# Patient Record
Sex: Male | Born: 1950 | Race: White | Hispanic: No | Marital: Married | Smoking: Current every day smoker
Health system: Southern US, Community
[De-identification: ages and names within clinical notes are randomized; demographics above are authoritative.]

## PROBLEM LIST (undated history)

## (undated) DIAGNOSIS — N2889 Other specified disorders of kidney and ureter: Secondary | ICD-10-CM

## (undated) DIAGNOSIS — J449 Chronic obstructive pulmonary disease, unspecified: Secondary | ICD-10-CM

## (undated) DIAGNOSIS — C6290 Malignant neoplasm of unspecified testis, unspecified whether descended or undescended: Secondary | ICD-10-CM

## (undated) DIAGNOSIS — R0600 Dyspnea, unspecified: Secondary | ICD-10-CM

## (undated) DIAGNOSIS — M1990 Unspecified osteoarthritis, unspecified site: Secondary | ICD-10-CM

## (undated) DIAGNOSIS — C7492 Malignant neoplasm of unspecified part of left adrenal gland: Secondary | ICD-10-CM

## (undated) DIAGNOSIS — T8859XA Other complications of anesthesia, initial encounter: Secondary | ICD-10-CM

## (undated) DIAGNOSIS — T4145XA Adverse effect of unspecified anesthetic, initial encounter: Secondary | ICD-10-CM

## (undated) DIAGNOSIS — G479 Sleep disorder, unspecified: Secondary | ICD-10-CM

## (undated) DIAGNOSIS — Z9289 Personal history of other medical treatment: Secondary | ICD-10-CM

## (undated) DIAGNOSIS — C649 Malignant neoplasm of unspecified kidney, except renal pelvis: Secondary | ICD-10-CM

## (undated) DIAGNOSIS — K5090 Crohn's disease, unspecified, without complications: Secondary | ICD-10-CM

## (undated) DIAGNOSIS — C3490 Malignant neoplasm of unspecified part of unspecified bronchus or lung: Secondary | ICD-10-CM

## (undated) DIAGNOSIS — C642 Malignant neoplasm of left kidney, except renal pelvis: Secondary | ICD-10-CM

## (undated) DIAGNOSIS — G4730 Sleep apnea, unspecified: Secondary | ICD-10-CM

## (undated) HISTORY — PX: HERNIA REPAIR: SHX51

## (undated) HISTORY — PX: APPENDECTOMY: SHX54

## (undated) HISTORY — DX: Malignant neoplasm of unspecified testis, unspecified whether descended or undescended: C62.90

## (undated) HISTORY — DX: Crohn's disease, unspecified, without complications: K50.90

## (undated) HISTORY — PX: COLONOSCOPY: SHX174

## (undated) HISTORY — DX: Malignant neoplasm of left kidney, except renal pelvis: C64.2

---

## 1998-01-06 DIAGNOSIS — C6290 Malignant neoplasm of unspecified testis, unspecified whether descended or undescended: Secondary | ICD-10-CM

## 1998-01-06 HISTORY — DX: Malignant neoplasm of unspecified testis, unspecified whether descended or undescended: C62.90

## 1998-01-06 HISTORY — PX: OTHER SURGICAL HISTORY: SHX169

## 1998-10-02 ENCOUNTER — Ambulatory Visit (HOSPITAL_COMMUNITY): Admission: RE | Admit: 1998-10-02 | Discharge: 1998-10-02 | Payer: Self-pay | Admitting: *Deleted

## 1998-10-23 ENCOUNTER — Encounter: Payer: Self-pay | Admitting: Urology

## 1998-10-24 ENCOUNTER — Ambulatory Visit (HOSPITAL_COMMUNITY): Admission: RE | Admit: 1998-10-24 | Discharge: 1998-10-24 | Payer: Self-pay | Admitting: Urology

## 1998-10-24 ENCOUNTER — Encounter (INDEPENDENT_AMBULATORY_CARE_PROVIDER_SITE_OTHER): Payer: Self-pay | Admitting: Specialist

## 1998-10-30 ENCOUNTER — Encounter: Payer: Self-pay | Admitting: Urology

## 1998-10-30 ENCOUNTER — Ambulatory Visit (HOSPITAL_COMMUNITY): Admission: RE | Admit: 1998-10-30 | Discharge: 1998-10-30 | Payer: Self-pay | Admitting: Urology

## 1998-11-12 ENCOUNTER — Encounter: Admission: RE | Admit: 1998-11-12 | Discharge: 1999-02-10 | Payer: Self-pay | Admitting: Radiation Oncology

## 1999-10-01 ENCOUNTER — Encounter (HOSPITAL_COMMUNITY): Admission: RE | Admit: 1999-10-01 | Discharge: 1999-12-30 | Payer: Self-pay | Admitting: *Deleted

## 1999-11-29 ENCOUNTER — Encounter: Payer: Self-pay | Admitting: Urology

## 1999-11-29 ENCOUNTER — Encounter: Admission: RE | Admit: 1999-11-29 | Discharge: 1999-11-29 | Payer: Self-pay | Admitting: Urology

## 2000-01-22 ENCOUNTER — Encounter: Admission: RE | Admit: 2000-01-22 | Discharge: 2000-01-22 | Payer: Self-pay | Admitting: *Deleted

## 2000-01-22 ENCOUNTER — Encounter: Payer: Self-pay | Admitting: *Deleted

## 2001-11-03 ENCOUNTER — Encounter: Payer: Self-pay | Admitting: Urology

## 2001-11-03 ENCOUNTER — Encounter: Admission: RE | Admit: 2001-11-03 | Discharge: 2001-11-03 | Payer: Self-pay | Admitting: Urology

## 2003-11-01 ENCOUNTER — Ambulatory Visit (HOSPITAL_COMMUNITY): Admission: RE | Admit: 2003-11-01 | Discharge: 2003-11-01 | Payer: Self-pay | Admitting: General Surgery

## 2003-11-01 ENCOUNTER — Ambulatory Visit (HOSPITAL_BASED_OUTPATIENT_CLINIC_OR_DEPARTMENT_OTHER): Admission: RE | Admit: 2003-11-01 | Discharge: 2003-11-01 | Payer: Self-pay | Admitting: General Surgery

## 2007-12-27 ENCOUNTER — Ambulatory Visit (HOSPITAL_COMMUNITY): Admission: RE | Admit: 2007-12-27 | Discharge: 2007-12-27 | Payer: Self-pay | Admitting: General Surgery

## 2008-02-07 ENCOUNTER — Inpatient Hospital Stay (HOSPITAL_COMMUNITY): Admission: EM | Admit: 2008-02-07 | Discharge: 2008-02-11 | Payer: Self-pay | Admitting: Emergency Medicine

## 2008-02-15 ENCOUNTER — Ambulatory Visit (HOSPITAL_COMMUNITY): Admission: RE | Admit: 2008-02-15 | Discharge: 2008-02-15 | Payer: Self-pay | Admitting: Surgery

## 2008-02-25 ENCOUNTER — Inpatient Hospital Stay (HOSPITAL_COMMUNITY): Admission: EM | Admit: 2008-02-25 | Discharge: 2008-02-29 | Payer: Self-pay | Admitting: Surgery

## 2008-02-28 ENCOUNTER — Ambulatory Visit: Payer: Self-pay | Admitting: Infectious Diseases

## 2008-04-17 ENCOUNTER — Encounter (INDEPENDENT_AMBULATORY_CARE_PROVIDER_SITE_OTHER): Payer: Self-pay | Admitting: Surgery

## 2008-04-17 ENCOUNTER — Ambulatory Visit (HOSPITAL_COMMUNITY): Admission: RE | Admit: 2008-04-17 | Discharge: 2008-04-18 | Payer: Self-pay | Admitting: Surgery

## 2009-06-25 ENCOUNTER — Encounter: Admission: RE | Admit: 2009-06-25 | Discharge: 2009-06-25 | Payer: Self-pay | Admitting: Gastroenterology

## 2009-07-02 ENCOUNTER — Inpatient Hospital Stay (HOSPITAL_COMMUNITY): Admission: EM | Admit: 2009-07-02 | Discharge: 2009-07-04 | Payer: Self-pay | Admitting: Emergency Medicine

## 2009-07-03 ENCOUNTER — Encounter (INDEPENDENT_AMBULATORY_CARE_PROVIDER_SITE_OTHER): Payer: Self-pay | Admitting: Internal Medicine

## 2009-09-14 ENCOUNTER — Ambulatory Visit (HOSPITAL_BASED_OUTPATIENT_CLINIC_OR_DEPARTMENT_OTHER): Admission: RE | Admit: 2009-09-14 | Discharge: 2009-09-14 | Payer: Self-pay | Admitting: Surgery

## 2009-12-03 ENCOUNTER — Ambulatory Visit (HOSPITAL_BASED_OUTPATIENT_CLINIC_OR_DEPARTMENT_OTHER): Admission: RE | Admit: 2009-12-03 | Payer: Self-pay | Admitting: Surgery

## 2009-12-10 ENCOUNTER — Ambulatory Visit (HOSPITAL_COMMUNITY)
Admission: RE | Admit: 2009-12-10 | Discharge: 2009-12-10 | Payer: Self-pay | Source: Home / Self Care | Admitting: Surgery

## 2010-01-28 ENCOUNTER — Encounter
Admission: RE | Admit: 2010-01-28 | Discharge: 2010-01-28 | Payer: Self-pay | Source: Home / Self Care | Attending: Orthopedic Surgery | Admitting: Orthopedic Surgery

## 2010-03-19 LAB — SURGICAL PCR SCREEN
MRSA, PCR: NEGATIVE
Staphylococcus aureus: POSITIVE — AB

## 2010-03-19 LAB — CBC
MCV: 89.8 fL (ref 78.0–100.0)
RDW: 15.7 % — ABNORMAL HIGH (ref 11.5–15.5)

## 2010-03-24 LAB — POCT I-STAT 3, ART BLOOD GAS (G3+)
Acid-Base Excess: 2 mmol/L (ref 0.0–2.0)
O2 Saturation: 93 %
pO2, Arterial: 65 mmHg — ABNORMAL LOW (ref 80.0–100.0)

## 2010-03-24 LAB — HEPATIC FUNCTION PANEL
AST: 14 U/L (ref 0–37)
Bilirubin, Direct: <0.1 mg/dL (ref 0.0–0.3)
Total Bilirubin: 0.6 mg/dL (ref 0.3–1.2)

## 2010-03-24 LAB — PROTIME-INR
INR: 1.08 (ref 0.00–1.49)
Prothrombin Time: 13.9 seconds (ref 11.6–15.2)

## 2010-03-24 LAB — CROSSMATCH: Antibody Screen: NEGATIVE

## 2010-03-24 LAB — CBC
HCT: 19.4 % — ABNORMAL LOW (ref 39.0–52.0)
MCH: 30.9 pg (ref 26.0–34.0)
MCV: 92.3 fL (ref 78.0–100.0)
MCV: 93.4 fL (ref 78.0–100.0)
Platelets: 352 10*3/uL (ref 150–400)
Platelets: 366 10*3/uL (ref 150–400)
RDW: 15.8 % — ABNORMAL HIGH (ref 11.5–15.5)
RDW: 16.1 % — ABNORMAL HIGH (ref 11.5–15.5)
WBC: 9 10*3/uL (ref 4.0–10.5)
WBC: 9.4 10*3/uL (ref 4.0–10.5)

## 2010-03-24 LAB — DIFFERENTIAL
Basophils Absolute: 0 10*3/uL (ref 0.0–0.1)
Basophils Absolute: 0 10*3/uL (ref 0.0–0.1)
Basophils Relative: 0 % (ref 0–1)
Eosinophils Absolute: 0.1 10*3/uL (ref 0.0–0.7)
Eosinophils Absolute: 0.3 10*3/uL (ref 0.0–0.7)
Eosinophils Relative: 1 % (ref 0–5)
Eosinophils Relative: 3 % (ref 0–5)
Monocytes Absolute: 0.7 10*3/uL (ref 0.1–1.0)
Monocytes Relative: 6 % (ref 3–12)
Neutro Abs: 10.7 10*3/uL — ABNORMAL HIGH (ref 1.7–7.7)
Neutrophils Relative %: 77 % (ref 43–77)

## 2010-03-24 LAB — BASIC METABOLIC PANEL
BUN: 5 mg/dL — ABNORMAL LOW (ref 6–23)
BUN: 6 mg/dL (ref 6–23)
Calcium: 8 mg/dL — ABNORMAL LOW (ref 8.4–10.5)
Calcium: 8.6 mg/dL (ref 8.4–10.5)
Chloride: 103 mEq/L (ref 96–112)
Chloride: 98 mEq/L (ref 96–112)
Creatinine, Ser: 0.87 mg/dL (ref 0.4–1.5)
Creatinine, Ser: 0.92 mg/dL (ref 0.4–1.5)
GFR calc Af Amer: >60 mL/min (ref >60)
GFR calc Af Amer: >60 mL/min (ref >60)
GFR calc non Af Amer: >60 mL/min (ref >60)
Potassium: 4.2 mEq/L (ref 3.5–5.1)
Sodium: 137 mEq/L (ref 135–145)

## 2010-03-24 LAB — PREPARE RBC (CROSSMATCH)

## 2010-03-24 LAB — APTT: aPTT: 26 seconds (ref 24–37)

## 2010-03-24 LAB — ABO/RH: ABO/RH(D): O NEG

## 2010-04-17 LAB — HEMOGLOBIN AND HEMATOCRIT, BLOOD
HCT: 38 % — ABNORMAL LOW (ref 39.0–52.0)
Hemoglobin: 12.1 g/dL — ABNORMAL LOW (ref 13.0–17.0)

## 2010-04-23 LAB — BASIC METABOLIC PANEL
BUN: 4 mg/dL — ABNORMAL LOW (ref 6–23)
CO2: 28 mEq/L (ref 19–32)
CO2: 26 mEq/L (ref 19–32)
CO2: 28 mEq/L (ref 19–32)
Calcium: 8.6 mg/dL (ref 8.4–10.5)
Calcium: 9.4 mg/dL (ref 8.4–10.5)
Chloride: 100 mEq/L (ref 96–112)
Chloride: 103 mEq/L (ref 96–112)
Creatinine, Ser: 0.85 mg/dL (ref 0.4–1.5)
Creatinine, Ser: 0.97 mg/dL (ref 0.4–1.5)
Glucose, Bld: 112 mg/dL — ABNORMAL HIGH (ref 70–99)
Glucose, Bld: 110 mg/dL — ABNORMAL HIGH (ref 70–99)
Glucose, Bld: 97 mg/dL (ref 70–99)
Sodium: 136 mEq/L (ref 135–145)

## 2010-04-23 LAB — DIFFERENTIAL
Basophils Absolute: 0 10*3/uL (ref 0.0–0.1)
Eosinophils Absolute: 0.1 10*3/uL (ref 0.0–0.7)
Eosinophils Absolute: 0.4 10*3/uL (ref 0.0–0.7)
Eosinophils Absolute: 0.2 10*3/uL (ref 0.0–0.7)
Eosinophils Relative: 1 % (ref 0–5)
Eosinophils Relative: 1 % (ref 0–5)
Lymphocytes Relative: 9 % — ABNORMAL LOW (ref 12–46)
Lymphocytes Relative: 10 % — ABNORMAL LOW (ref 12–46)
Lymphocytes Relative: 5 % — ABNORMAL LOW (ref 12–46)
Lymphs Abs: 1 10*3/uL (ref 0.7–4.0)
Lymphs Abs: 0.8 10*3/uL (ref 0.7–4.0)
Lymphs Abs: 0.8 10*3/uL (ref 0.7–4.0)
Monocytes Absolute: 0.7 10*3/uL (ref 0.1–1.0)
Monocytes Absolute: 0.7 10*3/uL (ref 0.1–1.0)
Monocytes Relative: 6 % (ref 3–12)
Neutrophils Relative %: 81 % — ABNORMAL HIGH (ref 43–77)
Neutrophils Relative %: 87 % — ABNORMAL HIGH (ref 43–77)
Neutrophils Relative %: 79 % — ABNORMAL HIGH (ref 43–77)

## 2010-04-23 LAB — URINALYSIS, ROUTINE W REFLEX MICROSCOPIC
Ketones, ur: NEGATIVE mg/dL
Nitrite: NEGATIVE
Protein, ur: NEGATIVE mg/dL
Specific Gravity, Urine: 1.028 (ref 1.005–1.030)
Urobilinogen, UA: 1 mg/dL (ref 0.0–1.0)

## 2010-04-23 LAB — CBC
HCT: 29.7 % — ABNORMAL LOW (ref 39.0–52.0)
HCT: 34.1 % — ABNORMAL LOW (ref 39.0–52.0)
HCT: 33.2 % — ABNORMAL LOW (ref 39.0–52.0)
Hemoglobin: 10.6 g/dL — ABNORMAL LOW (ref 13.0–17.0)
Hemoglobin: 10.5 g/dL — ABNORMAL LOW (ref 13.0–17.0)
MCHC: 30.1 g/dL (ref 30.0–36.0)
MCHC: 30.2 g/dL (ref 30.0–36.0)
MCHC: 31.1 g/dL (ref 30.0–36.0)
MCHC: 31.7 g/dL (ref 30.0–36.0)
MCV: 69.1 fL — ABNORMAL LOW (ref 78.0–100.0)
MCV: 71.2 fL — ABNORMAL LOW (ref 78.0–100.0)
MCV: 70.1 fL — ABNORMAL LOW (ref 78.0–100.0)
MCV: 70.7 fL — ABNORMAL LOW (ref 78.0–100.0)
Platelets: 535 10*3/uL — ABNORMAL HIGH (ref 150–400)
Platelets: 519 10*3/uL — ABNORMAL HIGH (ref 150–400)
Platelets: 608 10*3/uL — ABNORMAL HIGH (ref 150–400)
RBC: 4.18 MIL/uL — ABNORMAL LOW (ref 4.22–5.81)
RBC: 4.3 MIL/uL (ref 4.22–5.81)
RBC: 4.93 MIL/uL (ref 4.22–5.81)
RDW: 19 % — ABNORMAL HIGH (ref 11.5–15.5)
RDW: 18.9 % — ABNORMAL HIGH (ref 11.5–15.5)
WBC: 10.6 10*3/uL — ABNORMAL HIGH (ref 4.0–10.5)
WBC: 12.6 10*3/uL — ABNORMAL HIGH (ref 4.0–10.5)
WBC: 16.7 10*3/uL — ABNORMAL HIGH (ref 4.0–10.5)
WBC: 8.6 10*3/uL (ref 4.0–10.5)

## 2010-04-23 LAB — COMPREHENSIVE METABOLIC PANEL
ALT: 13 U/L (ref 0–53)
CO2: 26 mEq/L (ref 19–32)
Calcium: 9.1 mg/dL (ref 8.4–10.5)
GFR calc non Af Amer: >60 mL/min (ref >60)
Glucose, Bld: 118 mg/dL — ABNORMAL HIGH (ref 70–99)
Sodium: 134 mEq/L — ABNORMAL LOW (ref 135–145)

## 2010-04-23 LAB — CULTURE, ROUTINE-ABSCESS

## 2010-05-21 NOTE — Op Note (Signed)
NAMEBRYDAN, DOWNARD              ACCOUNT NO.:  [id number]   MEDICAL RECORD NO.:  [id number]          PATIENT TYPE:  AMB   LOCATION:  SDS                          FACILITY:  [facility]   PHYSICIAN:  Edsel Petrin. Dalbert Batman, M.D.DATE OF BIRTH:  07-Sep-1950   DATE OF PROCEDURE:  12/27/2007  DATE OF DISCHARGE:                               OPERATIVE REPORT   PREOPERATIVE DIAGNOSIS:  Recurrent ventral hernia.   POSTOPERATIVE DIAGNOSIS:  Recurrent, incarcerated ventral hernia.   OPERATION PERFORMED:  Laparoscopic repair of recurrent, incarcerated  ventral incisional hernia with Parietex mesh.   SURGEON:  Edsel Petrin. Dalbert Batman, MD   OPERATIVE INDICATIONS:  This is a 60 year old white man who underwent  open repair of an umbilical hernia 4 or 5 years ago.  This recurred a  year or two ago and has been getting larger.  He wants to have this  repaired before it gets any larger.  It is more painful now than it was  a year ago.  On exam, he has a transverse incision below the umbilicus  and a bulge above the umbilicus, which is probably 5 cm in size when he  stands.  This is partly reducible.  He is brought to the operating room  electively.   OPERATIVE FINDINGS:  The patient had 2 or 3 small defects at the  umbilicus and superior to the umbilicus.  There was omentum incarcerated  to all of these defects, which required cautery and scissor dissection  to reduce.  Ultimately, all of the omentum was reduced.  There were no  other abnormalities noted.   OPERATIVE TECHNIQUE:  Following the induction of general endotracheal  anesthesia, the abdomen was prepped and draped in a sterile fashion.  The patient was identified as the correct patient, correct procedure,  and a surgical time-out was held.  Intravenous antibiotics were given.  A 0.25% Marcaine with epinephrine was used as a local infiltration  anesthetic.  A 5-mm optical port was placed in the left subcostal region  under direct vision.  This was  uneventful and atraumatic.  Pneumoperitoneum was created.  A camera was inserted with visualization  and findings as described above.  A 5-mm trocar was placed in the left  lower quadrant and an 11-mm trocar placed in the left flank.  Using  traction, countertraction, and scissor cautery, we debrided the omentum  out of the hernia defects.  We checked for hemostasis and there was no  bleeding.  We used a spinal needle to mark the edges of the hernia  defects and this appeared to be about 4 or 5 cm utmost.  We chose to use  a 12-mm circular piece of Parietex composite mesh.  This was brought to  the operative field.  Using a marking pen, we marked a template on the  abdominal wall where the mesh would be secured, and we marked 6  equidistant points around the perimeter of the mesh for suture fixation.  We used a #1 Novofil and placed the sutures at the 6 equidistant points  around the perimeter of the mesh.  We were very careful  to place these  sutures and tied these down on the rough side of the mesh keeping the  smooth side down toward the viscera.  The mesh was then moistened,  rolled up, and inserted.  Once inside the peritoneal cavity, we spread  the mesh out and oriented it according to the template.  We made small  puncture wounds at the 6 equidistant points and drew the Novofil sutures  up through the abdominal wall, being careful to take about 1-cm bite  fascia.  Once all of these were placed, we lifted them up and spread the  mesh out nicely without any redundancy or folding and then we tied all  of these sutures.  We used a 5-mm titanium tacker to tack the edge of  the mesh.  We made sure that these tacks were near the edge of the mesh  and were no further than 1 cm apart.  We placed a few tacks in the  center of the mesh for fixation.  This was inspected from all of the  port sites with an angled camera and it appeared that we had good  fixation without any defect.  There was no  bleeding from the mesh  fixation.  We used an Endoclose to close the fascia at the left flank,  an 11-mm trocar site.  We then released the pneumoperitoneum and removed  the trocars.  All of the skin incisions were closed with subcuticular  sutures of 4-0 Monocryl and Dermabond.  Clean bandages were placed.  The  patient was taken to the recovery room in stable condition.  Estimated  blood loss was about 10-20 mL.  Complications none.  Sponge, needle, and  instrument counts were correct.      Edsel Petrin. Dalbert Batman, M.D.  Electronically Signed     HMI/MEDQ  D:  12/27/2007  T:  12/28/2007  Job:  [id number]   cc:   Candace Gallus, M.D.  Lear Ng, MD

## 2010-05-21 NOTE — Discharge Summary (Signed)
NAMEEULAN, HEYWARD              ACCOUNT NO.:  [id number]   MEDICAL RECORD NO.:  [id number]           PATIENT TYPE:   LOCATION:                                 FACILITY:   PHYSICIAN:  Edsel Petrin. Dalbert Batman, M.D.DATE OF BIRTH:  12-Mar-1950   DATE OF ADMISSION:  DATE OF DISCHARGE:                               DISCHARGE SUMMARY   DISCHARGING PHYSICIAN:  Edsel Petrin. Dalbert Batman, M.D.   PROCEDURES:  None.   CONSULTANTS:  Dr. Alison Murray, M.D., with Infectious Disease.   REASON FOR ADMISSION:  Mr. Go is a 60 year old white male with a  history of Crohn's disease as well as a perforated appendix that was  seen on our service on February 07, 2008.  At that time, he was treated  with medical management, and later sent home after a fairly uneventful  hospital stay.  During this time at home, the patient did have a repeat  CT scan that showed an increasing size of his appendiceal abscess and  therefore at this time this was aspirated by Interventional Radiology.  Cultures came back showing candida in this abscess.   At this time, the patient was continued on Cipro and Flagyl.  However,  on the February 25, 2008, the patient had an acute onset of increasing  abdominal pain and came back to the emergency department.  At this time,  a repeat CT scan was obtained, which showed a recurrence of his  appendiceal abscess now measuring 2.2 cm, and inflammatory changes  around his cecum as well as terminal ileum.  At that time, he was  admitted and started on Zosyn.  Please see admitting history and  physical for further details.   ADMITTING DIAGNOSES:  1. Abdominal pain secondary to the patient's known perforated      appendicitis.  2. Known perforated appendicitis from February 07, 2008.  3. Crohn's disease.  4. Recent history of ventral hernia repair with mesh.  5. Tobacco abuse.   HOSPITAL COURSE:  As stated earlier, the patient was admitted to the  hospital and started on Zosyn as well as  n.p.o.  At this time, due to  the patient's recent laparoscopic ventral hernia repair with mesh, we  continued to want to try and treat the patient with conservative  management.  Therefore, he was placed on antibiotics for the duration of  his hospitalization.  However, it was noted that the patient's culture  from his abscess aspirate did reveal Candida yeast, and therefore we  consulted Infectious Disease to make sure we were treating the patient  with the correct antibiotics.  At this time, we discontinued the  patient's Zosyn and started the patient on Diflucan.  Dr. Orene Desanctis with  Infectious Disease were communicated at this time that the patient be  put on 400 mg of Diflucan per day for the next 4 weeks with a repeat CT  scan in 4 weeks, and to follow up on the patient's intraabdominal  abscess.   Also during the hospitalization, the patient's diet was increased and by  the end of the hospitalization he was tolerating a  regular diet.  Also  of note on admission, the patient's white count was elevated at 16,700.  By hospital day 2, the patient's white count had normalized to 8600.  By  hospital day 4, the patient was doing well.  His pain was decreased, and  at this time it was felt stable for discharge to home.   DISCHARGE DIAGNOSES:  1. Perforated appendicitis with intraabdominal appendiceal abscess      with cultures growing Candida yeast.  2. Crohn's disease, which is currently stable.  3. Recent history of laparoscopic ventral hernia repair with mesh.  4. Tobacco abuse.   DISCHARGE MEDICATIONS:  The patient is informed that he may resume all  home medications including:  1. Asacol 1600 mg daily.  2. Nexium 40 mg daily.  3. Naproxen sodium as needed.  4. He is to discontinue his use of Cipro and Flagyl as well as his      Percocet 7.5/325.  5. He is given a new prescription for oxycodone 5 mg 1-2 q.4 h. as      needed for pain per his request to take the Tylenol out of  this.  6. He was also given a prescription for Diflucan 400 mg 1 p.o. daily      x4 weeks.   DISCHARGE INSTRUCTIONS:  At this time, the patient is informed that he  may return to work when he feels stable.  He does not have any dietary  restrictions or activity restrictions.  He is to follow up with Dr.  Hassell Done in our office in approximately 2 weeks.  Otherwise, he is to  continue taking his Diflucan 400 mg for the next 4 weeks.  Once this  prescription has run out, then he is to get a repeat CT scan in 4 weeks  to follow up with this appendiceal abscess.      Henreitta Cea, PA      [place]. Dalbert Batman, M.D.  Electronically Signed    KEO/MEDQ  D:  02/29/2008  T:  02/29/2008  Job:  [id number]   cc:   Isabel Caprice Hassell Done, MD  Alison Murray, M.D.  Leitha Bleak, M.D.

## 2010-05-21 NOTE — Op Note (Signed)
[name], [name]              ACCOUNT NO.:  [id number]   MEDICAL RECORD NO.:  [id number]          PATIENT TYPE:  OIB   LOCATION:  [place]                         FACILITY:  [facility]   PHYSICIAN:  Isabel Caprice. Hassell Done, MD  DATE OF BIRTH:  08-20-1950   DATE OF PROCEDURE:  04/17/2008  DATE OF DISCHARGE:                               OPERATIVE REPORT   PREOPERATIVE DIAGNOSIS:  Perforated appendix.  Admitted on February 1  with a perforated appendicitis, managed with a percutaneous and drainage  antibiotics.   PROCEDURE:  Laparoscopic appendectomy with takedown of ruptured  appendix, enterolysis.   SURGEON:  Matt B. Hassell Done, MD, FACS   ASSISTANT:  None.   ANESTHESIA:  General endotracheal.   DESCRIPTION OF PROCEDURE:  [name] had a previous laparoscopic  ventral hernia repair earlier this year.  Six weeks out from his  surgery, he had a ruptured appendix.  He was managed with IV antibiotics  and percutaneous drainage and is brought back at this time for interval  appendectomy for his ruptured appendix.   SURGEON:  Hassell Done.   ANESTHESIA:  General.   The patient was taken to room 1 on Monday, April 17, 2008, and given  general anesthesia.  Preoperatively, he received Mefoxin.  I entered the  abdomen through the left upper quadrant with a 10 OptiView approach  without difficulty.  The abdomen It was insufflated, and then I  visualized very pronounced adhesions to this Parietex mesh.  It was  completely covered.  Anticipating potential purulence, I elected to  leave those adhesions in place.  I worked around them, nd placing a 5 in  the right upper quadrant and a 11/12 placed very obliquely in the left  lower quadrant.  Through these, I began a tedious dissection to take  down this inflammatory mass using scissors to carve it out and then  identified the appendiceal stump which I then divided with a blue load  Endo-GIA.  I used the harmonic a little bit but mainly scissors to  free  it up from the terminal ileum and from a loop of small bowel that made  it part of the abscess cavity.  Finally when removed, I was able to  separate all the structures and actually went back and took down the  adhesions between the bowel as this would likely be a site for potential  internal hernia.  Everything was lysed, and the appendix with a bag and  brought out without difficulty.  I inspected the integrity of bowel and  saw no evidence of perforation, again looking and probing and  irrigating.  No evidence of bleeding was noted.  Once  evacuation of the irrigant was done, I injected all of the port sites  with Marcaine and then withdrew the gas and the trocars and closed  wounds with 4-0 Vicryl.  The patient seemed to tolerate procedure well  and was taken to the recovery room in satisfactory condition.      Isabel Caprice Hassell Done, MD  Electronically Signed     MBM/MEDQ  D:  04/17/2008  T:  04/17/2008  Job:  [id number]   cc:   Leitha Bleak, M.D.  Fax: [phone]

## 2010-05-21 NOTE — H&P (Signed)
[name], [name]              ACCOUNT NO.:  [id number]   MEDICAL RECORD NO.:  [id number]          PATIENT TYPE:  INP   LOCATION:  5121                         FACILITY:  [place]   PHYSICIAN:  Marcello Moores A. Cornett, M.D.DATE OF BIRTH:  [birth year]   DATE OF ADMISSION:  02/25/2008  DATE OF DISCHARGE:                              HISTORY & PHYSICAL   PRIMARY CARE PHYSICIAN:  Leitha Bleak, M.D.   ADMITTING SURGEON:  Marcello Moores A. Cornett, M.D.   CHIEF COMPLAINT:  Right lower quadrant abdominal pain.   HISTORY OF PRESENT ILLNESS:  [name] is a 60 year old white male with  a history of Crohn disease as well as perforated appendicitis that is  well known to our Service.  He was admitted to [facility] on  February 07, 2008, after finding that the patient had a perforated  appendicitis.  At this time, the patient was treated conservatively with  antibiotics.  His hospital stay was uneventful and later discharged  home.  Throughout that hospitalization, it was found that the patient  never had an abscess in the right lower quadrant big enough for a  percutaneous drain to be placed.  However, the patient did followup with  Dr. Hassell Done in the office and on February 9, had a repeat CT scan, which  showed a 3-cm abscess in the right lower quadrant and at this time, this  was aspirated.  Culture was taken and it was found to grow yeast.  But  this time, the patient was switched from Augmentin to Cipro and Flagyl.  Of note, the patient did just have a laparoscopic ventral hernia repair  with mesh done by Dr. Fanny Skates in December 2009.  The patient did  return to see Dr. Hassell Done in followup yesterday and began complaining of  an increasing amount of pain.  However, last night the patient had an  acute increase in abdominal pain.  The patient states that his pain is  currently throbbing, and is very severe.  He has had some nausea, and  had multiple bouts of emesis last night.  He does  admit to having some  chills, but no known fevers, or sweats.  He does admit though to having  mostly diarrhea lately with no formed bowel movements.  Otherwise, he  denies chest pain, shortness of breath.   REVIEW OF SYSTEMS:  Please see HPI.  Otherwise, currently all other  systems are negative.   FAMILY HISTORY:  Noncontributory.   PAST MEDICAL HISTORY:  1. Crohn disease.  2. Recent history of perforated appendicitis, which is being treated      conservatively.  3. Tobacco abuse.   PAST SURGICAL HISTORY:  1. Supraumbilical hernia repair with mesh by Dr. Dalbert Batman on December 27, 2007.  2. Umbilical hernia repair approximately 5 years ago.  3. 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   SOCIAL HISTORY:  The patient is married.  He has 2 children.  He is an  Chief Financial Officer.  He does smoke 1 pack of  cigarettes a day and drinks  approximately 2 beers per day.   ALLERGIES:  NKDA.   MEDICATIONS:  1. Asacol 1600 mg daily.  2. Cipro 500 mg p.o. b.i.d.  3. Flagyl 500 mg p.o. t.i.d.  4. Percocet 7.5/325 mg 1-2 q.4 h. p.r.n. pain.   PHYSICAL EXAMINATION:  GENERAL:  [name] is an 60 year old white male  who is very pleasant and is currently lying in bed in no obvious  distress.  VITAL SIGNS:  Temperature 98, pulse 90, respirations 18, and blood  pressure 118/78.  HEENT:  Head is normocephalic and atraumatic.  Sclerae noninjected.  Pupils are equal, round, and reactive to light.  Ears and nose without  any obvious masses or lesions.  No rhinorrhea.  Mouth is pink and  somewhat dry.  Throat shows no exudate.  The patient is currently  wearing glasses.  NECK:  Supple.  Trachea is midline.  No thyromegaly.  HEART:  Regular rate and rhythm.  Normal S1, S2.  No murmurs, gallops,  or rubs are noted.  A +2 carotid, radial, and pedal pulses bilaterally.  LUNGS:  Clear to auscultation bilaterally with no wheezes, rhonchi, or  rales noted.   Respiratory effort is nonlabored.  ABDOMEN:  Soft, exquisite tenderness in the right lower quadrant with  active guarding and rebounding in this quadrant.  Otherwise, all other  quadrants are essentially nontender.  The patient is nondistended and  does have active bowel sounds.  He does still have obvious scars from  his prior umbilical hernia repair as well as laparoscopic ventral hernia  repair.  No other obvious hernias, or masses  were noted.  MUSCULOSKELETAL:  All 4 extremities are symmetrical, no cyanosis,  clubbing, or edema.  SKIN:  Warm and dry without any obvious rashes, lesions, or masses.  NEUROLOGIC:  Cranial nerves II through XII appeared to be grossly  intact.  PSYCHIATRIC:  The patient is alert and oriented x3 with an appropriate  affect.   LABORATORIES AND DIAGNOSTICS:  White blood cell count is 16,700,  hemoglobin is 10.2.  Currently all other labs are pending and I do not  have any other labs in front of me as the EDP informed me of these 2  labs.  Also, currently a CT scan of the abdomen and pelvis is pending.   IMPRESSION:  1. Abdominal pain, probably secondary to the patient's known      perforated appendicitis.  2. Known perforated appendicitis from February 07, 2008, that was      treated medically.  3. Crohn disease, which is currently stable.  4. Recent history of ventral hernia repair with mesh.  5. Tobacco abuse.   PLAN:  At this time, we will admit the patient and start him on IV  fluids as well as IV Zosyn.  Currently, we will await for pending CT  scan as well as labs to be done.  In the meantime, we will keep the  patient n.p.o.  He will be currently treated medically with IV  antibiotics.  However, once the CT scan is completed if it shows  otherwise, then we will proceed as determined by Dr. Brantley Stage at that  time.      Henreitta Cea, PA      Marcello Moores A. Cornett, M.D.  Electronically Signed    KEO/MEDQ  D:  02/25/2008  T:  02/26/2008   Job:  [id number]   cc:   Leitha Bleak, M.D.  Lear Ng, MD

## 2010-05-21 NOTE — Discharge Summary (Signed)
[name], [name]              ACCOUNT NO.:  [id number]   MEDICAL RECORD NO.:  [id number]           PATIENT TYPE:   LOCATION:                                 FACILITY:   PHYSICIAN:  Isabel Caprice. Hassell Done, MD  DATE OF BIRTH:  1950/12/01   DATE OF ADMISSION:  02/07/2008  DATE OF DISCHARGE:  02/11/2008                               DISCHARGE SUMMARY   DISCHARGING PHYSICIAN:  Rodman Key B. Hassell Done, MD.   CONSULTANTS:  None.   PROCEDURES:  None.   REASON FOR ADMISSION:  [name] is a 60 year old white male who began  having right lower quadrant abdominal pain this last Friday.  He  developed some slight nausea on Saturday.  However, due to his  continuing right lower quadrant pain, he presented to the emergency  department on Monday.  At that time, he had a CT scan which showed  perforated appendicitis.  Please see admitting history and physical for  further details.   ADMITTING DIAGNOSES:  1. Perforated appendicitis.  2. History of Crohn disease.  3. Leukocytosis.  4. Recent ventral hernia repair with mesh.   HOSPITAL COURSE:  At this time, the patient was admitted and started on  IV Unasyn as well as various pain medications and IV fluids.  Throughout  his hospitalization, he was admitted on clear liquids.  He continued on  Unasyn for most of the hospitalization as well.  He was brought in for  observation.  A repeat CT scan was obtained on February 3.  This showed  no real change in his appendiceal phlegmon and abscess and therefore,  Interventional Radiology was unable to drain this.  Therefore, he just  stayed on his Unasyn.  By hospital day #4, the patient's white blood  cell count was now almost normal at 10,600.  He was tolerating at this  time a regular diet as well as p.o. Augmentin.  At this time, he was  felt stable for discharge home.   DISCHARGE DIAGNOSES:  1. Perforated appendicitis which has improved.  2. History of Crohn disease.  3. Recent supraumbilical  hernia repair with mesh.   DISCHARGE MEDICATIONS:  The patient may resume all home medications  including:  1. Asacol 1600 mg daily.  2. Naproxen daily p.r.n.   NEW PRESCRIPTIONS:  1. Augmentin 875 mg one p.o. b.i.d. for at least 14 days or until      followup with Dr. Hassell Done, and he tells him otherwise.  2. Percocet 5/325 as needed for pain.   DISCHARGE INSTRUCTIONS:  At this time, [name] is informed that he  has no dietary restrictions.  He has no activity restrictions and he may  shower.  He is to return to see Dr. Hassell Done in followup in approximately  1 week.  At this time, per Dr.  Earlie Server evaluation, the patient may  need a further followup CT to reassess this area.  Otherwise, he will be  kept on antibiotics until he sees Dr. Hassell Done, and we will plan for an  interval appendectomy within 6 weeks to 8 weeks.  Otherwise, he is to  call our office for fever greater than 101.5 or worsening abdominal  pain.      Henreitta Cea, PA      Isabel Caprice Hassell Done, MD  Electronically Signed    KEO/MEDQ  D:  02/11/2008  T:  02/12/2008  Job:  [id number]

## 2010-05-21 NOTE — H&P (Signed)
NAMEJAMIN, [name]              ACCOUNT NO.:  [id number]   MEDICAL RECORD NO.:  [id number]          PATIENT TYPE:  INP   LOCATION:  5128                         FACILITY:  [facility]   PHYSICIAN:  Isabel Caprice. Hassell Done, MD  DATE OF BIRTH:  01-31-1950   DATE OF ADMISSION:  02/07/2008  DATE OF DISCHARGE:                              HISTORY & PHYSICAL   PRIMARY CARE PHYSICIAN:  Leitha Bleak, MD   GI PHYSICIAN:  Lear Ng, MD   REASON FOR ADMISSION:  Right lower quadrant abdominal pain.   HISTORY OF PRESENT ILLNESS:  Mr. Som is a 60 year old white male with  a history of Crohn disease.  He began having right lower quadrant  abdominal pain, this past Friday night.  He states that he had some  slight nausea on Saturday; however, he thinks this was secondary to  something he ate because approximately 2 hours later, he ate [place]  without any problems.  Since then he has had no further problems eating  or drinking.  He has not  had any loss of appetite.  He has been having  normal bowel movements with his last one being this morning.  He states  that the pain has remained consistent in the right lower quadrant and  has not worsened or improved since then.  At this time because of this  continuing pain, he presented to the emergency department this morning.  At that time, CT of the abdomen and pelvis was done, which showed  appendiceal wall thickening up to 1.7 cm with inflammatory changes  surrounding this.  It also showed a small focal perforation with  periappendiceal gas measuring 1.8 cm with a small appendiculis.  At this  time, because of appendicitis, we will call to this patient.   REVIEW OF SYSTEMS:  Please see HPI, otherwise all other systems are  currently negative.   FAMILY HISTORY:  Noncontributory.   PAST MEDICAL HISTORY:  1. Crohn disease.  2. Tobacco abuse.   PAST SURGICAL HISTORY:  1. Supraumbilical hernia repair with mesh by Dr. Dalbert Batman on December 27, 2007.  2. Umbilical hernia repair approximately 5 years ago.  3. 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   SOCIAL HISTORY:  The patient is married.  He has 2 children, 1 son and 1  girl.  He is an Chief Financial Officer.  He does admit smoking approximately a pack of  cigarettes per day as well as drinking approximately 2 beers per day.   ALLERGIES:  NKDA.   MEDICATIONS:  1. Asacol 400 mg 4 pills a day.  2. Nexium 40 mg daily.  3. Naproxen sodium approximately 2-4 pills per day.   PHYSICAL EXAMINATION:  GENERAL:  Mr. Bialy is an 60 year old white male  who was very pleasant and currently lying in bed in no acute distress.  VITAL SIGNS:  Temperature 97.6, pulse 90, respirations 20, and blood  pressure 105/62.  HEENT:  Head is normocephalic and atraumatic.  Sclerae noninjected.  Pupils are equal, round, and reactive to light.  The patient is wearing  glasses.  Ears and nose  did not show any obvious masses or lesions.  No  rhinorrhea.  Mouth is pink and moist.  Throat shows no exudate.  NECK:  Supple.  Trachea is midline.  No thyromegaly.  HEART:  Regular rate and rhythm.  Normal S1, S2.  No murmurs, gallops,  or rubs are noted.  +2 carotid, radial, and pedal pulses bilaterally.  LUNGS:  Clear to auscultation bilaterally, but no wheezes, rhonchi, or  rales noted.  Respiratory effort is nonlabored.  ABDOMEN:  Soft, tender in the right lower quadrant region with active  bowel sounds, but is nondistended.  He does have multiple tiny  laparoscopic scars from his laparoscopic ventral hernia repair that he  just had done approximately 1 month ago.  Otherwise, currently the  patient is not guarding and does not have any peritoneal signs.  MUSCULOSKELETAL:  All 4 extremities are symmetrical with no cyanosis,  clubbing, or edema.  SKIN:  Warm and dry without any obvious rashes, lesions, or masses.  NEURO:  Cranial nerves II through XII appeared to be grossly intact.  PSYCH:  The  patient is alert and oriented x3 with an appropriate affect.   LABORATORIES AND DIAGNOSTICS:  White blood cell count 14,900, hemoglobin  10.6, hematocrit 34.1, and platelet count is 560,000.  Neutrophils are  87%.  Sodium 134, potassium 4.1, glucose 118, BUN 15, creatinine 0.80.  CT of the abdomen and pelvis show appendiceal thickening of 1.7 cm with  inflammatory changes surrounding this.  There is a small focal  perforation of the appendix with periappendiceal gas measuring 1.8 cm  with a small appendiculis.   IMPRESSION:  1. Perforated appendicitis.  2. History of Crohn disease, which is currently stable.  3. Recent history of ventral hernia repair with mesh.  4. Tobacco abuse.   PLAN:  At this time, we will admit the patient.  Since the patient  already has an abscess forming, we will place the patient on IV Unasyn.  We will also possibly consult Interventional Radiology for percutaneous  drain of the developing abscess.  At this time, we will allow the  patient to cool down with IV antibiotics, controls pain with pain  meds and then hopefully allow the patient to be treated conservatively  and then come back in approximately 6 weeks to 2 months for an interval  appendectomy.  Otherwise, at this time, we will repeat labs in the  morning and allow the patient to have IV fluids with clear liquid diet  at this time and any other p.r.n. medications as needed.      Henreitta Cea, PA      Isabel Caprice Hassell Done, MD  Electronically Signed    KEO/MEDQ  D:  02/07/2008  T:  02/08/2008  Job:  [id number]   cc:   Lear Ng, MD  Leitha Bleak, M.D.

## 2010-05-24 NOTE — Op Note (Signed)
NAMEEZEKIAH, MASSIE              ACCOUNT NO.:  [id number]   MEDICAL RECORD NO.:  [id number]          PATIENT TYPE:  AMB   LOCATION:  [facility]                         FACILITY:  [facility]   PHYSICIAN:  Edsel Petrin. Dalbert Batman, M.D.DATE OF BIRTH:  [birth year]   DATE OF PROCEDURE:  11/01/2003  DATE OF DISCHARGE:                                 OPERATIVE REPORT   PREOPERATIVE DIAGNOSES:  Incarcerated umbilical hernia.   POSTOPERATIVE DIAGNOSES:  Incarcerated umbilical hernia.   OPERATION PERFORMED:  Repair incarcerated umbilical hernia.   SURGEON:  Edsel Petrin. Dalbert Batman, M.D.   INDICATIONS FOR PROCEDURE:  This is a 60 year old white man who has had an  umbilical hernia for about one year. It has been getting larger. He is  having to forcibly reduce it. It is minimally tender. On exam, he has an  umbilical hernia protruding with a defect about 1.5 to 2.0 cm.  I can  forcibly reduce most of this. He is brought to the operating room  electively.   DESCRIPTION OF PROCEDURE:  Following the induction of an LMA anesthetic, the  patient's abdomen was prepped and draped in a sterile fashion.  He was given  intravenous Ancef. Marcaine 0.5% with epinephrine was used as a local  infiltration anesthetic. A curved transverse incision was made just below  the lower rim of the umbilicus.  Dissection was carried down through the  subcutaneous tissue. The anterior abdominal wall fascia around the umbilicus  was exposed. I dissected the umbilical skin away from the umbilical hernia.  I cleaned the subcutaneous tissue off around the rims of the hernia defect.  I found that there was some omentum incarcerated in the hernia defect and  this was dissected away and returned to the abdominal cavity.  The edges of  the hernia defect were then easily defined and the defect was about 2.0 cm.  The surrounding abdominal wall muscle and fascial layers were quite strong  and of normal thickness.  The repair was performed primarily  without mesh.  I used four interrupted sutures of #1 Novofil performing the repair in a  vest over pants fashion.  All four sutures were placed and then after all  four sutures were placed, we very carefully lifted this up and closed the  defect and tied all of the sutures down. This provided a very secure  overlapping repair. The wound was irrigated with saline. Hemostasis was  excellent. The umbilicus was tacked back to the abdominal wall fascia with 3-  0 Vicryl suture. The subcutaneous was closed with  interrupted sutures of 3-0 Vicryl. The skin was closed with a running  subcuticular suture of 4-0 Monocryl and Dermabond. The patient tolerated the  procedure well and was taken to the recovery area in stable condition.  Estimated blood loss was about 5 mL.  Complications none. Sponge, needle and  instrument counts were correct.     Hayw   HMI/MEDQ  D:  11/01/2003  T:  11/01/2003  Job:  [id number]   cc:   Raelyn Ensign. Vladimir Faster, M.D.  [address] [place]  [address]  Fax: [phone]   Candace Gallus, M.D.  [place]. [place] [address]  Fax: [phone]

## 2010-10-11 LAB — COMPREHENSIVE METABOLIC PANEL
ALT: 13 U/L (ref 0–53)
AST: 18 U/L (ref 0–37)
Alkaline Phosphatase: 99 U/L (ref 39–117)
CO2: 31 mEq/L (ref 19–32)
Chloride: 99 mEq/L (ref 96–112)
Creatinine, Ser: 0.83 mg/dL (ref 0.4–1.5)
GFR calc Af Amer: >60 mL/min (ref >60)
GFR calc non Af Amer: >60 mL/min (ref >60)
Potassium: 4.1 mEq/L (ref 3.5–5.1)
Total Bilirubin: 0.5 mg/dL (ref 0.3–1.2)

## 2010-10-11 LAB — CBC
MCV: 70.1 fL — ABNORMAL LOW (ref 78.0–100.0)
RBC: 4.83 MIL/uL (ref 4.22–5.81)
WBC: 11.2 10*3/uL — ABNORMAL HIGH (ref 4.0–10.5)

## 2010-10-11 LAB — URINALYSIS, ROUTINE W REFLEX MICROSCOPIC
Bilirubin Urine: NEGATIVE
Ketones, ur: NEGATIVE mg/dL
Nitrite: NEGATIVE
Protein, ur: NEGATIVE mg/dL
Urobilinogen, UA: 0.2 mg/dL (ref 0.0–1.0)

## 2010-10-11 LAB — DIFFERENTIAL
Basophils Absolute: 0.1 10*3/uL (ref 0.0–0.1)
Basophils Relative: 1 % (ref 0–1)
Eosinophils Absolute: 0.2 10*3/uL (ref 0.0–0.7)
Eosinophils Relative: 2 % (ref 0–5)
Lymphocytes Relative: 10 % — ABNORMAL LOW (ref 12–46)

## 2012-11-15 ENCOUNTER — Other Ambulatory Visit: Payer: Self-pay | Admitting: Physician Assistant

## 2012-11-15 ENCOUNTER — Ambulatory Visit
Admission: RE | Admit: 2012-11-15 | Discharge: 2012-11-15 | Disposition: A | Discharge status: Home / Self Care | Payer: Commercial Managed Care - PPO | Source: Ambulatory Visit | Attending: Physician Assistant | Admitting: Physician Assistant

## 2012-11-15 DIAGNOSIS — R9389 Abnormal findings on diagnostic imaging of other specified body structures: Secondary | ICD-10-CM

## 2012-11-15 MED ORDER — IOHEXOL 300 MG/ML  SOLN
75.0000 mL | Freq: Once | INTRAMUSCULAR | Status: AC | PRN
  Administered 2012-11-15: 75 mL via INTRAVENOUS

## 2012-11-22 ENCOUNTER — Telehealth: Payer: Self-pay | Admitting: *Deleted

## 2012-11-22 NOTE — Telephone Encounter (Signed)
Called pt due to receiving referral today.  Pt has had a CT chest but no other work up.  I will contact thoracic surgery to see if pt can be seen this Thursday.  I stated I would call pt back today. He verbalized understanding

## 2012-11-23 ENCOUNTER — Encounter: Payer: Self-pay | Admitting: *Deleted

## 2012-11-23 ENCOUNTER — Telehealth: Payer: Self-pay | Admitting: *Deleted

## 2012-11-23 NOTE — Telephone Encounter (Signed)
Called and spoke with pt.  I may pt aware that I am working on appt for pt.  I have called referring office to get PET scan ordered for pt per surgery request

## 2012-11-23 NOTE — Progress Notes (Signed)
Called [facility] and spoke with Sharee Pimple to order PET scan asap for pt.  She verbalized understanding

## 2012-11-24 ENCOUNTER — Telehealth: Payer: Self-pay | Admitting: *Deleted

## 2012-11-24 ENCOUNTER — Encounter: Payer: Self-pay | Admitting: *Deleted

## 2012-11-24 ENCOUNTER — Other Ambulatory Visit (HOSPITAL_COMMUNITY): Payer: Self-pay | Admitting: Family Medicine

## 2012-11-24 DIAGNOSIS — R918 Other nonspecific abnormal finding of lung field: Secondary | ICD-10-CM

## 2012-11-24 NOTE — Progress Notes (Signed)
Looked in EMR to see when PET scan was schedule.  I did not see anything ordered. I called and left message for Sharee Pimple at [facility] to call me with phone number.

## 2012-11-24 NOTE — Telephone Encounter (Signed)
Called [facility] regarding PET scan.  They had some cancellations and was able to get PET scan tomorrow am.  I instructed pt nothing to eat or drink after midnight.  He verbalized understanding of time and place of scan.

## 2012-11-25 ENCOUNTER — Encounter: Payer: Self-pay | Admitting: *Deleted

## 2012-11-25 ENCOUNTER — Ambulatory Visit (HOSPITAL_COMMUNITY)
Admission: RE | Admit: 2012-11-25 | Discharge: 2012-11-25 | Disposition: A | Discharge status: Home / Self Care | Payer: Commercial Managed Care - PPO | Source: Ambulatory Visit | Attending: Family Medicine | Admitting: Family Medicine

## 2012-11-25 DIAGNOSIS — R222 Localized swelling, mass and lump, trunk: Secondary | ICD-10-CM | POA: Insufficient documentation

## 2012-11-25 DIAGNOSIS — I2510 Atherosclerotic heart disease of native coronary artery without angina pectoris: Secondary | ICD-10-CM | POA: Insufficient documentation

## 2012-11-25 DIAGNOSIS — N289 Disorder of kidney and ureter, unspecified: Secondary | ICD-10-CM | POA: Insufficient documentation

## 2012-11-25 DIAGNOSIS — R918 Other nonspecific abnormal finding of lung field: Secondary | ICD-10-CM

## 2012-11-25 DIAGNOSIS — N200 Calculus of kidney: Secondary | ICD-10-CM | POA: Insufficient documentation

## 2012-11-25 DIAGNOSIS — I7090 Unspecified atherosclerosis: Secondary | ICD-10-CM | POA: Insufficient documentation

## 2012-11-25 MED ORDER — FLUDEOXYGLUCOSE F - 18 (FDG) INJECTION
20.1000 | Freq: Once | INTRAVENOUS | Status: AC | PRN
  Administered 2012-11-25: 20.1 via INTRAVENOUS

## 2012-11-25 NOTE — Progress Notes (Signed)
Called Mr. Gaertner gave him an appt with Dr. Joya Gaskins 11/26/12 at 10:00 arrive at 9:45.  I have him the address as well.  He verbalized understanding of time and place of appt

## 2012-11-26 ENCOUNTER — Ambulatory Visit (INDEPENDENT_AMBULATORY_CARE_PROVIDER_SITE_OTHER): Payer: Commercial Managed Care - PPO | Admitting: Critical Care Medicine

## 2012-11-26 ENCOUNTER — Other Ambulatory Visit (INDEPENDENT_AMBULATORY_CARE_PROVIDER_SITE_OTHER): Payer: Commercial Managed Care - PPO

## 2012-11-26 ENCOUNTER — Encounter: Payer: Self-pay | Admitting: Critical Care Medicine

## 2012-11-26 ENCOUNTER — Telehealth: Payer: Self-pay | Admitting: *Deleted

## 2012-11-26 VITALS — BP 118/74 | HR 78 | Temp 97.5°F | Ht 69.0 in | Wt 159.0 lb

## 2012-11-26 DIAGNOSIS — R918 Other nonspecific abnormal finding of lung field: Secondary | ICD-10-CM

## 2012-11-26 DIAGNOSIS — C3492 Malignant neoplasm of unspecified part of left bronchus or lung: Secondary | ICD-10-CM | POA: Insufficient documentation

## 2012-11-26 DIAGNOSIS — R042 Hemoptysis: Secondary | ICD-10-CM | POA: Insufficient documentation

## 2012-11-26 DIAGNOSIS — R222 Localized swelling, mass and lump, trunk: Secondary | ICD-10-CM

## 2012-11-26 DIAGNOSIS — N2889 Other specified disorders of kidney and ureter: Secondary | ICD-10-CM

## 2012-11-26 DIAGNOSIS — N289 Disorder of kidney and ureter, unspecified: Secondary | ICD-10-CM

## 2012-11-26 DIAGNOSIS — F17200 Nicotine dependence, unspecified, uncomplicated: Secondary | ICD-10-CM

## 2012-11-26 LAB — BASIC METABOLIC PANEL
BUN: 9 mg/dL (ref 6–23)
Creatinine, Ser: 0.8 mg/dL (ref 0.4–1.5)
GFR: 106.55 mL/min (ref >60.00)
Glucose, Bld: 79 mg/dL (ref 70–99)

## 2012-11-26 MED ORDER — NICOTINE 10 MG IN INHA: RESPIRATORY_TRACT | Status: DC

## 2012-11-26 MED ORDER — HYDROCODONE-HOMATROPINE 5-1.5 MG/5ML PO SYRP: 5.0000 mL | ORAL_SOLUTION | ORAL | Status: DC | PRN

## 2012-11-26 NOTE — Telephone Encounter (Signed)
Called pt with appt for [place] 12/09/12 at 1:00 arrive at 12:45.  Pt verbalized understanding of time and place of appt.

## 2012-11-26 NOTE — Patient Instructions (Addendum)
An abdominal MRI will be obtained A bronchoscopy will be obtained per Dr Joya Gaskins Stop smoking with Nicotrol inhaler and nicorette minis 40m 3-4 times per day No other medication changes  Return one week

## 2012-11-26 NOTE — Progress Notes (Signed)
Subjective:    Patient ID: [name], male    DOB: [birth year], 62 y.o.   MRN: [id number]  HPI  62 y.o.[facility] Nov 10 started BRB hemoptysis.  Chronic cough for one month and congestion and never went away.  Notes more dyspnea and wheezing.  No chest pain. No f/c/s.  Smokes 1ppd. Pt has back pain. Saw Ortho, gets cortisone injections in the neck  CT of chest and PET show LUL mass without mets   Past Medical History  Diagnosis Date  . Testicular cancer 2000    s/p resection, no recurrence  . Crohn's disease      Family History  Problem Relation Age of Onset  . Asthma Brother   . Testicular cancer Brother   . Cancer Brother   . Breast cancer Sister      History   Social History  . Marital Status: Married    Spouse Name: N/A    Number of Children: N/A  . Years of Education: N/A   Occupational History  . Consultant    Social History Main Topics  . Smoking status: Current Every Day Smoker -- 1.00 packs/day    Types: Cigarettes    Start date: 01/06/1966  . Smokeless tobacco: Never Used  . Alcohol Use: Yes     Comment: 2-3 beers per day  . Drug Use: No  . Sexual Activity: Not on file   Other Topics Concern  . Not on file   Social History Narrative  . No narrative on file     No Known Allergies   No outpatient prescriptions prior to visit.   No facility-administered medications prior to visit.     Review of Systems  Constitutional: Positive for activity change. Negative for fever, chills, diaphoresis, appetite change, fatigue and unexpected weight change.  HENT: Positive for congestion, rhinorrhea and sneezing. Negative for dental problem, ear discharge, ear pain, facial swelling, hearing loss, mouth sores, nosebleeds, postnasal drip, sinus pressure, sore throat, tinnitus, trouble swallowing and voice change.   Eyes: Negative for photophobia, discharge, itching and visual disturbance.  Respiratory: Positive for cough, shortness of breath and wheezing.  Negative for apnea, choking, chest tightness and stridor.   Cardiovascular: Negative for chest pain, palpitations and leg swelling.  Gastrointestinal: Negative for nausea, vomiting, abdominal pain, constipation, blood in stool and abdominal distention.  Genitourinary: Positive for flank pain. Negative for dysuria, urgency, frequency, hematuria, decreased urine volume and difficulty urinating.  Musculoskeletal: Positive for arthralgias, back pain, neck pain and neck stiffness. Negative for gait problem, joint swelling and myalgias.  Skin: Negative for color change, pallor and rash.  Neurological: Negative for dizziness, tremors, seizures, syncope, speech difficulty, weakness, light-headedness, numbness and headaches.  Hematological: Negative for adenopathy. Does not bruise/bleed easily.  Psychiatric/Behavioral: Positive for sleep disturbance. Negative for confusion and agitation. The patient is not nervous/anxious.        Objective:   Physical Exam Filed Vitals:   11/26/12 1000  BP: 118/74  Pulse: 78  Temp: 97.5 F (36.4 C)  TempSrc: Oral  Height: 5' 9"  (1.753 m)  Weight: 159 lb (72.122 kg)  SpO2: 99%    Gen: Pleasant, well-nourished, in no distress,  normal affect  ENT: No lesions,  mouth clear,  oropharynx clear, no postnasal drip  Neck: No JVD, no TMG, no carotid bruits  Lungs: No use of accessory muscles, no dullness to percussion, clear without rales or rhonchi  Cardiovascular: RRR, heart sounds normal, no murmur or gallops, no peripheral edema  Abdomen: soft and NT, no HSM,  BS normal  Musculoskeletal: No deformities, no cyanosis or clubbing  Neuro: alert, non focal  Skin: Warm, no lesions or rashes  No results found.   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 Astaxanthin 4 MG CAPS Take 1 tablet by mouth daily. With 325 mg of ala  . Cholecalciferol (VITAMIN D-3 PO) Take 8,000 Units by mouth daily.  .Marland KitchenKRILL OIL PO Take 2 capsules by mouth daily.  . Menaquinone-7 (VITAMIN K2 PO) Take 150 mcg by mouth daily.  . Probiotic Product (PROBIOTIC DAILY PO) Take 1 capsule by mouth daily.  .Marland KitchenRESVERATROL PO Take by mouth daily.  . TURMERIC CURCUMIN PO Take by mouth daily.  . vitamin E 200 UNIT capsule Take by mouth daily.  .Marland KitchenHYDROcodone-homatropine (HYCODAN) 5-1.5 MG/5ML syrup Take 5 mLs by mouth every 4 (four) hours as needed for cough.  . nicotine (NICOTROL) 10 MG inhaler Use 60- 80 puffs per cartridge, use 6 cartridges per day

## 2012-11-27 DIAGNOSIS — F17200 Nicotine dependence, unspecified, uncomplicated: Secondary | ICD-10-CM | POA: Insufficient documentation

## 2012-11-27 DIAGNOSIS — N2889 Other specified disorders of kidney and ureter: Secondary | ICD-10-CM | POA: Insufficient documentation

## 2012-11-27 NOTE — Assessment & Plan Note (Signed)
Tobacco use Nicotrol

## 2012-11-27 NOTE — Assessment & Plan Note (Addendum)
LUL lung mass with postobstructive pneumonitis , CA until proven other wise  pfts adequate for lobectomy Plan A bronchoscopy will be obtained per Dr Joya Gaskins Stop smoking with Nicotrol inhaler and nicorette minis 20m 3-4 times per day No other medication changes  [facility] one [facility]

## 2012-11-27 NOTE — Assessment & Plan Note (Signed)
L renal mass Plan abd MRI  needs urology eval

## 2012-11-27 NOTE — Assessment & Plan Note (Signed)
Hemoptysis d/t lung mass Plan FOB

## 2012-11-30 ENCOUNTER — Encounter (HOSPITAL_COMMUNITY)
Admission: RE | Disposition: A | Discharge status: Home / Self Care | Payer: Commercial Managed Care - PPO | Source: Ambulatory Visit | Attending: Critical Care Medicine

## 2012-11-30 ENCOUNTER — Ambulatory Visit (HOSPITAL_COMMUNITY)
Admission: RE | Admit: 2012-11-30 | Discharge: 2012-11-30 | Disposition: A | Discharge status: Home / Self Care | Payer: Commercial Managed Care - PPO | Source: Ambulatory Visit | Attending: Critical Care Medicine | Admitting: Critical Care Medicine

## 2012-11-30 ENCOUNTER — Ambulatory Visit (HOSPITAL_COMMUNITY): Payer: Commercial Managed Care - PPO

## 2012-11-30 ENCOUNTER — Encounter (HOSPITAL_COMMUNITY): Payer: Self-pay | Admitting: Critical Care Medicine

## 2012-11-30 DIAGNOSIS — C3410 Malignant neoplasm of upper lobe, unspecified bronchus or lung: Secondary | ICD-10-CM | POA: Insufficient documentation

## 2012-11-30 DIAGNOSIS — R222 Localized swelling, mass and lump, trunk: Secondary | ICD-10-CM

## 2012-11-30 DIAGNOSIS — M549 Dorsalgia, unspecified: Secondary | ICD-10-CM | POA: Insufficient documentation

## 2012-11-30 DIAGNOSIS — Z8547 Personal history of malignant neoplasm of testis: Secondary | ICD-10-CM | POA: Insufficient documentation

## 2012-11-30 DIAGNOSIS — Z79899 Other long term (current) drug therapy: Secondary | ICD-10-CM | POA: Insufficient documentation

## 2012-11-30 DIAGNOSIS — K5090 Crohn's disease, unspecified, without complications: Secondary | ICD-10-CM | POA: Insufficient documentation

## 2012-11-30 DIAGNOSIS — R042 Hemoptysis: Secondary | ICD-10-CM

## 2012-11-30 DIAGNOSIS — F17200 Nicotine dependence, unspecified, uncomplicated: Secondary | ICD-10-CM | POA: Insufficient documentation

## 2012-11-30 DIAGNOSIS — R918 Other nonspecific abnormal finding of lung field: Secondary | ICD-10-CM

## 2012-11-30 HISTORY — PX: VIDEO BRONCHOSCOPY: SHX5072

## 2012-11-30 SURGERY — BRONCHOSCOPY, WITH FLUOROSCOPY
Anesthesia: Moderate Sedation

## 2012-11-30 MED ORDER — PHENYLEPHRINE HCL 0.25 % NA SOLN
1.0000 | Freq: Four times a day (QID) | NASAL | Status: DC | PRN
  Filled 2012-11-30: qty 15

## 2012-11-30 MED ORDER — SODIUM CHLORIDE 0.9 % IV SOLN
INTRAVENOUS | Status: DC
  Administered 2012-11-30: 13:00:00 via INTRAVENOUS

## 2012-11-30 MED ORDER — LIDOCAINE HCL 1 % IJ SOLN
INTRAMUSCULAR | Status: DC | PRN
  Administered 2012-11-30: 6 mL via RESPIRATORY_TRACT

## 2012-11-30 MED ORDER — LIDOCAINE HCL 2 % EX GEL
Freq: Once | CUTANEOUS | Status: DC
  Filled 2012-11-30: qty 5

## 2012-11-30 MED ORDER — FENTANYL CITRATE 0.05 MG/ML IJ SOLN
INTRAMUSCULAR | Status: DC | PRN
  Administered 2012-11-30: 30 ug via INTRAVENOUS

## 2012-11-30 MED ORDER — FENTANYL CITRATE 0.05 MG/ML IJ SOLN
INTRAMUSCULAR | Status: AC
  Filled 2012-11-30: qty 4

## 2012-11-30 MED ORDER — MIDAZOLAM HCL 10 MG/2ML IJ SOLN
INTRAMUSCULAR | Status: DC | PRN
  Administered 2012-11-30: 3 mg via INTRAVENOUS

## 2012-11-30 MED ORDER — MIDAZOLAM HCL 10 MG/2ML IJ SOLN
INTRAMUSCULAR | Status: AC
  Filled 2012-11-30: qty 4

## 2012-11-30 MED ORDER — LIDOCAINE HCL 2 % EX GEL
CUTANEOUS | Status: DC | PRN
  Administered 2012-11-30: 1

## 2012-11-30 MED ORDER — PHENYLEPHRINE HCL 0.25 % NA SOLN
NASAL | Status: DC | PRN
  Administered 2012-11-30: 2 via NASAL

## 2012-11-30 MED ORDER — BUTAMBEN-TETRACAINE-BENZOCAINE 2-2-14 % EX AERO: 1.0000 | INHALATION_SPRAY | Freq: Once | CUTANEOUS | Status: DC

## 2012-11-30 NOTE — Progress Notes (Signed)
Video bronchoscopy performed Intervention bronchial washing Intervention bronchial biopsy Pt tolerated procedure well  Kathie Dike RRT   I was present and performed this procedure. Saralyn Pilar WrightMD

## 2012-11-30 NOTE — Interval H&P Note (Signed)
No interval changes since last H and P. The pt is ready for FOB.  Saralyn Pilar WrightMD

## 2012-11-30 NOTE — H&P (View-Only) (Signed)
Subjective:    Patient ID: [name], male    DOB: November 05, 1950, 62 y.o.   MRN: [id number]  HPI  62 y.o.[facility] Nov 10 started BRB hemoptysis.  Chronic cough for one month and congestion and never went away.  Notes more dyspnea and wheezing.  No chest pain. No f/c/s.  Smokes 1ppd. Pt has back pain. Saw Ortho, gets cortisone injections in the neck  CT of chest and PET show LUL mass without mets   Past Medical History  Diagnosis Date  . Testicular cancer 2000    s/p resection, no recurrence  . Crohn's disease      Family History  Problem Relation Age of Onset  . Asthma Brother   . Testicular cancer Brother   . Cancer Brother   . Breast cancer Sister      History   Social History  . Marital Status: Married    Spouse Name: N/A    Number of Children: N/A  . Years of Education: N/A   Occupational History  . Consultant    Social History Main Topics  . Smoking status: Current Every Day Smoker -- 1.00 packs/day    Types: Cigarettes    Start date: 01/06/1966  . Smokeless tobacco: Never Used  . Alcohol Use: Yes     Comment: 2-3 beers per day  . Drug Use: No  . Sexual Activity: Not on file   Other Topics Concern  . Not on file   Social History Narrative  . No narrative on file     No Known Allergies   No outpatient prescriptions prior to visit.   No facility-administered medications prior to visit.     Review of Systems  Constitutional: Positive for activity change. Negative for fever, chills, diaphoresis, appetite change, fatigue and unexpected weight change.  HENT: Positive for congestion, rhinorrhea and sneezing. Negative for dental problem, ear discharge, ear pain, facial swelling, hearing loss, mouth sores, nosebleeds, postnasal drip, sinus pressure, sore throat, tinnitus, trouble swallowing and voice change.   Eyes: Negative for photophobia, discharge, itching and visual disturbance.  Respiratory: Positive for cough, shortness of breath and wheezing.  Negative for apnea, choking, chest tightness and stridor.   Cardiovascular: Negative for chest pain, palpitations and leg swelling.  Gastrointestinal: Negative for nausea, vomiting, abdominal pain, constipation, blood in stool and abdominal distention.  Genitourinary: Positive for flank pain. Negative for dysuria, urgency, frequency, hematuria, decreased urine volume and difficulty urinating.  Musculoskeletal: Positive for arthralgias, back pain, neck pain and neck stiffness. Negative for gait problem, joint swelling and myalgias.  Skin: Negative for color change, pallor and rash.  Neurological: Negative for dizziness, tremors, seizures, syncope, speech difficulty, weakness, light-headedness, numbness and headaches.  Hematological: Negative for adenopathy. Does not bruise/bleed easily.  Psychiatric/Behavioral: Positive for sleep disturbance. Negative for confusion and agitation. The patient is not nervous/anxious.        Objective:   Physical Exam Filed Vitals:   11/26/12 1000  BP: 118/74  Pulse: 78  Temp: 97.5 F (36.4 C)  TempSrc: Oral  Height: 5' 9"  (1.753 m)  Weight: 159 lb (72.122 kg)  SpO2: 99%    Gen: Pleasant, well-nourished, in no distress,  normal affect  ENT: No lesions,  mouth clear,  oropharynx clear, no postnasal drip  Neck: No JVD, no TMG, no carotid bruits  Lungs: No use of accessory muscles, no dullness to percussion, clear without rales or rhonchi  Cardiovascular: RRR, heart sounds normal, no murmur or gallops, no peripheral edema  Abdomen: soft and NT, no HSM,  BS normal  Musculoskeletal: No deformities, no cyanosis or clubbing  Neuro: alert, non focal  Skin: Warm, no lesions or rashes  No results found.   All imaging studies reviewed Moderate restriction on pfts     Assessment & Plan:   Lung mass LUL lung mass with postobstructive pneumonitis , CA until proven other wise  pfts adequate for lobectomy Plan A bronchoscopy will be obtained per  Dr Joya Gaskins Stop smoking with Nicotrol inhaler and nicorette minis 43m 3-4 times per day No other medication changes  [facility] one [facility]     Hemoptysis Hemoptysis d/t lung mass Plan FOB  Tobacco use disorder Tobacco use Nicotrol   Renal mass, left L renal mass Plan abd MRI  needs urology eval   Updated Medication List Outpatient Encounter Prescriptions as of 11/26/2012  Medication Sig  . Astaxanthin 4 MG CAPS Take 1 tablet by mouth daily. With 325 mg of ala  . Cholecalciferol (VITAMIN D-3 PO) Take 8,000 Units by mouth daily.  .Marland KitchenKRILL OIL PO Take 2 capsules by mouth daily.  . Menaquinone-7 (VITAMIN K2 PO) Take 150 mcg by mouth daily.  . Probiotic Product (PROBIOTIC DAILY PO) Take 1 capsule by mouth daily.  .Marland KitchenRESVERATROL PO Take by mouth daily.  . TURMERIC CURCUMIN PO Take by mouth daily.  . vitamin E 200 UNIT capsule Take by mouth daily.  .Marland KitchenHYDROcodone-homatropine (HYCODAN) 5-1.5 MG/5ML syrup Take 5 mLs by mouth every 4 (four) hours as needed for cough.  . nicotine (NICOTROL) 10 MG inhaler Use 60- 80 puffs per cartridge, use 6 cartridges per day

## 2012-11-30 NOTE — Op Note (Signed)
Bronchoscopy Procedure Note  Date of Operation: 11/30/2012  Pre-op Diagnosis: LUL lung mass, hemoptysis  Post-op Diagnosis: Same, prob Lung CA  Surgeon: Asencion Noble  Anesthesia: Monitored Local Anesthesia with Sedation Versed 29m IVP; Fentanyl 388m IVP  Operation: Flexible fiberoptic bronchoscopy, diagnostic   Findings:  Exophytic friable lesion occluding LUL apical and posterior segments  Specimen:  TBBx/Bronchial Bx x8; Bronchial washings both LUL  Estimated Blood Loss: Minimal  Complications:  None  Indications and History: The patient is a 89.o. male with LUL lung mass.  The risks, benefits, complications, treatment options and expected outcomes were discussed with the patient.  The possibilities of reaction to medication, pulmonary aspiration, perforation of a viscus, bleeding, failure to diagnose a condition and creating a complication requiring transfusion or operation were discussed with the patient who freely signed the consent.    Description of Procedure: The patient was re-examined in the bronchoscopy suite and the site of surgery properly noted/marked.  The patient was identified as Reginald Matesnd the procedure verified as Flexible Fiberoptic Bronchoscopy.  A Time Out was held and the above information confirmed.   After the induction of topical nasopharyngeal anesthesia, the patient was positioned  and the bronchoscope was passed through the L  nares. The vocal cords were visualized and  1% buffered lidocaine 5 ml was topically placed onto the cords. The cords were normal. The scope was then passed into the trachea.  1% buffered lidocaine 5 ml was used topically on the carina.  Careful inspection of the tracheal lumen was accomplished. The scope was sequentially passed into the left main and then left upper and lower bronchi and segmental bronchi.     TBBx and Bronchial Bx x 8 taken LUL and bronchial washings were done and there were two  specimen.   The  scope was then withdrawn and advanced into the right main bronchus and then into the RUL, RML, and RLL bronchi and segmental bronchi. No specimens obtained   Endobronchial findings: Exophytic lesion occluding LUL anterior and posterior segments LUL Trachea: Normal mucosa Carina: Normal mucosa Right main bronchus: Normal mucosa Right upper lobe bronchus: Normal mucosa Right middle lobe bronchus: Normal mucosa Right lower lobe bronchus: Normal mucosa Left main bronchus: Normal mucosa Left upper lobe bronchus: Blunted with exophytic lesion LUL occluding orifice Left lower lobe bronchus: Normal mucosa  The Patient was taken to the Endoscopy Recovery area in satisfactory condition.  Attestation: I performed the procedure.  Reginald Sleeteeper  [phone]  [phone]  If no response or cell goes to voicemail, call beeper [phone]

## 2012-12-01 ENCOUNTER — Encounter (HOSPITAL_COMMUNITY): Payer: Self-pay | Admitting: Critical Care Medicine

## 2012-12-01 ENCOUNTER — Ambulatory Visit (HOSPITAL_COMMUNITY)
Admission: RE | Admit: 2012-12-01 | Discharge: 2012-12-01 | Disposition: A | Discharge status: Home / Self Care | Payer: Commercial Managed Care - PPO | Source: Ambulatory Visit | Attending: Critical Care Medicine | Admitting: Critical Care Medicine

## 2012-12-01 ENCOUNTER — Encounter (HOSPITAL_COMMUNITY): Payer: Commercial Managed Care - PPO

## 2012-12-01 DIAGNOSIS — C649 Malignant neoplasm of unspecified kidney, except renal pelvis: Secondary | ICD-10-CM | POA: Insufficient documentation

## 2012-12-01 DIAGNOSIS — D739 Disease of spleen, unspecified: Secondary | ICD-10-CM | POA: Insufficient documentation

## 2012-12-01 DIAGNOSIS — N2889 Other specified disorders of kidney and ureter: Secondary | ICD-10-CM

## 2012-12-01 DIAGNOSIS — D1803 Hemangioma of intra-abdominal structures: Secondary | ICD-10-CM | POA: Insufficient documentation

## 2012-12-01 MED ORDER — GADOBENATE DIMEGLUMINE 529 MG/ML IV SOLN
20.0000 mL | Freq: Once | INTRAVENOUS | Status: AC
  Administered 2012-12-01: 20 mL via INTRAVENOUS

## 2012-12-03 ENCOUNTER — Ambulatory Visit (INDEPENDENT_AMBULATORY_CARE_PROVIDER_SITE_OTHER): Payer: Commercial Managed Care - PPO | Admitting: Critical Care Medicine

## 2012-12-03 ENCOUNTER — Encounter: Payer: Self-pay | Admitting: Critical Care Medicine

## 2012-12-03 VITALS — BP 102/70 | HR 73 | Temp 98.0°F | Ht 69.0 in | Wt 159.6 lb

## 2012-12-03 DIAGNOSIS — C642 Malignant neoplasm of left kidney, except renal pelvis: Secondary | ICD-10-CM

## 2012-12-03 DIAGNOSIS — N2889 Other specified disorders of kidney and ureter: Secondary | ICD-10-CM

## 2012-12-03 DIAGNOSIS — J4489 Other specified chronic obstructive pulmonary disease: Secondary | ICD-10-CM

## 2012-12-03 DIAGNOSIS — C3490 Malignant neoplasm of unspecified part of unspecified bronchus or lung: Secondary | ICD-10-CM

## 2012-12-03 DIAGNOSIS — F17200 Nicotine dependence, unspecified, uncomplicated: Secondary | ICD-10-CM

## 2012-12-03 DIAGNOSIS — C3492 Malignant neoplasm of unspecified part of left bronchus or lung: Secondary | ICD-10-CM

## 2012-12-03 DIAGNOSIS — J449 Chronic obstructive pulmonary disease, unspecified: Secondary | ICD-10-CM

## 2012-12-03 DIAGNOSIS — C649 Malignant neoplasm of unspecified kidney, except renal pelvis: Secondary | ICD-10-CM

## 2012-12-03 DIAGNOSIS — N289 Disorder of kidney and ureter, unspecified: Secondary | ICD-10-CM

## 2012-12-03 LAB — CULTURE, RESPIRATORY W GRAM STAIN: Special Requests: NORMAL

## 2012-12-03 MED ORDER — BUDESONIDE-FORMOTEROL FUMARATE 160-4.5 MCG/ACT IN AERO: 2.0000 | INHALATION_SPRAY | Freq: Two times a day (BID) | RESPIRATORY_TRACT | Status: DC

## 2012-12-03 NOTE — Assessment & Plan Note (Signed)
The patient is currently using Nicotrol inhaler for smoking cessation Plan Continue to pursue smoking cessation

## 2012-12-03 NOTE — Assessment & Plan Note (Signed)
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

## 2012-12-03 NOTE — Assessment & Plan Note (Signed)
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

## 2012-12-03 NOTE — Patient Instructions (Signed)
Symbicort two puff twice daily start An appointment with either Dr Burman Nieves or Dutch Gray of [facility] will be made  Keep Cogswell appt next week Return two months I will follow your course through surgeries

## 2012-12-03 NOTE — Assessment & Plan Note (Signed)
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 This is confirmed on MRI of the abdomen.  Plan Pursue urology consultation for definitive resection. Note this patient will by left upper lobectomy first before renal resection is pursued

## 2012-12-03 NOTE — Progress Notes (Signed)
Subjective:    Patient ID: [name], male    DOB: [birth year], 62 y.o.   MRN: [id number]  HPI   62 y.o.[facility] Nov 10 started BRB hemoptysis.  Chronic cough for one month and congestion and never went away.  Notes more dyspnea and wheezing.  No chest pain. No f/c/s.  Smokes 1ppd. Pt has back pain. Saw Ortho, gets cortisone injections in the neck  CT of chest and PET show LUL mass without mets  12/03/2012 Chief Complaint  Patient presents with  . Follow-up    after bronch. Pt denies any complaints at this time.  Fob pos lung ca squamous cell.  MRI pos for renal cell Has MTOC OV pending PFTs mod restriction/obstruction.  Pt not smoking     Past Medical History  Diagnosis Date  . Testicular cancer 2000    s/p resection, no recurrence  . Crohn's disease      Family History  Problem Relation Age of Onset  . Asthma Brother   . Testicular cancer Brother   . Cancer Brother   . Breast cancer Sister      History   Social History  . Marital Status: Married    Spouse Name: N/A    Number of Children: N/A  . Years of Education: N/A   Occupational History  . Consultant    Social History Main Topics  . Smoking status: Current Every Day Smoker -- 1.00 packs/day    Types: Cigarettes    Start date: 01/06/1966  . Smokeless tobacco: Never Used     Comment: currently smoking 2 cigs a day  . Alcohol Use: Yes     Comment: 2-3 beers per day  . Drug Use: No  . Sexual Activity: Not on file   Other Topics Concern  . Not on file   Social History Narrative  . No narrative on file     No Known Allergies   Outpatient Prescriptions Prior to Visit  Medication Sig Dispense Refill  . Astaxanthin 4 MG CAPS Take 1 tablet by mouth daily. With 325 mg of ala      . Cholecalciferol (VITAMIN D-3 PO) Take 8,000 Units by mouth daily.      Marland Kitchen HYDROcodone-homatropine (HYCODAN) 5-1.5 MG/5ML syrup Take 5 mLs by mouth every 4 (four) hours as needed for cough.  240 mL  0  . KRILL OIL PO Take  2 capsules by mouth daily.      . Menaquinone-7 (VITAMIN K2 PO) Take 150 mcg by mouth daily.      . Probiotic Product (PROBIOTIC DAILY PO) Take 1 capsule by mouth daily.      Marland Kitchen RESVERATROL PO Take by mouth daily.      . TURMERIC CURCUMIN PO Take by mouth daily.      . vitamin E 200 UNIT capsule Take by mouth daily.      . nicotine (NICOTROL) 10 MG inhaler Use 60- 80 puffs per cartridge, use 6 cartridges per day  168 each  2   No facility-administered medications prior to visit.     Review of Systems  Constitutional: Positive for activity change. Negative for fever, chills, diaphoresis, appetite change, fatigue and unexpected weight change.  HENT: Positive for congestion, rhinorrhea and sneezing. Negative for dental problem, ear discharge, ear pain, facial swelling, hearing loss, mouth sores, nosebleeds, postnasal drip, sinus pressure, sore throat, tinnitus, trouble swallowing and voice change.   Eyes: Negative for photophobia, discharge, itching and visual disturbance.  Respiratory: Positive for cough,  shortness of breath and wheezing. Negative for apnea, choking, chest tightness and stridor.   Cardiovascular: Negative for chest pain, palpitations and leg swelling.  Gastrointestinal: Negative for nausea, vomiting, abdominal pain, constipation, blood in stool and abdominal distention.  Genitourinary: Positive for flank pain. Negative for dysuria, urgency, frequency, hematuria, decreased urine volume and difficulty urinating.  Musculoskeletal: Positive for arthralgias, back pain, neck pain and neck stiffness. Negative for gait problem, joint swelling and myalgias.  Skin: Negative for color change, pallor and rash.  Neurological: Negative for dizziness, tremors, seizures, syncope, speech difficulty, weakness, light-headedness, numbness and headaches.  Hematological: Negative for adenopathy. Does not bruise/bleed easily.  Psychiatric/Behavioral: Positive for sleep disturbance. Negative for  confusion and agitation. The patient is not nervous/anxious.        Objective:   Physical Exam  Filed Vitals:   12/03/12 0859  BP: 102/70  Pulse: 73  Temp: 98 F (36.7 C)  TempSrc: Oral  Height: 5' 9"  (1.753 m)  Weight: 159 lb 9.6 oz (72.394 kg)  SpO2: 92%    Gen: Pleasant, well-nourished, in no distress,  normal affect  ENT: No lesions,  mouth clear,  oropharynx clear, no postnasal drip  Neck: No JVD, no TMG, no carotid bruits  Lungs: No use of accessory muscles, no dullness to percussion, distant breath  Cardiovascular: RRR, heart sounds normal, no murmur or gallops, no peripheral edema  Abdomen: soft and NT, no HSM,  BS normal  Musculoskeletal: No deformities, no cyanosis or clubbing  Neuro: alert, non focal  Skin: Warm, no lesions or rashes  Mr Abdomen W Wo Contrast  12/02/2012   CLINICAL DATA:  Left renal mass. Central hypermetabolic left upper lobe pulmonary mass consistent with bronchogenic carcinoma.  EXAM: MRI ABDOMEN WITHOUT AND WITH CONTRAST  TECHNIQUE: Multiplanar multisequence MR imaging of the abdomen was performed both before and after the administration of intravenous contrast.  CONTRAST:  28m MULTIHANCE GADOBENATE DIMEGLUMINE 529 MG/ML IV SOLN  COMPARISON:  Abdominal CT 07/02/2009, chest CT 11/15/2012 and PET-CT 11/25/2012.  FINDINGS: Again demonstrated is a large heterogeneous mass involving the lower interpolar region of the left kidney. This measures 5.8 x 4.5 x 4.5 cm and demonstrates heterogeneous T1 signal and heterogeneous enhancement. Appearance is most consistent with renal cell carcinoma. The left renal vein and IVC appear normal. There is no adrenal mass. The right kidney appears normal.  At least 2 T2 hyperintense lesions are demonstrated in the posterior segment of the right hepatic lobe. On series 7, these measure 13 mm on image 8 and 10 mm on image 14. Both lesions demonstrate peripheral discontinuous enhancement typical of hemangiomas. No  suspicious liver lesions are identified. The gallbladder, biliary system and pancreas appear normal.  Inferiorly in the spleen is an ill-defined 3.7 cm nodule which demonstrates slight T2 hyperintensity. This lesion demonstrates enhancement slightly greater than the adjacent spleen. There is no abnormal metabolic activity in this area on the recent PET-CT. 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  No ascites or upper abdominal adenopathy is identified. There are no worrisome osseous lesions. 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  IMPRESSION: 1. 5.8 cm renal cell carcinoma involving the lower interpolar region of the left kidney. No intravascular extension or metastatic disease demonstrated. 2. No evidence of metastatic lung cancer. 3. At least 2 small hepatic hemangiomas are noted. 4. 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   Electronically Signed   By: BCamie PatienceM.D.   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 This is confirmed on MRI of the abdomen.  Plan Pursue urology consultation for definitive resection. 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 Astaxanthin 4 MG CAPS Take 1 tablet by mouth daily. With 325 mg of ala  . Cholecalciferol (VITAMIN D-3 PO) Take 8,000 Units by mouth daily.  Marland Kitchen HYDROcodone-homatropine (HYCODAN) 5-1.5 MG/5ML syrup Take 5 mLs by mouth every 4 (four) hours as needed for cough.  Marland Kitchen KRILL OIL PO Take 2 capsules by mouth daily.  . Menaquinone-7 (VITAMIN K2 PO) Take 150 mcg by mouth daily.  . Probiotic Product (PROBIOTIC DAILY PO) Take 1 capsule by mouth daily.  Marland Kitchen RESVERATROL PO Take by mouth daily.  . TURMERIC CURCUMIN PO Take by mouth daily.  . vitamin E 200 UNIT capsule Take by mouth daily.  . budesonide-formoterol (SYMBICORT) 160-4.5 MCG/ACT inhaler Inhale 2 puffs into the lungs 2 (two) times daily.  . nicotine (NICOTROL) 10 MG inhaler Use 60- 80 puffs per cartridge, use 6 cartridges per day

## 2012-12-09 ENCOUNTER — Other Ambulatory Visit: Payer: Self-pay | Admitting: Medical Oncology

## 2012-12-09 ENCOUNTER — Telehealth: Payer: Self-pay | Admitting: Internal Medicine

## 2012-12-09 ENCOUNTER — Encounter: Payer: Self-pay | Admitting: Internal Medicine

## 2012-12-09 ENCOUNTER — Ambulatory Visit: Payer: Commercial Managed Care - PPO | Attending: Internal Medicine | Admitting: Physical Therapy

## 2012-12-09 ENCOUNTER — Encounter: Payer: Self-pay | Admitting: Radiation Oncology

## 2012-12-09 ENCOUNTER — Ambulatory Visit (HOSPITAL_BASED_OUTPATIENT_CLINIC_OR_DEPARTMENT_OTHER): Payer: Commercial Managed Care - PPO | Admitting: Internal Medicine

## 2012-12-09 ENCOUNTER — Ambulatory Visit
Admission: RE | Admit: 2012-12-09 | Discharge: 2012-12-09 | Disposition: A | Discharge status: Home / Self Care | Payer: Commercial Managed Care - PPO | Source: Ambulatory Visit | Attending: Radiation Oncology | Admitting: Radiation Oncology

## 2012-12-09 ENCOUNTER — Encounter: Payer: Self-pay | Admitting: *Deleted

## 2012-12-09 DIAGNOSIS — C3492 Malignant neoplasm of unspecified part of left bronchus or lung: Secondary | ICD-10-CM

## 2012-12-09 DIAGNOSIS — C3490 Malignant neoplasm of unspecified part of unspecified bronchus or lung: Secondary | ICD-10-CM | POA: Insufficient documentation

## 2012-12-09 DIAGNOSIS — Z8546 Personal history of malignant neoplasm of prostate: Secondary | ICD-10-CM

## 2012-12-09 DIAGNOSIS — F17200 Nicotine dependence, unspecified, uncomplicated: Secondary | ICD-10-CM

## 2012-12-09 DIAGNOSIS — M542 Cervicalgia: Secondary | ICD-10-CM | POA: Insufficient documentation

## 2012-12-09 DIAGNOSIS — C3410 Malignant neoplasm of upper lobe, unspecified bronchus or lung: Secondary | ICD-10-CM

## 2012-12-09 DIAGNOSIS — IMO0001 Reserved for inherently not codable concepts without codable children: Secondary | ICD-10-CM | POA: Insufficient documentation

## 2012-12-09 DIAGNOSIS — R293 Abnormal posture: Secondary | ICD-10-CM | POA: Insufficient documentation

## 2012-12-09 MED ORDER — PROCHLORPERAZINE MALEATE 10 MG PO TABS: 10.0000 mg | ORAL_TABLET | Freq: Four times a day (QID) | ORAL | Status: DC | PRN

## 2012-12-09 NOTE — Progress Notes (Signed)
[place] Telephone:([phone]) [phone]   [phone] Multidisciplinary thoracic oncology clinic ([place] [place]) CONSULT NOTE  REFERRING PHYSICIAN: Dr. Asencion Noble  REASON FOR CONSULTATION:  62 years old white male recently diagnosed with lung cancer  HPI [name] is a 62 y.o. 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 The patient also has a history of Crohn's disease as well as long history of smoking. 3 weeks ago the patient had some cough accompanied by bright red blood hemoptysis. He also had chest congestion and cough for one months prior to this episode. He was seen by his primary care physician and chest x-ray was performed on 11/15/2012. It showed a lesion in the left upper lobe of the lung. This was followed by CT scan of the chest with contrast on 11/15/2012 and it showed central left upper lobe lung mass measured 3.7 x 3.4 CM. Intimately associated with the left pulmonary artery. The tumor extends to the left mediastinum without gross invasion and no pleural effusion or pneumothorax. There is a 9 mm prevascular node and calcified mediastinal and right hilar nodes consistent with old granulomatous disease. There was also incompletely imaged left renal mass highly suspicious for synchronous renal cell carcinoma. The patient was referred to Dr. Joya Gaskins and a PET scan was performed on 11/25/2012. It showed spiculated left upper lobe mass measures approximately 3.9 x 4.6 cm and has an SUV max of 24.4. An 8 mm short axis AP window lymph node may be mildly positive as well, with an SUV max of 4.5. No additional areas of abnormal hypermetabolism in the chest. There was also a 4.3 x 5.5 CM by the hypoattenuating mass in the left kidney relatively hypermetabolic with no associated retroperitoneal adenopathy suspicious for renal cell carcinoma. On 11/30/2012 the patient  underwent bronchoscopy under the care of Dr. Joya Gaskins with biopsy of the left upper lobe lung mass. It showed exophytic friable lesion occluding the left upper lobe apical and posterior segments. The final pathology (Accession: [id number]) showed invasive squamous cell carcinoma. MRI of the abdomen was performed in 12/03/2012 to evaluate the left renal mass and it showed 5.8 CM renal cell carcinoma involving the lower contact basilar region of the left kidney was no intravascular extension or metastatic disease demonstrated.  Dr. Joya Gaskins kindly referred the patient to me today for further evaluation and recommendation regarding treatment of his condition. When seen today the patient is feeling fine with no specific complaints except for mild cough productive of yellowish sputum. He denied having any more hemoptysis. He has no chest pain, shortness of breath. He has no weight loss or night sweats. The patient denied having any significant nausea or vomiting and no change in bowel movement. He denied having any hematuria or dysuria. The patient denied having any headaches or visual changes. Family history significant for father with liver cirrhosis, Brother with testicular cancer, sister with breast cancer and mother died from complication of subdural hematoma. The patient is married and has 2 children. He was accompanied by his wife, [name]). He mentions that he go up in [place], [place] with a lot of chemical exposure from the [employer] at that city. He works as a Warden/ranger. He has a history of smoking one pack per day for around 48 years and unfortunately he continues to smoke. I strongly encouraged him to quit smoking and offered him smoke cessation  program. He is cutting down on the amount of cigarettes he smokes. He drinks 2-3 beers every night and no history of drug abuse. HPI  Past Medical History  Diagnosis Date  . Testicular cancer 2000    s/p resection,  no recurrence  . Crohn's disease     Past Surgical History  Procedure Laterality Date  . Testicular removal  2000  . Appendectomy    . Hernia repair    . Video bronchoscopy N/A 11/30/2012    Procedure: VIDEO BRONCHOSCOPY WITH FLUORO;  Surgeon: Elsie Stain, MD;  Location: [facility];  Service: Cardiopulmonary;  Laterality: N/A;    Family History  Problem Relation Age of Onset  . Asthma Brother   . Testicular cancer Brother   . Cancer Brother   . Breast cancer Sister     Social History History  Substance Use Topics  . Smoking status: Current Every Day Smoker -- 1.00 packs/day    Types: Cigarettes    Start date: 01/06/1966  . Smokeless tobacco: Never Used     Comment: currently smoking 2 cigs a day  . Alcohol Use: Yes     Comment: 2-3 beers per day    No Known Allergies  Current Outpatient Prescriptions  Medication Sig Dispense Refill  . Astaxanthin 4 MG CAPS Take 1 tablet by mouth daily. With 325 mg of ala      . budesonide-formoterol (SYMBICORT) 160-4.5 MCG/ACT inhaler Inhale 2 puffs into the lungs 2 (two) times daily.  1 Inhaler  12  . Cholecalciferol (VITAMIN D-3 PO) Take 8,000 Units by mouth daily.      Marland Kitchen KRILL OIL PO Take 2 capsules by mouth daily.      . Menaquinone-7 (VITAMIN K2 PO) Take 150 mcg by mouth daily.      . Probiotic Product (PROBIOTIC DAILY PO) Take 1 capsule by mouth daily.      Marland Kitchen RESVERATROL PO Take by mouth daily.      . TURMERIC CURCUMIN PO Take by mouth daily.      . vitamin E 200 UNIT capsule Take by mouth daily.      . nicotine (NICOTROL) 10 MG inhaler Use 60- 80 puffs per cartridge, use 6 cartridges per day  168 each  2   No current facility-administered medications for this visit.    Review of Systems  Constitutional: negative Eyes: negative Ears, nose, mouth, throat, and face: negative Respiratory: positive for cough and sputum Cardiovascular: negative Gastrointestinal: negative Genitourinary:negative Integument/breast:  negative Hematologic/lymphatic: negative Musculoskeletal:negative Neurological: negative Behavioral/Psych: negative Endocrine: negative Allergic/Immunologic: negative  Physical Exam  DJS:HFWYO, healthy, no distress, well nourished and well developed SKIN: skin color, texture, turgor are normal, no rashes or significant lesions HEAD: Normocephalic, No masses, lesions, tenderness or abnormalities EYES: normal, PERRLA EARS: External ears normal, Canals clear OROPHARYNX:no exudate, no erythema and lips, buccal mucosa, and tongue normal  NECK: supple, no adenopathy, no JVD LYMPH:  no palpable lymphadenopathy, no hepatosplenomegaly LUNGS: clear to auscultation , and palpation HEART: regular rate & rhythm, no murmurs and no gallops ABDOMEN:abdomen soft, non-tender, normal bowel sounds and no masses or organomegaly BACK: Back symmetric, no curvature., No CVA tenderness EXTREMITIES:no joint deformities, effusion, or inflammation, no edema, no skin discoloration  NEURO: alert & oriented x 3 with fluent speech, no focal motor/sensory deficits  PERFORMANCE STATUS: ECOG 1  LABORATORY DATA: Lab Results  Component Value Date   WBC 8.0 12/07/2009   HGB 12.8* 12/07/2009   HCT 40.6 12/07/2009   MCV  89.8 12/07/2009   PLT 422* 12/07/2009      Chemistry      Component Value Date/Time   NA 136 11/26/2012 1140   K 4.2 11/26/2012 1140   CL 102 11/26/2012 1140   CO2 29 11/26/2012 1140   BUN 9 11/26/2012 1140   CREATININE 0.8 11/26/2012 1140      Component Value Date/Time   CALCIUM 9.1 11/26/2012 1140   ALKPHOS 96 07/02/2009 1157   AST 14 07/02/2009 1157   ALT 11 07/02/2009 1157   BILITOT 0.6 07/02/2009 1157       RADIOGRAPHIC STUDIES: Ct Chest W Contrast  11/15/2012   CLINICAL DATA:  Abnormal chest radiograph demonstrating left hilar fullness.  EXAM: CT CHEST WITH CONTRAST  TECHNIQUE: Multidetector CT imaging of the chest was performed during intravenous contrast administration.  CONTRAST:   19m OMNIPAQUE IOHEXOL 300 MG/ML  SOLN  COMPARISON:  Plain film of earlier in the day. No prior chest CT available.  FINDINGS: Lungs/Pleura: Mass effect upon the lingular bronchus are on image 32/series 3. Left upper lobe bronchial narrowing and near complete obstruction on image 29/series 3.  Mild centrilobular emphysema.  Probable postobstructive pneumonitis within the left upper lobe with scattered interstitial/reticular nodular opacities. Central left upper lobe lung mass which measures 3.7 x 3.4 cm on image 30/series 3. Intimately associated with the left pulmonary artery. Tumor extends towards the left mediastinum on image 32/ series 2, without gross invasion. No pleural effusion or pneumothorax.  Heart/Mediastinum: Aortic and branch vessel atherosclerosis. Normal heart size with coronary artery atherosclerosis. No pericardial effusion. No central pulmonary embolism, on this non-dedicated study.  9 mm prevascular node on image 25/series 2. Calcified mediastinal and right hilar nodes, consistent with old granulomatous disease.  Upper Abdomen: Mild left adrenal thickening. Incompletely imaged left renal mass of 5.2 cm. Heterogeneously enhancing. Example image 76. Prior midline laparotomy or ventral hernia repair.  Bones/Musculoskeletal:  Mild osteopenia.  IMPRESSION: 1. Central left upper lobe lung mass, most consistent with primary bronchogenic carcinoma. Near complete obstruction of the left upper lobe bronchus with surrounding left upper lobe postobstructive pneumonitis. 2. Suspicion of nodal metastasis within the adjacent prevascular space. No contralateral metastasis identified. 3. Incompletely imaged left renal mass, highly suspicious for synchronous renal cell carcinoma. Pre and post contrast abdominal CT or MRI could confirm. This study was a "Call report" .   Electronically Signed   By: KAbigail MiyamotoM.D.   On: 11/15/2012 17:32   Mr Abdomen W Wo Contrast  12/02/2012   CLINICAL DATA:  Left renal mass.  Central hypermetabolic left upper lobe pulmonary mass consistent with bronchogenic carcinoma.  EXAM: MRI ABDOMEN WITHOUT AND WITH CONTRAST  TECHNIQUE: Multiplanar multisequence MR imaging of the abdomen was performed both before and after the administration of intravenous contrast.  CONTRAST:  277mMULTIHANCE GADOBENATE DIMEGLUMINE 529 MG/ML IV SOLN  COMPARISON:  Abdominal CT 07/02/2009, chest CT 11/15/2012 and PET-CT 11/25/2012.  FINDINGS: Again demonstrated is a large heterogeneous mass involving the lower interpolar region of the left kidney. This measures 5.8 x 4.5 x 4.5 cm and demonstrates heterogeneous T1 signal and heterogeneous enhancement. Appearance is most consistent with renal cell carcinoma. The left renal vein and IVC appear normal. There is no adrenal mass. The right kidney appears normal.  At least 2 T2 hyperintense lesions are demonstrated in the posterior segment of the right hepatic lobe. On series 7, these measure 13 mm on image 8 and 10 mm on image 14. Both lesions demonstrate peripheral  discontinuous enhancement typical of hemangiomas. No suspicious liver lesions are identified. The gallbladder, biliary system and pancreas appear normal.  Inferiorly in the spleen is an ill-defined 3.7 cm nodule which demonstrates slight T2 hyperintensity. This lesion demonstrates enhancement slightly greater than the adjacent spleen. There is no abnormal metabolic activity in this area on the recent PET-CT. 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  No ascites or upper abdominal adenopathy is identified. There are no worrisome osseous lesions. 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  IMPRESSION: 1. 5.8 cm renal cell carcinoma involving the lower interpolar region of the left kidney. No intravascular extension or metastatic disease demonstrated. 2. No evidence of metastatic lung cancer. 3. At least 2 small hepatic hemangiomas are noted. 4. 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    Electronically Signed   By: Camie Patience M.D.   On: 12/02/2012 10:59   Nm Pet Image Initial (pi) Skull Base To Thigh  11/25/2012   CLINICAL DATA:  Initial treatment strategy for lung mass.  EXAM: NUCLEAR MEDICINE PET SKULL BASE TO THIGH  FASTING BLOOD GLUCOSE:  Value: 70m/dl  TECHNIQUE: 20.1 mCi F-18 FDG was injected intravenously. CT data was obtained and used for attenuation correction and anatomic localization only. (This was not acquired as a diagnostic CT examination.) Additional exam technical data entered on technologist worksheet.  COMPARISON:  CT chest 11/15/2012 and CT abdomen pelvis 07/02/2009.  FINDINGS: NECK  No hypermetabolic lymph nodes in the neck. CT images show no acute findings. Visualized portions of the paranasal sinuses and mastoid air cells are clear.  CHEST  Spiculated left upper lobe mass measures approximately 3.9 x 4.6 cm and has an SUV max of 24.4. An 8 mm short axis AP window lymph node may be mildly positive as well, with an SUV max of 4.5. No additional areas of abnormal hypermetabolism in the chest.  CT images show marked narrowing of the left upper lobe bronchi with postobstructive changes in the left upper lobe. No pleural fluid. Coronary artery calcification. Heart size normal. No pericardial effusion.  ABDOMEN/PELVIS  No abnormal hypermetabolism in the liver, adrenal glands, pancreas or spleen. No hypermetabolic lymph nodes.  A 4.3 x 5.5 cm mildly hyper attenuating mass in the left kidney is relatively hypometabolic. No associated retroperitoneal adenopathy. CT images otherwise show a grossly unremarkable liver, gallbladder and adrenal glands. Tiny stone in the lower pole right kidney. Spleen, pancreas, stomach and bowel are otherwise grossly unremarkable. Postoperative changes in the ventral abdominal wall. Atherosclerotic calcification of the arterial vasculature without abdominal aortic aneurysm.  SKELETON  No focal hypermetabolic activity to suggest skeletal metastasis.   IMPRESSION: 1. Hypermetabolic left upper lobe mass with postobstructive changes in the left upper lobe and a mildly hypermetabolic AP window lymph node. Findings are most indicative of primary bronchogenic carcinoma. 2. Hypometabolic left renal mass is most consistent with renal cell carcinoma. 3. Tiny right renal stone.   Electronically Signed   By: MLorin PicketM.D.   On: 11/25/2012 15:21   Dg C-arm Bronchoscopy  11/30/2012   CLINICAL DATA: bronch procedure   C-ARM BRONCHOSCOPY  Fluoroscopy was utilized by the requesting physician.  No radiographic  interpretation.     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  PLAN: I have a lengthy discussion with  the patient and his wife today about his disease stage, prognosis and treatment options. His case was discussed earlier today at the weekly thoracic conference. I will complete the staging workup by ordering MRI of the brain to rule out brain metastases. I discussed with the patient and his treatment options including a course of concurrent chemoradiation with weekly carboplatin for AUC of 2 and paclitaxel 45 mg/M2 for a total of 6-7 weeks based on the final dose of radiation. I discussed with the patient adverse effect of the chemotherapy including but not limited to alopecia, myelosuppression, nausea and vomiting, peripheral neuropathy, liver or renal dysfunction. The patient will be seen later today by Dr. Tammi Klippel for discussion of the radiotherapy option. I expect the patient to start the first dose of his concurrent chemoradiation on 12/20/2012. I will arrange for the patient to have a chemotherapy education class before starting the first dose of his chemotherapy. I will call his pharmacy with prescription for Compazine 10  mg by mouth every 6 hours as needed for nausea. For the left renal mass, the patient has an appointment with Dr. Tresa Moore next week for evaluation of this lesion.  I will arrange for the patient to come back for followup visit in 3 weeks for reevaluation and management any adverse effect of his treatment. He was advised to call immediately if he has any concerning symptoms in the interval. I gave the patient and his wife the time to ask questions and I answered them completely to their satisfactions.  The patient voices understanding of current disease status and treatment options and is in agreement with the current care plan.  All questions were answered. The patient knows to call the clinic with any problems, questions or concerns. We can certainly see the patient much sooner if necessary.  Thank you so much for allowing me to participate in the care of [name]. I will continue to follow up the patient with you and assist in his care.  I spent 55 minutes counseling the patient face to face. The total time spent in the appointment was 70 minutes.  [name] K. 12/09/2012, 2:28 PM

## 2012-12-09 NOTE — Telephone Encounter (Signed)
Called pt and left message regarding lab and MD visit on 12/22

## 2012-12-09 NOTE — Telephone Encounter (Signed)
Emailed Sharyn Lull regarding chemo appts

## 2012-12-09 NOTE — Telephone Encounter (Signed)
gave pt appt for lab and chemo class, informed Diane , Dr. Worthy Flank nurse regarding MD visit on 12/15 all MD's spots are full, waiting for md appt

## 2012-12-09 NOTE — Patient Instructions (Signed)
You are recently diagnosed with a stage IIIa non-small cell lung cancer. Your also have a left renal mass suspicious for renal cell carcinoma. We discussed treatment options including concurrent chemoradiation.

## 2012-12-09 NOTE — Progress Notes (Signed)
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 male who presented on 11/10 with hemoptysis.  Chest CT that day showed a central left upper lobe lung mass, most consistent with primary bronchogenic carcinoma with near complete obstruction of the left upper lobe bronchus with surrounding left upper lobe postobstructive pneumonitis. There was also suspicion of nodal metastasis within the adjacent prevascular space. No contralateral metastasis identified. The CT also showed an incompletely imaged left renal mass, highly suspicious for synchronous renal cell carcinoma.  PET/CT on 11/20 showed that the spiculated left upper lobe mass measures approximately 3.9 x 4.6 cm and has an SUV max of 24.4. An 8 mm short axis AP window lymph node may be mildly positive as well, with an SUV max of 4.5.  The kidney lesion was hypometabolic. Flexible fiberoptic bronchoscopy on 11/25 revealed an exophytic lesion occluding LUL anterior and posterior segments.  Biopsies showed squamous cell carcinoma.  MR abdomen on 11/26 demonstrated is a large heterogeneous mass involving the lower interpolar region of the left kidney. This measures 5.8 x 4.5 x 4.5 cm and demonstrates heterogeneous T1 signal and heterogeneous enhancement. Appearance is most consistent with renal cell carcinoma.   PREVIOUS RADIATION THERAPY: Yes, radiotherapy for testicular cancer in 2000, records requested  PAST MEDICAL HISTORY:  has a past medical history of Testicular cancer (2000) and Crohn's disease.     PAST SURGICAL HISTORY: Past Surgical History  Procedure Laterality Date  . Testicular removal  2000  . Appendectomy    . Hernia repair    . Video bronchoscopy N/A 11/30/2012    Procedure: VIDEO BRONCHOSCOPY WITH FLUORO;  Surgeon: Elsie Stain, MD;  Location: [facility];  Service: Cardiopulmonary;  Laterality: N/A;    FAMILY HISTORY: family history includes Asthma in his brother; Breast cancer in his sister; Cancer in his brother; Testicular cancer in his brother.  SOCIAL HISTORY:  reports that he has been smoking Cigarettes.  He started smoking about 46 years ago. He has been smoking about 1.00 pack per day. He has never used smokeless tobacco. He reports that he drinks alcohol. He reports that he does not use illicit drugs.  ALLERGIES: Review of patient's allergies indicates no known allergies.  MEDICATIONS:  Current Outpatient Prescriptions  Medication Sig Dispense Refill  . Astaxanthin 4 MG CAPS Take 1 tablet by mouth daily. With 325 mg of ala      . budesonide-formoterol (SYMBICORT) 160-4.5 MCG/ACT inhaler Inhale 2 puffs into the lungs 2 (two) times daily.  1 Inhaler  12  . Cholecalciferol (VITAMIN D-3 PO) Take 8,000 Units by mouth daily.      Marland Kitchen HYDROcodone-homatropine (HYCODAN) 5-1.5 MG/5ML syrup Take 5 mLs by mouth every 4 (four) hours as needed for cough.  240 mL  0  . KRILL OIL PO Take 2 capsules by mouth daily.      . Menaquinone-7 (VITAMIN K2 PO) Take 150 mcg by mouth daily.      . nicotine (NICOTROL) 10 MG inhaler Use 60- 80 puffs per cartridge, use 6 cartridges per day  168  each  2  . Probiotic Product (PROBIOTIC DAILY PO) Take 1 capsule by mouth daily.      Marland Kitchen RESVERATROL PO Take by mouth daily.      . TURMERIC CURCUMIN PO Take by mouth daily.      . vitamin E 200 UNIT capsule Take by mouth daily.       No current facility-administered medications for this encounter.    REVIEW OF SYSTEMS:  A 15 point review of systems is documented in the electronic medical  record. This was obtained by the nursing staff. However, I reviewed this with the patient to discuss relevant findings and make appropriate changes.  Pertinent items are noted in HPI.   PHYSICAL EXAM: Per pulmonary,  Gen: Pleasant, well-nourished, in no distress, normal affect  ENT: No lesions, mouth clear, oropharynx clear, no postnasal drip  Neck: No JVD, no TMG, no carotid bruits  Lungs: No use of accessory muscles, no dullness to percussion, clear without rales or rhonchi  Cardiovascular: RRR, heart sounds normal, no murmur or gallops, no peripheral edema  Abdomen: soft and NT, no HSM, BS normal  Musculoskeletal: No deformities, no cyanosis or clubbing  Neuro: alert, non focal  Skin: Warm, no lesions or rashes  KPS = 90  100 - Normal; no complaints; no evidence of disease. 90   - Able to carry on normal activity; minor signs or symptoms of disease. 80   - Normal activity with effort; some signs or symptoms of disease. 67   - Cares for self; unable to carry on normal activity or to do active work. 60   - Requires occasional assistance, but is able to care for most of his personal needs. 50   - Requires considerable assistance and frequent medical care. 2   - Disabled; requires special care and assistance. 26   - Severely disabled; hospital admission is indicated although death not imminent. 39   - Very sick; hospital admission necessary; active supportive treatment necessary. 10   - Moribund; fatal processes progressing rapidly. 0     - Dead  Karnofsky DA, Abelmann [place], Craver LS and Burchenal [facility] [phone]) The use of the nitrogen mustards in the palliative treatment of carcinoma: with particular reference to bronchogenic carcinoma Cancer 1 [id number]  LABORATORY DATA:  Lab Results  Component Value Date   WBC 8.0 12/07/2009   HGB 12.8* 12/07/2009   HCT 40.6 12/07/2009   MCV 89.8 12/07/2009   PLT 422* 12/07/2009   Lab Results  Component Value Date   NA 136 11/26/2012   K 4.2 11/26/2012     CL 102 11/26/2012   CO2 29 11/26/2012   Lab Results  Component Value Date   ALT 11 07/02/2009   AST 14 07/02/2009   ALKPHOS 96 07/02/2009   BILITOT 0.6 07/02/2009    PULMONARY FUNCTION TEST:      RADIOGRAPHY: Ct Chest W Contrast  11/15/2012   CLINICAL DATA:  Abnormal chest radiograph demonstrating left hilar fullness.  EXAM: CT CHEST WITH CONTRAST  TECHNIQUE: Multidetector CT imaging of the chest was performed during intravenous contrast administration.  CONTRAST:  105m OMNIPAQUE IOHEXOL 300 MG/ML  SOLN  COMPARISON:  Plain film of earlier in the day. No prior chest CT available.  FINDINGS: Lungs/Pleura: Mass effect upon the lingular bronchus are on image 32/series 3. Left upper lobe bronchial narrowing and near complete obstruction on image 29/series 3.  Mild centrilobular emphysema.  Probable postobstructive pneumonitis within the left upper lobe with scattered interstitial/reticular nodular  opacities. Central left upper lobe lung mass which measures 3.7 x 3.4 cm on image 30/series 3. Intimately associated with the left pulmonary artery. Tumor extends towards the left mediastinum on image 32/ series 2, without gross invasion. No pleural effusion or pneumothorax.  Heart/Mediastinum: Aortic and branch vessel atherosclerosis. Normal heart size with coronary artery atherosclerosis. No pericardial effusion. No central pulmonary embolism, on this non-dedicated study.  9 mm prevascular node on image 25/series 2. Calcified mediastinal and right hilar nodes, consistent with old granulomatous disease.  Upper Abdomen: Mild left adrenal thickening. Incompletely imaged left renal mass of 5.2 cm. Heterogeneously enhancing. Example image 76. Prior midline laparotomy or ventral hernia repair.  Bones/Musculoskeletal:  Mild osteopenia.  IMPRESSION: 1. Central left upper lobe lung mass, most consistent with primary bronchogenic carcinoma. Near complete obstruction of the left upper lobe bronchus with surrounding left  upper lobe postobstructive pneumonitis. 2. Suspicion of nodal metastasis within the adjacent prevascular space. No contralateral metastasis identified. 3. Incompletely imaged left renal mass, highly suspicious for synchronous renal cell carcinoma. Pre and post contrast abdominal CT or MRI could confirm. This study was a "Call report" .   Electronically Signed   By: Abigail Miyamoto M.D.   On: 11/15/2012 17:32   Mr Abdomen W Wo Contrast  12/02/2012   CLINICAL DATA:  Left renal mass. Central hypermetabolic left upper lobe pulmonary mass consistent with bronchogenic carcinoma.  EXAM: MRI ABDOMEN WITHOUT AND WITH CONTRAST  TECHNIQUE: Multiplanar multisequence MR imaging of the abdomen was performed both before and after the administration of intravenous contrast.  CONTRAST:  51m MULTIHANCE GADOBENATE DIMEGLUMINE 529 MG/ML IV SOLN  COMPARISON:  Abdominal CT 07/02/2009, chest CT 11/15/2012 and PET-CT 11/25/2012.  FINDINGS: Again demonstrated is a large heterogeneous mass involving the lower interpolar region of the left kidney. This measures 5.8 x 4.5 x 4.5 cm and demonstrates heterogeneous T1 signal and heterogeneous enhancement. Appearance is most consistent with renal cell carcinoma. The left renal vein and IVC appear normal. There is no adrenal mass. The right kidney appears normal.  At least 2 T2 hyperintense lesions are demonstrated in the posterior segment of the right hepatic lobe. On series 7, these measure 13 mm on image 8 and 10 mm on image 14. Both lesions demonstrate peripheral discontinuous enhancement typical of hemangiomas. No suspicious liver lesions are identified. The gallbladder, biliary system and pancreas appear normal.  Inferiorly in the spleen is an ill-defined 3.7 cm nodule which demonstrates slight T2 hyperintensity. This lesion demonstrates enhancement slightly greater than the adjacent spleen. There is no abnormal metabolic activity in this area on the recent PET-CT. 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  No ascites or upper abdominal adenopathy is identified. There are no worrisome osseous lesions. 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  IMPRESSION: 1. 5.8 cm renal cell carcinoma involving the lower interpolar region of the left kidney. No intravascular extension or metastatic disease demonstrated. 2. No evidence of metastatic lung cancer. 3. At least 2 small hepatic hemangiomas are noted. 4. 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   Electronically Signed   By: BCamie PatienceM.D.   On: 12/02/2012 10:59   Nm Pet Image Initial (pi) Skull Base To Thigh  11/25/2012   CLINICAL DATA:  Initial treatment strategy for lung mass.  EXAM: NUCLEAR MEDICINE PET SKULL BASE TO THIGH  FASTING BLOOD GLUCOSE:  Value: 917mdl  TECHNIQUE: 20.1 mCi F-18 FDG was injected intravenously. CT data was obtained and used for attenuation correction and  anatomic localization only. (This was not acquired as a diagnostic CT examination.) Additional exam technical data entered on technologist worksheet.  COMPARISON:  CT chest 11/15/2012 and CT abdomen pelvis 07/02/2009.  FINDINGS: NECK  No hypermetabolic lymph nodes in the neck. CT images show no acute findings. Visualized portions of the paranasal sinuses and mastoid air cells are clear.  CHEST  Spiculated left upper lobe mass measures approximately 3.9 x 4.6 cm and has an SUV max of 24.4. An 8 mm short axis AP window lymph node may be mildly positive as well, with an SUV max of 4.5. No additional areas of abnormal hypermetabolism in the chest.  CT images show marked narrowing of the left upper lobe bronchi with postobstructive changes in the left upper lobe. No pleural fluid. Coronary artery calcification. Heart size normal. No pericardial effusion.  ABDOMEN/PELVIS  No abnormal hypermetabolism in the liver, adrenal glands, pancreas or spleen. No hypermetabolic lymph nodes.  A 4.3 x 5.5 cm mildly hyper attenuating mass in the left kidney is  relatively hypometabolic. No associated retroperitoneal adenopathy. CT images otherwise show a grossly unremarkable liver, gallbladder and adrenal glands. Tiny stone in the lower pole right kidney. Spleen, pancreas, stomach and bowel are otherwise grossly unremarkable. Postoperative changes in the ventral abdominal wall. Atherosclerotic calcification of the arterial vasculature without abdominal aortic aneurysm.  SKELETON  No focal hypermetabolic activity to suggest skeletal metastasis.  IMPRESSION: 1. Hypermetabolic left upper lobe mass with postobstructive changes in the left upper lobe and a mildly hypermetabolic AP window lymph node. Findings are most indicative of primary bronchogenic carcinoma. 2. Hypometabolic left renal mass is most consistent with renal cell carcinoma. 3. Tiny right renal stone.   Electronically Signed   By: Lorin Picket M.D.   On: 11/25/2012 15:21   Dg C-arm Bronchoscopy  11/30/2012   CLINICAL DATA: bronch procedure   C-ARM BRONCHOSCOPY  Fluoroscopy was utilized by the requesting physician.  No radiographic  interpretation.       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  He will need percutaneous kidney biopsy and chemoradiotherapy for his lung cancer.  Because of bronchial obstruction and hemoptysis, I would favor initiating thoracic radiotherapy as soon as possible.  His renal mass will certainly need attention in due course.  PLAN:Today, I talked to the patient and family about the findings and work-up thus far.  We discussed the natural history of disease and general treatment, highlighting the role or radiotherapy in the management.  We discussed the available radiation techniques, and focused on the details of logistics and delivery.  We reviewed the anticipated acute and late sequelae associated with radiation in this setting.  The patient was encouraged to ask questions  that I answered to the best of my ability.  I filled out a patient counseling form during our discussion including treatment diagrams.  We retained a copy for our records.  The patient would like to proceed with radiation and will be scheduled for CT simulation.  I spent 60 minutes minutes face to face with the patient and more than 50% of that time was spent in counseling and/or coordination of care.  ------------------------------------------------  Sheral Apley. Tammi Klippel, M.D.

## 2012-12-10 ENCOUNTER — Ambulatory Visit
Admission: RE | Admit: 2012-12-10 | Discharge: 2012-12-10 | Disposition: A | Discharge status: Home / Self Care | Payer: Commercial Managed Care - PPO | Source: Ambulatory Visit | Attending: Radiation Oncology | Admitting: Radiation Oncology

## 2012-12-10 ENCOUNTER — Telehealth: Payer: Self-pay | Admitting: *Deleted

## 2012-12-10 DIAGNOSIS — N289 Disorder of kidney and ureter, unspecified: Secondary | ICD-10-CM | POA: Insufficient documentation

## 2012-12-10 DIAGNOSIS — C3490 Malignant neoplasm of unspecified part of unspecified bronchus or lung: Secondary | ICD-10-CM | POA: Insufficient documentation

## 2012-12-10 DIAGNOSIS — Z510 Encounter for antineoplastic radiation therapy: Secondary | ICD-10-CM | POA: Insufficient documentation

## 2012-12-10 DIAGNOSIS — L819 Disorder of pigmentation, unspecified: Secondary | ICD-10-CM | POA: Insufficient documentation

## 2012-12-10 DIAGNOSIS — Z79899 Other long term (current) drug therapy: Secondary | ICD-10-CM | POA: Insufficient documentation

## 2012-12-10 DIAGNOSIS — Y842 Radiological procedure and radiotherapy as the cause of abnormal reaction of the patient, or of later complication, without mention of misadventure at the time of the procedure: Secondary | ICD-10-CM | POA: Insufficient documentation

## 2012-12-10 NOTE — Progress Notes (Signed)
  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  Identity was confirmed.  All relevant records and images related to the planned course of therapy were reviewed.  The patient freely provided informed written consent to proceed with treatment after reviewing the details related to the planned course of therapy. The consent form was witnessed and verified by the simulation staff.  Then, the patient was set-up in a stable reproducible  supine position for radiation therapy.  CT images were obtained.  Surface markings were placed.  The CT images were loaded into the planning software.  Then the target and avoidance structures were contoured.  Treatment planning then occurred.  The radiation prescription was entered and confirmed.  Then, I designed and supervised the construction of a total of 1 medically necessary complex treatment device for the purpose of customized immobilization.  I have requested : 3D Simulation  I have requested a DVH of the following structures: Target, Heart, Lungs, Spinal Cord.  I have ordered:CBC  SPECIAL TREATMENT PROCEDURE:  The planned course of therapy using radiation constitutes a special treatment procedure. Special care is required in the management of this patient for the following reasons.   This treatment constitutes a Special Treatment Procedure for the following reason: [ Concurrent chemotherapy requiring careful monitoring for increased toxicities of treatment including weekly laboratory values.. The special nature of the planned course of radiotherapy will require increased physician supervision and oversight to ensure patient's safety with  optimal treatment outcomes.  PLAN:  The patient will receive 66 Gy in 33 fractions.  ________________________________  Sheral Apley Tammi Klippel, M.D.

## 2012-12-10 NOTE — Telephone Encounter (Signed)
Per staff message and POF I have scheduled appts. I have advised scheduler to move lab appt  JMW

## 2012-12-10 NOTE — Telephone Encounter (Signed)
Called pt to follow up from [facility] yesterday.  I listened as pt describes he is ready for treatment.  I asked if he could identify any needs at this time.  He stated no.  He stated he has my phone number if he can identify any.

## 2012-12-13 ENCOUNTER — Ambulatory Visit
Admission: RE | Admit: 2012-12-13 | Discharge: 2012-12-13 | Disposition: A | Discharge status: Home / Self Care | Payer: Commercial Managed Care - PPO | Source: Ambulatory Visit | Attending: Internal Medicine | Admitting: Internal Medicine

## 2012-12-13 MED ORDER — GADOBENATE DIMEGLUMINE 529 MG/ML IV SOLN
14.0000 mL | Freq: Once | INTRAVENOUS | Status: AC | PRN
  Administered 2012-12-13: 14 mL via INTRAVENOUS

## 2012-12-14 ENCOUNTER — Other Ambulatory Visit: Payer: Commercial Managed Care - PPO

## 2012-12-14 ENCOUNTER — Encounter: Payer: Self-pay | Admitting: *Deleted

## 2012-12-14 ENCOUNTER — Telehealth: Payer: Self-pay | Admitting: Internal Medicine

## 2012-12-14 NOTE — Telephone Encounter (Signed)
Talked to pt and gave him appt for lab,md and chemo for December

## 2012-12-15 ENCOUNTER — Encounter: Payer: Self-pay | Admitting: *Deleted

## 2012-12-20 ENCOUNTER — Ambulatory Visit
Admission: RE | Admit: 2012-12-20 | Discharge: 2012-12-20 | Disposition: A | Discharge status: Home / Self Care | Payer: Commercial Managed Care - PPO | Source: Ambulatory Visit | Attending: Radiation Oncology | Admitting: Radiation Oncology

## 2012-12-20 ENCOUNTER — Ambulatory Visit (HOSPITAL_BASED_OUTPATIENT_CLINIC_OR_DEPARTMENT_OTHER): Payer: Commercial Managed Care - PPO

## 2012-12-20 ENCOUNTER — Other Ambulatory Visit (HOSPITAL_BASED_OUTPATIENT_CLINIC_OR_DEPARTMENT_OTHER): Payer: Commercial Managed Care - PPO

## 2012-12-20 DIAGNOSIS — C3410 Malignant neoplasm of upper lobe, unspecified bronchus or lung: Secondary | ICD-10-CM

## 2012-12-20 DIAGNOSIS — Z5111 Encounter for antineoplastic chemotherapy: Secondary | ICD-10-CM

## 2012-12-20 LAB — CBC WITH DIFFERENTIAL/PLATELET
Basophils Absolute: 0 10*3/uL (ref 0.0–0.1)
EOS%: 3.1 % (ref 0.0–7.0)
Eosinophils Absolute: 0.4 10*3/uL (ref 0.0–0.5)
HGB: 12 g/dL — ABNORMAL LOW (ref 13.0–17.1)
MCH: 28.4 pg (ref 27.2–33.4)
MCHC: 32.8 g/dL (ref 32.0–36.0)
MCV: 86.5 fL (ref 79.3–98.0)
NEUT#: 11.6 10*3/uL — ABNORMAL HIGH (ref 1.5–6.5)
RBC: 4.23 10*6/uL (ref 4.20–5.82)
RDW: 14.2 % (ref 11.0–14.6)
lymph#: 0.6 10*3/uL — ABNORMAL LOW (ref 0.9–3.3)

## 2012-12-20 LAB — COMPREHENSIVE METABOLIC PANEL (CC13)
ALT: <6 U/L (ref 0–55)
AST: 8 U/L (ref 5–34)
Albumin: 3 g/dL — ABNORMAL LOW (ref 3.5–5.0)
Anion Gap: 11 mEq/L (ref 3–11)
BUN: 12.7 mg/dL (ref 7.0–26.0)
Calcium: 9.6 mg/dL (ref 8.4–10.4)
Chloride: 99 mEq/L (ref 98–109)
Potassium: 4.5 mEq/L (ref 3.5–5.1)
Sodium: 136 mEq/L (ref 136–145)

## 2012-12-20 MED ORDER — DIPHENHYDRAMINE HCL 50 MG/ML IJ SOLN
50.0000 mg | Freq: Once | INTRAMUSCULAR | Status: AC
  Administered 2012-12-20: 50 mg via INTRAVENOUS

## 2012-12-20 MED ORDER — CARBOPLATIN CHEMO INJECTION 450 MG/45ML
240.8000 mg | Freq: Once | INTRAVENOUS | Status: AC
  Administered 2012-12-20: 240 mg via INTRAVENOUS
  Filled 2012-12-20: qty 24

## 2012-12-20 MED ORDER — DEXAMETHASONE SODIUM PHOSPHATE 20 MG/5ML IJ SOLN
20.0000 mg | Freq: Once | INTRAMUSCULAR | Status: AC
  Administered 2012-12-20: 20 mg via INTRAVENOUS

## 2012-12-20 MED ORDER — FAMOTIDINE IN NACL 20-0.9 MG/50ML-% IV SOLN
INTRAVENOUS | Status: AC
  Filled 2012-12-20: qty 50

## 2012-12-20 MED ORDER — OXYCODONE-ACETAMINOPHEN 5-325 MG PO TABS
1.0000 | ORAL_TABLET | Freq: Once | ORAL | Status: AC
  Administered 2012-12-20: 1 via ORAL

## 2012-12-20 MED ORDER — ONDANSETRON 16 MG/50ML IVPB (CHCC)
16.0000 mg | Freq: Once | INTRAVENOUS | Status: AC
  Administered 2012-12-20: 16 mg via INTRAVENOUS

## 2012-12-20 MED ORDER — ONDANSETRON 16 MG/50ML IVPB (CHCC)
INTRAVENOUS | Status: AC
  Filled 2012-12-20: qty 16

## 2012-12-20 MED ORDER — SODIUM CHLORIDE 0.9 % IV SOLN
45.0000 mg/m2 | Freq: Once | INTRAVENOUS | Status: AC
  Administered 2012-12-20: 84 mg via INTRAVENOUS
  Filled 2012-12-20: qty 14

## 2012-12-20 MED ORDER — OXYCODONE-ACETAMINOPHEN 5-325 MG PO TABS
ORAL_TABLET | ORAL | Status: AC
  Filled 2012-12-20: qty 1

## 2012-12-20 MED ORDER — FAMOTIDINE IN NACL 20-0.9 MG/50ML-% IV SOLN
20.0000 mg | Freq: Once | INTRAVENOUS | Status: AC
  Administered 2012-12-20: 20 mg via INTRAVENOUS

## 2012-12-20 MED ORDER — DEXAMETHASONE SODIUM PHOSPHATE 20 MG/5ML IJ SOLN
INTRAMUSCULAR | Status: AC
  Filled 2012-12-20: qty 5

## 2012-12-20 MED ORDER — DIPHENHYDRAMINE HCL 50 MG/ML IJ SOLN
INTRAMUSCULAR | Status: AC
  Filled 2012-12-20: qty 1

## 2012-12-20 MED ORDER — SODIUM CHLORIDE 0.9 % IV SOLN
Freq: Once | INTRAVENOUS | Status: AC
  Administered 2012-12-20: 10:00:00 via INTRAVENOUS

## 2012-12-20 NOTE — Patient Instructions (Addendum)
[facility] Discharge Instructions for Patients Receiving Chemotherapy  Today you received the following chemotherapy agents: Taxol and Carboplatin.  To help prevent nausea and vomiting after your treatment, we encourage you to take your nausea medication.   If you develop nausea and vomiting that is not controlled by your nausea medication, call the clinic.   BELOW ARE SYMPTOMS THAT SHOULD BE REPORTED IMMEDIATELY:  *FEVER GREATER THAN 100.5 F  *CHILLS WITH OR WITHOUT FEVER  NAUSEA AND VOMITING THAT IS NOT CONTROLLED WITH YOUR NAUSEA MEDICATION  *UNUSUAL SHORTNESS OF BREATH  *UNUSUAL BRUISING OR BLEEDING  TENDERNESS IN MOUTH AND THROAT WITH OR WITHOUT PRESENCE OF ULCERS  *URINARY PROBLEMS  *BOWEL PROBLEMS  UNUSUAL RASH Items with * indicate a potential emergency and should be followed up as soon as possible.  Feel free to call the clinic you have any questions or concerns. The clinic phone number is [phone].  Carboplatin injection What is this medicine? CARBOPLATIN (KAR boe pla tin) is a chemotherapy drug. It targets fast dividing cells, like cancer cells, and causes these cells to die. This medicine is used to treat ovarian cancer and many other cancers. This medicine may be used for other purposes; ask your health care provider or pharmacist if you have questions. COMMON BRAND NAME(S): Paraplatin What should I tell my health care provider before I take this medicine? They need to know if you have any of these conditions: -blood disorders -hearing problems -kidney disease -recent or ongoing radiation therapy -an unusual or allergic reaction to carboplatin, cisplatin, other chemotherapy, other medicines, foods, dyes, or preservatives -pregnant or trying to get pregnant -breast-feeding How should I use this medicine? This drug is usually given as an infusion into a vein. It is administered in a hospital or clinic by a specially trained health care  professional. Talk to your pediatrician regarding the use of this medicine in children. Special care may be needed. Overdosage: If you think you have taken too much of this medicine contact a poison control center or emergency room at once. NOTE: This medicine is only for you. Do not share this medicine with others. What if I miss a dose? It is important not to miss a dose. Call your doctor or health care professional if you are unable to keep an appointment. What may interact with this medicine? -medicines for seizures -medicines to increase blood counts like filgrastim, pegfilgrastim, sargramostim -some antibiotics like amikacin, gentamicin, neomycin, streptomycin, tobramycin -vaccines Talk to your doctor or health care professional before taking any of these medicines: -acetaminophen -aspirin -ibuprofen -ketoprofen -naproxen This list may not describe all possible interactions. Give your health care provider a list of all the medicines, herbs, non-prescription drugs, or dietary supplements you use. Also tell them if you smoke, drink alcohol, or use illegal drugs. Some items may interact with your medicine. What should I watch for while using this medicine? Your condition will be monitored carefully while you are receiving this medicine. You will need important blood work done while you are taking this medicine. This drug may make you feel generally unwell. This is not uncommon, as chemotherapy can affect healthy cells as well as cancer cells. Report any side effects. Continue your course of treatment even though you feel ill unless your doctor tells you to stop. In some cases, you may be given additional medicines to help with side effects. Follow all directions for their use. Call your doctor or health care professional for advice if you get a  fever, chills or sore throat, or other symptoms of a cold or flu. Do not treat yourself. This drug decreases your body's ability to fight infections.  Try to avoid being around people who are sick. This medicine may increase your risk to bruise or bleed. Call your doctor or health care professional if you notice any unusual bleeding. Be careful brushing and flossing your teeth or using a toothpick because you may get an infection or bleed more easily. If you have any dental work done, tell your dentist you are receiving this medicine. Avoid taking products that contain aspirin, acetaminophen, ibuprofen, naproxen, or ketoprofen unless instructed by your doctor. These medicines may hide a fever. Do not become pregnant while taking this medicine. Women should inform their doctor if they wish to become pregnant or think they might be pregnant. There is a potential for serious side effects to an unborn child. Talk to your health care professional or pharmacist for more information. Do not breast-feed an infant while taking this medicine. What side effects may I notice from receiving this medicine? Side effects that you should report to your doctor or health care professional as soon as possible: -allergic reactions like skin rash, itching or hives, swelling of the face, lips, or tongue -signs of infection - fever or chills, cough, sore throat, pain or difficulty passing urine -signs of decreased platelets or bleeding - bruising, pinpoint red spots on the skin, black, tarry stools, nosebleeds -signs of decreased red blood cells - unusually weak or tired, fainting spells, lightheadedness -breathing problems -changes in hearing -changes in vision -chest pain -high blood pressure -low blood counts - This drug may decrease the number of white blood cells, red blood cells and platelets. You may be at increased risk for infections and bleeding. -nausea and vomiting -pain, swelling, redness or irritation at the injection site -pain, tingling, numbness in the hands or feet -problems with balance, talking, walking -trouble passing urine or change in the  amount of urine Side effects that usually do not require medical attention (report to your doctor or health care professional if they continue or are bothersome): -hair loss -loss of appetite -metallic taste in the mouth or changes in taste This list may not describe all possible side effects. Call your doctor for medical advice about side effects. You may report side effects to FDA at 1-800-FDA-1088. Where should I keep my medicine? This drug is given in a hospital or clinic and will not be stored at home. NOTE: This sheet is a summary. It may not cover all possible information. If you have questions about this medicine, talk to your doctor, pharmacist, or health care provider.  2014, [employer]/Gold Standard. (2007-03-30 14:38:05)  Paclitaxel injection (Taxol) What is this medicine? PACLITAXEL (PAK li TAX el) is a chemotherapy drug. It targets fast dividing cells, like cancer cells, and causes these cells to die. This medicine is used to treat ovarian cancer, breast cancer, and other cancers. This medicine may be used for other purposes; ask your health care provider or pharmacist if you have questions. COMMON BRAND NAME(S): Onxol , Taxol What should I tell my health care provider before I take this medicine? They need to know if you have any of these conditions: -blood disorders -irregular heartbeat -infection (especially a virus infection such as chickenpox, cold sores, or herpes) -liver disease -previous or ongoing radiation therapy -an unusual or allergic reaction to paclitaxel, alcohol, polyoxyethylated castor oil, other chemotherapy agents, other medicines, foods, dyes, or preservatives -pregnant or  trying to get pregnant -breast-feeding How should I use this medicine? This drug is given as an infusion into a vein. It is administered in a hospital or clinic by a specially trained health care professional. Talk to your pediatrician regarding the use of this medicine in children.  Special care may be needed. Overdosage: If you think you have taken too much of this medicine contact a poison control center or emergency room at once. NOTE: This medicine is only for you. Do not share this medicine with others. What if I miss a dose? It is important not to miss your dose. Call your doctor or health care professional if you are unable to keep an appointment. What may interact with this medicine? Do not take this medicine with any of the following medications: -disulfiram -metronidazole This medicine may also interact with the following medications: -cyclosporine -diazepam -ketoconazole -medicines to increase blood counts like filgrastim, pegfilgrastim, sargramostim -other chemotherapy drugs like cisplatin, doxorubicin, epirubicin, etoposide, teniposide, vincristine -quinidine -testosterone -vaccines -verapamil Talk to your doctor or health care professional before taking any of these medicines: -acetaminophen -aspirin -ibuprofen -ketoprofen -naproxen This list may not describe all possible interactions. Give your health care provider a list of all the medicines, herbs, non-prescription drugs, or dietary supplements you use. Also tell them if you smoke, drink alcohol, or use illegal drugs. Some items may interact with your medicine. What should I watch for while using this medicine? Your condition will be monitored carefully while you are receiving this medicine. You will need important blood work done while you are taking this medicine. This drug may make you feel generally unwell. This is not uncommon, as chemotherapy can affect healthy cells as well as cancer cells. Report any side effects. Continue your course of treatment even though you feel ill unless your doctor tells you to stop. In some cases, you may be given additional medicines to help with side effects. Follow all directions for their use. Call your doctor or health care professional for advice if you get  a fever, chills or sore throat, or other symptoms of a cold or flu. Do not treat yourself. This drug decreases your body's ability to fight infections. Try to avoid being around people who are sick. This medicine may increase your risk to bruise or bleed. Call your doctor or health care professional if you notice any unusual bleeding. Be careful brushing and flossing your teeth or using a toothpick because you may get an infection or bleed more easily. If you have any dental work done, tell your dentist you are receiving this medicine. Avoid taking products that contain aspirin, acetaminophen, ibuprofen, naproxen, or ketoprofen unless instructed by your doctor. These medicines may hide a fever. Do not become pregnant while taking this medicine. Women should inform their doctor if they wish to become pregnant or think they might be pregnant. There is a potential for serious side effects to an unborn child. Talk to your health care professional or pharmacist for more information. Do not breast-feed an infant while taking this medicine. Men are advised not to father a child while receiving this medicine. What side effects may I notice from receiving this medicine? Side effects that you should report to your doctor or health care professional as soon as possible: -allergic reactions like skin rash, itching or hives, swelling of the face, lips, or tongue -low blood counts - This drug may decrease the number of white blood cells, red blood cells and platelets. You may be at  increased risk for infections and bleeding. -signs of infection - fever or chills, cough, sore throat, pain or difficulty passing urine -signs of decreased platelets or bleeding - bruising, pinpoint red spots on the skin, black, tarry stools, nosebleeds -signs of decreased red blood cells - unusually weak or tired, fainting spells, lightheadedness -breathing problems -chest pain -high or low blood pressure -mouth sores -nausea and  vomiting -pain, swelling, redness or irritation at the injection site -pain, tingling, numbness in the hands or feet -slow or irregular heartbeat -swelling of the ankle, feet, hands Side effects that usually do not require medical attention (report to your doctor or health care professional if they continue or are bothersome): -bone pain -complete hair loss including hair on your head, underarms, pubic hair, eyebrows, and eyelashes -changes in the color of fingernails -diarrhea -loosening of the fingernails -loss of appetite -muscle or joint pain -red flush to skin -sweating This list may not describe all possible side effects. Call your doctor for medical advice about side effects. You may report side effects to FDA at 1-800-FDA-1088. Where should I keep my medicine? This drug is given in a hospital or clinic and will not be stored at home. NOTE: This sheet is a summary. It may not cover all possible information. If you have questions about this medicine, talk to your doctor, pharmacist, or health care provider.  2014, [employer]/Gold Standard. (2012-02-16 16:41:21)

## 2012-12-20 NOTE — Progress Notes (Signed)
1045- taxol started, education completed, patient educated on signs of reaction. See MAR for rate changes and and vital sign FS for VS.

## 2012-12-21 ENCOUNTER — Ambulatory Visit
Admission: RE | Admit: 2012-12-21 | Discharge: 2012-12-21 | Disposition: A | Discharge status: Home / Self Care | Payer: Commercial Managed Care - PPO | Source: Ambulatory Visit | Attending: Radiation Oncology | Admitting: Radiation Oncology

## 2012-12-21 ENCOUNTER — Telehealth: Payer: Self-pay

## 2012-12-21 NOTE — Telephone Encounter (Signed)
lvm that we are following up after first chemo. To call if any questions or concerns.

## 2012-12-21 NOTE — Telephone Encounter (Signed)
Message copied by Janace Hoard on Tue Dec 21, 2012 10:17 AM ------      Message from: Adalberto Cole      Created: Mon Dec 20, 2012 11:20 AM      Regarding: new chemo      Contact: [phone]       1st time carbo/taxol. Patient of Dr. Julien Nordmann.  ------

## 2012-12-22 ENCOUNTER — Encounter: Payer: Self-pay | Admitting: Radiation Oncology

## 2012-12-22 ENCOUNTER — Ambulatory Visit
Admission: RE | Admit: 2012-12-22 | Discharge: 2012-12-22 | Disposition: A | Discharge status: Home / Self Care | Payer: Commercial Managed Care - PPO | Source: Ambulatory Visit | Attending: Radiation Oncology | Admitting: Radiation Oncology

## 2012-12-22 MED ORDER — RADIAPLEXRX EX GEL
Freq: Once | CUTANEOUS | Status: AC
  Administered 2012-12-22: 20:00:00 via TOPICAL

## 2012-12-22 NOTE — Progress Notes (Signed)
  Radiation Oncology         [phone] ________________________________  Name: [name] MRN: [id number]  Date: 12/22/2012  DOB: 1950-02-22  Weekly Radiation Therapy Management  Current Dose: 6 Gy     Planned Dose:  66 Gy  Narrative . . . . . . . . The patient presents for routine under treatment assessment.                                   The patient is without complaint.                                 Set-up films were reviewed.                                 The chart was checked. Physical Findings. . .  weight is 159 lb (72.122 kg). His respiration is 18. . Weight essentially stable.  No significant changes. Impression . . . . . . . The patient is tolerating radiation. Plan . . . . . . . . . . . . Continue treatment as planned.  ________________________________  Sheral Apley. Tammi Klippel, M.D.

## 2012-12-22 NOTE — Progress Notes (Addendum)
Post sim education completed with patient and his wife. Oriented both to staff and routine of the clinic. Provided patient with radiaplex gel and directed upon use. Provided patient with RADIATION THERAPY AND YOU handbook and reviewed pertinent information. Educated patient reference potential side effects and management such as, skin changes, fatigue, and throat changes. Allowed patient and wife time to ask questions. Encouraged to contact this Probation officer for future need and provided patient with business card. Reports cough sounds different and is more persistent. Reports his cough is only occasionally productive with clear sputum. Denies difficulty swallowing. Reports that his weight has remained stabled. Reports difficulty sleeping due to cough. Reports he feels cold often despite dressing warm. Reports feeling nauseated today, took compazine and nausea resolved. Reports that the nausea is related to being hungry he believes.

## 2012-12-23 ENCOUNTER — Ambulatory Visit
Admission: RE | Admit: 2012-12-23 | Discharge: 2012-12-23 | Disposition: A | Discharge status: Home / Self Care | Payer: Commercial Managed Care - PPO | Source: Ambulatory Visit | Attending: Radiation Oncology | Admitting: Radiation Oncology

## 2012-12-24 ENCOUNTER — Ambulatory Visit
Admission: RE | Admit: 2012-12-24 | Discharge: 2012-12-24 | Disposition: A | Discharge status: Home / Self Care | Payer: Commercial Managed Care - PPO | Source: Ambulatory Visit | Attending: Radiation Oncology | Admitting: Radiation Oncology

## 2012-12-27 ENCOUNTER — Ambulatory Visit (HOSPITAL_BASED_OUTPATIENT_CLINIC_OR_DEPARTMENT_OTHER): Payer: Commercial Managed Care - PPO

## 2012-12-27 ENCOUNTER — Other Ambulatory Visit: Payer: Self-pay | Admitting: Internal Medicine

## 2012-12-27 ENCOUNTER — Ambulatory Visit (HOSPITAL_BASED_OUTPATIENT_CLINIC_OR_DEPARTMENT_OTHER): Payer: Commercial Managed Care - PPO | Admitting: Internal Medicine

## 2012-12-27 ENCOUNTER — Encounter: Payer: Self-pay | Admitting: Internal Medicine

## 2012-12-27 ENCOUNTER — Other Ambulatory Visit (HOSPITAL_BASED_OUTPATIENT_CLINIC_OR_DEPARTMENT_OTHER): Payer: Commercial Managed Care - PPO

## 2012-12-27 ENCOUNTER — Ambulatory Visit
Admission: RE | Admit: 2012-12-27 | Discharge: 2012-12-27 | Disposition: A | Discharge status: Home / Self Care | Payer: Commercial Managed Care - PPO | Source: Ambulatory Visit | Attending: Radiation Oncology | Admitting: Radiation Oncology

## 2012-12-27 DIAGNOSIS — C3410 Malignant neoplasm of upper lobe, unspecified bronchus or lung: Secondary | ICD-10-CM

## 2012-12-27 DIAGNOSIS — R930 Abnormal findings on diagnostic imaging of skull and head, not elsewhere classified: Secondary | ICD-10-CM

## 2012-12-27 DIAGNOSIS — Z5111 Encounter for antineoplastic chemotherapy: Secondary | ICD-10-CM

## 2012-12-27 LAB — FUNGUS CULTURE W SMEAR: Fungal Smear: NONE SEEN

## 2012-12-27 LAB — COMPREHENSIVE METABOLIC PANEL (CC13)
ALT: 8 U/L (ref 0–55)
AST: 7 U/L (ref 5–34)
Alkaline Phosphatase: 83 U/L (ref 40–150)
Anion Gap: 8 mEq/L (ref 3–11)
CO2: 26 mEq/L (ref 22–29)
Creatinine: 0.8 mg/dL (ref 0.7–1.3)
Glucose: 104 mg/dl (ref 70–140)
Potassium: 4.4 mEq/L (ref 3.5–5.1)
Total Bilirubin: 0.38 mg/dL (ref 0.20–1.20)
Total Protein: 6.8 g/dL (ref 6.4–8.3)

## 2012-12-27 LAB — CBC WITH DIFFERENTIAL/PLATELET
BASO%: 0.3 % (ref 0.0–2.0)
Basophils Absolute: 0 10*3/uL (ref 0.0–0.1)
EOS%: 6.1 % (ref 0.0–7.0)
Eosinophils Absolute: 0.6 10*3/uL — ABNORMAL HIGH (ref 0.0–0.5)
HGB: 12.9 g/dL — ABNORMAL LOW (ref 13.0–17.1)
LYMPH%: 6.3 % — ABNORMAL LOW (ref 14.0–49.0)
MCH: 28.5 pg (ref 27.2–33.4)
MCHC: 33.2 g/dL (ref 32.0–36.0)
MONO#: 0.6 10*3/uL (ref 0.1–0.9)
NEUT#: 7.6 10*3/uL — ABNORMAL HIGH (ref 1.5–6.5)
Platelets: 376 10*3/uL (ref 140–400)
RDW: 14.1 % (ref 11.0–14.6)
WBC: 9.4 10*3/uL (ref 4.0–10.3)
lymph#: 0.6 10*3/uL — ABNORMAL LOW (ref 0.9–3.3)

## 2012-12-27 MED ORDER — DIPHENHYDRAMINE HCL 50 MG/ML IJ SOLN
INTRAMUSCULAR | Status: AC
  Filled 2012-12-27: qty 1

## 2012-12-27 MED ORDER — LORAZEPAM 2 MG/ML IJ SOLN
INTRAMUSCULAR | Status: AC
  Filled 2012-12-27: qty 1

## 2012-12-27 MED ORDER — DEXAMETHASONE SODIUM PHOSPHATE 20 MG/5ML IJ SOLN
INTRAMUSCULAR | Status: AC
  Filled 2012-12-27: qty 5

## 2012-12-27 MED ORDER — SODIUM CHLORIDE 0.9 % IV SOLN
45.0000 mg/m2 | Freq: Once | INTRAVENOUS | Status: AC
  Administered 2012-12-27: 84 mg via INTRAVENOUS
  Filled 2012-12-27: qty 14

## 2012-12-27 MED ORDER — FAMOTIDINE IN NACL 20-0.9 MG/50ML-% IV SOLN
INTRAVENOUS | Status: AC
  Filled 2012-12-27: qty 50

## 2012-12-27 MED ORDER — SODIUM CHLORIDE 0.9 % IV SOLN
Freq: Once | INTRAVENOUS | Status: AC
  Administered 2012-12-27: 10:00:00 via INTRAVENOUS

## 2012-12-27 MED ORDER — LORAZEPAM 2 MG/ML IJ SOLN
0.5000 mg | Freq: Once | INTRAMUSCULAR | Status: AC
  Administered 2012-12-27: 0.5 mg via INTRAVENOUS

## 2012-12-27 MED ORDER — ONDANSETRON 16 MG/50ML IVPB (CHCC)
INTRAVENOUS | Status: AC
  Filled 2012-12-27: qty 16

## 2012-12-27 MED ORDER — DEXAMETHASONE SODIUM PHOSPHATE 20 MG/5ML IJ SOLN
20.0000 mg | Freq: Once | INTRAMUSCULAR | Status: AC
  Administered 2012-12-27: 20 mg via INTRAVENOUS

## 2012-12-27 MED ORDER — ONDANSETRON 16 MG/50ML IVPB (CHCC)
16.0000 mg | Freq: Once | INTRAVENOUS | Status: AC
  Administered 2012-12-27: 16 mg via INTRAVENOUS

## 2012-12-27 MED ORDER — DIPHENHYDRAMINE HCL 50 MG/ML IJ SOLN
50.0000 mg | Freq: Once | INTRAMUSCULAR | Status: AC
  Administered 2012-12-27: 50 mg via INTRAVENOUS

## 2012-12-27 MED ORDER — SODIUM CHLORIDE 0.9 % IV SOLN
240.8000 mg | Freq: Once | INTRAVENOUS | Status: AC
  Administered 2012-12-27: 240 mg via INTRAVENOUS
  Filled 2012-12-27: qty 24

## 2012-12-27 MED ORDER — FAMOTIDINE IN NACL 20-0.9 MG/50ML-% IV SOLN
20.0000 mg | Freq: Once | INTRAVENOUS | Status: AC
  Administered 2012-12-27: 20 mg via INTRAVENOUS

## 2012-12-27 MED ORDER — LORAZEPAM 2 MG/ML IJ SOLN
0.5000 mg | INTRAMUSCULAR | Status: AC
  Administered 2012-12-27: 0.5 mg via INTRAVENOUS

## 2012-12-27 MED ORDER — PACLITAXEL CHEMO INJECTION 300 MG/50ML
45.0000 mg/m2 | Freq: Once | INTRAVENOUS | Status: DC
  Filled 2012-12-27: qty 14

## 2012-12-27 NOTE — Progress Notes (Signed)
Reports "tingling in feet" .  Feet very restless and pt confirms the feeling. Per Dr Julien Nordmann Ativan given . Restlessness persisted with pt getting up and down from recliner periodically.Ativan repeated per Dr Julien Nordmann.. Wife with pt and I instructed her and pt about side effects of ativan. I called a report to radiation oncology. Pt left via wheelchair.

## 2012-12-27 NOTE — Patient Instructions (Signed)
CURRENT THERAPY: Concurrent chemoradiation with weekly carboplatin for AUC of 2 and paclitaxel 45 mg/M2, status post 1 cycle.  CHEMOTHERAPY INTENT: Curative  CURRENT # OF CHEMOTHERAPY CYCLES:2  CURRENT ANTIEMETICS: Zofran, Compazine and Decadron  CURRENT SMOKING STATUS: Former smoker  ORAL CHEMOTHERAPY AND CONSENT: None  CURRENT BISPHOSPHONATES USE: None  PAIN MANAGEMENT: 0/10  NARCOTICS INDUCED CONSTIPATION: None  LIVING WILL AND CODE STATUS: Full code

## 2012-12-27 NOTE — Progress Notes (Signed)
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  PRIOR THERAPY:None.  CURRENT THERAPY: Concurrent chemoradiation with weekly carboplatin for AUC of 2 and paclitaxel 45 mg/M2, status post 1 cycle.  CHEMOTHERAPY INTENT: Curative  CURRENT # OF CHEMOTHERAPY CYCLES:2  CURRENT ANTIEMETICS: Zofran, Compazine and Decadron  CURRENT SMOKING STATUS: Former smoker  ORAL CHEMOTHERAPY AND CONSENT: None  CURRENT BISPHOSPHONATES USE: None  PAIN MANAGEMENT: 0/10  NARCOTICS INDUCED CONSTIPATION: None  LIVING WILL AND CODE STATUS: Full code   INTERVAL HISTORY: [name] 62 y.o. male returns to the clinic today for followup visit accompanied by his wife. He tolerated the first dose of his concurrent chemoradiation fairly well with no significant complaints except for occasional nausea as well as dry cough. The patient denied having any significant chest pain, shortness of breath or hemoptysis. He denied having any fever or chills. He denied having any significant weight loss or night sweats. He had MRI of the brain that showed no significant evidence for metastatic disease to the brain but there is questionable 1.3 CM right frontal lobe lesion that could represent a skull metastasis or a benign entity such as hemangioma of bone.  MEDICAL HISTORY: Past Medical History  Diagnosis Date  . Testicular cancer 2000    s/p resection, no recurrence  . Crohn's disease     ALLERGIES:  has No Known Allergies.  MEDICATIONS:  Current Outpatient Prescriptions  Medication Sig Dispense Refill  . Astaxanthin 4 MG CAPS Take 1 tablet by mouth daily. With 325 mg of ala      . budesonide-formoterol  (SYMBICORT) 160-4.5 MCG/ACT inhaler Inhale 2 puffs into the lungs 2 (two) times daily.  1 Inhaler  12  . Cholecalciferol (VITAMIN D-3 PO) Take 8,000 Units by mouth daily.      Marland Kitchen KRILL OIL PO Take 2 capsules by mouth daily.      . Menaquinone-7 (VITAMIN K2 PO) Take 150 mcg by mouth daily.      . Probiotic Product (PROBIOTIC DAILY PO) Take 1 capsule by mouth daily.      . prochlorperazine (COMPAZINE) 10 MG tablet Take 1 tablet (10 mg total) by mouth every 6 (six) hours as needed for nausea or vomiting.  60 tablet  0  . RESVERATROL PO Take by mouth daily.      . TURMERIC CURCUMIN PO Take by mouth daily.      . vitamin E 200 UNIT capsule Take by mouth daily.       No current facility-administered medications for this visit.    SURGICAL HISTORY:  Past Surgical History  Procedure Laterality Date  . Testicular removal  2000  . Appendectomy    . Hernia repair    . Video bronchoscopy N/A 11/30/2012    Procedure: VIDEO BRONCHOSCOPY WITH FLUORO;  Surgeon: Elsie Stain, MD;  Location: [facility];  Service: Cardiopulmonary;  Laterality: N/A;    REVIEW OF SYSTEMS:  A comprehensive review of systems was negative except for: Respiratory: positive for cough Gastrointestinal: positive for nausea   PHYSICAL EXAMINATION: General appearance: alert, cooperative, fatigued and no distress Head: Normocephalic, without obvious abnormality, atraumatic Neck: no adenopathy, no JVD, supple, symmetrical, trachea midline and thyroid not enlarged, symmetric, no tenderness/mass/nodules  Lymph nodes: Cervical, supraclavicular, and axillary nodes normal. Resp: clear to auscultation bilaterally Back: symmetric, no curvature. ROM normal. No CVA tenderness. Cardio: regular rate and rhythm, S1, S2 normal, no murmur, click, rub or gallop GI: soft, non-tender; bowel sounds normal; no masses,  no organomegaly Extremities: extremities normal, atraumatic, no cyanosis or edema  ECOG PERFORMANCE STATUS: 1 - Symptomatic  but completely ambulatory  Blood pressure 108/62, pulse 91, temperature 97.5 F (36.4 C), temperature source Oral, resp. rate 20, height 5' 9"  (1.753 m), weight 157 lb 12.8 oz (71.578 kg).  LABORATORY DATA: Lab Results  Component Value Date   WBC 9.4 12/27/2012   HGB 12.9* 12/27/2012   HCT 38.8 12/27/2012   MCV 85.8 12/27/2012   PLT 376 12/27/2012      Chemistry      Component Value Date/Time   NA 136 12/20/2012 0922   NA 136 11/26/2012 1140   K 4.5 12/20/2012 0922   K 4.2 11/26/2012 1140   CL 102 11/26/2012 1140   CO2 25 12/20/2012 0922   CO2 29 11/26/2012 1140   BUN 12.7 12/20/2012 0922   BUN 9 11/26/2012 1140   CREATININE 0.9 12/20/2012 0922   CREATININE 0.8 11/26/2012 1140      Component Value Date/Time   CALCIUM 9.6 12/20/2012 0922   CALCIUM 9.1 11/26/2012 1140   ALKPHOS 84 12/20/2012 0922   ALKPHOS 96 07/02/2009 1157   AST 8 12/20/2012 0922   AST 14 07/02/2009 1157   ALT <6 12/20/2012 0922   ALT 11 07/02/2009 1157   BILITOT 0.48 12/20/2012 0922   BILITOT 0.6 07/02/2009 1157       RADIOGRAPHIC STUDIES: Mr Jeri Cos Wo Contrast  26-Dec-2012   CLINICAL DATA:  Metastatic carcinoma.  Staging.  EXAM: MRI HEAD WITHOUT AND WITH CONTRAST  TECHNIQUE: Multiplanar, multiecho pulse sequences of the brain and surrounding structures were obtained without and with intravenous contrast.  CONTRAST:  2m MULTIHANCE GADOBENATE DIMEGLUMINE 529 MG/ML IV SOLN  COMPARISON:  None.  FINDINGS: Diffusion imaging does not show any acute or subacute infarction. There are a few old small vessel insults within the cerebral hemispheric white matter. There is focal atrophy and gliosis in the right frontal lobe, possibly due to old closed head injury. No evidence of hydrocephalus or extra-axial collection. No pituitary mass. Sinuses, middle ears and mastoids are clear.  Postcontrast images do not show any abnormal enhancement of the brain or leptomeninges.  In the right frontal bone of the calvarium,  there is a 13 mm lesion which could represent a calvarial metastasis. The differential diagnosis does include benign entities such as hemangioma of the bone.  IMPRESSION: No evidence of metastatic disease to the brain or leptomeninges.  13 mm right frontal bone lesion that is indeterminate. This could represent a skull metastasis or could represent a benign entity such as hemangioma of bone.  Focal atrophy and gliosis in the right frontal lobe suggesting the possibility of previous head trauma.   Electronically Signed   By: MNelson ChimesM.D.   On: 112/21/1416:15   Mr Abdomen W Wo Contrast  12/02/2012   CLINICAL DATA:  Left renal mass. Central hypermetabolic left upper lobe pulmonary mass consistent with bronchogenic carcinoma.  EXAM: MRI ABDOMEN WITHOUT AND WITH CONTRAST  TECHNIQUE: Multiplanar multisequence MR imaging of the abdomen was performed both before and after the administration of intravenous contrast.  CONTRAST:  265mMULTIHANCE GADOBENATE DIMEGLUMINE 529 MG/ML IV SOLN  COMPARISON:  Abdominal CT 07/02/2009, chest CT  11/15/2012 and PET-CT 11/25/2012.  FINDINGS: Again demonstrated is a large heterogeneous mass involving the lower interpolar region of the left kidney. This measures 5.8 x 4.5 x 4.5 cm and demonstrates heterogeneous T1 signal and heterogeneous enhancement. Appearance is most consistent with renal cell carcinoma. The left renal vein and IVC appear normal. There is no adrenal mass. The right kidney appears normal.  At least 2 T2 hyperintense lesions are demonstrated in the posterior segment of the right hepatic lobe. On series 7, these measure 13 mm on image 8 and 10 mm on image 14. Both lesions demonstrate peripheral discontinuous enhancement typical of hemangiomas. No suspicious liver lesions are identified. The gallbladder, biliary system and pancreas appear normal.  Inferiorly in the spleen is an ill-defined 3.7 cm nodule which demonstrates slight T2 hyperintensity. This lesion  demonstrates enhancement slightly greater than the adjacent spleen. There is no abnormal metabolic activity in this area on the recent PET-CT. 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  No ascites or upper abdominal adenopathy is identified. There are no worrisome osseous lesions. 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  IMPRESSION: 1. 5.8 cm renal cell carcinoma involving the lower interpolar region of the left kidney. No intravascular extension or metastatic disease demonstrated. 2. No evidence of metastatic lung cancer. 3. At least 2 small hepatic hemangiomas are noted. 4. 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   Electronically Signed   By: Camie Patience M.D.   On: 12/02/2012 10:59   Dg C-arm Bronchoscopy  11/30/2012   CLINICAL DATA: bronch procedure   C-ARM BRONCHOSCOPY  Fluoroscopy was utilized by the requesting physician.  No radiographic  interpretation.     ASSESSMENT AND PLAN: This is a very pleasant 62 years old white male recently diagnosed with a stage IIIa non-small cell lung cancer currently undergoing concurrent chemoradiation with weekly carboplatin and paclitaxel status post 1 cycle. The patient is tolerating his treatment fairly well with no significant adverse effects. I recommended for him to continue his current treatment as scheduled. He would come back for followup visit in 2 weeks for evaluation and management any adverse effect of his treatment. I also discussed with him the MRI results and recommended for him repeat CT scan of the head with the upcoming restaging CT scan of the chest one month after his treatment. He was advised to call immediately if he has any concerning symptoms in the interval.  The patient voices understanding of current disease status and treatment options and is in agreement with the current care plan.  All questions were answered. The patient knows to call the clinic with any problems, questions or concerns. We can certainly  see the patient much sooner if necessary.

## 2012-12-27 NOTE — Patient Instructions (Addendum)
[facility] Discharge Instructions for Patients Receiving Chemotherapy  Today you received the following chemotherapy agents Taxol/Carbo To help prevent nausea and vomiting after your treatment, we encourage you to take your nausea medication as prescribed.    If you develop nausea and vomiting that is not controlled by your nausea medication, call the clinic.   BELOW ARE SYMPTOMS THAT SHOULD BE REPORTED IMMEDIATELY:  *FEVER GREATER THAN 100.5 F  *CHILLS WITH OR WITHOUT FEVER  NAUSEA AND VOMITING THAT IS NOT CONTROLLED WITH YOUR NAUSEA MEDICATION  *UNUSUAL SHORTNESS OF BREATH  *UNUSUAL BRUISING OR BLEEDING  TENDERNESS IN MOUTH AND THROAT WITH OR WITHOUT PRESENCE OF ULCERS  *URINARY PROBLEMS  *BOWEL PROBLEMS  UNUSUAL RASH Items with * indicate a potential emergency and should be followed up as soon as possible.  Feel free to call the clinic should you have any questions or concerns. The clinic phone number is [phone].  It was a pleasure to serve you today.     Lorazepam injection What is this medicine? LORAZEPAM (lor A ze pam) is a benzodiazepine. It is used to treat anxiety and certain types of seizures. It is also used to cause sleep before surgery and to block the memory of the procedure. This medicine may be used for other purposes; ask your health care provider or pharmacist if you have questions. COMMON BRAND NAME(S): Ativan What should I tell my health care provider before I take this medicine? They need to know if you have any of these conditions: -alcohol or drug abuse problem -bipolar disorder, depression, psychosis or other mental health condition -glaucoma -kidney or liver disease -lung disease or breathing difficulties -myasthenia gravis -Parkinson's disease -seizures or a history of seizures -suicidal thoughts -an unusual or allergic reaction to lorazepam, other benzodiazepines, foods, dyes, or preservatives -pregnant or trying to  get pregnant -breast-feeding How should I use this medicine? This medicine is for injection into a muscle or into a vein. It is given by a health care professional in a hospital or clinic setting. Talk to your pediatrician regarding the use of this medicine in children. Special care may be needed. Overdosage: If you think you have taken too much of this medicine contact a poison control center or emergency room at once. NOTE: This medicine is only for you. Do not share this medicine with others. What if I miss a dose? This does not apply. What may interact with this medicine? -barbiturate medicines for inducing sleep or treating seizures, like phenobarbital -clozapine -medicines for depression, mental problems or psychiatric disturbances -medicines for sleep -phenytoin -probenecid -theophylline -valproic acid This list may not describe all possible interactions. Give your health care provider a list of all the medicines, herbs, non-prescription drugs, or dietary supplements you use. Also tell them if you smoke, drink alcohol, or use illegal drugs. Some items may interact with your medicine. What should I watch for while using this medicine? You may feel dizzy or drowsy for about 6 to 8 hours after an injection of this medicine. Elderly patients may feel these effects more strongly and for a longer time. Do not drive, use machinery, or do anything that needs mental alertness until you know how this medicine affects you. To reduce the risk of dizzy and fainting spells, do not stand or sit up quickly, especially if you are an older patient. Alcohol may increase dizziness and drowsiness. Avoid alcoholic drinks. This medicine can cause loss of recall of recent events. This loss of memory is  only temporary. Do not treat yourself for coughs, colds, pain or allergies without asking your doctor or health care professional for advice. Some ingredients can increase possible side effects. What side  effects may I notice from receiving this medicine? Side effects that you should report to your doctor or health care professional as soon as possible: -changes in vision -confusion -depression -mood changes, excitability or aggressive behavior -movement difficulty, staggering or jerky movements -muscle cramps -restlessness -weakness or tiredness Side effects that usually do not require medical attention (report to your doctor or health care professional if they continue or are bothersome): -constipation or diarrhea -difficulty sleeping, nightmares -dizziness, drowsiness -headache -nausea, vomiting This list may not describe all possible side effects. Call your doctor for medical advice about side effects. You may report side effects to FDA at 1-800-FDA-1088. Where should I keep my medicine? This medication will be given to you in a hospital or health clinic setting. You will not be given this medicine to take home. NOTE: This sheet is a summary. It may not cover all possible information. If you have questions about this medicine, talk to your doctor, pharmacist, or health care provider.  2014, [employer]. (2007-06-28 17:13:24)

## 2012-12-28 ENCOUNTER — Telehealth: Payer: Self-pay | Admitting: *Deleted

## 2012-12-28 ENCOUNTER — Ambulatory Visit
Admission: RE | Admit: 2012-12-28 | Discharge: 2012-12-28 | Disposition: A | Discharge status: Home / Self Care | Payer: Commercial Managed Care - PPO | Source: Ambulatory Visit | Attending: Radiation Oncology | Admitting: Radiation Oncology

## 2012-12-28 NOTE — Telephone Encounter (Signed)
Per staff message and POF I have scheduled appts.  JMW  

## 2012-12-29 ENCOUNTER — Telehealth: Payer: Self-pay | Admitting: Internal Medicine

## 2012-12-29 ENCOUNTER — Ambulatory Visit
Admission: RE | Admit: 2012-12-29 | Discharge: 2012-12-29 | Disposition: A | Discharge status: Home / Self Care | Payer: Commercial Managed Care - PPO | Source: Ambulatory Visit | Attending: Radiation Oncology | Admitting: Radiation Oncology

## 2012-12-29 NOTE — Telephone Encounter (Signed)
s.w. pt and advised on DEC adn Jan appts....pt ok and awre.Marland KitchenMarland Kitchenpt will ck mychart.

## 2012-12-31 ENCOUNTER — Ambulatory Visit
Admission: RE | Admit: 2012-12-31 | Discharge: 2012-12-31 | Disposition: A | Discharge status: Home / Self Care | Payer: Commercial Managed Care - PPO | Source: Ambulatory Visit | Attending: Radiation Oncology | Admitting: Radiation Oncology

## 2012-12-31 ENCOUNTER — Encounter: Payer: Self-pay | Admitting: Radiation Oncology

## 2012-12-31 NOTE — Progress Notes (Signed)
  Radiation Oncology         [phone] ________________________________  Name: [name] MRN: [id number]  Date: 12/31/2012  DOB: 09-04-1950  Weekly Radiation Therapy Management  Current Dose: 18 Gy     Planned Dose:  66 Gy  Narrative . . . . . . . . The patient presents for routine under treatment assessment.                                   The patient is without complaint.                                 Set-up films were reviewed.                                 The chart was checked. Physical Findings. . .  weight is 160 lb 4.8 oz (72.712 kg). His oral temperature is 97.7 F (36.5 C). His blood pressure is 105/70 and his pulse is 82. His respiration is 16 and oxygen saturation is 99%. . Weight essentially stable.  No significant changes. Impression . . . . . . . The patient is tolerating radiation. Plan . . . . . . . . . . . . Continue treatment as planned.  ________________________________  Sheral Apley. Tammi Klippel, M.D.

## 2012-12-31 NOTE — Progress Notes (Signed)
Weekly rad tx lt lung, 99% room air, no coughing, slight nausea this am,resolvd, no c/o pain sob, eating well, no difficulty swallowing,  Vitals wnl,  8:54 AM

## 2013-01-03 ENCOUNTER — Other Ambulatory Visit: Payer: Commercial Managed Care - PPO

## 2013-01-03 ENCOUNTER — Other Ambulatory Visit (HOSPITAL_BASED_OUTPATIENT_CLINIC_OR_DEPARTMENT_OTHER): Payer: Commercial Managed Care - PPO

## 2013-01-03 ENCOUNTER — Ambulatory Visit (HOSPITAL_BASED_OUTPATIENT_CLINIC_OR_DEPARTMENT_OTHER): Payer: Commercial Managed Care - PPO

## 2013-01-03 ENCOUNTER — Ambulatory Visit
Admission: RE | Admit: 2013-01-03 | Discharge: 2013-01-03 | Disposition: A | Discharge status: Home / Self Care | Payer: Commercial Managed Care - PPO | Source: Ambulatory Visit | Attending: Radiation Oncology | Admitting: Radiation Oncology

## 2013-01-03 DIAGNOSIS — C3410 Malignant neoplasm of upper lobe, unspecified bronchus or lung: Secondary | ICD-10-CM

## 2013-01-03 DIAGNOSIS — Z5111 Encounter for antineoplastic chemotherapy: Secondary | ICD-10-CM

## 2013-01-03 LAB — CBC WITH DIFFERENTIAL/PLATELET
BASO%: 0.3 % (ref 0.0–2.0)
Basophils Absolute: 0 10*3/uL (ref 0.0–0.1)
Eosinophils Absolute: 0.1 10*3/uL (ref 0.0–0.5)
HGB: 12.9 g/dL — ABNORMAL LOW (ref 13.0–17.1)
LYMPH%: 8.7 % — ABNORMAL LOW (ref 14.0–49.0)
MCHC: 32.7 g/dL (ref 32.0–36.0)
MONO#: 0.5 10*3/uL (ref 0.1–0.9)
MONO%: 6.9 % (ref 0.0–14.0)
NEUT#: 6 10*3/uL (ref 1.5–6.5)
Platelets: 271 10*3/uL (ref 140–400)
RBC: 4.54 10*6/uL (ref 4.20–5.82)
RDW: 15.2 % — ABNORMAL HIGH (ref 11.0–14.6)
WBC: 7.3 10*3/uL (ref 4.0–10.3)
lymph#: 0.6 10*3/uL — ABNORMAL LOW (ref 0.9–3.3)
nRBC: 0 % (ref 0–0)

## 2013-01-03 LAB — COMPREHENSIVE METABOLIC PANEL (CC13)
ALT: 12 U/L (ref 0–55)
Anion Gap: 8 mEq/L (ref 3–11)
BUN: 8.6 mg/dL (ref 7.0–26.0)
CO2: 25 mEq/L (ref 22–29)
Calcium: 9.3 mg/dL (ref 8.4–10.4)
Chloride: 104 mEq/L (ref 98–109)
Sodium: 138 mEq/L (ref 136–145)
Total Bilirubin: 0.39 mg/dL (ref 0.20–1.20)
Total Protein: 6.9 g/dL (ref 6.4–8.3)

## 2013-01-03 MED ORDER — ONDANSETRON 16 MG/50ML IVPB (CHCC)
INTRAVENOUS | Status: AC
  Filled 2013-01-03: qty 16

## 2013-01-03 MED ORDER — DEXAMETHASONE SODIUM PHOSPHATE 20 MG/5ML IJ SOLN
20.0000 mg | Freq: Once | INTRAMUSCULAR | Status: AC
  Administered 2013-01-03: 20 mg via INTRAVENOUS

## 2013-01-03 MED ORDER — DIPHENHYDRAMINE HCL 50 MG/ML IJ SOLN
INTRAMUSCULAR | Status: AC
  Filled 2013-01-03: qty 1

## 2013-01-03 MED ORDER — DIPHENHYDRAMINE HCL 50 MG/ML IJ SOLN
25.0000 mg | Freq: Once | INTRAMUSCULAR | Status: AC
  Administered 2013-01-03: 25 mg via INTRAVENOUS

## 2013-01-03 MED ORDER — DEXAMETHASONE SODIUM PHOSPHATE 20 MG/5ML IJ SOLN
INTRAMUSCULAR | Status: AC
  Filled 2013-01-03: qty 5

## 2013-01-03 MED ORDER — FAMOTIDINE IN NACL 20-0.9 MG/50ML-% IV SOLN
INTRAVENOUS | Status: AC
  Filled 2013-01-03: qty 50

## 2013-01-03 MED ORDER — SODIUM CHLORIDE 0.9 % IV SOLN
240.8000 mg | Freq: Once | INTRAVENOUS | Status: AC
  Administered 2013-01-03: 240 mg via INTRAVENOUS
  Filled 2013-01-03: qty 24

## 2013-01-03 MED ORDER — SODIUM CHLORIDE 0.9 % IV SOLN
45.0000 mg/m2 | Freq: Once | INTRAVENOUS | Status: AC
  Administered 2013-01-03: 84 mg via INTRAVENOUS
  Filled 2013-01-03: qty 14

## 2013-01-03 MED ORDER — ONDANSETRON 16 MG/50ML IVPB (CHCC)
16.0000 mg | Freq: Once | INTRAVENOUS | Status: AC
  Administered 2013-01-03: 16 mg via INTRAVENOUS

## 2013-01-03 MED ORDER — SODIUM CHLORIDE 0.9 % IV SOLN
Freq: Once | INTRAVENOUS | Status: AC
  Administered 2013-01-03: 10:00:00 via INTRAVENOUS

## 2013-01-03 MED ORDER — FAMOTIDINE IN NACL 20-0.9 MG/50ML-% IV SOLN
20.0000 mg | Freq: Once | INTRAVENOUS | Status: AC
  Administered 2013-01-03: 20 mg via INTRAVENOUS

## 2013-01-03 NOTE — Patient Instructions (Signed)
[facility] Discharge Instructions for Patients Receiving Chemotherapy  Today you received the following chemotherapy agents Taxol/Carboplatin To help prevent nausea and vomiting after your treatment, we encourage you to take your nausea medication as prescribed.  If you develop nausea and vomiting that is not controlled by your nausea medication, call the clinic.   BELOW ARE SYMPTOMS THAT SHOULD BE REPORTED IMMEDIATELY:  *FEVER GREATER THAN 100.5 F  *CHILLS WITH OR WITHOUT FEVER  NAUSEA AND VOMITING THAT IS NOT CONTROLLED WITH YOUR NAUSEA MEDICATION  *UNUSUAL SHORTNESS OF BREATH  *UNUSUAL BRUISING OR BLEEDING  TENDERNESS IN MOUTH AND THROAT WITH OR WITHOUT PRESENCE OF ULCERS  *URINARY PROBLEMS  *BOWEL PROBLEMS  UNUSUAL RASH Items with * indicate a potential emergency and should be followed up as soon as possible.  Feel free to call the clinic you have any questions or concerns. The clinic phone number is [phone].

## 2013-01-04 ENCOUNTER — Ambulatory Visit
Admission: RE | Admit: 2013-01-04 | Discharge: 2013-01-04 | Disposition: A | Discharge status: Home / Self Care | Payer: Commercial Managed Care - PPO | Source: Ambulatory Visit | Attending: Radiation Oncology | Admitting: Radiation Oncology

## 2013-01-05 ENCOUNTER — Ambulatory Visit
Admission: RE | Admit: 2013-01-05 | Discharge: 2013-01-05 | Disposition: A | Discharge status: Home / Self Care | Payer: Commercial Managed Care - PPO | Source: Ambulatory Visit | Attending: Radiation Oncology | Admitting: Radiation Oncology

## 2013-01-07 ENCOUNTER — Ambulatory Visit
Admission: RE | Admit: 2013-01-07 | Discharge: 2013-01-07 | Disposition: A | Discharge status: Home / Self Care | Payer: Commercial Managed Care - PPO | Source: Ambulatory Visit | Attending: Radiation Oncology | Admitting: Radiation Oncology

## 2013-01-07 ENCOUNTER — Encounter: Payer: Self-pay | Admitting: Radiation Oncology

## 2013-01-07 NOTE — Progress Notes (Signed)
   Department of Radiation Oncology  Phone:  [phone] Fax:        [phone]  Weekly Treatment Note    Name: [name] Date: 01/07/2013 MRN: [id number] DOB: 1950-12-11   Current dose: 26 Gy  Current fraction: 13   MEDICATIONS: Current Outpatient Prescriptions  Medication Sig Dispense Refill  . Astaxanthin 4 MG CAPS Take 1 tablet by mouth daily. With 325 mg of ala      . budesonide-formoterol (SYMBICORT) 160-4.5 MCG/ACT inhaler Inhale 2 puffs into the lungs 2 (two) times daily.  1 Inhaler  12  . Cholecalciferol (VITAMIN D-3 PO) Take 8,000 Units by mouth daily.      Marland Kitchen KRILL OIL PO Take 2 capsules by mouth daily.      . Menaquinone-7 (VITAMIN K2 PO) Take 150 mcg by mouth daily.      . Probiotic Product (PROBIOTIC DAILY PO) Take 1 capsule by mouth daily.      Marland Kitchen RESVERATROL PO Take by mouth daily.      . TURMERIC CURCUMIN PO Take by mouth daily.      . vitamin E 200 UNIT capsule Take by mouth daily.      . prochlorperazine (COMPAZINE) 10 MG tablet Take 1 tablet (10 mg total) by mouth every 6 (six) hours as needed for nausea or vomiting.  60 tablet  0   No current facility-administered medications for this encounter.     ALLERGIES: Review of patient's allergies indicates no known allergies.   LABORATORY DATA:  Lab Results  Component Value Date   WBC 7.3 01/03/2013   HGB 12.9* 01/03/2013   HCT 39.5 01/03/2013   MCV 87.0 01/03/2013   PLT 271 01/03/2013   Lab Results  Component Value Date   NA 138 01/03/2013   K 4.3 01/03/2013   CL 102 11/26/2012   CO2 25 01/03/2013   Lab Results  Component Value Date   ALT 12 01/03/2013   AST 8 01/03/2013   ALKPHOS 89 01/03/2013   BILITOT 0.39 01/03/2013     NARRATIVE: [name] was seen today for weekly treatment management. The chart was checked and the patient's films were reviewed. The patient is doing well at this time. He essentially is without complaints. No difficulty swallowing. No change in  breathing.  PHYSICAL EXAMINATION: blood pressure is 127/73 and his pulse is 82. His respiration is 16 and oxygen saturation is 100%.        ASSESSMENT: The patient is doing satisfactorily with treatment.  PLAN: We will continue with the patient's radiation treatment as planned.

## 2013-01-07 NOTE — Progress Notes (Signed)
Patient is without complaints today. Patient denies pain. Patient denies difficulty swallowing, cough or shortness of breath.

## 2013-01-10 ENCOUNTER — Ambulatory Visit (HOSPITAL_BASED_OUTPATIENT_CLINIC_OR_DEPARTMENT_OTHER): Payer: Commercial Managed Care - PPO | Admitting: Internal Medicine

## 2013-01-10 ENCOUNTER — Ambulatory Visit
Admission: RE | Admit: 2013-01-10 | Discharge: 2013-01-10 | Disposition: A | Discharge status: Home / Self Care | Payer: Commercial Managed Care - PPO | Source: Ambulatory Visit | Attending: Radiation Oncology | Admitting: Radiation Oncology

## 2013-01-10 ENCOUNTER — Ambulatory Visit (HOSPITAL_BASED_OUTPATIENT_CLINIC_OR_DEPARTMENT_OTHER): Payer: Commercial Managed Care - PPO

## 2013-01-10 ENCOUNTER — Other Ambulatory Visit: Payer: Commercial Managed Care - PPO

## 2013-01-10 ENCOUNTER — Telehealth: Payer: Self-pay | Admitting: *Deleted

## 2013-01-10 ENCOUNTER — Encounter: Payer: Self-pay | Admitting: Internal Medicine

## 2013-01-10 ENCOUNTER — Other Ambulatory Visit (HOSPITAL_BASED_OUTPATIENT_CLINIC_OR_DEPARTMENT_OTHER): Payer: Commercial Managed Care - PPO

## 2013-01-10 DIAGNOSIS — C3410 Malignant neoplasm of upper lobe, unspecified bronchus or lung: Secondary | ICD-10-CM

## 2013-01-10 DIAGNOSIS — Z5111 Encounter for antineoplastic chemotherapy: Secondary | ICD-10-CM

## 2013-01-10 LAB — CBC WITH DIFFERENTIAL/PLATELET
BASO%: 0.5 % (ref 0.0–2.0)
BASOS ABS: 0 10*3/uL (ref 0.0–0.1)
EOS ABS: 0 10*3/uL (ref 0.0–0.5)
EOS%: 0.7 % (ref 0.0–7.0)
HCT: 39.7 % (ref 38.4–49.9)
HEMOGLOBIN: 12.8 g/dL — AB (ref 13.0–17.1)
LYMPH#: 0.6 10*3/uL — AB (ref 0.9–3.3)
LYMPH%: 10 % — ABNORMAL LOW (ref 14.0–49.0)
MCH: 29.1 pg (ref 27.2–33.4)
MCHC: 32.2 g/dL (ref 32.0–36.0)
MCV: 90.2 fL (ref 79.3–98.0)
MONO#: 0.6 10*3/uL (ref 0.1–0.9)
MONO%: 10 % (ref 0.0–14.0)
NEUT%: 78.8 % — ABNORMAL HIGH (ref 39.0–75.0)
NEUTROS ABS: 4.8 10*3/uL (ref 1.5–6.5)
NRBC: 0 % (ref 0–0)
Platelets: 230 10*3/uL (ref 140–400)
RBC: 4.4 10*6/uL (ref 4.20–5.82)
RDW: 16.3 % — AB (ref 11.0–14.6)
WBC: 6.1 10*3/uL (ref 4.0–10.3)

## 2013-01-10 LAB — COMPREHENSIVE METABOLIC PANEL (CC13)
ALBUMIN: 3.5 g/dL (ref 3.5–5.0)
ALT: 13 U/L (ref 0–55)
ANION GAP: 11 meq/L (ref 3–11)
AST: 12 U/L (ref 5–34)
Alkaline Phosphatase: 92 U/L (ref 40–150)
BUN: 10.1 mg/dL (ref 7.0–26.0)
CALCIUM: 9.4 mg/dL (ref 8.4–10.4)
CHLORIDE: 105 meq/L (ref 98–109)
CO2: 21 meq/L — AB (ref 22–29)
Creatinine: 0.7 mg/dL (ref 0.7–1.3)
GLUCOSE: 85 mg/dL (ref 70–140)
POTASSIUM: 4.7 meq/L (ref 3.5–5.1)
Sodium: 137 mEq/L (ref 136–145)
Total Bilirubin: 0.53 mg/dL (ref 0.20–1.20)
Total Protein: 7.3 g/dL (ref 6.4–8.3)

## 2013-01-10 MED ORDER — SODIUM CHLORIDE 0.9 % IV SOLN
240.8000 mg | Freq: Once | INTRAVENOUS | Status: AC
  Administered 2013-01-10: 240 mg via INTRAVENOUS
  Filled 2013-01-10: qty 24

## 2013-01-10 MED ORDER — ONDANSETRON 16 MG/50ML IVPB (CHCC)
16.0000 mg | Freq: Once | INTRAVENOUS | Status: AC
  Administered 2013-01-10: 16 mg via INTRAVENOUS

## 2013-01-10 MED ORDER — ONDANSETRON 16 MG/50ML IVPB (CHCC)
INTRAVENOUS | Status: AC
  Filled 2013-01-10: qty 16

## 2013-01-10 MED ORDER — DIPHENHYDRAMINE HCL 50 MG/ML IJ SOLN
25.0000 mg | Freq: Once | INTRAMUSCULAR | Status: AC
  Administered 2013-01-10: 25 mg via INTRAVENOUS

## 2013-01-10 MED ORDER — DEXAMETHASONE SODIUM PHOSPHATE 20 MG/5ML IJ SOLN
INTRAMUSCULAR | Status: AC
  Filled 2013-01-10: qty 5

## 2013-01-10 MED ORDER — SODIUM CHLORIDE 0.9 % IV SOLN
Freq: Once | INTRAVENOUS | Status: AC
  Administered 2013-01-10: 12:00:00 via INTRAVENOUS

## 2013-01-10 MED ORDER — FAMOTIDINE IN NACL 20-0.9 MG/50ML-% IV SOLN
INTRAVENOUS | Status: AC
  Filled 2013-01-10: qty 50

## 2013-01-10 MED ORDER — DIPHENHYDRAMINE HCL 50 MG/ML IJ SOLN
INTRAMUSCULAR | Status: AC
  Filled 2013-01-10: qty 1

## 2013-01-10 MED ORDER — SODIUM CHLORIDE 0.9 % IV SOLN
45.0000 mg/m2 | Freq: Once | INTRAVENOUS | Status: AC
  Administered 2013-01-10: 84 mg via INTRAVENOUS
  Filled 2013-01-10: qty 14

## 2013-01-10 MED ORDER — FAMOTIDINE IN NACL 20-0.9 MG/50ML-% IV SOLN
20.0000 mg | Freq: Once | INTRAVENOUS | Status: AC
  Administered 2013-01-10: 20 mg via INTRAVENOUS

## 2013-01-10 MED ORDER — DEXAMETHASONE SODIUM PHOSPHATE 20 MG/5ML IJ SOLN
20.0000 mg | Freq: Once | INTRAMUSCULAR | Status: AC
  Administered 2013-01-10: 20 mg via INTRAVENOUS

## 2013-01-10 NOTE — Telephone Encounter (Signed)
Per staff message and POF I have scheduled appts.  JMW  

## 2013-01-10 NOTE — Patient Instructions (Signed)
Followup visit in 2 weeks.

## 2013-01-10 NOTE — Patient Instructions (Signed)
[facility] Discharge Instructions for Patients Receiving Chemotherapy  Today you received the following chemotherapy agents: Taxol/ Carboplatin  To help prevent nausea and vomiting after your treatment, we encourage you to take your nausea medication as needed.   If you develop nausea and vomiting that is not controlled by your nausea medication, call the clinic.   BELOW ARE SYMPTOMS THAT SHOULD BE REPORTED IMMEDIATELY:  *FEVER GREATER THAN 100.5 F  *CHILLS WITH OR WITHOUT FEVER  NAUSEA AND VOMITING THAT IS NOT CONTROLLED WITH YOUR NAUSEA MEDICATION  *UNUSUAL SHORTNESS OF BREATH  *UNUSUAL BRUISING OR BLEEDING  TENDERNESS IN MOUTH AND THROAT WITH OR WITHOUT PRESENCE OF ULCERS  *URINARY PROBLEMS  *BOWEL PROBLEMS  UNUSUAL RASH Items with * indicate a potential emergency and should be followed up as soon as possible.  Feel free to call the clinic you have any questions or concerns. The clinic phone number is [phone].

## 2013-01-10 NOTE — Progress Notes (Signed)
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  PRIOR THERAPY:None.  CURRENT THERAPY: Concurrent chemoradiation with weekly carboplatin for AUC of 2 and paclitaxel 45 mg/M2, status post 3cycle.  CHEMOTHERAPY INTENT: Curative  CURRENT # OF CHEMOTHERAPY CYCLES: 4  CURRENT ANTIEMETICS: Zofran, Compazine and Decadron  CURRENT SMOKING STATUS: Current smoker. Smoke cessation consult was done.  ORAL CHEMOTHERAPY AND CONSENT: None  CURRENT BISPHOSPHONATES USE: None  PAIN MANAGEMENT: 0/10  NARCOTICS INDUCED CONSTIPATION: None  LIVING WILL AND CODE STATUS: Full code  INTERVAL HISTORY: [name] 63 y.o. male returns to the clinic today for followup visit accompanied by his wife. He tolerated the last dose of his concurrent chemoradiation fairly well with no significant complaints.  The patient had some restless in his legs after treatment with Benadryl for premedication of the Taxol treatment. This improved after we reduce the dose of Benadryl to 25 mg. The patient denied having any significant chest pain, shortness of breath or hemoptysis. He denied having any fever or chills. He denied having any significant weight loss or night sweats. He also complained of insomnia but does not want to take any medication to help him to sleep.  MEDICAL HISTORY: Past Medical History  Diagnosis Date  . Testicular cancer 2000    s/p resection, no recurrence  . Crohn's disease     ALLERGIES:  has No Known Allergies.  MEDICATIONS:  Current Outpatient Prescriptions  Medication Sig Dispense Refill  . Astaxanthin 4 MG CAPS Take 1 tablet by mouth daily. With 325 mg of ala      . Cholecalciferol  (VITAMIN D-3 PO) Take 8,000 Units by mouth daily.      Marland Kitchen KRILL OIL PO Take 2 capsules by mouth daily.      . Menaquinone-7 (VITAMIN K2 PO) Take 150 mcg by mouth daily.      . Probiotic Product (PROBIOTIC DAILY PO) Take 1 capsule by mouth daily.      Marland Kitchen RESVERATROL PO Take by mouth daily.      . TURMERIC CURCUMIN PO Take by mouth daily.      . vitamin E 200 UNIT capsule Take by mouth daily.      . budesonide-formoterol (SYMBICORT) 160-4.5 MCG/ACT inhaler Inhale 2 puffs into the lungs 2 (two) times daily.  1 Inhaler  12  . prochlorperazine (COMPAZINE) 10 MG tablet Take 1 tablet (10 mg total) by mouth every 6 (six) hours as needed for nausea or vomiting.  60 tablet  0   No current facility-administered medications for this visit.    SURGICAL HISTORY:  Past Surgical History  Procedure Laterality Date  . Testicular removal  2000  . Appendectomy    . Hernia repair    . Video bronchoscopy N/A 11/30/2012    Procedure: VIDEO BRONCHOSCOPY WITH FLUORO;  Surgeon: Elsie Stain, MD;  Location: [facility];  Service: Cardiopulmonary;  Laterality: N/A;    REVIEW OF SYSTEMS:  A comprehensive review of systems was negative except for: Behavioral/Psych: positive for sleep disturbance   PHYSICAL EXAMINATION: General appearance: alert, cooperative, fatigued and no distress Head: Normocephalic, without obvious abnormality, atraumatic Neck: no adenopathy, no JVD, supple, symmetrical, trachea midline and thyroid not enlarged, symmetric, no tenderness/mass/nodules Lymph  nodes: Cervical, supraclavicular, and axillary nodes normal. Resp: clear to auscultation bilaterally Back: symmetric, no curvature. ROM normal. No CVA tenderness. Cardio: regular rate and rhythm, S1, S2 normal, no murmur, click, rub or gallop GI: soft, non-tender; bowel sounds normal; no masses,  no organomegaly Extremities: extremities normal, atraumatic, no cyanosis or edema  ECOG PERFORMANCE STATUS: 1 - Symptomatic but completely  ambulatory  Blood pressure 111/73, pulse 84, temperature 96.8 F (36 C), temperature source Oral, resp. rate 18, height 5' 9"  (1.753 m), weight 160 lb 12.8 oz (72.938 kg).  LABORATORY DATA: Lab Results  Component Value Date   WBC 6.1 01/10/2013   HGB 12.8* 01/10/2013   HCT 39.7 01/10/2013   MCV 90.2 01/10/2013   PLT 230 01/10/2013      Chemistry      Component Value Date/Time   NA 138 01/03/2013 0910   NA 136 11/26/2012 1140   K 4.3 01/03/2013 0910   K 4.2 11/26/2012 1140   CL 102 11/26/2012 1140   CO2 25 01/03/2013 0910   CO2 29 11/26/2012 1140   BUN 8.6 01/03/2013 0910   BUN 9 11/26/2012 1140   CREATININE 0.8 01/03/2013 0910   CREATININE 0.8 11/26/2012 1140      Component Value Date/Time   CALCIUM 9.3 01/03/2013 0910   CALCIUM 9.1 11/26/2012 1140   ALKPHOS 89 01/03/2013 0910   ALKPHOS 96 07/02/2009 1157   AST 8 01/03/2013 0910   AST 14 07/02/2009 1157   ALT 12 01/03/2013 0910   ALT 11 07/02/2009 1157   BILITOT 0.39 01/03/2013 0910   BILITOT 0.6 07/02/2009 1157       RADIOGRAPHIC STUDIES:  ASSESSMENT AND PLAN: This is a very pleasant 63 years old white male recently diagnosed with a stage IIIA non-small cell lung cancer currently undergoing concurrent chemoradiation with weekly carboplatin and paclitaxel status post 3 cycle. The patient is tolerating his treatment fairly well with no significant adverse effects. I recommended for him to continue his current treatment as scheduled. He would come back for followup visit in 2 weeks for evaluation and management any adverse effect of his treatment. He was advised to call immediately if he has any concerning symptoms in the interval.  The patient voices understanding of current disease status and treatment options and is in agreement with the current care plan.  All questions were answered. The patient knows to call the clinic with any problems, questions or concerns. We can certainly see the patient much sooner if necessary.

## 2013-01-11 ENCOUNTER — Ambulatory Visit
Admission: RE | Admit: 2013-01-11 | Discharge: 2013-01-11 | Disposition: A | Discharge status: Home / Self Care | Payer: Commercial Managed Care - PPO | Source: Ambulatory Visit | Attending: Radiation Oncology | Admitting: Radiation Oncology

## 2013-01-12 ENCOUNTER — Ambulatory Visit
Admission: RE | Admit: 2013-01-12 | Discharge: 2013-01-12 | Disposition: A | Discharge status: Home / Self Care | Payer: Commercial Managed Care - PPO | Source: Ambulatory Visit | Attending: Radiation Oncology | Admitting: Radiation Oncology

## 2013-01-12 LAB — AFB CULTURE WITH SMEAR (NOT AT ARMC)
Acid Fast Smear: NONE SEEN
Special Requests: NORMAL

## 2013-01-13 ENCOUNTER — Ambulatory Visit
Admission: RE | Admit: 2013-01-13 | Discharge: 2013-01-13 | Disposition: A | Discharge status: Home / Self Care | Payer: Commercial Managed Care - PPO | Source: Ambulatory Visit | Attending: Radiation Oncology | Admitting: Radiation Oncology

## 2013-01-13 ENCOUNTER — Encounter: Payer: Self-pay | Admitting: Radiation Oncology

## 2013-01-13 NOTE — Progress Notes (Signed)
  Radiation Oncology         [phone] ________________________________  Name: [name] MRN: [id number]  Date: 01/13/2013  DOB: May 21, 1950  Weekly Radiation Therapy Management  Current Dose: 34 Gy     Planned Dose:  66 Gy  Narrative . . . . . . . . The patient presents for routine under treatment assessment.                                   The patient is without complaint.                                 Set-up films were reviewed.                                 The chart was checked. Physical Findings. . .  weight is 160 lb 4.8 oz (72.712 kg). His blood pressure is 120/76 and his pulse is 85. His respiration is 16 and oxygen saturation is 98%. . Weight essentially stable.  No significant changes. Impression . . . . . . . The patient is tolerating radiation. Plan . . . . . . . . . . . . Continue treatment as planned.  ________________________________  Sheral Apley. Tammi Klippel, M.D.

## 2013-01-13 NOTE — Progress Notes (Signed)
Reports only an occasional cough with clear sputum. Denies shortness of breath or difficulty swallowing. Denies fatigue. Mild hyperpigmentation of left upper back noted. Reports using radiaplex to chest and back bid as directed. No complaints at this time.

## 2013-01-14 ENCOUNTER — Ambulatory Visit
Admission: RE | Admit: 2013-01-14 | Discharge: 2013-01-14 | Disposition: A | Discharge status: Home / Self Care | Payer: Commercial Managed Care - PPO | Source: Ambulatory Visit | Attending: Radiation Oncology | Admitting: Radiation Oncology

## 2013-01-17 ENCOUNTER — Ambulatory Visit (HOSPITAL_BASED_OUTPATIENT_CLINIC_OR_DEPARTMENT_OTHER): Payer: Commercial Managed Care - PPO

## 2013-01-17 ENCOUNTER — Other Ambulatory Visit: Payer: Self-pay | Admitting: *Deleted

## 2013-01-17 ENCOUNTER — Other Ambulatory Visit (HOSPITAL_BASED_OUTPATIENT_CLINIC_OR_DEPARTMENT_OTHER): Payer: Commercial Managed Care - PPO

## 2013-01-17 ENCOUNTER — Other Ambulatory Visit: Payer: Commercial Managed Care - PPO

## 2013-01-17 ENCOUNTER — Ambulatory Visit
Admission: RE | Admit: 2013-01-17 | Discharge: 2013-01-17 | Disposition: A | Discharge status: Home / Self Care | Payer: Commercial Managed Care - PPO | Source: Ambulatory Visit | Attending: Radiation Oncology | Admitting: Radiation Oncology

## 2013-01-17 DIAGNOSIS — C3410 Malignant neoplasm of upper lobe, unspecified bronchus or lung: Secondary | ICD-10-CM

## 2013-01-17 DIAGNOSIS — Z5111 Encounter for antineoplastic chemotherapy: Secondary | ICD-10-CM

## 2013-01-17 LAB — CBC WITH DIFFERENTIAL/PLATELET
BASO%: 0.2 % (ref 0.0–2.0)
Basophils Absolute: 0 10*3/uL (ref 0.0–0.1)
EOS%: 0.8 % (ref 0.0–7.0)
Eosinophils Absolute: 0 10*3/uL (ref 0.0–0.5)
HCT: 37.7 % — ABNORMAL LOW (ref 38.4–49.9)
HGB: 12.5 g/dL — ABNORMAL LOW (ref 13.0–17.1)
LYMPH%: 8.7 % — ABNORMAL LOW (ref 14.0–49.0)
MCH: 29.2 pg (ref 27.2–33.4)
MCHC: 33.2 g/dL (ref 32.0–36.0)
MCV: 88.1 fL (ref 79.3–98.0)
MONO#: 0.3 10*3/uL (ref 0.1–0.9)
MONO%: 6 % (ref 0.0–14.0)
NEUT#: 4.5 10*3/uL (ref 1.5–6.5)
NEUT%: 84.3 % — ABNORMAL HIGH (ref 39.0–75.0)
Platelets: 164 10*3/uL (ref 140–400)
RBC: 4.28 10*6/uL (ref 4.20–5.82)
RDW: 16.8 % — ABNORMAL HIGH (ref 11.0–14.6)
WBC: 5.3 10*3/uL (ref 4.0–10.3)
lymph#: 0.5 10*3/uL — ABNORMAL LOW (ref 0.9–3.3)

## 2013-01-17 LAB — COMPREHENSIVE METABOLIC PANEL (CC13)
ALBUMIN: 3.4 g/dL — AB (ref 3.5–5.0)
ALT: 10 U/L (ref 0–55)
AST: 8 U/L (ref 5–34)
Alkaline Phosphatase: 96 U/L (ref 40–150)
Anion Gap: 10 mEq/L (ref 3–11)
BUN: 8.1 mg/dL (ref 7.0–26.0)
CO2: 24 mEq/L (ref 22–29)
Calcium: 9.7 mg/dL (ref 8.4–10.4)
Chloride: 102 mEq/L (ref 98–109)
Creatinine: 0.8 mg/dL (ref 0.7–1.3)
Glucose: 83 mg/dl (ref 70–140)
POTASSIUM: 4.5 meq/L (ref 3.5–5.1)
SODIUM: 136 meq/L (ref 136–145)
Total Bilirubin: 0.77 mg/dL (ref 0.20–1.20)
Total Protein: 7.2 g/dL (ref 6.4–8.3)

## 2013-01-17 MED ORDER — DEXAMETHASONE SODIUM PHOSPHATE 20 MG/5ML IJ SOLN
INTRAMUSCULAR | Status: AC
  Filled 2013-01-17: qty 5

## 2013-01-17 MED ORDER — FAMOTIDINE IN NACL 20-0.9 MG/50ML-% IV SOLN
20.0000 mg | Freq: Once | INTRAVENOUS | Status: AC
  Administered 2013-01-17: 20 mg via INTRAVENOUS

## 2013-01-17 MED ORDER — PACLITAXEL CHEMO INJECTION 300 MG/50ML
45.0000 mg/m2 | Freq: Once | INTRAVENOUS | Status: AC
  Administered 2013-01-17: 84 mg via INTRAVENOUS
  Filled 2013-01-17: qty 14

## 2013-01-17 MED ORDER — DEXAMETHASONE SODIUM PHOSPHATE 20 MG/5ML IJ SOLN
20.0000 mg | Freq: Once | INTRAMUSCULAR | Status: AC
  Administered 2013-01-17: 20 mg via INTRAVENOUS

## 2013-01-17 MED ORDER — DIPHENHYDRAMINE HCL 50 MG/ML IJ SOLN
25.0000 mg | Freq: Once | INTRAMUSCULAR | Status: AC
  Administered 2013-01-17: 25 mg via INTRAVENOUS

## 2013-01-17 MED ORDER — FAMOTIDINE IN NACL 20-0.9 MG/50ML-% IV SOLN
INTRAVENOUS | Status: AC
  Filled 2013-01-17: qty 50

## 2013-01-17 MED ORDER — SODIUM CHLORIDE 0.9 % IV SOLN
240.8000 mg | Freq: Once | INTRAVENOUS | Status: AC
  Administered 2013-01-17: 240 mg via INTRAVENOUS
  Filled 2013-01-17: qty 24

## 2013-01-17 MED ORDER — SODIUM CHLORIDE 0.9 % IV SOLN
Freq: Once | INTRAVENOUS | Status: AC
  Administered 2013-01-17: 10:00:00 via INTRAVENOUS

## 2013-01-17 MED ORDER — ONDANSETRON 16 MG/50ML IVPB (CHCC)
INTRAVENOUS | Status: AC
  Filled 2013-01-17: qty 16

## 2013-01-17 MED ORDER — DIPHENHYDRAMINE HCL 50 MG/ML IJ SOLN
INTRAMUSCULAR | Status: AC
  Filled 2013-01-17: qty 1

## 2013-01-17 MED ORDER — ONDANSETRON 16 MG/50ML IVPB (CHCC)
16.0000 mg | Freq: Once | INTRAVENOUS | Status: AC
  Administered 2013-01-17: 16 mg via INTRAVENOUS

## 2013-01-17 NOTE — Patient Instructions (Signed)
[facility] Discharge Instructions for Patients Receiving Chemotherapy  Today you received the following chemotherapy agents Taxol/Carboplatin.  To help prevent nausea and vomiting after your treatment, we encourage you to take your nausea medication as prescribed.   If you develop nausea and vomiting that is not controlled by your nausea medication, call the clinic.   BELOW ARE SYMPTOMS THAT SHOULD BE REPORTED IMMEDIATELY:  *FEVER GREATER THAN 100.5 F  *CHILLS WITH OR WITHOUT FEVER  NAUSEA AND VOMITING THAT IS NOT CONTROLLED WITH YOUR NAUSEA MEDICATION  *UNUSUAL SHORTNESS OF BREATH  *UNUSUAL BRUISING OR BLEEDING  TENDERNESS IN MOUTH AND THROAT WITH OR WITHOUT PRESENCE OF ULCERS  *URINARY PROBLEMS  *BOWEL PROBLEMS  UNUSUAL RASH Items with * indicate a potential emergency and should be followed up as soon as possible.  Feel free to call the clinic you have any questions or concerns. The clinic phone number is [phone].

## 2013-01-18 ENCOUNTER — Ambulatory Visit
Admission: RE | Admit: 2013-01-18 | Discharge: 2013-01-18 | Disposition: A | Discharge status: Home / Self Care | Payer: Commercial Managed Care - PPO | Source: Ambulatory Visit | Attending: Radiation Oncology | Admitting: Radiation Oncology

## 2013-01-19 ENCOUNTER — Ambulatory Visit
Admission: RE | Admit: 2013-01-19 | Discharge: 2013-01-19 | Disposition: A | Discharge status: Home / Self Care | Payer: Commercial Managed Care - PPO | Source: Ambulatory Visit | Attending: Radiation Oncology | Admitting: Radiation Oncology

## 2013-01-19 ENCOUNTER — Encounter: Payer: Self-pay | Admitting: Radiation Oncology

## 2013-01-19 VITALS — BP 119/74 | HR 89 | Wt 162.5 lb

## 2013-01-19 DIAGNOSIS — C3410 Malignant neoplasm of upper lobe, unspecified bronchus or lung: Secondary | ICD-10-CM

## 2013-01-19 MED ORDER — SUCRALFATE 1 G PO TABS: 1.0000 g | ORAL_TABLET | Freq: Three times a day (TID) | ORAL | Status: DC

## 2013-01-19 NOTE — Progress Notes (Signed)
Reports only an occasional cough with clear sputum. Denies shortness of breath. Reports difficulty swallowing began Saturday.  Denies fatigue. Mild hyperpigmentation of left upper back noted. Reports using radiaplex to chest and back bid as directed. No complaints at this time.

## 2013-01-19 NOTE — Progress Notes (Signed)
  Radiation Oncology         [phone] ________________________________  Name: [name] MRN: [id number]  Date: 01/19/2013  DOB: [birth year]  Weekly Radiation Therapy Management  Current Dose: 42 Gy     Planned Dose:  66 Gy  Narrative . . . . . . . . The patient presents for routine under treatment assessment.                                   The patient has some esophageal symptoms.                                 Set-up films were reviewed.                                 The chart was checked. Physical Findings. . .  weight is 162 lb 8 oz (73.71 kg). [name] blood pressure is 119/74 and [name] pulse is 89. . Weight essentially stable.  No significant changes. Impression . . . . . . . The patient is tolerating radiation. Plan . . . . . . . . . . . . Continue treatment as planned. Sent carafate Rx to [facility].  ________________________________  Sheral Apley. Tammi Klippel, M.D.

## 2013-01-20 ENCOUNTER — Encounter: Payer: Self-pay | Admitting: Internal Medicine

## 2013-01-20 ENCOUNTER — Ambulatory Visit: Admission: RE | Admit: 2013-01-20 | Payer: Commercial Managed Care - PPO | Source: Ambulatory Visit

## 2013-01-20 NOTE — Progress Notes (Signed)
Faxed clinical information to patient's case manager @ [facility] [place], [phone].

## 2013-01-21 ENCOUNTER — Ambulatory Visit
Admission: RE | Admit: 2013-01-21 | Discharge: 2013-01-21 | Disposition: A | Discharge status: Home / Self Care | Payer: Commercial Managed Care - PPO | Source: Ambulatory Visit | Attending: Radiation Oncology | Admitting: Radiation Oncology

## 2013-01-24 ENCOUNTER — Ambulatory Visit (HOSPITAL_BASED_OUTPATIENT_CLINIC_OR_DEPARTMENT_OTHER): Payer: Commercial Managed Care - PPO | Admitting: Physician Assistant

## 2013-01-24 ENCOUNTER — Ambulatory Visit (HOSPITAL_BASED_OUTPATIENT_CLINIC_OR_DEPARTMENT_OTHER): Payer: Commercial Managed Care - PPO

## 2013-01-24 ENCOUNTER — Ambulatory Visit
Admission: RE | Admit: 2013-01-24 | Discharge: 2013-01-24 | Disposition: A | Discharge status: Home / Self Care | Payer: Commercial Managed Care - PPO | Source: Ambulatory Visit | Attending: Radiation Oncology | Admitting: Radiation Oncology

## 2013-01-24 ENCOUNTER — Other Ambulatory Visit (HOSPITAL_BASED_OUTPATIENT_CLINIC_OR_DEPARTMENT_OTHER): Payer: Commercial Managed Care - PPO

## 2013-01-24 ENCOUNTER — Encounter: Payer: Self-pay | Admitting: Physician Assistant

## 2013-01-24 VITALS — BP 124/69 | HR 91 | Temp 97.4°F | Resp 18 | Ht 69.0 in | Wt 159.2 lb

## 2013-01-24 DIAGNOSIS — C3410 Malignant neoplasm of upper lobe, unspecified bronchus or lung: Secondary | ICD-10-CM

## 2013-01-24 DIAGNOSIS — Z5111 Encounter for antineoplastic chemotherapy: Secondary | ICD-10-CM

## 2013-01-24 DIAGNOSIS — R1319 Other dysphagia: Secondary | ICD-10-CM

## 2013-01-24 LAB — CBC WITH DIFFERENTIAL/PLATELET
BASO%: 1.4 % (ref 0.0–2.0)
BASOS ABS: 0.1 10*3/uL (ref 0.0–0.1)
EOS%: 1.4 % (ref 0.0–7.0)
Eosinophils Absolute: 0.1 10*3/uL (ref 0.0–0.5)
HCT: 35.9 % — ABNORMAL LOW (ref 38.4–49.9)
HEMOGLOBIN: 12 g/dL — AB (ref 13.0–17.1)
LYMPH#: 0.3 10*3/uL — AB (ref 0.9–3.3)
LYMPH%: 8.7 % — ABNORMAL LOW (ref 14.0–49.0)
MCH: 29.9 pg (ref 27.2–33.4)
MCHC: 33.4 g/dL (ref 32.0–36.0)
MCV: 89.3 fL (ref 79.3–98.0)
MONO#: 0.2 10*3/uL (ref 0.1–0.9)
MONO%: 4.9 % (ref 0.0–14.0)
NEUT#: 3.1 10*3/uL (ref 1.5–6.5)
NEUT%: 83.6 % — AB (ref 39.0–75.0)
Platelets: 118 10*3/uL — ABNORMAL LOW (ref 140–400)
RBC: 4.02 10*6/uL — ABNORMAL LOW (ref 4.20–5.82)
RDW: 17.4 % — ABNORMAL HIGH (ref 11.0–14.6)
WBC: 3.7 10*3/uL — ABNORMAL LOW (ref 4.0–10.3)
nRBC: 0 % (ref 0–0)

## 2013-01-24 LAB — COMPREHENSIVE METABOLIC PANEL (CC13)
ALBUMIN: 3.2 g/dL — AB (ref 3.5–5.0)
ALK PHOS: 88 U/L (ref 40–150)
ALT: 10 U/L (ref 0–55)
AST: 11 U/L (ref 5–34)
Anion Gap: 9 mEq/L (ref 3–11)
BUN: 11.1 mg/dL (ref 7.0–26.0)
CALCIUM: 9.7 mg/dL (ref 8.4–10.4)
CHLORIDE: 101 meq/L (ref 98–109)
CO2: 27 mEq/L (ref 22–29)
CREATININE: 0.7 mg/dL (ref 0.7–1.3)
GLUCOSE: 89 mg/dL (ref 70–140)
POTASSIUM: 4.5 meq/L (ref 3.5–5.1)
Sodium: 137 mEq/L (ref 136–145)
Total Bilirubin: 0.46 mg/dL (ref 0.20–1.20)
Total Protein: 7 g/dL (ref 6.4–8.3)

## 2013-01-24 MED ORDER — FAMOTIDINE IN NACL 20-0.9 MG/50ML-% IV SOLN
INTRAVENOUS | Status: AC
  Filled 2013-01-24: qty 50

## 2013-01-24 MED ORDER — SODIUM CHLORIDE 0.9 % IV SOLN
45.0000 mg/m2 | Freq: Once | INTRAVENOUS | Status: AC
  Administered 2013-01-24: 84 mg via INTRAVENOUS
  Filled 2013-01-24: qty 14

## 2013-01-24 MED ORDER — DIPHENHYDRAMINE HCL 50 MG/ML IJ SOLN
INTRAMUSCULAR | Status: AC
  Filled 2013-01-24: qty 1

## 2013-01-24 MED ORDER — DEXAMETHASONE SODIUM PHOSPHATE 20 MG/5ML IJ SOLN
20.0000 mg | Freq: Once | INTRAMUSCULAR | Status: AC
  Administered 2013-01-24: 20 mg via INTRAVENOUS

## 2013-01-24 MED ORDER — ONDANSETRON 16 MG/50ML IVPB (CHCC)
INTRAVENOUS | Status: AC
  Filled 2013-01-24: qty 16

## 2013-01-24 MED ORDER — DIPHENHYDRAMINE HCL 50 MG/ML IJ SOLN
25.0000 mg | Freq: Once | INTRAMUSCULAR | Status: AC
  Administered 2013-01-24: 12.5 mg via INTRAVENOUS

## 2013-01-24 MED ORDER — FAMOTIDINE IN NACL 20-0.9 MG/50ML-% IV SOLN
20.0000 mg | Freq: Once | INTRAVENOUS | Status: AC
  Administered 2013-01-24: 20 mg via INTRAVENOUS

## 2013-01-24 MED ORDER — ONDANSETRON 16 MG/50ML IVPB (CHCC)
16.0000 mg | Freq: Once | INTRAVENOUS | Status: AC
  Administered 2013-01-24: 16 mg via INTRAVENOUS

## 2013-01-24 MED ORDER — SODIUM CHLORIDE 0.9 % IV SOLN
Freq: Once | INTRAVENOUS | Status: AC
  Administered 2013-01-24: 12:00:00 via INTRAVENOUS

## 2013-01-24 MED ORDER — SODIUM CHLORIDE 0.9 % IV SOLN
240.8000 mg | Freq: Once | INTRAVENOUS | Status: AC
  Administered 2013-01-24: 240 mg via INTRAVENOUS
  Filled 2013-01-24: qty 24

## 2013-01-24 MED ORDER — DEXAMETHASONE SODIUM PHOSPHATE 20 MG/5ML IJ SOLN
INTRAMUSCULAR | Status: AC
  Filled 2013-01-24: qty 5

## 2013-01-24 NOTE — Patient Instructions (Signed)
[facility] Discharge Instructions for Patients Receiving Chemotherapy  Today you received the following chemotherapy agents TAXOL, CARBOPLATIN  To help prevent nausea and vomiting after your treatment, we encourage you to take your nausea medication START TAKING ABOUT 6 PM IF NEEDED   If you develop nausea and vomiting that is not controlled by your nausea medication, call the clinic.   BELOW ARE SYMPTOMS THAT SHOULD BE REPORTED IMMEDIATELY:  *FEVER GREATER THAN 100.5 F  *CHILLS WITH OR WITHOUT FEVER  NAUSEA AND VOMITING THAT IS NOT CONTROLLED WITH YOUR NAUSEA MEDICATION  *UNUSUAL SHORTNESS OF BREATH  *UNUSUAL BRUISING OR BLEEDING  TENDERNESS IN MOUTH AND THROAT WITH OR WITHOUT PRESENCE OF ULCERS  *URINARY PROBLEMS  *BOWEL PROBLEMS  UNUSUAL RASH Items with * indicate a potential emergency and should be followed up as soon as possible.  Feel free to call the clinic you have any questions or concerns. The clinic phone number is [phone].

## 2013-01-24 NOTE — Progress Notes (Addendum)
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  PRIOR THERAPY:None.  CURRENT THERAPY: Concurrent chemoradiation with weekly carboplatin for AUC of 2 and paclitaxel 45 mg/M2, status post 5 cycles.  CHEMOTHERAPY INTENT: Curative  CURRENT # OF CHEMOTHERAPY CYCLES: 6  CURRENT ANTIEMETICS: Zofran, Compazine and Decadron  CURRENT SMOKING STATUS: Current smoker. Smoke cessation consult was done.  ORAL CHEMOTHERAPY AND CONSENT: None  CURRENT BISPHOSPHONATES USE: None  PAIN MANAGEMENT: 0/10  NARCOTICS INDUCED CONSTIPATION: None  LIVING WILL AND CODE STATUS: Full code  INTERVAL HISTORY: [name] 63 y.o. male returns to the clinic today for followup visit.  He tolerated the last dose of his concurrent chemoradiation fairly well with no significant complaints except for some mild difficulty with swallowing.  The patient continues to have some restless in his legs after treatment with Benadryl for premedication of the Taxol treatment even at 25 mg. We will reduce the Benadryl further to 12.5 mg for these last 2 weeks of chemotherapy.  The patient denied having any significant chest pain, shortness of breath or hemoptysis. He denied having any fever or chills. He denied having any significant weight loss or night sweats.   MEDICAL HISTORY: Past Medical History  Diagnosis Date  . Testicular cancer 2000    s/p resection, no recurrence  . Crohn's disease     ALLERGIES:  has No Known Allergies.  MEDICATIONS:  Current Outpatient Prescriptions  Medication Sig Dispense Refill  . Astaxanthin 4 MG CAPS Take 1 tablet by mouth daily. With 325 mg of ala      . Cholecalciferol (VITAMIN  D-3 PO) Take 8,000 Units by mouth daily.      Marland Kitchen KRILL OIL PO Take 2 capsules by mouth daily.      . Menaquinone-7 (VITAMIN K2 PO) Take 150 mcg by mouth daily.      . Probiotic Product (PROBIOTIC DAILY PO) Take 1 capsule by mouth daily.      Marland Kitchen RESVERATROL PO Take by mouth daily.      . sucralfate (CARAFATE) 1 G tablet Take 1 tablet (1 g total) by mouth 4 (four) times daily -  with meals and at bedtime. 5 min before eating  120 tablet  1  . TURMERIC CURCUMIN PO Take by mouth daily.      . vitamin E 200 UNIT capsule Take by mouth daily.      . budesonide-formoterol (SYMBICORT) 160-4.5 MCG/ACT inhaler Inhale 2 puffs into the lungs 2 (two) times daily.  1 Inhaler  12  . prochlorperazine (COMPAZINE) 10 MG tablet Take 1 tablet (10 mg total) by mouth every 6 (six) hours as needed for nausea or vomiting.  60 tablet  0   No current facility-administered medications for this visit.    SURGICAL HISTORY:  Past Surgical History  Procedure Laterality Date  . Testicular removal  2000  . Appendectomy    . Hernia repair    . Video bronchoscopy N/A 11/30/2012    Procedure: VIDEO BRONCHOSCOPY WITH FLUORO;  Surgeon: Elsie Stain, MD;  Location: [facility];  Service: Cardiopulmonary;  Laterality: N/A;    REVIEW OF SYSTEMS:  A comprehensive review of systems was negative except for: Behavioral/Psych: positive for sleep disturbance  PHYSICAL EXAMINATION: General appearance: alert, cooperative, fatigued and no distress Head: Normocephalic, without obvious abnormality, atraumatic Neck: no adenopathy, no JVD, supple, symmetrical, trachea midline and thyroid not enlarged, symmetric, no tenderness/mass/nodules Lymph nodes: Cervical, supraclavicular, and axillary nodes normal. Resp: clear to auscultation bilaterally Back: symmetric, no curvature. ROM normal. No CVA tenderness. Cardio: regular rate and rhythm, S1, S2 normal, no murmur, click, rub or gallop GI: soft, non-tender; bowel sounds normal; no  masses,  no organomegaly Extremities: extremities normal, atraumatic, no cyanosis or edema  ECOG PERFORMANCE STATUS: 1 - Symptomatic but completely ambulatory  Blood pressure 124/69, pulse 91, temperature 97.4 F (36.3 C), temperature source Oral, resp. rate 18, height 5' 9"  (1.753 m), weight 159 lb 3.2 oz (72.213 kg).  LABORATORY DATA: Lab Results  Component Value Date   WBC 3.7* 01/24/2013   HGB 12.0* 01/24/2013   HCT 35.9* 01/24/2013   MCV 89.3 01/24/2013   PLT 118* 01/24/2013      Chemistry      Component Value Date/Time   NA 136 01/17/2013 0855   NA 136 11/26/2012 1140   K 4.5 01/17/2013 0855   K 4.2 11/26/2012 1140   CL 102 11/26/2012 1140   CO2 24 01/17/2013 0855   CO2 29 11/26/2012 1140   BUN 8.1 01/17/2013 0855   BUN 9 11/26/2012 1140   CREATININE 0.8 01/17/2013 0855   CREATININE 0.8 11/26/2012 1140      Component Value Date/Time   CALCIUM 9.7 01/17/2013 0855   CALCIUM 9.1 11/26/2012 1140   ALKPHOS 96 01/17/2013 0855   ALKPHOS 96 07/02/2009 1157   AST 8 01/17/2013 0855   AST 14 07/02/2009 1157   ALT 10 01/17/2013 0855   ALT 11 07/02/2009 1157   BILITOT 0.77 01/17/2013 0855   BILITOT 0.6 07/02/2009 1157       RADIOGRAPHIC STUDIES: No results found.   ASSESSMENT AND PLAN: This is a very pleasant 63 years old white male recently diagnosed with a stage IIIA non-small cell lung cancer currently undergoing concurrent chemoradiation with weekly carboplatin and paclitaxel status post 5 cycles. The patient is tolerating his treatment fairly well with no significant adverse effects except for some mild difficulty swallowing. He is utilizing the Carafate that was prescribed by radiation oncology. Patient was discussed with him also seen by Dr. Julien Nordmann. He will complete his course of concurrent chemoradiation as scheduled. He'll followup with Dr. Julien Nordmann in approximately 6 weeks with a restaging CT scan of the chest with contrast to reevaluate his disease.  He was advised to call  immediately if he has any concerning symptoms in the interval.  The patient voices understanding of current disease status and treatment options and is in agreement with the current care plan.  All questions were answered. The patient knows to call the clinic with any problems, questions or concerns. We can certainly see the patient much sooner if necessary.    Carlton Adam, PA_C   ADDENDUM: Hematology/Oncology Attending: I had the face to face encounter with the patient. I recommended his care plan.this is a very pleasant 63 years old white male with stage IIIA non-small cell lung cancer currently undergoing concurrent chemoradiation with weekly carboplatin and paclitaxel status post 5 cycles. The patient is tolerating his treatment fairly well except for restless leg after the Benadryl.  I would reduce his dose of Benadryl to 12.5 mg starting from cycle #6. He would also continue on Carafate and pain medication for the odynophagia. The patient would come back  for follow up visit in 6 weeks for reevaluation and repeat CT scan of the chest for restaging of his disease. He was advised to call immediately if he has any concerning symptoms in the interval.  Disclaimer: This note was dictated with voice recognition software. Similar sounding words can inadvertently be transcribed and may not be corrected upon review.  Eilleen Kempf., MD 01/25/2013

## 2013-01-24 NOTE — Patient Instructions (Signed)
Continue your course of concurrent chemoradiation as scheduled Follow with Dr. Julien Nordmann in approximately 6 weeks with restaging CT scan of your chest to reevaluate your disease

## 2013-01-25 ENCOUNTER — Telehealth: Payer: Self-pay | Admitting: Internal Medicine

## 2013-01-25 ENCOUNTER — Ambulatory Visit
Admission: RE | Admit: 2013-01-25 | Discharge: 2013-01-25 | Disposition: A | Discharge status: Home / Self Care | Payer: Commercial Managed Care - PPO | Source: Ambulatory Visit | Attending: Radiation Oncology | Admitting: Radiation Oncology

## 2013-01-25 NOTE — Telephone Encounter (Signed)
lvm for pt regardign to Jan thru March appts...amiled pt appt sched for Jan thru March, avs and letter

## 2013-01-26 ENCOUNTER — Ambulatory Visit
Admission: RE | Admit: 2013-01-26 | Discharge: 2013-01-26 | Disposition: A | Discharge status: Home / Self Care | Payer: Commercial Managed Care - PPO | Source: Ambulatory Visit | Attending: Radiation Oncology | Admitting: Radiation Oncology

## 2013-01-27 ENCOUNTER — Ambulatory Visit
Admission: RE | Admit: 2013-01-27 | Discharge: 2013-01-27 | Disposition: A | Discharge status: Home / Self Care | Payer: Commercial Managed Care - PPO | Source: Ambulatory Visit | Attending: Radiation Oncology | Admitting: Radiation Oncology

## 2013-01-28 ENCOUNTER — Ambulatory Visit
Admission: RE | Admit: 2013-01-28 | Discharge: 2013-01-28 | Disposition: A | Discharge status: Home / Self Care | Payer: Commercial Managed Care - PPO | Source: Ambulatory Visit | Attending: Radiation Oncology | Admitting: Radiation Oncology

## 2013-01-28 ENCOUNTER — Encounter: Payer: Self-pay | Admitting: Radiation Oncology

## 2013-01-28 VITALS — BP 125/78 | HR 100 | Wt 159.7 lb

## 2013-01-28 DIAGNOSIS — C3410 Malignant neoplasm of upper lobe, unspecified bronchus or lung: Secondary | ICD-10-CM

## 2013-01-28 NOTE — Progress Notes (Signed)
  Radiation Oncology         [phone] ________________________________  Name: [name] MRN: [id number]  Date: 01/28/2013  DOB: 09-20-1950  Weekly Radiation Therapy Management  Current Dose: 54 Gy     Planned Dose:  66 Gy  Narrative . . . . . . . . The patient presents for routine under treatment assessment.                                   The patient is without complaint.                                 Set-up films were reviewed.                                 The chart was checked. Physical Findings. . .  weight is 159 lb 11.2 oz (72.439 kg). His blood pressure is 125/78 and his pulse is 100. His oxygen saturation is 100%. . Weight essentially stable.  No significant changes. Impression . . . . . . . The patient is tolerating radiation. Plan . . . . . . . . . . . . Continue treatment as planned.  ________________________________  Sheral Apley. Tammi Klippel, M.D.

## 2013-01-28 NOTE — Progress Notes (Signed)
Reports cough with clear sputum increasing in frequency. Denies shortness of breath. Reports difficulty swallowing continues despite Carafate. Denies fatigue. Mild hyperpigmentation of left upper back noted. Reports using radiaplex to chest and back bid as directed. No complaints at this time.

## 2013-01-31 ENCOUNTER — Other Ambulatory Visit (HOSPITAL_BASED_OUTPATIENT_CLINIC_OR_DEPARTMENT_OTHER): Payer: Commercial Managed Care - PPO

## 2013-01-31 ENCOUNTER — Ambulatory Visit
Admission: RE | Admit: 2013-01-31 | Discharge: 2013-01-31 | Disposition: A | Discharge status: Home / Self Care | Payer: Commercial Managed Care - PPO | Source: Ambulatory Visit | Attending: Radiation Oncology | Admitting: Radiation Oncology

## 2013-01-31 ENCOUNTER — Ambulatory Visit (HOSPITAL_BASED_OUTPATIENT_CLINIC_OR_DEPARTMENT_OTHER): Payer: Commercial Managed Care - PPO

## 2013-01-31 VITALS — BP 120/70 | HR 89 | Temp 98.0°F | Resp 18

## 2013-01-31 DIAGNOSIS — Z5111 Encounter for antineoplastic chemotherapy: Secondary | ICD-10-CM

## 2013-01-31 DIAGNOSIS — C3410 Malignant neoplasm of upper lobe, unspecified bronchus or lung: Secondary | ICD-10-CM

## 2013-01-31 LAB — COMPREHENSIVE METABOLIC PANEL (CC13)
ALBUMIN: 3.3 g/dL — AB (ref 3.5–5.0)
ALK PHOS: 89 U/L (ref 40–150)
ALT: 10 U/L (ref 0–55)
AST: 9 U/L (ref 5–34)
Anion Gap: 10 mEq/L (ref 3–11)
BUN: 8.4 mg/dL (ref 7.0–26.0)
CO2: 27 mEq/L (ref 22–29)
Calcium: 9.9 mg/dL (ref 8.4–10.4)
Chloride: 100 mEq/L (ref 98–109)
Creatinine: 0.8 mg/dL (ref 0.7–1.3)
Glucose: 83 mg/dl (ref 70–140)
POTASSIUM: 4.5 meq/L (ref 3.5–5.1)
SODIUM: 137 meq/L (ref 136–145)
TOTAL PROTEIN: 7.3 g/dL (ref 6.4–8.3)
Total Bilirubin: 0.43 mg/dL (ref 0.20–1.20)

## 2013-01-31 LAB — CBC WITH DIFFERENTIAL/PLATELET
BASO%: 0 % (ref 0.0–2.0)
BASOS ABS: 0 10*3/uL (ref 0.0–0.1)
EOS%: 1.1 % (ref 0.0–7.0)
Eosinophils Absolute: 0 10*3/uL (ref 0.0–0.5)
HCT: 34.9 % — ABNORMAL LOW (ref 38.4–49.9)
HEMOGLOBIN: 11.6 g/dL — AB (ref 13.0–17.1)
LYMPH%: 11.8 % — ABNORMAL LOW (ref 14.0–49.0)
MCH: 29.9 pg (ref 27.2–33.4)
MCHC: 33.2 g/dL (ref 32.0–36.0)
MCV: 89.9 fL (ref 79.3–98.0)
MONO#: 0.3 10*3/uL (ref 0.1–0.9)
MONO%: 10.3 % (ref 0.0–14.0)
NEUT#: 2.1 10*3/uL (ref 1.5–6.5)
NEUT%: 76.8 % — ABNORMAL HIGH (ref 39.0–75.0)
Platelets: 234 10*3/uL (ref 140–400)
RBC: 3.88 10*6/uL — ABNORMAL LOW (ref 4.20–5.82)
RDW: 18.6 % — AB (ref 11.0–14.6)
WBC: 2.7 10*3/uL — ABNORMAL LOW (ref 4.0–10.3)
lymph#: 0.3 10*3/uL — ABNORMAL LOW (ref 0.9–3.3)
nRBC: 0 % (ref 0–0)

## 2013-01-31 MED ORDER — SODIUM CHLORIDE 0.9 % IV SOLN
240.8000 mg | Freq: Once | INTRAVENOUS | Status: AC
  Administered 2013-01-31: 240 mg via INTRAVENOUS
  Filled 2013-01-31: qty 24

## 2013-01-31 MED ORDER — SODIUM CHLORIDE 0.9 % IV SOLN
Freq: Once | INTRAVENOUS | Status: AC
  Administered 2013-01-31: 10:00:00 via INTRAVENOUS

## 2013-01-31 MED ORDER — DIPHENHYDRAMINE HCL 50 MG/ML IJ SOLN
INTRAMUSCULAR | Status: AC
  Filled 2013-01-31: qty 1

## 2013-01-31 MED ORDER — DEXAMETHASONE SODIUM PHOSPHATE 20 MG/5ML IJ SOLN
20.0000 mg | Freq: Once | INTRAMUSCULAR | Status: AC
  Administered 2013-01-31: 20 mg via INTRAVENOUS

## 2013-01-31 MED ORDER — DEXAMETHASONE SODIUM PHOSPHATE 20 MG/5ML IJ SOLN
INTRAMUSCULAR | Status: AC
  Filled 2013-01-31: qty 5

## 2013-01-31 MED ORDER — ONDANSETRON 16 MG/50ML IVPB (CHCC)
16.0000 mg | Freq: Once | INTRAVENOUS | Status: AC
  Administered 2013-01-31: 16 mg via INTRAVENOUS

## 2013-01-31 MED ORDER — ONDANSETRON 16 MG/50ML IVPB (CHCC)
INTRAVENOUS | Status: AC
  Filled 2013-01-31: qty 16

## 2013-01-31 MED ORDER — FAMOTIDINE IN NACL 20-0.9 MG/50ML-% IV SOLN
20.0000 mg | Freq: Once | INTRAVENOUS | Status: AC
  Administered 2013-01-31: 20 mg via INTRAVENOUS

## 2013-01-31 MED ORDER — DIPHENHYDRAMINE HCL 50 MG/ML IJ SOLN
12.5000 mg | Freq: Once | INTRAMUSCULAR | Status: AC
  Administered 2013-01-31: 12.5 mg via INTRAVENOUS

## 2013-01-31 MED ORDER — FAMOTIDINE IN NACL 20-0.9 MG/50ML-% IV SOLN
INTRAVENOUS | Status: AC
  Filled 2013-01-31: qty 50

## 2013-01-31 MED ORDER — SODIUM CHLORIDE 0.9 % IV SOLN
45.0000 mg/m2 | Freq: Once | INTRAVENOUS | Status: AC
  Administered 2013-01-31: 84 mg via INTRAVENOUS
  Filled 2013-01-31: qty 14

## 2013-01-31 NOTE — Progress Notes (Signed)
Discharged at 22 with wife to home.  No distress.  Cough started within the past thirty minutes that was continuous and nonproductive.  Reports he has hycodan syrup at home he doesn't like to use due to drowsiness.

## 2013-01-31 NOTE — Patient Instructions (Signed)
[name] Discharge Instructions for Patients Receiving Chemotherapy  Today you received the following chemotherapy agents Taxol, Carboplatin.  To help prevent nausea and vomiting after your treatment, we encourage you to take your nausea medication Other Compazine 10 mg every 6 hours OR 30 minutes before meals and at bedtime as needed if experiencing nausea or vomiting.   If you develop nausea and vomiting that is not controlled by your nausea medication, call the clinic.   BELOW ARE SYMPTOMS THAT SHOULD BE REPORTED IMMEDIATELY:  *FEVER GREATER THAN 100.5 F  *CHILLS WITH OR WITHOUT FEVER  NAUSEA AND VOMITING THAT IS NOT CONTROLLED WITH YOUR NAUSEA MEDICATION  *UNUSUAL SHORTNESS OF BREATH  *UNUSUAL BRUISING OR BLEEDING  TENDERNESS IN MOUTH AND THROAT WITH OR WITHOUT PRESENCE OF ULCERS  *URINARY PROBLEMS  *BOWEL PROBLEMS  UNUSUAL RASH Items with * indicate a potential emergency and should be followed up as soon as possible.  Feel free to call the clinic you have any questions or concerns. The clinic phone number is [phone].

## 2013-02-01 ENCOUNTER — Ambulatory Visit
Admission: RE | Admit: 2013-02-01 | Discharge: 2013-02-01 | Disposition: A | Discharge status: Home / Self Care | Payer: Commercial Managed Care - PPO | Source: Ambulatory Visit | Attending: Radiation Oncology | Admitting: Radiation Oncology

## 2013-02-02 ENCOUNTER — Encounter: Payer: Self-pay | Admitting: Critical Care Medicine

## 2013-02-02 ENCOUNTER — Ambulatory Visit
Admission: RE | Admit: 2013-02-02 | Discharge: 2013-02-02 | Disposition: A | Discharge status: Home / Self Care | Payer: Commercial Managed Care - PPO | Source: Ambulatory Visit | Attending: Radiation Oncology | Admitting: Radiation Oncology

## 2013-02-02 ENCOUNTER — Ambulatory Visit: Payer: Commercial Managed Care - PPO | Admitting: Critical Care Medicine

## 2013-02-02 ENCOUNTER — Ambulatory Visit (INDEPENDENT_AMBULATORY_CARE_PROVIDER_SITE_OTHER): Payer: Commercial Managed Care - PPO | Admitting: Critical Care Medicine

## 2013-02-02 VITALS — BP 122/80 | HR 86 | Temp 97.5°F | Ht 69.0 in | Wt 163.2 lb

## 2013-02-02 DIAGNOSIS — N289 Disorder of kidney and ureter, unspecified: Secondary | ICD-10-CM

## 2013-02-02 DIAGNOSIS — F17200 Nicotine dependence, unspecified, uncomplicated: Secondary | ICD-10-CM

## 2013-02-02 DIAGNOSIS — C3410 Malignant neoplasm of upper lobe, unspecified bronchus or lung: Secondary | ICD-10-CM

## 2013-02-02 DIAGNOSIS — N2889 Other specified disorders of kidney and ureter: Secondary | ICD-10-CM

## 2013-02-02 DIAGNOSIS — J449 Chronic obstructive pulmonary disease, unspecified: Secondary | ICD-10-CM

## 2013-02-02 MED ORDER — AZITHROMYCIN 250 MG PO TABS: ORAL_TABLET | ORAL | Status: DC

## 2013-02-02 MED ORDER — VARENICLINE TARTRATE 1 MG PO TABS: 1.0000 mg | ORAL_TABLET | Freq: Two times a day (BID) | ORAL | Status: DC

## 2013-02-02 MED ORDER — VARENICLINE TARTRATE 0.5 MG X 11 & 1 MG X 42 PO MISC: ORAL | Status: DC

## 2013-02-02 NOTE — Progress Notes (Signed)
Subjective:    Patient ID: [name], male    DOB: 02-May-1950, 63 y.o.   MRN: [id number]  HPI   63 y.o.WM  02/02/2013 Chief Complaint  Patient presents with  . 2 month follow up    Coughing over the past 3-4 days with clear mucus.  Occas wheezing.  Has trouble swallowing  over the past few wks - begin after starting radiation.  No SOB.  Now more cough in past few days.  Clear mucus.  No real chest pain.  XRT caused dysphagia.  Not much dyspnea with exertion. Last XRT is coming up. Trying to quit smoking. Uses Vapes ecigs.  Pt will smoke cigs as well.   Past Medical History  Diagnosis Date  . Testicular cancer 2000    s/p resection, no recurrence  . Crohn's disease      Family History  Problem Relation Age of Onset  . Asthma Brother   . Testicular cancer Brother   . Cancer Brother   . Breast cancer Sister      History   Social History  . Marital Status: Married    Spouse Name: N/A    Number of Children: N/A  . Years of Education: N/A   Occupational History  . Consultant    Social History Main Topics  . Smoking status: Current Every Day Smoker -- 0.75 packs/day    Types: Cigarettes    Start date: 01/06/1966  . Smokeless tobacco: Never Used     Comment: Smoking less than 1 ppd right now  . Alcohol Use: Yes     Comment: 2-3 beers per day  . Drug Use: No  . Sexual Activity: Not on file   Other Topics Concern  . Not on file   Social History Narrative  . No narrative on file     No Known Allergies   Outpatient Prescriptions Prior to Visit  Medication Sig Dispense Refill  . Astaxanthin 4 MG CAPS Take 1 tablet by mouth daily. With 325 mg of ala      . Cholecalciferol (VITAMIN D-3 PO) Take 8,000 Units by mouth daily.      Marland Kitchen KRILL OIL PO Take 2 capsules by mouth daily.      . Menaquinone-7 (VITAMIN K2 PO) Take 150 mcg by mouth daily.      . Probiotic Product (PROBIOTIC DAILY PO) Take 1 capsule by mouth daily.      . prochlorperazine (COMPAZINE) 10 MG  tablet Take 1 tablet (10 mg total) by mouth every 6 (six) hours as needed for nausea or vomiting.  60 tablet  0  . RESVERATROL PO Take by mouth daily.      . TURMERIC CURCUMIN PO Take by mouth daily.      . vitamin E 200 UNIT capsule Take by mouth daily.      . budesonide-formoterol (SYMBICORT) 160-4.5 MCG/ACT inhaler Inhale 2 puffs into the lungs 2 (two) times daily.  1 Inhaler  12  . sucralfate (CARAFATE) 1 G tablet Take 1 tablet (1 g total) by mouth 4 (four) times daily -  with meals and at bedtime. 5 min before eating  120 tablet  1   No facility-administered medications prior to visit.     Review of Systems  Constitutional: Positive for activity change. Negative for fever, chills, diaphoresis, appetite change, fatigue and unexpected weight change.  HENT: Positive for congestion, rhinorrhea and sneezing. Negative for dental problem, ear discharge, ear pain, facial swelling, hearing loss, mouth sores, nosebleeds,  postnasal drip, sinus pressure, sore throat, tinnitus, trouble swallowing and voice change.   Eyes: Negative for photophobia, discharge, itching and visual disturbance.  Respiratory: Positive for cough, shortness of breath and wheezing. Negative for apnea, choking, chest tightness and stridor.   Cardiovascular: Negative for chest pain, palpitations and leg swelling.  Gastrointestinal: Negative for nausea, vomiting, abdominal pain, constipation, blood in stool and abdominal distention.  Genitourinary: Positive for flank pain. Negative for dysuria, urgency, frequency, hematuria, decreased urine volume and difficulty urinating.  Musculoskeletal: Positive for arthralgias, back pain, neck pain and neck stiffness. Negative for gait problem, joint swelling and myalgias.  Skin: Negative for color change, pallor and rash.  Neurological: Negative for dizziness, tremors, seizures, syncope, speech difficulty, weakness, light-headedness, numbness and headaches.  Hematological: Negative for  adenopathy. Does not bruise/bleed easily.  Psychiatric/Behavioral: Positive for sleep disturbance. Negative for confusion and agitation. The patient is not nervous/anxious.        Objective:   Physical Exam  Filed Vitals:   02/02/13 0926  BP: 122/80  Pulse: 86  Temp: 97.5 F (36.4 C)  TempSrc: Oral  Height: 5' 9"  (1.753 m)  Weight: 163 lb 3.2 oz (74.027 kg)  SpO2: 99%    Gen: Pleasant, well-nourished, in no distress,  normal affect  ENT: No lesions,  mouth clear,  oropharynx clear, no postnasal drip  Neck: No JVD, no TMG, no carotid bruits  Lungs: No use of accessory muscles, no dullness to percussion, distant breath  Cardiovascular: RRR, heart sounds normal, no murmur or gallops, no peripheral edema  Abdomen: soft and NT, no HSM,  BS normal  Musculoskeletal: No deformities, no cyanosis or clubbing  Neuro: alert, non focal  Skin: Warm, no lesions or rashes  No results found.   All imaging studies reviewed Moderate restriction  and obstruction on pfts     Assessment & Plan:   COPD with chronic bronchitis stage B. Gold stage B. 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 Astaxanthin 4 MG CAPS Take 1 tablet by mouth daily. With 325 mg of ala  . Cholecalciferol (VITAMIN D-3 PO) Take 8,000 Units by mouth daily.  Marland Kitchen KRILL OIL PO Take 2 capsules by mouth daily.  . Menaquinone-7 (VITAMIN K2 PO) Take 150 mcg by mouth daily.  Marland Kitchen oxycodone (OXY-IR) 5 MG capsule Take 5 mg by mouth as needed.  . Probiotic Product (PROBIOTIC DAILY PO) Take 1 capsule by mouth daily.  . prochlorperazine (COMPAZINE) 10 MG tablet Take 1 tablet (10 mg total) by mouth every 6 (six) hours as needed for nausea or vomiting.  Marland Kitchen RESVERATROL PO Take by mouth daily.  . TURMERIC CURCUMIN PO Take by mouth daily.  . vitamin E  200 UNIT capsule Take by mouth daily.  Marland Kitchen azithromycin (ZITHROMAX Z-PAK) 250 MG tablet Take 2 tablets once then one daily until finished  . budesonide-formoterol (SYMBICORT) 160-4.5 MCG/ACT inhaler Inhale 2 puffs into the lungs 2 (two) times daily.  . sucralfate (CARAFATE) 1 G tablet Take 1 tablet (1 g total) by mouth 4 (four) times daily -  with meals and at bedtime. 5 min before eating  . varenicline (CHANTIX CONTINUING MONTH PAK) 1 MG tablet Take 1 tablet (1 mg total) by mouth 2 (two) times daily.  . varenicline (CHANTIX STARTING MONTH PAK) 0.5 MG X 11 & 1 MG X 42 tablet Take one 0.5 mg tablet by mouth once daily for 3 days, then increase to one 0.5 mg tablet twice daily for 4 days, then increase to one 1 mg tablet twice daily.

## 2013-02-02 NOTE — Patient Instructions (Signed)
We sent a zpak to [facility] Referral to [facility] will be made for smoking cessation.   We will give you a Chantix Starter Pack and a continuing refill to help with smoking. Follow up with Dr. Joya Gaskins in 3 months

## 2013-02-03 ENCOUNTER — Ambulatory Visit
Admission: RE | Admit: 2013-02-03 | Discharge: 2013-02-03 | Disposition: A | Discharge status: Home / Self Care | Payer: Commercial Managed Care - PPO | Source: Ambulatory Visit | Attending: Radiation Oncology | Admitting: Radiation Oncology

## 2013-02-03 NOTE — Assessment & Plan Note (Signed)
Ongoing tobacco use Smoking cessation advised with counseling given Chantix prescription issued

## 2013-02-03 NOTE — Assessment & Plan Note (Signed)
Left renal mass with probable malignancy Plan Per urology

## 2013-02-03 NOTE — Assessment & Plan Note (Signed)
Gold stage B. COPD The patient relates no improvement with Symbicort There is a mild tracheobronchitis Patient has ongoing smoking use Plan Discontinue Symbicort Administer azithromycin for 5 days Greater than 10 minutes of smoking cessation counseling was given to the patient and a prescription for Chantix was issued

## 2013-02-03 NOTE — Assessment & Plan Note (Signed)
Stage IIIa left or below squamous cell carcinoma of the lung Radiation treatment and chemotherapy his primary treatment for this at this time No immediate consideration for lobectomy is being considered Plan Per radiation and medical oncology Again smoking cessation has been advised

## 2013-02-04 ENCOUNTER — Ambulatory Visit
Admission: RE | Admit: 2013-02-04 | Discharge: 2013-02-04 | Disposition: A | Discharge status: Home / Self Care | Payer: Commercial Managed Care - PPO | Source: Ambulatory Visit | Attending: Radiation Oncology | Admitting: Radiation Oncology

## 2013-02-04 ENCOUNTER — Encounter: Payer: Self-pay | Admitting: Radiation Oncology

## 2013-02-04 ENCOUNTER — Other Ambulatory Visit: Payer: Self-pay | Admitting: *Deleted

## 2013-02-04 ENCOUNTER — Ambulatory Visit: Payer: Commercial Managed Care - PPO

## 2013-02-04 VITALS — BP 102/62 | HR 96 | Temp 98.6°F | Resp 16 | Wt 159.3 lb

## 2013-02-04 DIAGNOSIS — C3410 Malignant neoplasm of upper lobe, unspecified bronchus or lung: Secondary | ICD-10-CM

## 2013-02-04 NOTE — Progress Notes (Signed)
  Radiation Oncology         [phone] ________________________________  Name: [name] MRN: [id number]  Date: 02/04/2013  DOB: 03-30-1950  Weekly Radiation Therapy Management  Current Dose: 64 Gy     Planned Dose:  66 Gy  Narrative . . . . . . . . The patient presents for routine under treatment assessment. Reports that his muscle are sore and achy all over. Reports that his cough is worse. Reports he saw Dr. Joya Gaskins and was given a course of antibiotics. Reports manageable discomfort swallowing. Hyperpigmentation of upper back related to effects of radiation therapy noted. Patient reports using radiaplex bid. CT of chest scheduled for March 6th and follow up with [facility] for March 9th. One month follow up appointment card given.                                   The patient is without complaint.                                 Set-up films were reviewed.                                 The chart was checked. Physical Findings. . .  weight is 159 lb 4.8 oz (72.258 kg). His oral temperature is 98.6 F (37 C). His blood pressure is 102/62 and his pulse is 96. His respiration is 16 and oxygen saturation is 96%. . Weight essentially stable.  No significant changes. Impression . . . . . . . The patient is tolerating radiation. Plan . . . . . . . . . . . . Continue treatment as planned.  ________________________________  Sheral Apley. Tammi Klippel, M.D.

## 2013-02-04 NOTE — Progress Notes (Signed)
Reports that his muscle are sore and achy all over. Reports that his cough is worse. Reports he saw Dr. Joya Gaskins and was given a course of antibiotics. Reports manageable discomfort swallowing. Hyperpigmentation of upper back related to effects of radiation therapy noted. Patient reports using radiaplex bid. CT of chest scheduled for March 6th and follow up with [facility] for March 9th. One month follow up appointment card given.

## 2013-02-07 ENCOUNTER — Ambulatory Visit
Admission: RE | Admit: 2013-02-07 | Discharge: 2013-02-07 | Disposition: A | Discharge status: Home / Self Care | Payer: Commercial Managed Care - PPO | Source: Ambulatory Visit | Attending: Radiation Oncology | Admitting: Radiation Oncology

## 2013-02-07 ENCOUNTER — Encounter: Payer: Self-pay | Admitting: Radiation Oncology

## 2013-02-07 ENCOUNTER — Other Ambulatory Visit (HOSPITAL_BASED_OUTPATIENT_CLINIC_OR_DEPARTMENT_OTHER): Payer: Commercial Managed Care - PPO

## 2013-02-07 DIAGNOSIS — C3410 Malignant neoplasm of upper lobe, unspecified bronchus or lung: Secondary | ICD-10-CM

## 2013-02-07 LAB — CBC WITH DIFFERENTIAL/PLATELET
BASO%: 0.6 % (ref 0.0–2.0)
Basophils Absolute: 0 10*3/uL (ref 0.0–0.1)
EOS%: 0.3 % (ref 0.0–7.0)
Eosinophils Absolute: 0 10*3/uL (ref 0.0–0.5)
HCT: 32.9 % — ABNORMAL LOW (ref 38.4–49.9)
HGB: 11.2 g/dL — ABNORMAL LOW (ref 13.0–17.1)
LYMPH%: 9 % — AB (ref 14.0–49.0)
MCH: 31.3 pg (ref 27.2–33.4)
MCHC: 33.9 g/dL (ref 32.0–36.0)
MCV: 92.4 fL (ref 79.3–98.0)
MONO#: 0.6 10*3/uL (ref 0.1–0.9)
MONO%: 12.7 % (ref 0.0–14.0)
NEUT#: 3.5 10*3/uL (ref 1.5–6.5)
NEUT%: 77.4 % — AB (ref 39.0–75.0)
PLATELETS: 464 10*3/uL — AB (ref 140–400)
RBC: 3.56 10*6/uL — ABNORMAL LOW (ref 4.20–5.82)
RDW: 21.3 % — ABNORMAL HIGH (ref 11.0–14.6)
WBC: 4.6 10*3/uL (ref 4.0–10.3)
lymph#: 0.4 10*3/uL — ABNORMAL LOW (ref 0.9–3.3)

## 2013-02-07 LAB — COMPREHENSIVE METABOLIC PANEL (CC13)
ALT: 8 U/L (ref 0–55)
ANION GAP: 9 meq/L (ref 3–11)
AST: 8 U/L (ref 5–34)
Albumin: 3.1 g/dL — ABNORMAL LOW (ref 3.5–5.0)
Alkaline Phosphatase: 90 U/L (ref 40–150)
BUN: 8.6 mg/dL (ref 7.0–26.0)
CO2: 26 mEq/L (ref 22–29)
CREATININE: 0.8 mg/dL (ref 0.7–1.3)
Calcium: 9.8 mg/dL (ref 8.4–10.4)
Chloride: 102 mEq/L (ref 98–109)
Glucose: 81 mg/dl (ref 70–140)
Potassium: 4.5 mEq/L (ref 3.5–5.1)
Sodium: 138 mEq/L (ref 136–145)
Total Protein: 7 g/dL (ref 6.4–8.3)

## 2013-02-09 NOTE — Progress Notes (Signed)
  Radiation Oncology         [phone]) [phone] ________________________________  Name: [name] MRN: [id number]  Date: 02/07/2013  DOB: 07/10/1950  End of Treatment Note  Diagnosis:   63 yo man with stage T2a N2 M0 squamous cell carcinoma of the left lung and a suspicious kidney mass  Indication for treatment:  Curative       Radiation treatment dates:   12/20/2013-02/07/2013  Site/dose:   The patient's primary left lung cancer and grossly evident mediastinal lymphadenopathy were treated to 66 gray in 33 fractions of 2 gray  Beams/energy:   The patient was treated with 3-D conformal radiotherapy using Tomo 3D with daily megavoltage CT image guided radiotherapy. 6 megavolt photons were delivered in helical fashion conformally to the tumor volume without intensity modulation.  Narrative: The patient tolerated radiation treatment relatively well.   He experienced some cutaneous hyperpigmentation of the back as well as some mild esophageal discomfort. The patient also suffered with a bout of bronchitis requiring antibiotics during radiotherapy.  Plan: The patient has completed radiation treatment. The patient will return to radiation oncology clinic for routine followup in one month. I advised them to call or return sooner if they have any questions or concerns related to their recovery or treatment. ________________________________  Sheral Apley. Tammi Klippel, M.D.

## 2013-03-01 ENCOUNTER — Encounter: Payer: Self-pay | Admitting: Internal Medicine

## 2013-03-01 NOTE — Progress Notes (Signed)
Faxed disability form to Marcine Matar @ [phone]

## 2013-03-11 ENCOUNTER — Other Ambulatory Visit (HOSPITAL_BASED_OUTPATIENT_CLINIC_OR_DEPARTMENT_OTHER): Payer: Commercial Managed Care - PPO

## 2013-03-11 ENCOUNTER — Encounter (HOSPITAL_COMMUNITY): Payer: Self-pay

## 2013-03-11 ENCOUNTER — Ambulatory Visit (HOSPITAL_COMMUNITY)
Admission: RE | Admit: 2013-03-11 | Discharge: 2013-03-11 | Disposition: A | Discharge status: Home / Self Care | Payer: Commercial Managed Care - PPO | Source: Ambulatory Visit | Attending: Physician Assistant | Admitting: Physician Assistant

## 2013-03-11 DIAGNOSIS — C3490 Malignant neoplasm of unspecified part of unspecified bronchus or lung: Secondary | ICD-10-CM | POA: Insufficient documentation

## 2013-03-11 DIAGNOSIS — C3410 Malignant neoplasm of upper lobe, unspecified bronchus or lung: Secondary | ICD-10-CM

## 2013-03-11 LAB — CBC WITH DIFFERENTIAL/PLATELET
BASO%: 0.3 % (ref 0.0–2.0)
BASOS ABS: 0 10*3/uL (ref 0.0–0.1)
EOS ABS: 0.1 10*3/uL (ref 0.0–0.5)
EOS%: 2.2 % (ref 0.0–7.0)
HEMATOCRIT: 34.6 % — AB (ref 38.4–49.9)
HEMOGLOBIN: 11.5 g/dL — AB (ref 13.0–17.1)
LYMPH%: 9.9 % — ABNORMAL LOW (ref 14.0–49.0)
MCH: 31.3 pg (ref 27.2–33.4)
MCHC: 33.2 g/dL (ref 32.0–36.0)
MCV: 94 fL (ref 79.3–98.0)
MONO#: 0.5 10*3/uL (ref 0.1–0.9)
MONO%: 7.2 % (ref 0.0–14.0)
NEUT%: 80.4 % — AB (ref 39.0–75.0)
NEUTROS ABS: 5.3 10*3/uL (ref 1.5–6.5)
Platelets: 219 10*3/uL (ref 140–400)
RBC: 3.68 10*6/uL — ABNORMAL LOW (ref 4.20–5.82)
RDW: 19.5 % — AB (ref 11.0–14.6)
WBC: 6.6 10*3/uL (ref 4.0–10.3)
lymph#: 0.7 10*3/uL — ABNORMAL LOW (ref 0.9–3.3)

## 2013-03-11 LAB — COMPREHENSIVE METABOLIC PANEL (CC13)
ALT: 6 U/L (ref 0–55)
AST: 10 U/L (ref 5–34)
Albumin: 3 g/dL — ABNORMAL LOW (ref 3.5–5.0)
Alkaline Phosphatase: 86 U/L (ref 40–150)
Anion Gap: 8 mEq/L (ref 3–11)
BUN: 10.5 mg/dL (ref 7.0–26.0)
CALCIUM: 9.7 mg/dL (ref 8.4–10.4)
CHLORIDE: 102 meq/L (ref 98–109)
CO2: 28 mEq/L (ref 22–29)
CREATININE: 0.9 mg/dL (ref 0.7–1.3)
GLUCOSE: 110 mg/dL (ref 70–140)
Potassium: 4.5 mEq/L (ref 3.5–5.1)
Sodium: 139 mEq/L (ref 136–145)
Total Bilirubin: 0.31 mg/dL (ref 0.20–1.20)
Total Protein: 6.9 g/dL (ref 6.4–8.3)

## 2013-03-11 MED ORDER — IOHEXOL 300 MG/ML  SOLN
80.0000 mL | Freq: Once | INTRAMUSCULAR | Status: AC | PRN
  Administered 2013-03-11: 80 mL via INTRAVENOUS

## 2013-03-14 ENCOUNTER — Ambulatory Visit
Admission: RE | Admit: 2013-03-14 | Discharge: 2013-03-14 | Disposition: A | Discharge status: Home / Self Care | Payer: Commercial Managed Care - PPO | Source: Ambulatory Visit | Attending: Radiation Oncology | Admitting: Radiation Oncology

## 2013-03-14 ENCOUNTER — Encounter: Payer: Self-pay | Admitting: Internal Medicine

## 2013-03-14 ENCOUNTER — Ambulatory Visit (HOSPITAL_BASED_OUTPATIENT_CLINIC_OR_DEPARTMENT_OTHER): Payer: Commercial Managed Care - PPO | Admitting: Internal Medicine

## 2013-03-14 ENCOUNTER — Encounter: Payer: Self-pay | Admitting: Radiation Oncology

## 2013-03-14 ENCOUNTER — Telehealth: Payer: Self-pay | Admitting: *Deleted

## 2013-03-14 ENCOUNTER — Telehealth: Payer: Self-pay | Admitting: Internal Medicine

## 2013-03-14 VITALS — BP 119/58 | HR 87 | Temp 97.9°F | Resp 16 | Wt 158.0 lb

## 2013-03-14 VITALS — BP 119/58 | HR 87 | Temp 97.9°F | Resp 18 | Ht 69.0 in | Wt 158.8 lb

## 2013-03-14 DIAGNOSIS — C3410 Malignant neoplasm of upper lobe, unspecified bronchus or lung: Secondary | ICD-10-CM

## 2013-03-14 NOTE — Progress Notes (Signed)
[facility] [phone]   Fax:([phone]) [place], [place] [address]  DIAGNOSIS: Stage IIIA (T2a, N2, M0) non-small cell lung cancer consistent with squamous cell carcinoma involving the left upper lobe and AP window lymphadenopathy diagnosed in November 2014.  PRIOR THERAPY: Concurrent chemoradiation with weekly carboplatin for AUC of 2 and paclitaxel 45 mg/M2, status post 7 cycles, last dose was given 01/31/2013.  CURRENT THERAPY:   CHEMOTHERAPY INTENT: Curative  CURRENT # OF CHEMOTHERAPY CYCLES: 0  CURRENT ANTIEMETICS: Zofran, Compazine and Decadron  CURRENT SMOKING STATUS: Current smoker. Smoke cessation consult was done.  ORAL CHEMOTHERAPY AND CONSENT: None  CURRENT BISPHOSPHONATES USE: None  PAIN MANAGEMENT: 0/10  NARCOTICS INDUCED CONSTIPATION: None  LIVING WILL AND CODE STATUS: Full code  INTERVAL HISTORY: [name] 63 y.o. male returns to the clinic today for followup visit accompanied by his wife. He tolerated the last dose of his concurrent chemoradiation fairly well with no significant complaints.  He has been observation for the last few weeks after completing the course of concurrent chemoradiation.  The patient denied having any significant chest pain, shortness of breath or hemoptysis. He denied having any fever or chills. He denied having any significant weight loss or night sweats. He had repeat CT scan of the chest performed recently and he is here for evaluation and discussion of his scan results.  MEDICAL HISTORY: Past Medical History  Diagnosis Date  . Crohn's disease   . Testicular cancer 2000    s/p resection, no recurrence    ALLERGIES:  has No Known Allergies.  MEDICATIONS:  Current Outpatient Prescriptions  Medication Sig Dispense Refill  . Astaxanthin 4 MG CAPS Take 1 tablet by mouth daily. With 325 mg of ala      . Cholecalciferol (VITAMIN D-3  PO) Take 8,000 Units by mouth daily.      Marland Kitchen KRILL OIL PO Take 2 capsules by mouth daily.      . Menaquinone-7 (VITAMIN K2 PO) Take 150 mcg by mouth daily.      Marland Kitchen oxycodone (OXY-IR) 5 MG capsule Take 5 mg by mouth as needed.      . Probiotic Product (PROBIOTIC DAILY PO) Take 1 capsule by mouth daily.      Marland Kitchen RESVERATROL PO Take by mouth daily.      . TURMERIC CURCUMIN PO Take by mouth daily.      . varenicline (CHANTIX STARTING MONTH PAK) 0.5 MG X 11 & 1 MG X 42 tablet Take one 0.5 mg tablet by mouth once daily for 3 days, then increase to one 0.5 mg tablet twice daily for 4 days, then increase to one 1 mg tablet twice daily.  53 tablet  0  . vitamin E 200 UNIT capsule Take by mouth daily.       No current facility-administered medications for this visit.    SURGICAL HISTORY:  Past Surgical History  Procedure Laterality Date  . Testicular removal  2000  . Appendectomy    . Hernia repair    . Video bronchoscopy N/A 11/30/2012    Procedure: VIDEO BRONCHOSCOPY WITH FLUORO;  Surgeon: Elsie Stain, MD;  Location: [facility];  Service: Cardiopulmonary;  Laterality: N/A;    REVIEW OF SYSTEMS:  Constitutional: negative Eyes: negative Ears, nose, mouth, throat, and face: negative Respiratory: negative Cardiovascular: negative Gastrointestinal: negative Genitourinary:negative Integument/breast: negative Hematologic/lymphatic: negative Musculoskeletal:negative Neurological: negative Behavioral/Psych: negative Endocrine: negative Allergic/Immunologic:  negative   PHYSICAL EXAMINATION: General appearance: alert, cooperative, fatigued and no distress Head: Normocephalic, without obvious abnormality, atraumatic Neck: no adenopathy, no JVD, supple, symmetrical, trachea midline and thyroid not enlarged, symmetric, no tenderness/mass/nodules Lymph nodes: Cervical, supraclavicular, and axillary nodes normal. Resp: clear to auscultation bilaterally Back: symmetric, no curvature. ROM normal.  No CVA tenderness. Cardio: regular rate and rhythm, S1, S2 normal, no murmur, click, rub or gallop GI: soft, non-tender; bowel sounds normal; no masses,  no organomegaly Extremities: extremities normal, atraumatic, no cyanosis or edema Neurologic: Alert and oriented X 3, normal strength and tone. Normal symmetric reflexes. Normal coordination and gait  ECOG PERFORMANCE STATUS: 1 - Symptomatic but completely ambulatory  Blood pressure 119/58, pulse 87, temperature 97.9 F (36.6 C), temperature source Oral, resp. rate 18, height 5' 9"  (1.753 m), weight 158 lb 12.8 oz (72.031 kg), SpO2 100.00%.  LABORATORY DATA: Lab Results  Component Value Date   WBC 6.6 03/11/2013   HGB 11.5* 03/11/2013   HCT 34.6* 03/11/2013   MCV 94.0 03/11/2013   PLT 219 03/11/2013      Chemistry      Component Value Date/Time   NA 139 03/11/2013 0907   NA 136 11/26/2012 1140   K 4.5 03/11/2013 0907   K 4.2 11/26/2012 1140   CL 102 11/26/2012 1140   CO2 28 03/11/2013 0907   CO2 29 11/26/2012 1140   BUN 10.5 03/11/2013 0907   BUN 9 11/26/2012 1140   CREATININE 0.9 03/11/2013 0907   CREATININE 0.8 11/26/2012 1140      Component Value Date/Time   CALCIUM 9.7 03/11/2013 0907   CALCIUM 9.1 11/26/2012 1140   ALKPHOS 86 03/11/2013 0907   ALKPHOS 96 07/02/2009 1157   AST 10 03/11/2013 0907   AST 14 07/02/2009 1157   ALT 6 03/11/2013 0907   ALT 11 07/02/2009 1157   BILITOT 0.31 03/11/2013 0907   BILITOT 0.6 07/02/2009 1157       RADIOGRAPHIC STUDIES: Ct Chest W Contrast  03/11/2013   CLINICAL DATA:  Lung cancer  EXAM: CT CHEST WITH CONTRAST  TECHNIQUE: Multidetector CT imaging of the chest was performed during intravenous contrast administration.  CONTRAST:  20m OMNIPAQUE IOHEXOL 300 MG/ML  SOLN  COMPARISON:  11/25/2012  FINDINGS: The heart size appears normal. There is no pericardial effusion. 5 mm pre-vascular lymph node is identified, image 23/series 2. Previously 8 mm. Calcified right hilar and sub- carinal lymph nodes are unchanged.  Left perihilar mass has decreased in size from previous exam. This measures 2.1 x 1.6 x 2.9 cm, image 29/series 2. Previously 4.6 x 3.9 x 4.5 cm. Mild changes of external beam radiation identified within the paramediastinal left lung. There is no new or enlarging pulmonary nodule or mass identified.  No axillary or supraclavicular adenopathy identified. Incidental imaging through the upper abdomen is unremarkable. No acute findings identified.  Review of the visualized bony structures is significant for osteopenia and mild multi level spondylosis. No aggressive lytic or sclerotic bone lesion identified.  IMPRESSION: 1. Left perihilar lung mass has decreased in size compared with previous exam compatible with response to therapy. 2. Sub cm prevascular lymph node has also decreased in size in the interval.   Electronically Signed   By: TKerby MoorsM.D.   On: 03/11/2013 11:40   ASSESSMENT AND PLAN: This is a very pleasant 63years old white male recently diagnosed with a stage IIIA non-small cell lung cancer currently undergoing concurrent chemoradiation with weekly carboplatin and paclitaxel  status post 7 cycles.  The patient tolerated the previous course of concurrent chemoradiation fairly well with no significant adverse effects He was advised to call immediately if he has any concerning symptoms in the interval. His recent CT scan of the chest showed partial response with decrease in the size of the left perihilar mass as well as decreased in the prevascular lymph nodes. I reviewed the scan results with the patient and his wife and showed them the images. I discussed with the patient and his wife several options for his treatment including consolidation chemotherapy with 3 cycles of carboplatin and paclitaxel versus observation versus referral for surgical evaluation. After discussions of options the patient and his wife would like to see a cardiothoracic surgeon for discussion of his resectability. I  would see him back for followup visit in 3 weeks for reevaluation and discussion of other treatment options if he is not a surgical candidate.  The patient voices understanding of current disease status and treatment options and is in agreement with the current care plan.  All questions were answered. The patient knows to call the clinic with any problems, questions or concerns. We can certainly see the patient much sooner if necessary. I spent 15 minutes of face-to-face counseling with the patient and his wife to the total visit time 25 minutes.  Disclaimer: This note was dictated with voice recognition software. Similar sounding words can inadvertently be transcribed and may not be corrected upon review.

## 2013-03-14 NOTE — Progress Notes (Signed)
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  The patient had some restless in his legs after treatment with Benadryl for premedication of the Taxol treatment. This improved after he reduced the dose of Benadryl to 25 mg. The patient denied having any significant chest pain, shortness of breath or hemoptysis. He denied having any fever or chills. He denied having any significant weight loss or night sweats. He also complained of insomnia but does not want to take any medication to help him to sleep                             ALLERGIES:  has No Known Allergies.  Meds: Current Outpatient Prescriptions  Medication Sig Dispense Refill  . Astaxanthin 4 MG CAPS Take 1 tablet by mouth daily. With 325 mg of ala      . Cholecalciferol (VITAMIN D-3 PO) Take 8,000 Units by mouth daily.      Marland Kitchen KRILL OIL PO Take 2 capsules by mouth daily.      . Menaquinone-7 (VITAMIN K2 PO) Take 150 mcg by mouth daily.      Marland Kitchen oxycodone (OXY-IR) 5 MG capsule Take 5 mg by mouth as needed.      . Probiotic Product (PROBIOTIC DAILY PO) Take 1 capsule by mouth daily.      Marland Kitchen RESVERATROL PO Take by mouth daily.      . TURMERIC CURCUMIN PO Take by mouth daily.      . varenicline (CHANTIX STARTING MONTH PAK) 0.5 MG X 11 & 1 MG X 42 tablet Take one 0.5 mg tablet by mouth once daily for 3 days, then increase to one 0.5 mg tablet twice daily for 4 days, then increase to one 1 mg tablet twice daily.  53 tablet  0  . vitamin E 200 UNIT capsule Take by mouth daily.        No current facility-administered medications for this encounter.    Physical Findings: The patient is in no acute distress. Patient is alert and oriented.  weight is 158 lb (71.668 kg). His oral temperature is 97.9 F (36.6 C). His blood pressure is 119/58 and his pulse is 87. His respiration is 16 and oxygen saturation is 100%. .  No significant changes.  Lab Findings: Lab Results  Component Value Date   WBC 6.6 03/11/2013   HGB 11.5* 03/11/2013   HCT 34.6* 03/11/2013   MCV 94.0 03/11/2013   PLT 219 03/11/2013    @LASTCHEM @  Radiographic Findings: Ct Chest W Contrast  03/11/2013   CLINICAL DATA:  Lung cancer  EXAM: CT CHEST WITH CONTRAST  TECHNIQUE: Multidetector CT imaging of the chest was performed during intravenous contrast administration.  CONTRAST:  47m OMNIPAQUE IOHEXOL 300 MG/ML  SOLN  COMPARISON:  11/25/2012  FINDINGS: The heart size appears normal. There is no pericardial effusion. 5 mm pre-vascular lymph node is identified, image 23/series 2. Previously 8 mm. Calcified right hilar and sub- carinal lymph nodes  are unchanged. Left perihilar mass has decreased in size from previous exam. This measures 2.1 x 1.6 x 2.9 cm, image 29/series 2. Previously 4.6 x 3.9 x 4.5 cm. Mild changes of external beam radiation identified within the paramediastinal left lung. There is no new or enlarging pulmonary nodule or mass identified.  No axillary or supraclavicular adenopathy identified. Incidental imaging through the upper abdomen is unremarkable. No acute findings identified.  Review of the visualized bony structures is significant for osteopenia and mild multi level spondylosis. No aggressive lytic or sclerotic bone lesion identified.  IMPRESSION: 1. Left perihilar lung mass has decreased in size compared with previous exam compatible with response to therapy. 2. Sub cm prevascular lymph node has also decreased in size in the interval.   Electronically Signed   By: Kerby Moors M.D.   On:  03/11/2013 11:40   Impression:  The patient is recovering from the effects of radiation.  He has experienced some radiographic response to radiation.  Plan:  Continue adjuvant chemo, and follow-up in rad-onc as needed.  _____________________________________  Sheral Apley Tammi Klippel, M.D.

## 2013-03-14 NOTE — Telephone Encounter (Signed)
s.w. pt and advised on 3.31.15 appt...pt ok and aware

## 2013-03-14 NOTE — Telephone Encounter (Signed)
Called left vm message regarding appt for [facility] 03/17/13.

## 2013-03-14 NOTE — Progress Notes (Signed)
The patient had some restless in his legs after treatment with Benadryl for premedication of the Taxol treatment. This improved after we reduce the dose of Benadryl to 25 mg. The patient denied having any significant chest pain, shortness of breath or hemoptysis. He denied having any fever or chills. He denied having any significant weight loss or night sweats. He also complained of insomnia but does not want to take any medication to help him to sleep.

## 2013-03-14 NOTE — Patient Instructions (Signed)
Smoking Cessation, Tips for Success If you are ready to quit smoking, congratulations! You have chosen to help yourself be healthier. Cigarettes bring nicotine, tar, carbon monoxide, and other irritants into your body. Your lungs, heart, and blood vessels will be able to work better without these poisons. There are many different ways to quit smoking. Nicotine gum, nicotine patches, a nicotine inhaler, or nicotine nasal spray can help with physical craving. Hypnosis, support groups, and medicines help break the habit of smoking. WHAT THINGS CAN I DO TO MAKE QUITTING EASIER?  Here are some tips to help you quit for good:  Pick a date when you will quit smoking completely. Tell all of your friends and family about your plan to quit on that date.  Do not try to slowly cut down on the number of cigarettes you are smoking. Pick a quit date and quit smoking completely starting on that day.  Throw away all cigarettes.   Clean and remove all ashtrays from your home, work, and car.   On a card, write down your reasons for quitting. Carry the card with you and read it when you get the urge to smoke.   Cleanse your body of nicotine. Drink enough water and fluids to keep your urine clear or pale yellow. Do this after quitting to flush the nicotine from your body.   Learn to predict your moods. Do not let a bad situation be your excuse to have a cigarette. Some situations in your life might tempt you into wanting a cigarette.   Never have "just one" cigarette. It leads to wanting another and another. Remind yourself of your decision to quit.   Change habits associated with smoking. If you smoked while driving or when feeling stressed, try other activities to replace smoking. Stand up when drinking your coffee. Brush your teeth after eating. Sit in a different chair when you read the paper. Avoid alcohol while trying to quit, and try to drink fewer caffeinated beverages. Alcohol and caffeine may urge  you to smoke.   Avoid foods and drinks that can trigger a desire to smoke, such as sugary or spicy foods and alcohol.   Ask people who smoke not to smoke around you.   Have something planned to do right after eating or having a cup of coffee. For example, plan to take a walk or exercise.   Try a relaxation exercise to calm you down and decrease your stress. Remember, you may be tense and nervous for the first 2 weeks after you quit, but this will pass.   Find new activities to keep your hands busy. Play with a pen, coin, or rubber band. Doodle or draw things on paper.   Brush your teeth right after eating. This will help cut down on the craving for the taste of tobacco after meals. You can also try mouthwash.   Use oral substitutes in place of cigarettes. Try using lemon drops, carrots, cinnamon sticks, or chewing gum. Keep them handy so they are available when you have the urge to smoke.   When you have the urge to smoke, try deep breathing.   Designate your home as a nonsmoking area.   If you are a heavy smoker, ask your health care provider about a prescription for nicotine chewing gum. It can ease your withdrawal from nicotine.   Reward yourself. Set aside the cigarette money you save and buy yourself something nice.   Look for support from others. Join a support group or  smoking cessation program. Ask someone at home or at work to help you with your plan to quit smoking.   Always ask yourself, "Do I need this cigarette or is this just a reflex?" Tell yourself, "Today, I choose not to smoke," or "I do not want to smoke." You are reminding yourself of your decision to quit.  Do not replace cigarette smoking with electronic cigarettes (commonly called e-cigarettes). The safety of e-cigarettes is unknown, and some may contain harmful chemicals.  If you relapse, do not give up! Plan ahead and think about what you will do the next time you get the urge to smoke.  HOW WILL  I FEEL WHEN I QUIT SMOKING? You may have symptoms of withdrawal because your body is used to nicotine (the addictive substance in cigarettes). You may crave cigarettes, be irritable, feel very hungry, cough often, get headaches, or have difficulty concentrating. The withdrawal symptoms are only temporary. They are strongest when you first quit but will go away within 10 14 days. When withdrawal symptoms occur, stay in control. Think about your reasons for quitting. Remind yourself that these are signs that your body is healing and getting used to being without cigarettes. Remember that withdrawal symptoms are easier to treat than the major diseases that smoking can cause.  Even after the withdrawal is over, expect periodic urges to smoke. However, these cravings are generally short lived and will go away whether you smoke or not. Do not smoke!  WHAT RESOURCES ARE AVAILABLE TO HELP ME QUIT SMOKING? Your health care provider can direct you to community resources or hospitals for support, which may include:  Group support.  Education.  Hypnosis.  Therapy. Document Released: 09/21/2003 Document Revised: 10/13/2012 Document Reviewed: 06/10/2012 [facility] Patient Information 2014 [place], [place].

## 2013-03-15 ENCOUNTER — Telehealth: Payer: Self-pay | Admitting: *Deleted

## 2013-03-15 NOTE — Telephone Encounter (Signed)
Called regarding appt time change.  Left vm message along with name and phone number.

## 2013-03-17 ENCOUNTER — Institutional Professional Consult (permissible substitution) (INDEPENDENT_AMBULATORY_CARE_PROVIDER_SITE_OTHER): Payer: Commercial Managed Care - PPO | Admitting: Cardiothoracic Surgery

## 2013-03-17 ENCOUNTER — Encounter: Payer: Self-pay | Admitting: *Deleted

## 2013-03-17 ENCOUNTER — Ambulatory Visit: Payer: Commercial Managed Care - PPO | Admitting: Physical Therapy

## 2013-03-17 DIAGNOSIS — N2889 Other specified disorders of kidney and ureter: Secondary | ICD-10-CM

## 2013-03-17 DIAGNOSIS — N289 Disorder of kidney and ureter, unspecified: Secondary | ICD-10-CM

## 2013-03-17 DIAGNOSIS — J4489 Other specified chronic obstructive pulmonary disease: Secondary | ICD-10-CM

## 2013-03-17 DIAGNOSIS — F17200 Nicotine dependence, unspecified, uncomplicated: Secondary | ICD-10-CM

## 2013-03-17 DIAGNOSIS — C3410 Malignant neoplasm of upper lobe, unspecified bronchus or lung: Secondary | ICD-10-CM

## 2013-03-17 DIAGNOSIS — J449 Chronic obstructive pulmonary disease, unspecified: Secondary | ICD-10-CM

## 2013-03-18 NOTE — Progress Notes (Signed)
[address]       [place] [address]             [phone]                    Zvi E Baize Pump Back Medical Record #[id number] Date of Birth: 10/06/1950  Referring: Cari Caraway, MD Primary Care: Cari Caraway, MD  Chief Complaint:   Lung Cancer   History of Present Illness:    [name] 63 y.o. male is seen in the office  today for squamous cell cancer of the lung. Stage IIIA (T2a, N2, M0) non-small cell lung cancer consistent with squamous cell carcinoma involving the left upper lobe and AP window lymphadenopathy diagnosed in November 2014. By bronchoscopy done by Dr Joya Gaskins. PET scan done , no bx of nodes. During evaluation he was also found to have a large rt renal mass consistent with renal cell CA .Patient continues to smoke. He is seen today for first time in [facility] clinic to consider pulmonary resection  Current  THERAPY: Concurrent chemoradiation with weekly carboplatin for AUC of 2 and paclitaxel 45 mg/M2, status post 7 cycles, last dose was given 01/31/2013.       Current Activity/ Functional Status:  Patient is independent with mobility/ambulation, transfers, ADL's, IADL's.   Zubrod Score: At the time of surgery this patient's most appropriate activity status/level should be described as: []     0    Normal activity, no symptoms [x]     1    Restricted in physical strenuous activity but ambulatory, able to do out light work []     2    Ambulatory and capable of self care, unable to do work activities, up and about               >50 % of waking hours                              []     3    Only limited self care, in bed greater than 50% of waking hours []     4    Completely disabled, no self care, confined to bed or chair []     5    Moribund   Past Medical History  Diagnosis Date  . Crohn's disease   . Testicular cancer 2000    s/p resection, no recurrence    Past Surgical History  Procedure Laterality Date  . Testicular  removal  2000  . Appendectomy    . Hernia repair    . Video bronchoscopy N/A 11/30/2012    Procedure: VIDEO BRONCHOSCOPY WITH FLUORO;  Surgeon: Elsie Stain, MD;  Location: [facility];  Service: Cardiopulmonary;  Laterality: N/A;    Family History  Problem Relation Age of Onset  . Asthma Brother   . Testicular cancer Brother   . Cancer Brother   . Breast cancer Sister     History   Social History  . Marital Status: Married    Spouse Name: N/A    Number of Children: N/A  . Years of Education: N/A   Occupational History  . Consultant    Social History Main Topics  . Smoking status: Current Every Day Smoker -- 0.75 packs/day    Types: Cigarettes    Start date: 01/06/1966  . Smokeless tobacco: Never Used     Comment: Smoking less than 1 ppd right now  .  Alcohol Use: Yes     Comment: 2-3 beers per day  . Drug Use: No  . Sexual Activity: Not on file   Other Topics Concern  . Not on file   Social History Narrative  . No narrative on file    History  Smoking status  . Current Every Day Smoker -- 0.75 packs/day  . Types: Cigarettes  . Start date: 01/06/1966  Smokeless tobacco  . Never Used    Comment: Smoking less than 1 ppd right now    History  Alcohol Use  . Yes    Comment: 2-3 beers per day     No Known Allergies  Current Outpatient Prescriptions  Medication Sig Dispense Refill  . Astaxanthin 4 MG CAPS Take 1 tablet by mouth daily. With 325 mg of ala      . Cholecalciferol (VITAMIN D-3 PO) Take 8,000 Units by mouth daily.      Marland Kitchen KRILL OIL PO Take 2 capsules by mouth daily.      . Menaquinone-7 (VITAMIN K2 PO) Take 150 mcg by mouth daily.      Marland Kitchen oxycodone (OXY-IR) 5 MG capsule Take 5 mg by mouth as needed.      . Probiotic Product (PROBIOTIC DAILY PO) Take 1 capsule by mouth daily.      Marland Kitchen RESVERATROL PO Take by mouth daily.      . TURMERIC CURCUMIN PO Take by mouth daily.      . varenicline (CHANTIX STARTING MONTH PAK) 0.5 MG X 11 & 1 MG X 42  tablet Take one 0.5 mg tablet by mouth once daily for 3 days, then increase to one 0.5 mg tablet twice daily for 4 days, then increase to one 1 mg tablet twice daily.  53 tablet  0  . vitamin E 200 UNIT capsule Take by mouth daily.       No current facility-administered medications for this visit.     Review of Systems:     Cardiac Review of Systems: Y or N  Chest Pain [ y   ]  Resting SOB [ n  ] Exertional SOB  Blue.Reese  ]  Orthopnea Florencio.Farrier  ]   Pedal Edema [ n  ]    Palpitations [ n ] Syncope  [ n ]   Presyncope [  n ]  General Review of Systems: [Y] = yes [  ]=no Constitional: recent weight change Blue.Reese  ];  Wt loss over the last 3 months [   ] anorexia [  ]; fatigue [  ]; nausea [  ]; night sweats [  ]; fever [  ]; or chills [  ];          Dental: poor dentition[n]; Last Dentist visit:   Eye : blurred vision [  ]; diplopia [   ]; vision changes [  ];  Amaurosis fugax[  ]; Resp: cough [  ];  wheezing[  ];  hemoptysis[  ]; shortness of breath[  ]; paroxysmal nocturnal dyspnea[  ]; dyspnea on exertion[  ]; or orthopnea[  ];  GI:  gallstones[  ], vomiting[  ];  dysphagia[  ]; melena[  ];  hematochezia [  ]; heartburn[  ];   Hx of  Colonoscopy[  ]; GU: kidney stones [  ]; hematuria[  ];   dysuria [  ];  nocturia[  ];  history of     obstruction [  ]; urinary frequency [  ]  Skin: rash, swelling[  ];, hair loss[  ];  peripheral edema[  ];  or itching[  ]; Musculosketetal: myalgias[  ];  joint swelling[  ];  joint erythema[  ];  joint pain[  ];  back pain[  ];  Heme/Lymph: bruising[  ];  bleeding[  ];  anemia[  ];  Neuro: TIA[ n ];  headaches[  ];  stroke[ n ];  vertigo[  ];  seizures[  ];   paresthesias[  ];  difficulty walking[  ];  Psych:depression[  ]; anxiety[  ];  Endocrine: diabetes[n  ];  thyroid dysfunction[ n ];  Immunizations: Flu up to date [  ]; Pneumococcal up to date [  ];  Other:    PHYSICAL EXAMINATION: Wt Readings from Last 3 Encounters:  03/17/13 158 lb 9.6 oz (71.94 kg)          Temp Readings from Last 3 Encounters:  03/17/13 98.1 F (36.7 C)          BP Readings from Last 3 Encounters:  03/17/13 126/70         Pulse Readings from Last 3 Encounters:  03/17/13 87         General appearance: alert and cooperative Neurologic: intact Heart: regular rate and rhythm, S1, S2 normal, no murmur, click, rub or gallop Lungs: clear to auscultation bilaterally Abdomen: soft, non-tender; bowel sounds normal; no masses,  no organomegaly Extremities: extremities normal, atraumatic, no cyanosis or edema and Homans sign is negative, no sign of DVT No cervical or axillary adenopathy  Diagnostic Studies & Laboratory data:     Recent Radiology Findings:   Ct Chest W Contrast  03/11/2013   CLINICAL DATA:  Lung cancer  EXAM: CT CHEST WITH CONTRAST  TECHNIQUE: Multidetector CT imaging of the chest was performed during intravenous contrast administration.  CONTRAST:  69m OMNIPAQUE IOHEXOL 300 MG/ML  SOLN  COMPARISON:  11/25/2012  FINDINGS: The heart size appears normal. There is no pericardial effusion. 5 mm pre-vascular lymph node is identified, image 23/series 2. Previously 8 mm. Calcified right hilar and sub- carinal lymph nodes are unchanged. Left perihilar mass has decreased in size from previous exam. This measures 2.1 x 1.6 x 2.9 cm, image 29/series 2. Previously 4.6 x 3.9 x 4.5 cm. Mild changes of external beam radiation identified within the paramediastinal left lung. There is no new or enlarging pulmonary nodule or mass identified.  No axillary or supraclavicular adenopathy identified. Incidental imaging through the upper abdomen is unremarkable. No acute findings identified.  Review of the visualized bony structures is significant for osteopenia and mild multi level spondylosis. No aggressive lytic or sclerotic bone lesion identified.  IMPRESSION: 1. Left perihilar lung mass has decreased in size compared with previous exam compatible with response to therapy. 2.  Sub cm prevascular lymph node has also decreased in size in the interval.   Electronically Signed   By: TKerby MoorsM.D.   On: 03/11/2013 11:40      Recent Lab Findings: Lab Results  Component Value Date   WBC 6.6 03/11/2013   HGB 11.5* 03/11/2013   HCT 34.6* 03/11/2013   PLT 219 03/11/2013   GLUCOSE 110 03/11/2013   ALT 6 03/11/2013   AST 10 03/11/2013   NA 139 03/11/2013   K 4.5 03/11/2013   CL 102 11/26/2012   CREATININE 0.9 03/11/2013   BUN 10.5 03/11/2013   CO2 28 03/11/2013   INR 1.08 07/02/2009   FEV1 bedside 60% predicted, full PFTS not done  Assessment / Plan:   Patient who is current smoker presents after course of radiation and chemo for Stage IIIA  (T2a, N2, M0) squamous cell ca of lung with 5.8 x 4.5 x 4.5 Renal cell cancer. The patient scans were reviewed in [facility] conference, It appeared that patient had a good response to treatment but a clean resection would not be obtained without a pneumonectomy and maybe not then. In the setting of   Of Stage IIIa disease in the chest, a large reanl cell carcinoma so far not treated I would not recommend pulmonary resection. This has been discussed with the patient and his wife. Discussed with Dr Mckinley Jewel , timing of further chemo vs proceeding with renal resection. He will contact Urology.      I spent 40 minutes counseling the patient face to face. The total time spent in the appointment was 60 minutes.  Grace Isaac MD      [address] [place] [address] Office [phone]   Beeper [phone]  03/18/2013 2:49 PM

## 2013-03-23 ENCOUNTER — Other Ambulatory Visit: Payer: Self-pay | Admitting: Urology

## 2013-03-23 NOTE — Progress Notes (Signed)
Surgery on 03/25/13.  Preop on 03/30/13 at 100pm.   Need orders in EPIC.  Thank You.

## 2013-03-25 ENCOUNTER — Encounter (HOSPITAL_COMMUNITY): Payer: Self-pay | Admitting: Pharmacy Technician

## 2013-03-30 ENCOUNTER — Encounter (HOSPITAL_COMMUNITY): Payer: Self-pay

## 2013-03-30 ENCOUNTER — Other Ambulatory Visit (HOSPITAL_COMMUNITY): Payer: Self-pay | Admitting: *Deleted

## 2013-03-30 ENCOUNTER — Encounter (HOSPITAL_COMMUNITY)
Admission: RE | Admit: 2013-03-30 | Discharge: 2013-03-30 | Disposition: A | Discharge status: Home / Self Care | Payer: Commercial Managed Care - PPO | Source: Ambulatory Visit | Attending: Urology | Admitting: Urology

## 2013-03-30 DIAGNOSIS — Z01812 Encounter for preprocedural laboratory examination: Secondary | ICD-10-CM | POA: Insufficient documentation

## 2013-03-30 HISTORY — DX: Sleep disorder, unspecified: G47.9

## 2013-03-30 HISTORY — DX: Other specified disorders of kidney and ureter: N28.89

## 2013-03-30 HISTORY — DX: Malignant neoplasm of unspecified kidney, except renal pelvis: C64.9

## 2013-03-30 HISTORY — DX: Malignant neoplasm of unspecified part of unspecified bronchus or lung: C34.90

## 2013-03-30 LAB — COMPREHENSIVE METABOLIC PANEL
ALBUMIN: 3.4 g/dL — AB (ref 3.5–5.2)
ALT: 6 U/L (ref 0–53)
AST: 9 U/L (ref 0–37)
Alkaline Phosphatase: 106 U/L (ref 39–117)
BUN: 11 mg/dL (ref 6–23)
CALCIUM: 10.1 mg/dL (ref 8.4–10.5)
CO2: 26 mEq/L (ref 19–32)
Chloride: 99 mEq/L (ref 96–112)
Creatinine, Ser: 0.79 mg/dL (ref 0.50–1.35)
GFR calc non Af Amer: >90 mL/min (ref >90)
GLUCOSE: 112 mg/dL — AB (ref 70–99)
POTASSIUM: 4.7 meq/L (ref 3.7–5.3)
SODIUM: 136 meq/L — AB (ref 137–147)
Total Bilirubin: 0.3 mg/dL (ref 0.3–1.2)
Total Protein: 7.6 g/dL (ref 6.0–8.3)

## 2013-03-30 LAB — CBC
HEMATOCRIT: 40.1 % (ref 39.0–52.0)
HEMOGLOBIN: 13 g/dL (ref 13.0–17.0)
MCH: 30.9 pg (ref 26.0–34.0)
MCHC: 32.4 g/dL (ref 30.0–36.0)
MCV: 95.2 fL (ref 78.0–100.0)
Platelets: 368 10*3/uL (ref 150–400)
RBC: 4.21 MIL/uL — ABNORMAL LOW (ref 4.22–5.81)
RDW: 14.8 % (ref 11.5–15.5)
WBC: 5.8 10*3/uL (ref 4.0–10.5)

## 2013-03-30 NOTE — Progress Notes (Signed)
EKG from [employer] [facility] [facility] on chart - done 03/29/13

## 2013-03-30 NOTE — Patient Instructions (Addendum)
[name]  03/30/2013                           YOUR PROCEDURE IS SCHEDULED ON: 04/04/13               PLEASE REPORT TO SHORT STAY CENTER AT : 5:30 am               CALL THIS NUMBER IF ANY PROBLEMS THE DAY OF SURGERY :              [phone]                                REMEMBER:   Do not eat food or drink liquids AFTER MIDNIGHT                 Take these medicines the morning of surgery with A SIP OF WATER: OXYCODONE   Do not wear jewelry, make-up   Do not wear lotions, powders, or perfumes.   Do not shave legs or underarms 12 hrs. before surgery (men may shave face)  Do not bring valuables to the hospital.  Contacts, dentures or bridgework may not be worn into surgery.  Leave suitcase in the car. After surgery it may be brought to your room.  For patients admitted to the hospital more than one night, checkout time is            11:00 AM                                                       The day of discharge.   Patients discharged the day of surgery will not be allowed to drive home.            If going home same day of surgery, must have someone stay with you              FIRST 24 hrs at home and arrange for some one to drive you              home from hospital.    Special Instructions             Please read over the following fact sheets that you were given:               1. [name]                                                X_____________________________________________________________________        Failure to follow these instructions may result in cancellation of your surgery

## 2013-03-31 LAB — URINE CULTURE
CULTURE: NO GROWTH
Colony Count: NO GROWTH

## 2013-04-01 ENCOUNTER — Telehealth: Payer: Self-pay | Admitting: Internal Medicine

## 2013-04-01 NOTE — Telephone Encounter (Signed)
pt called to rs appt due to having kidney removed....r/s appt per pt request...per aware of new d.t

## 2013-04-03 NOTE — Anesthesia Preprocedure Evaluation (Addendum)
Anesthesia Evaluation  Patient identified by MRN, date of birth, ID band Patient awake    Reviewed: Allergy & Precautions, H&P , NPO status , Patient's Chart, lab work & pertinent test results  Airway Mallampati: II TM Distance: >3 FB Neck ROM: Full    Dental  (+) Dental Advisory Given   Pulmonary COPDCurrent Smoker,  breath sounds clear to auscultation        Cardiovascular negative cardio ROS  Rhythm:Regular Rate:Normal     Neuro/Psych negative neurological ROS  negative psych ROS   GI/Hepatic negative GI ROS, Neg liver ROS,   Endo/Other  negative endocrine ROS  Renal/GU negative Renal ROSRenal mass     Musculoskeletal negative musculoskeletal ROS (+)   Abdominal   Peds  Hematology negative hematology ROS (+)   Anesthesia Other Findings   Reproductive/Obstetrics                          Anesthesia Physical Anesthesia Plan  ASA: III  Anesthesia Plan: General   Post-op Pain Management:    Induction: Intravenous  Airway Management Planned: Oral ETT  Additional Equipment:   Intra-op Plan:   Post-operative Plan: Extubation in OR  Informed Consent: I have reviewed the patients History and Physical, chart, labs and discussed the procedure including the risks, benefits and alternatives for the proposed anesthesia with the patient or authorized representative who has indicated his/her understanding and acceptance.   Dental advisory given  Plan Discussed with: CRNA  Anesthesia Plan Comments:        Anesthesia Quick Evaluation

## 2013-04-04 ENCOUNTER — Inpatient Hospital Stay (HOSPITAL_COMMUNITY)
Admission: RE | Admit: 2013-04-04 | Discharge: 2013-04-05 | DRG: 657 | Disposition: A | Discharge status: Home / Self Care | Payer: Commercial Managed Care - PPO | Source: Ambulatory Visit | Attending: Urology | Admitting: Urology

## 2013-04-04 ENCOUNTER — Encounter (HOSPITAL_COMMUNITY): Payer: Self-pay | Admitting: *Deleted

## 2013-04-04 ENCOUNTER — Inpatient Hospital Stay (HOSPITAL_COMMUNITY): Payer: Commercial Managed Care - PPO | Admitting: Anesthesiology

## 2013-04-04 ENCOUNTER — Encounter (HOSPITAL_COMMUNITY)
Admission: RE | Disposition: A | Discharge status: Home / Self Care | Payer: Self-pay | Source: Ambulatory Visit | Attending: Urology

## 2013-04-04 ENCOUNTER — Encounter (HOSPITAL_COMMUNITY): Payer: Commercial Managed Care - PPO | Admitting: Anesthesiology

## 2013-04-04 DIAGNOSIS — Z8547 Personal history of malignant neoplasm of testis: Secondary | ICD-10-CM

## 2013-04-04 DIAGNOSIS — C3490 Malignant neoplasm of unspecified part of unspecified bronchus or lung: Secondary | ICD-10-CM | POA: Diagnosis present

## 2013-04-04 DIAGNOSIS — Z825 Family history of asthma and other chronic lower respiratory diseases: Secondary | ICD-10-CM

## 2013-04-04 DIAGNOSIS — R109 Unspecified abdominal pain: Secondary | ICD-10-CM | POA: Diagnosis present

## 2013-04-04 DIAGNOSIS — J4489 Other specified chronic obstructive pulmonary disease: Secondary | ICD-10-CM | POA: Diagnosis present

## 2013-04-04 DIAGNOSIS — J449 Chronic obstructive pulmonary disease, unspecified: Secondary | ICD-10-CM | POA: Diagnosis present

## 2013-04-04 DIAGNOSIS — F17200 Nicotine dependence, unspecified, uncomplicated: Secondary | ICD-10-CM | POA: Diagnosis present

## 2013-04-04 DIAGNOSIS — Z803 Family history of malignant neoplasm of breast: Secondary | ICD-10-CM

## 2013-04-04 DIAGNOSIS — Z01812 Encounter for preprocedural laboratory examination: Secondary | ICD-10-CM

## 2013-04-04 DIAGNOSIS — Z9221 Personal history of antineoplastic chemotherapy: Secondary | ICD-10-CM

## 2013-04-04 DIAGNOSIS — N2889 Other specified disorders of kidney and ureter: Secondary | ICD-10-CM

## 2013-04-04 DIAGNOSIS — C649 Malignant neoplasm of unspecified kidney, except renal pelvis: Principal | ICD-10-CM | POA: Diagnosis present

## 2013-04-04 DIAGNOSIS — Z8042 Family history of malignant neoplasm of prostate: Secondary | ICD-10-CM

## 2013-04-04 HISTORY — PX: LAPAROSCOPIC NEPHRECTOMY: SHX1930

## 2013-04-04 LAB — BASIC METABOLIC PANEL
BUN: 10 mg/dL (ref 6–23)
CALCIUM: 8.8 mg/dL (ref 8.4–10.5)
CO2: 26 mEq/L (ref 19–32)
CREATININE: 0.96 mg/dL (ref 0.50–1.35)
Chloride: 101 mEq/L (ref 96–112)
GFR calc Af Amer: >90 mL/min (ref >90)
GFR calc non Af Amer: 86 mL/min — ABNORMAL LOW (ref >90)
GLUCOSE: 143 mg/dL — AB (ref 70–99)
Potassium: 4.1 mEq/L (ref 3.7–5.3)
Sodium: 137 mEq/L (ref 137–147)

## 2013-04-04 LAB — CBC
HCT: 34.4 % — ABNORMAL LOW (ref 39.0–52.0)
Hemoglobin: 11.2 g/dL — ABNORMAL LOW (ref 13.0–17.0)
MCH: 30.9 pg (ref 26.0–34.0)
MCHC: 32.6 g/dL (ref 30.0–36.0)
MCV: 95 fL (ref 78.0–100.0)
PLATELETS: 286 10*3/uL (ref 150–400)
RBC: 3.62 MIL/uL — AB (ref 4.22–5.81)
RDW: 14.4 % (ref 11.5–15.5)
WBC: 12.7 10*3/uL — ABNORMAL HIGH (ref 4.0–10.5)

## 2013-04-04 LAB — ABO/RH: ABO/RH(D): O NEG

## 2013-04-04 SURGERY — NEPHRECTOMY, RADICAL, LAPAROSCOPIC, ADULT
Anesthesia: General | Laterality: Left

## 2013-04-04 MED ORDER — KETAMINE HCL 10 MG/ML IJ SOLN
INTRAMUSCULAR | Status: DC | PRN
  Administered 2013-04-04: 30 mg via INTRAVENOUS

## 2013-04-04 MED ORDER — LACTATED RINGERS IV SOLN
INTRAVENOUS | Status: DC | PRN
  Administered 2013-04-04: 08:00:00 via INTRAVENOUS

## 2013-04-04 MED ORDER — MIDAZOLAM HCL 2 MG/2ML IJ SOLN
INTRAMUSCULAR | Status: AC
  Filled 2013-04-04: qty 2

## 2013-04-04 MED ORDER — HYDROMORPHONE HCL PF 1 MG/ML IJ SOLN: 0.2500 mg | INTRAMUSCULAR | Status: DC | PRN

## 2013-04-04 MED ORDER — PANTOPRAZOLE SODIUM 40 MG IV SOLR: 40.0000 mg | INTRAVENOUS | Status: DC

## 2013-04-04 MED ORDER — HYDRALAZINE HCL 20 MG/ML IJ SOLN
5.0000 mg | INTRAMUSCULAR | Status: DC | PRN
Start: 2013-04-04 — End: 2013-04-05
  Filled 2013-04-04: qty 0.25

## 2013-04-04 MED ORDER — ONDANSETRON HCL 4 MG/2ML IJ SOLN
INTRAMUSCULAR | Status: AC
  Filled 2013-04-04: qty 2

## 2013-04-04 MED ORDER — HYDROMORPHONE HCL PF 1 MG/ML IJ SOLN
0.5000 mg | INTRAMUSCULAR | Status: DC | PRN
Start: 2013-04-04 — End: 2013-04-05
  Administered 2013-04-05: 0.5 mg via INTRAVENOUS
  Filled 2013-04-04: qty 1

## 2013-04-04 MED ORDER — ZOLPIDEM TARTRATE 5 MG PO TABS: 5.0000 mg | ORAL_TABLET | Freq: Every evening | ORAL | Status: DC | PRN

## 2013-04-04 MED ORDER — LIDOCAINE HCL (CARDIAC) 20 MG/ML IV SOLN
INTRAVENOUS | Status: DC | PRN
Start: 2013-04-04 — End: 2013-04-04
  Administered 2013-04-04: 30 mg via INTRAVENOUS

## 2013-04-04 MED ORDER — CISATRACURIUM BESYLATE 20 MG/10ML IV SOLN
INTRAVENOUS | Status: AC
  Filled 2013-04-04: qty 10

## 2013-04-04 MED ORDER — HEPARIN SODIUM (PORCINE) 5000 UNIT/ML IJ SOLN
5000.0000 [IU] | Freq: Three times a day (TID) | INTRAMUSCULAR | Status: DC
  Administered 2013-04-04: 5000 [IU] via SUBCUTANEOUS
  Filled 2013-04-04 (×5): qty 1

## 2013-04-04 MED ORDER — SODIUM CHLORIDE 0.9 % IJ SOLN
INTRAMUSCULAR | Status: AC
  Filled 2013-04-04: qty 20

## 2013-04-04 MED ORDER — VARENICLINE TARTRATE 0.5 MG X 11 & 1 MG X 42 PO MISC: 1.0000 | Freq: Two times a day (BID) | ORAL | Status: DC

## 2013-04-04 MED ORDER — GLYCOPYRROLATE 0.2 MG/ML IJ SOLN
INTRAMUSCULAR | Status: DC | PRN
  Administered 2013-04-04: 0.6 mg via INTRAVENOUS

## 2013-04-04 MED ORDER — ONDANSETRON HCL 4 MG/2ML IJ SOLN
INTRAMUSCULAR | Status: DC | PRN
  Administered 2013-04-04 (×2): 2 mg via INTRAVENOUS

## 2013-04-04 MED ORDER — 0.9 % SODIUM CHLORIDE (POUR BTL) OPTIME
TOPICAL | Status: DC | PRN
  Administered 2013-04-04: 2000 mL

## 2013-04-04 MED ORDER — HYDROMORPHONE HCL PF 2 MG/ML IJ SOLN
INTRAMUSCULAR | Status: AC
  Filled 2013-04-04: qty 1

## 2013-04-04 MED ORDER — NEOSTIGMINE METHYLSULFATE 1 MG/ML IJ SOLN
INTRAMUSCULAR | Status: DC | PRN
  Administered 2013-04-04: 5 mg via INTRAVENOUS

## 2013-04-04 MED ORDER — FENTANYL CITRATE 0.05 MG/ML IJ SOLN
INTRAMUSCULAR | Status: AC
  Filled 2013-04-04: qty 5

## 2013-04-04 MED ORDER — DEXAMETHASONE SODIUM PHOSPHATE 10 MG/ML IJ SOLN
INTRAMUSCULAR | Status: AC
  Filled 2013-04-04: qty 1

## 2013-04-04 MED ORDER — PROPOFOL 10 MG/ML IV BOLUS
INTRAVENOUS | Status: DC | PRN
  Administered 2013-04-04: 200 mg via INTRAVENOUS

## 2013-04-04 MED ORDER — SODIUM CHLORIDE 0.9 % IJ SOLN
INTRAMUSCULAR | Status: AC
  Filled 2013-04-04: qty 10

## 2013-04-04 MED ORDER — KETAMINE HCL 10 MG/ML IJ SOLN
INTRAMUSCULAR | Status: AC
  Filled 2013-04-04: qty 1

## 2013-04-04 MED ORDER — PANTOPRAZOLE SODIUM 40 MG PO TBEC
40.0000 mg | DELAYED_RELEASE_TABLET | Freq: Every day | ORAL | Status: DC
  Filled 2013-04-04: qty 1

## 2013-04-04 MED ORDER — EPHEDRINE SULFATE 50 MG/ML IJ SOLN
INTRAMUSCULAR | Status: DC | PRN
  Administered 2013-04-04 (×3): 10 mg via INTRAVENOUS
  Administered 2013-04-04: 5 mg via INTRAVENOUS

## 2013-04-04 MED ORDER — PROMETHAZINE HCL 25 MG/ML IJ SOLN: 6.2500 mg | INTRAMUSCULAR | Status: DC | PRN

## 2013-04-04 MED ORDER — CISATRACURIUM BESYLATE (PF) 10 MG/5ML IV SOLN
INTRAVENOUS | Status: DC | PRN
  Administered 2013-04-04: 4 mg via INTRAVENOUS
  Administered 2013-04-04: 2 mg via INTRAVENOUS
  Administered 2013-04-04: 4 mg via INTRAVENOUS
  Administered 2013-04-04: 3 mg via INTRAVENOUS
  Administered 2013-04-04: 10 mg via INTRAVENOUS
  Administered 2013-04-04 (×2): 2 mg via INTRAVENOUS

## 2013-04-04 MED ORDER — SUCCINYLCHOLINE CHLORIDE 20 MG/ML IJ SOLN
INTRAMUSCULAR | Status: DC | PRN
  Administered 2013-04-04: 140 mg via INTRAVENOUS

## 2013-04-04 MED ORDER — SODIUM CHLORIDE 0.9 % IV SOLN
INTRAVENOUS | Status: DC
  Administered 2013-04-04 – 2013-04-05 (×2): via INTRAVENOUS

## 2013-04-04 MED ORDER — GLYCOPYRROLATE 0.2 MG/ML IJ SOLN
INTRAMUSCULAR | Status: AC
Start: 2013-04-04 — End: 2013-04-04
  Filled 2013-04-04: qty 2

## 2013-04-04 MED ORDER — EPHEDRINE SULFATE 50 MG/ML IJ SOLN
INTRAMUSCULAR | Status: AC
  Filled 2013-04-04: qty 1

## 2013-04-04 MED ORDER — ONDANSETRON HCL 4 MG PO TABS: 4.0000 mg | ORAL_TABLET | Freq: Three times a day (TID) | ORAL | Status: DC | PRN

## 2013-04-04 MED ORDER — FENTANYL CITRATE 0.05 MG/ML IJ SOLN
INTRAMUSCULAR | Status: DC | PRN
  Administered 2013-04-04 (×2): 25 ug via INTRAVENOUS
  Administered 2013-04-04: 50 ug via INTRAVENOUS
  Administered 2013-04-04 (×2): 25 ug via INTRAVENOUS
  Administered 2013-04-04: 50 ug via INTRAVENOUS
  Administered 2013-04-04: 25 ug via INTRAVENOUS
  Administered 2013-04-04 (×2): 50 ug via INTRAVENOUS
  Administered 2013-04-04: 25 ug via INTRAVENOUS
  Administered 2013-04-04 (×2): 50 ug via INTRAVENOUS

## 2013-04-04 MED ORDER — LIDOCAINE HCL (CARDIAC) 20 MG/ML IV SOLN
INTRAVENOUS | Status: AC
  Filled 2013-04-04: qty 10

## 2013-04-04 MED ORDER — ACETAMINOPHEN 10 MG/ML IV SOLN
1000.0000 mg | Freq: Four times a day (QID) | INTRAVENOUS | Status: AC
  Administered 2013-04-04 – 2013-04-05 (×3): 1000 mg via INTRAVENOUS
  Filled 2013-04-04 (×4): qty 100

## 2013-04-04 MED ORDER — CEFAZOLIN SODIUM-DEXTROSE 2-3 GM-% IV SOLR
2.0000 g | INTRAVENOUS | Status: AC
Start: 2013-04-04 — End: 2013-04-04
  Administered 2013-04-04: 2 g via INTRAVENOUS

## 2013-04-04 MED ORDER — DOCUSATE SODIUM 100 MG PO CAPS
100.0000 mg | ORAL_CAPSULE | Freq: Two times a day (BID) | ORAL | Status: DC
  Administered 2013-04-05: 100 mg via ORAL
  Filled 2013-04-04 (×3): qty 1

## 2013-04-04 MED ORDER — HYDROMORPHONE HCL 2 MG PO TABS
6.0000 mg | ORAL_TABLET | ORAL | Status: DC | PRN
  Administered 2013-04-04 – 2013-04-05 (×5): 6 mg via ORAL
  Filled 2013-04-04 (×5): qty 3

## 2013-04-04 MED ORDER — LACTATED RINGERS IR SOLN
Status: DC | PRN
  Administered 2013-04-04: 1000 mL

## 2013-04-04 MED ORDER — MIDAZOLAM HCL 5 MG/5ML IJ SOLN
INTRAMUSCULAR | Status: DC | PRN
  Administered 2013-04-04: 1 mg via INTRAVENOUS

## 2013-04-04 MED ORDER — PROPOFOL 10 MG/ML IV BOLUS
INTRAVENOUS | Status: AC
  Filled 2013-04-04: qty 20

## 2013-04-04 MED ORDER — GLYCOPYRROLATE 0.2 MG/ML IJ SOLN
INTRAMUSCULAR | Status: AC
  Filled 2013-04-04: qty 3

## 2013-04-04 MED ORDER — NEOSTIGMINE METHYLSULFATE 1 MG/ML IJ SOLN
INTRAMUSCULAR | Status: AC
  Filled 2013-04-04: qty 10

## 2013-04-04 MED ORDER — LACTATED RINGERS IV SOLN
INTRAVENOUS | Status: DC | PRN
  Administered 2013-04-04 (×2): via INTRAVENOUS

## 2013-04-04 MED ORDER — BUPIVACAINE LIPOSOME 1.3 % IJ SUSP
20.0000 mL | Freq: Once | INTRAMUSCULAR | Status: AC
  Administered 2013-04-04: 40 mL
  Filled 2013-04-04: qty 20

## 2013-04-04 MED ORDER — ONDANSETRON HCL 4 MG/2ML IJ SOLN: 4.0000 mg | Freq: Four times a day (QID) | INTRAMUSCULAR | Status: DC | PRN

## 2013-04-04 MED ORDER — VARENICLINE TARTRATE 1 MG PO TABS
1.0000 mg | ORAL_TABLET | Freq: Two times a day (BID) | ORAL | Status: DC
  Filled 2013-04-04 (×3): qty 1

## 2013-04-04 MED ORDER — CEFAZOLIN SODIUM-DEXTROSE 2-3 GM-% IV SOLR
INTRAVENOUS | Status: AC
  Filled 2013-04-04: qty 50

## 2013-04-04 MED ORDER — DEXAMETHASONE SODIUM PHOSPHATE 4 MG/ML IJ SOLN
INTRAMUSCULAR | Status: DC | PRN
  Administered 2013-04-04: 10 mg via INTRAVENOUS

## 2013-04-04 MED ORDER — MEPERIDINE HCL 50 MG/ML IJ SOLN: 6.2500 mg | INTRAMUSCULAR | Status: DC | PRN

## 2013-04-04 MED ORDER — PHENYLEPHRINE HCL 10 MG/ML IJ SOLN
INTRAMUSCULAR | Status: DC | PRN
  Administered 2013-04-04 (×2): 40 ug via INTRAVENOUS

## 2013-04-04 MED ORDER — HYDROMORPHONE HCL PF 1 MG/ML IJ SOLN
INTRAMUSCULAR | Status: DC | PRN
  Administered 2013-04-04 (×4): 0.5 mg via INTRAVENOUS

## 2013-04-04 SURGICAL SUPPLY — 76 items
ADH SKN CLS APL DERMABOND .7 (GAUZE/BANDAGES/DRESSINGS) ×1
APL SKNCLS STERI-STRIP NONHPOA (GAUZE/BANDAGES/DRESSINGS)
APL SRG 32X5 SNPLK LF DISP (MISCELLANEOUS)
APPLIER CLIP ROT 10 11.4 M/L (STAPLE)
APR CLP MED LRG 11.4X10 (STAPLE)
BAG SPEC THK2 15X12 ZIP CLS (MISCELLANEOUS) ×1
BAG ZIPLOCK 12X15 (MISCELLANEOUS) ×3 IMPLANT
BENZOIN TINCTURE PRP APPL 2/3 (GAUZE/BANDAGES/DRESSINGS) IMPLANT
BLADE EXTENDED COATED 6.5IN (ELECTRODE) IMPLANT
BLADE SURG SZ10 CARB STEEL (BLADE) ×3 IMPLANT
CHLORAPREP W/TINT 26ML (MISCELLANEOUS) ×3 IMPLANT
CLIP APPLIE ROT 10 11.4 M/L (STAPLE) IMPLANT
CLIP LIGATING HEM O LOK PURPLE (MISCELLANEOUS) ×5 IMPLANT
CLIP LIGATING HEMO LOK XL GOLD (MISCELLANEOUS) ×2 IMPLANT
CLIP LIGATING HEMO O LOK GREEN (MISCELLANEOUS) ×9 IMPLANT
CLOSURE WOUND 1/4X4 (GAUZE/BANDAGES/DRESSINGS)
COVER SURGICAL LIGHT HANDLE (MISCELLANEOUS) ×3 IMPLANT
CUTTER FLEX LINEAR 45M (STAPLE) ×3 IMPLANT
DERMABOND ADVANCED (GAUZE/BANDAGES/DRESSINGS) ×2
DERMABOND ADVANCED .7 DNX12 (GAUZE/BANDAGES/DRESSINGS) ×1 IMPLANT
DRAIN CHANNEL 10F 3/8 F FF (DRAIN) IMPLANT
DRAPE INCISE IOBAN 66X45 STRL (DRAPES) ×3 IMPLANT
DRAPE LAPAROSCOPIC ABDOMINAL (DRAPES) ×3 IMPLANT
ELECT REM PT RETURN 9FT ADLT (ELECTROSURGICAL) ×3
ELECTRODE REM PT RTRN 9FT ADLT (ELECTROSURGICAL) ×1 IMPLANT
EVACUATOR SILICONE 100CC (DRAIN) IMPLANT
FLOSEAL 10ML (HEMOSTASIS) IMPLANT
GLOVE BIOGEL M STRL SZ7.5 (GLOVE) ×3 IMPLANT
GOWN STRL REUS W/TWL LRG LVL3 (GOWN DISPOSABLE) ×4 IMPLANT
HEMOSTAT SURGICEL 4X8 (HEMOSTASIS) ×2 IMPLANT
KIT BASIN OR (CUSTOM PROCEDURE TRAY) ×3 IMPLANT
MANIFOLD NEPTUNE II (INSTRUMENTS) ×3 IMPLANT
NDL SPNL 22GX2.5 QUINCKE BK (NEEDLE) ×1 IMPLANT
NDL SPNL 22GX3.5 QUINCKE BK (NEEDLE) IMPLANT
NEEDLE SPNL 22GX2.5 QUINCKE BK (NEEDLE) IMPLANT
NEEDLE SPNL 22GX3.5 QUINCKE BK (NEEDLE) ×3 IMPLANT
PENCIL BUTTON HOLSTER BLD 10FT (ELECTRODE) ×3 IMPLANT
POSITIONER SURGICAL ARM (MISCELLANEOUS) ×6 IMPLANT
POUCH ENDO CATCH II 15MM (MISCELLANEOUS) ×3 IMPLANT
RELOAD 45 VASCULAR/THIN (ENDOMECHANICALS) ×9 IMPLANT
RELOAD STAPLE 45 2.5 WHT GRN (ENDOMECHANICALS) IMPLANT
RELOAD STAPLE 45 3.5 BLU ETS (ENDOMECHANICALS) IMPLANT
RELOAD STAPLE TA45 3.5 REG BLU (ENDOMECHANICALS) IMPLANT
RETRACTOR LAPSCP 12X46 CVD (ENDOMECHANICALS) IMPLANT
RTRCTR LAPSCP 12X46 CVD (ENDOMECHANICALS)
SCALPEL HARMONIC ACE (MISCELLANEOUS) ×3 IMPLANT
SCISSORS LAP 5X35 DISP (ENDOMECHANICALS) ×1 IMPLANT
SEALANT SURGICAL APPL DUAL CAN (MISCELLANEOUS) IMPLANT
SET IRRIG TUBING LAPAROSCOPIC (IRRIGATION / IRRIGATOR) ×3 IMPLANT
SLEEVE XCEL OPT CAN 5 100 (ENDOMECHANICALS) ×2 IMPLANT
SOLUTION ANTI FOG 6CC (MISCELLANEOUS) ×3 IMPLANT
SPONGE GAUZE 4X4 12PLY (GAUZE/BANDAGES/DRESSINGS) IMPLANT
SPONGE LAP 18X18 X RAY DECT (DISPOSABLE) ×3 IMPLANT
SPONGE LAP 4X18 X RAY DECT (DISPOSABLE) ×1 IMPLANT
SPONGE SURGIFOAM ABS GEL 100 (HEMOSTASIS) IMPLANT
STAPLER VISISTAT 35W (STAPLE) IMPLANT
STRIP CLOSURE SKIN 1/4X4 (GAUZE/BANDAGES/DRESSINGS) IMPLANT
SUT ETHILON 3 0 PS 1 (SUTURE) IMPLANT
SUT MNCRL AB 4-0 PS2 18 (SUTURE) ×6 IMPLANT
SUT PDS AB 1 CTX 36 (SUTURE) ×4 IMPLANT
SUT PDS AB 1 TP1 96 (SUTURE) ×1 IMPLANT
SUT VIC AB 2-0 CT1 27 (SUTURE)
SUT VIC AB 2-0 CT1 27XBRD (SUTURE) ×1 IMPLANT
SUT VICRYL 0 UR6 27IN ABS (SUTURE) ×4 IMPLANT
SYR 20CC LL (SYRINGE) ×1 IMPLANT
TAPE STRIPS DRAPE STRL (GAUZE/BANDAGES/DRESSINGS) ×1 IMPLANT
TOWEL OR 17X26 10 PK STRL BLUE (TOWEL DISPOSABLE) ×3 IMPLANT
TRAY FOLEY CATH 16FR SILVER (SET/KITS/TRAYS/PACK) ×3 IMPLANT
TRAY LAP CHOLE (CUSTOM PROCEDURE TRAY) ×3 IMPLANT
TROCAR BLADELESS OPT 5 100 (ENDOMECHANICALS) ×3 IMPLANT
TROCAR BLADELESS OPT 5 75 (ENDOMECHANICALS) ×2 IMPLANT
TROCAR ENDOPATH XCEL 12X100 BL (ENDOMECHANICALS) ×1 IMPLANT
TROCAR XCEL 12X100 BLDLESS (ENDOMECHANICALS) ×3 IMPLANT
TROCAR XCEL BLUNT TIP 100MML (ENDOMECHANICALS) ×2 IMPLANT
TUBING INSUFFLATION 10FT LAP (TUBING) ×3 IMPLANT
YANKAUER SUCT BULB TIP 10FT TU (MISCELLANEOUS) ×3 IMPLANT

## 2013-04-04 NOTE — Progress Notes (Signed)
PHARMACY BRIEF NOTE:  PROTONIX IV TO PO CONVERSION  This patient is receiving IV Protonix, which is in critically short supply.    Based on emergency conservation measures implemented by the Pharmacy and Therapeutics Committee chairman in consultation with gastroenterology, this patient now meets criteria for automatic switch to PO Protonix; the medication profile has been updated accordingly.  If there are questions regarding this change, please contact Pharmacy at [phone]  Thank you,  Doreene Eland, PharmD, BCPS.   Pager: [phone] 04/04/2013 .12:53 PM

## 2013-04-04 NOTE — Interval H&P Note (Signed)
History and Physical Interval Note:  04/04/2013 6:25 AM  [name]  has presented today for surgery, with the diagnosis of left renal mass  The various methods of treatment have been discussed with the patient and family. After consideration of risks, benefits and other options for treatment, the patient has consented to  Procedure(s): LAPAROSCOPIC LEFT RADICAL NEPHRECTOMY HIGH LIKELIHOOD OF CONVERSION TO OPEN (Left) as a surgical intervention .  The patient's history has been reviewed, patient examined, no change in status, stable for surgery.  I have reviewed the patient's chart and labs.  Questions were answered to the patient's satisfaction.     Louis Meckel W

## 2013-04-04 NOTE — Progress Notes (Signed)
04/04/13 1300  PT Visit Information  Last PT Received On 04/04/13  Reason Eval/Treat Not Completed Other (comment) (surgery today, see tomorrow)

## 2013-04-04 NOTE — Anesthesia Postprocedure Evaluation (Signed)
Anesthesia Post Note  Patient: [name]  Procedure(s) Performed: Procedure(s) (LRB): LAPAROSCOPIC LEFT RADICAL NEPHRECTOMY  (Left)  Anesthesia type: General  Patient location: PACU  Post pain: Pain level controlled  Post assessment: Post-op Vital signs reviewed  Last Vitals: BP 140/63  Pulse 92  Temp(Src) 36.6 C (Oral)  Resp 12  Ht 5' 9"  (1.753 m)  Wt 158 lb 4 oz (71.782 kg)  BMI 23.36 kg/m2  SpO2 95%  Post vital signs: Reviewed  Level of consciousness: sedated  Complications: No apparent anesthesia complications

## 2013-04-04 NOTE — Op Note (Signed)
Preoperative diagnosis:  1. Large central left renal  mass   Postoperative diagnosis:  1. As above   Procedure: 1. Left laparoscopic radical nephrectomy  Surgeon: [name], MD Assistant: Dr. Dutch Gray, MD  Anesthesia: General  Complications: None  Intraoperative findings:  We accessed the abdomen 5-6cm lateral to the umbilicus in order to avoid the ventral hernia mesh that was placed laparoscopically.  The entry was lateral to the mesh and allowed [place] to proceed with laparoscopic port placement.  Renal hilum was stapled - 2 arteries and 1 vein.  Adrenal gland was spared.  EBL: 100cc  Specimens: None  Indication: [name] is a 63 y.o. patient with left renal mass.  After reviewing the management options for treatment, he elected to proceed with the above surgical procedure(s). We have discussed the potential benefits and risks of the procedure, side effects of the proposed treatment, the likelihood of the patient achieving the goals of the procedure, and any potential problems that might occur during the procedure or recuperation. Informed consent has been obtained.  Description of procedure: The patient was consented in the preoperative holding area. He was then brought back to the operating room placed on the table in supine position. General anesthesia was then induced. The patient was then placed in the right lateral decubitus position, left side up. He was placed on the OR table such that the flex was patient's iliac crest. The patient's left arm was secured with a arm board at a 90 angle taking care to the patient's elbow and wrist. His right leg was flexed approximately 45 and his knee and ankle appropriately padded. Pillows were then stacked between the left and right leg and the left leg was kept straight. The legs were then secured with a strap to the OR table. The table was then flexed to open the CVA, and the beanbag was then deflated to hold the patient's  position. 4 inch tape was then used to secure the patient's hip and torso using towels to protect the patient from the tape. Once the patient had been correctly positioned in his left abdomen shaved, he was prepped and draped in the routine sterile fashion.  A site was selected approximately 5 cm lateral to the umbilicus for placement of the camera port. This was placed using a standard open Hassan technique which allowed entry into the peritoneal cavity under direct vision and without difficulty. A 12 mm Hassan cannula was placed and a pneumoperitoneum established. The camera was then used to inspect the abdomen and there was no evidence of any intra-abdominal injuries or other abnormalities. The remaining abdominal ports were then placed. One 5 mm trocar was placed subcostal margin in the left upper quadrant, the second 12 mm trocar was placed laterally and slightly inferior to the camera port so as to triangulate the kidney. A second 5 mm trocar was then placed the anterior axillary line and slightly inferior to the second 12 mm port. This port was used to retract the kidney up and medial. The white line of Toldt was incised allowing the colon to be mobilized medially and the plane between the mesocolon and the anterior layer of Gerota's fascia to be developed and the kidney exposed. Dissection was carried up to the upper pole of the kidney and the spleen was mobilized and allowed to fall away medially and superiorly. The ureter and gonadal vein were identified inferiorly and the ureter was lifted anteriorly off the psoas muscle. The gonadal vein  was then dissected out inferior to the lower pole and 2 clips were placed both down and one clip up and then ligated. Dissection proceeded superiorly along the gonadal vein until the renal vein was identified. The renal hilum was then carefully isolated with a combination of blunt and sharp dissectiong allowing the renal arteries and venous structures to be separated  and isolated.  The renal arteries were isolated and ligated with a 45 mm Flex ETS stapler. The renal vein was then isolated and also ligated and divided with a 45 mm Flex ETS stapler.  Gerota's fascia was intentionally entered superiorly and the space between the adrenal gland and the kidney was developed allowing the adrenal gland to be spared.  Our attention was then turned to the upper pole which was dissected off of the spleen and the splenic attachments. The pancreas and colon were noted to be well away from the structures.  Once the hilum and upper pole had been dissected free, dissection ensued from the inferior pole of the kidney. The ureter was transected placing 2 clips on the stay side and one on the specimen side. The lateral attachments of the kidney were then freed. The remaining of the posterior aspect of the attachments was then ligated using a Harmonic Scalpel. Once the kidney was freed from its attachments it was left medially of the renal bed and the vascular hilum was inspected and noted to be sufficiently hemostatic. There was slight bit of oozing from the adrenal gland which was cauterized and then Surgicel placed over top. 40cc of Exparel was then injected into the left anterior axillary line b/w the iliac crest and the twelfth rib under laparoscopic guidance. The layer between the tranversus abdominus and the internal oblique was targeted. The kidney/ureter specimen was then placed into a 15 mm Endocatch II retrieval bag, this was passed through the port site of the assistant port and left lower quadrant. The trochars were then removed under visual guidance to ensure no ongoing port site bleeding was occurring. The extraction incision was extended from the 12 mm left lower quadrant port. The external oblique and internal oblique muscles were spread as best as possible with as little muscle fibers ligated as possible in order to safely extracted the specimen. The internal oblique fascia of  was then closed with 0 Vicryl in a i running fashion. The external oblique fascia was closed with a 0 PDS. The camera port was then closed with 0 Vicryl, figure-of-eight was placed in the posterior fascia and a second figure-of-eight placed in the anterior rectus fascia. All incisions were injected with Exparel and reapproximated at the skin with 4-0 monocryl sutures. Dermabond was applied to the skin. The patient tolerated the procedure well and without complications and was transferred to the recovery unit in satisfactory condition.  Dr. Dutch Gray then I were present for the entire case, he acted as my Environmental consultant.  [name], M.D.

## 2013-04-04 NOTE — Transfer of Care (Signed)
Immediate Anesthesia Transfer of Care Note  Patient: [name]  Procedure(s) Performed: Procedure(s): LAPAROSCOPIC LEFT RADICAL NEPHRECTOMY  (Left)  Patient Location: PACU  Anesthesia Type:General  Level of Consciousness: awake, alert , oriented and patient cooperative  Airway & Oxygen Therapy: Patient Spontanous Breathing and Patient connected to face mask oxygen  Post-op Assessment: Report given to PACU RN, Post -op Vital signs reviewed and stable and Patient moving all extremities X 4  Post vital signs: stable  Complications: No apparent anesthesia complications

## 2013-04-04 NOTE — H&P (Signed)
Reason For Visit f/u for left renal mass   Active Problems Problems  1. Left renal mass (593.9) 2. Lung cancer (162.9) 3. 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 This was an incidental finding as part of his workup for his lung cancer. He has completed his chemo/radiation treatments for his lung cancer and his restaging CT showed satisfactory regression of his tumor. He is scheduled to meet with thoracic surgeon to evaluate for resection of the mass. He has had significant left sided abdominal/flank pain requiring pain medications. No hematuria. Good appetitie, regaining strength from lung Ca treatment, no significant weight loss.     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 He had a lap ventral hernia repair for 5cm midline hernia in 2009.   Past Medical History Problems  1. History of Crohn's disease (V12.70) 2. 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 History of Appendectomy 2. History of Hernia Repair 3. History of Surgery Testis  Current Meds 1. Astaxanthin 4 MG Oral Capsule;  Therapy: (Recorded:02Dec2014) to Recorded 2. Chantix 1 MG Oral Tablet;  Therapy: (Recorded:10Mar2015) to Recorded 3. Probiotic CAPS;  Therapy: (Recorded:02Dec2014) to Recorded 4. Resveratrol CAPS;  Therapy: (Recorded:02Dec2014) to Recorded 5. Turmeric Curcumin CAPS;  Therapy: (Recorded:02Dec2014) to Recorded 6. Vitamin D3 TABS;  Therapy: (Recorded:02Dec2014) to Recorded 7. Vitamin E TABS;  Therapy: (Recorded:02Dec2014) to Recorded 8. Vitamin K2 CAPS;  Therapy: (Recorded:02Dec2014) to Recorded 9. Xartemis XR 7.5-325 MG Oral Tablet Extended Release; TAKE 1 TABLET Every twelve  hours;  Therapy: [phone] to (Evaluate:25Mar2015); Last Rx:23Feb2015 Ordered  Family History Problems  1. Family history of asthma (V17.5) : Brother 2. Family history of malignant  neoplasm of breast (V16.3) : Sister 3. Family history of prostate cancer (X32.35) : Brother 4. Family history of testicular cancer (V16.43) : Brother 5. No pertinent family history : Mother  Social History Problems  1. Alcohol use 2. Caffeine use (V49.89) 3. Current every day smoker (305.1) 4. 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 Two children  Review of Systems No changes in pts bowel habits, neurological changes, or progressive lower urinary tract symptoms.    Vitals Vital Signs [Data Includes: Last 1 Day]  Recorded: [id number] 09:25AM  Height: 5 ft 9 in Weight: 157 lb  BMI Calculated: 23.18 BSA Calculated: 1.86 Blood Pressure: 112 / 71 Temperature: 98 F Heart Rate: 85  Physical Exam Constitutional: Well nourished and well developed . No acute distress.  ENT:. The ears and nose are normal in appearance.  Neck: The appearance of the neck is normal and no neck mass is present.  Pulmonary: No respiratory distress, normal respiratory rhythm and effort and clear bilateral breath sounds.  Cardiovascular: Heart rate and rhythm are normal . The arterial pulses are normal. No peripheral edema.  Abdomen: The abdomen is flat. The abdomen is soft and nontender. Right CVA tenderness. No hernias are palpable.  Lymphatics: The femoral and inguinal nodes are not enlarged or tender.  Skin: Normal skin turgor, no visible rash and no visible skin lesions.  Neuro/Psych:. Mood and affect are appropriate.    Results/Data Urine [Data Includes: Last 1 Day]   [id number]  COLOR YELLOW   APPEARANCE CLEAR   SPECIFIC GRAVITY 1.025   pH 5.5   GLUCOSE NEG mg/dL  BILIRUBIN NEG   KETONE NEG mg/dL  BLOOD NEG   PROTEIN NEG mg/dL  UROBILINOGEN 0.2 mg/dL  NITRITE  NEG   LEUKOCYTE ESTERASE NEG    CT abd/pelv, renal mass protocol: I have independently reviewed the images   left renal mass may have enlarged slightly compared to the MRI in November. there is no renal vein involvement and  no evidence of metastatic disease.   Assessment 6cm centrally located left lower pole renal mass, appears to be stable over the last 3.5 months.   Plan Health Maintenance  1. UA With REFLEX; [Do Not Release]; Status:Complete;   Done: [id number] 09:04AM Left renal mass  2. CT-ABD/PELVIS W/W/O CONTRAST; Status:In Progress - Specimen/Data Collected;    Done: [id number] 12:00AM  Discussion/Summary will discuss with oncology and CT surgery priority of renal mass and lung mass. We again discussed his surgery, which would be an open left nephrectomy. I again went over the risks and benefits of the operation. I went over the expected recovery, including ~5-7 day hospitalization. I will contact patient letting him know the timing of the operation.   Addendum: No changes to above.  Plan to take a look with the laparoscope and see if the procedure can be done laparoscopically.  Otherwise plan to perform a thoracoabdominal nephrectomy.  Patient understands the circumstances and the risk/benefits of the procedure.

## 2013-04-05 ENCOUNTER — Other Ambulatory Visit: Payer: Commercial Managed Care - PPO

## 2013-04-05 ENCOUNTER — Ambulatory Visit: Payer: Commercial Managed Care - PPO | Admitting: Internal Medicine

## 2013-04-05 ENCOUNTER — Encounter (HOSPITAL_COMMUNITY): Payer: Self-pay | Admitting: Urology

## 2013-04-05 LAB — TYPE AND SCREEN
ABO/RH(D): O NEG
Antibody Screen: NEGATIVE

## 2013-04-05 LAB — BASIC METABOLIC PANEL
BUN: 10 mg/dL (ref 6–23)
CALCIUM: 9 mg/dL (ref 8.4–10.5)
CO2: 28 meq/L (ref 19–32)
CREATININE: 1.13 mg/dL (ref 0.50–1.35)
Chloride: 99 mEq/L (ref 96–112)
GFR calc Af Amer: 78 mL/min — ABNORMAL LOW (ref >90)
GFR calc non Af Amer: 67 mL/min — ABNORMAL LOW (ref >90)
Glucose, Bld: 137 mg/dL — ABNORMAL HIGH (ref 70–99)
Potassium: 4.4 mEq/L (ref 3.7–5.3)
Sodium: 135 mEq/L — ABNORMAL LOW (ref 137–147)

## 2013-04-05 LAB — CBC
HCT: 32.3 % — ABNORMAL LOW (ref 39.0–52.0)
Hemoglobin: 10.2 g/dL — ABNORMAL LOW (ref 13.0–17.0)
MCH: 29.9 pg (ref 26.0–34.0)
MCHC: 31.6 g/dL (ref 30.0–36.0)
MCV: 94.7 fL (ref 78.0–100.0)
PLATELETS: 302 10*3/uL (ref 150–400)
RBC: 3.41 MIL/uL — ABNORMAL LOW (ref 4.22–5.81)
RDW: 14.2 % (ref 11.5–15.5)
WBC: 11.6 10*3/uL — AB (ref 4.0–10.5)

## 2013-04-05 MED ORDER — HYDROMORPHONE HCL 2 MG PO TABS: 6.0000 mg | ORAL_TABLET | ORAL | Status: DC | PRN

## 2013-04-05 MED ORDER — HYDROMORPHONE HCL 2 MG PO TABS
6.0000 mg | ORAL_TABLET | ORAL | Status: DC | PRN
  Administered 2013-04-05: 6 mg via ORAL
  Filled 2013-04-05: qty 3

## 2013-04-05 NOTE — Evaluation (Signed)
Physical Therapy Evaluation Patient Details Name: [name] MRN: [id number] DOB: 12-Jun-1950 Today's Date: 04/05/2013   History of Present Illness  7M returns today for follow-up for left renal mass. This was an incidental finding as part of his workup for his lung cancer. He has completed his chemo/radiation treatments for his lung cancer and his restaging CT showed satisfactory regression of his tumor. He is scheduled to meet with thoracic surgeon to evaluate for resection of the mass. He has had significant left sided abdominal/flank pain requiring pain medications. No hematuria. Good appetitie, regaining strength from lung Ca treatment, no significant weight loss.   Clinical Impression  Pt feels he is fine, at his baseline; no f/u PT recommended    Follow Up Recommendations No PT follow up    Equipment Recommendations       Recommendations for Other Services       Precautions / Restrictions Precautions Precautions: None Restrictions Weight Bearing Restrictions: No      Mobility  Bed Mobility Overal bed mobility: Independent                Transfers Overall transfer level: Independent                  Ambulation/Gait Ambulation/Gait assistance: Independent              Stairs Stairs: Yes Stairs assistance: Modified independent (Device/Increase time) Stair Management: One rail Right Number of Stairs: 10 General stair comments: no LOB, step over step  Wheelchair Mobility    Modified Rankin (Stroke Patients Only)       Balance                                     Pertinent Vitals/Pain Min pain at surgical site    Home Living Family/patient expects to be discharged to:: Private residence Living Arrangements: Spouse/significant other;Children Available Help at Discharge: Family Type of Home: House Home Access: Stairs to enter   Technical brewer of Steps: 5 Home Layout: Laundry or work area in basement;One  level Home Equipment: None      Prior Function                 Hand Dominance        Extremity/Trunk Assessment   Upper Extremity Assessment: Overall WFL for tasks assessed           Lower Extremity Assessment: Overall WFL for tasks assessed         Communication   Communication: No difficulties  Cognition Arousal/Alertness: Awake/alert Behavior During Therapy: WFL for tasks assessed/performed Overall Cognitive Status: Within Functional Limits for tasks assessed                      General Comments      Exercises        Assessment/Plan    PT Assessment Patent does not need any further PT services  PT Diagnosis     PT Problem List    PT Treatment Interventions     PT Goals (Current goals can be found in the Care Plan section)      Frequency     Barriers to discharge        End of Session   Activity Tolerance: Patient tolerated treatment well Patient left: in chair;with call bell/phone within reach         Time: 1013-1024 PT Time Calculation (  min): 11 min   Charges:   PT Evaluation $Initial PT Evaluation Tier I: 1 Procedure PT Treatments $Gait Training: 8-22 mins   PT G Codes:          Caron Tardif 2013-04-23, 12:42 PM

## 2013-04-05 NOTE — Discharge Instructions (Signed)
·   Activity:  You are encouraged to ambulate frequently (about every hour during waking hours) to help prevent blood clots from forming in your legs or lungs.  However, you should not engage in any heavy lifting (> 10-15 lbs), strenuous activity, or straining.   Diet: You should advance your diet as instructed by your physician.  It will be normal to have some bloating, nausea, and abdominal discomfort intermittently.   Prescriptions:  You will be provided a prescription for pain medication to take as needed.  If your pain is not severe enough to require the prescription pain medication, you may take extra strength Tylenol instead which will have less side effects.  You should also take a prescribed stool softener to avoid straining with bowel movements as the prescription pain medication may constipate you.   Incisions: You may remove your dressing bandages 48 hours after surgery if not removed in the hospital.  You will either have some small staples or special tissue glue at each of the incision sites. Once the bandages are removed (if present), the incisions may stay open to air.  You may start showering (but not soaking or bathing in water) the 2nd day after surgery and the incisions simply need to be patted dry after the shower.  No additional care is needed.   What to call us about: You should call the office [phone]) if you develop fever > 101 or develop persistent vomiting. Activity:  You are encouraged to ambulate frequently (about every hour during waking hours) to help prevent blood clots from forming in your legs or lungs.  However, you should not engage in any heavy lifting (> 10-15 lbs), strenuous activity, or straining.

## 2013-04-05 NOTE — Progress Notes (Signed)
OT Cancellation Note  Patient Details Name: [name] MRN: [id number] DOB: [birth year]   Cancelled Treatment:    Reason Eval/Treat Not Completed: OT screened, no needs identified, will sign off  [facility], OTR/L  [phone] 04/05/2013 04/05/2013, 10:25 AM

## 2013-04-05 NOTE — Discharge Summary (Addendum)
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  Secondary diagnoses:  Patient Active Problem List   Diagnosis Date Noted  . COPD with chronic bronchitis stage B. 12/03/2012  . Renal mass, left 11/27/2012  . Tobacco use disorder 11/27/2012  . 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 Briefly, [name] is a 63 y.o. year old patient with large central left renal mass.  He presented for left radical nephrectomy.  Hospital Course: Patient tolerated the procedure well.  He was then transferred to the floor after an uneventful PACU stay.  His hospital course was uncomplicated.  On POD#1  he had met discharge criteria: was eating a regular diet, was up and ambulating independently,  pain was well controlled, was voiding without a catheter, and was ready to for discharge.   Laboratory values:   Recent Labs  04/04/13 1207 04/05/13 0348  WBC 12.7* 11.6*  HGB 11.2* 10.2*  HCT 34.4* 32.3*    Recent Labs  04/04/13 1207 04/05/13 0348  NA 137 135*  K 4.1 4.4  CL 101 99  CO2 26 28  GLUCOSE 143* 137*  BUN 10 10  CREATININE 0.96 1.13  CALCIUM 8.8 9.0   No results found for this basename: LABPT, INR,  in the last 72 hours No results found for this basename: LABURIN,  in the last 72 hours Results for orders placed during the hospital encounter of 03/30/13  URINE CULTURE     Status: None   Collection Time    03/30/13  3:04 PM      Result Value Ref Range Status   Specimen Description URINE, CLEAN CATCH   Final   Special Requests NONE   Final   Culture  Setup Time     Final   Value: 03/30/2013 19:45     Performed at [place]     Final   Value: NO GROWTH     Performed at [facility]   Culture      Final   Value: NO GROWTH     Performed at [facility]   Report Status 03/31/2013 FINAL   Final    Disposition: Home  Discharge instruction: The patient was instructed to be ambulatory but told to refrain from heavy lifting, strenuous activity, or driving.   Discharge medications:    Medication List    STOP taking these medications       oxycodone 5 MG capsule  Commonly known as:  OXY-IR      TAKE these medications       Astaxanthin 4 MG Caps  Take 1 tablet by mouth daily. With 325 mg of ala     HYDROmorphone 2 MG tablet  Commonly known as:  DILAUDID  Take 3-4 tablets (6-8 mg total) by mouth every 4 (four) hours as needed for moderate pain or severe pain.     KRILL OIL PO  Take 2 capsules by mouth daily.     PROBIOTIC DAILY PO  Take 1 capsule by mouth daily.     RESVERATROL PO  Take 1 tablet by mouth daily.     TURMERIC CURCUMIN PO  Take by mouth daily.     varenicline 0.5 MG X 11 & 1 MG X 42 tablet  Commonly known as:  CHANTIX STARTING MONTH PAK  Take one 0.5 mg tablet by mouth once daily for 3 days, then increase to one 0.5 mg tablet twice daily for 4 days, then increase to one 1 mg tablet twice daily.     VITAMIN D-3 PO  Take 8,000 Units by mouth daily.     vitamin E 200 UNIT capsule  Take by mouth daily.     VITAMIN K2 PO  Take 150 mcg by mouth daily.        Followup:      Follow-up Information   Follow up with [name], MD On 04/18/2013. (10:15am)    Specialty:  Urology   Contact information:   [facility]  [address] [place] [address] [phone]

## 2013-04-05 NOTE — Progress Notes (Signed)
Urology Inpatient Progress Report Left Radical Nephrectomy, 04/04/13  Intv/Subj: No acute events overnight. Patient is without complaint. Pain reasonably well controlled. Eating without nausea. Up and walking.  Objective: Vital: Filed Vitals:   04/04/13 1300 04/04/13 2027 04/05/13 0206 04/05/13 0537  BP:  143/62 129/82 134/69  Pulse:  81 76 67  Temp:  98 F (36.7 C) 98 F (36.7 C) 97.6 F (36.4 C)  TempSrc:  Oral Oral Oral  Resp:  18 18 18   Height: 5' 9"  (1.753 m)     Weight: 71.782 kg (158 lb 4 oz)     SpO2:  100% 100% 100%   I/Os: I/O last 3 completed shifts: In: 5357.9 [P.O.:600; I.V.:4657.9; IV Piggyback:100] Out: 2500 [Urine:2400; Blood:100]  Past Medical History  Diagnosis Date  . Crohn's disease   . Renal mass   . Renal mass, left   . Difficulty sleeping     occasionally  . Testicular cancer 2000    s/p resection, no recurrence  . Cancer of kidney     left  . Lung cancer    Current Facility-Administered Medications  Medication Dose Route Frequency Provider Last Rate Last Dose  . 0.9 %  sodium chloride infusion   Intravenous Continuous [name], MD 75 mL/hr at 04/05/13 0121    . acetaminophen (OFIRMEV) IV 1,000 mg  1,000 mg Intravenous 4 times per day [name], MD   1,000 mg at 04/04/13 1825  . docusate sodium (COLACE) capsule 100 mg  100 mg Oral BID [name], MD      . heparin injection 5,000 Units  5,000 Units Subcutaneous 3 times per day [name], MD   5,000 Units at 04/04/13 2222  . hydrALAZINE (APRESOLINE) injection 5 mg  5 mg Intravenous Q4H PRN [name], MD      . HYDROmorphone (DILAUDID) injection 0.5 mg  0.5 mg Intravenous Q2H PRN [name], MD   0.5 mg at 04/05/13 0015  . HYDROmorphone (DILAUDID) tablet 6-8 mg  6-8 mg Oral Q4H PRN [name], MD   6 mg at 04/05/13 9563  . ondansetron (ZOFRAN) injection 4 mg  4 mg Intravenous Q6H PRN [name], MD      . ondansetron [facility])  tablet 4 mg  4 mg Oral Q8H PRN [name], MD      . pantoprazole (PROTONIX) EC tablet 40 mg  40 mg Oral Daily [name], MD      . varenicline (CHANTIX) tablet 1 mg  1 mg Oral BID [name], MD      . zolpidem [facility]) tablet 5 mg  5 mg Oral QHS PRN,MR X 1 [name], MD        Physical Exam:  General: Patient is in no apparent distress Lungs: Normal respiratory effort, chest expands symmetrically. GI: The abdomen is soft and appropriately tender. Incisions are c/d/i Ext: lower extremities symmetric  Lab Results:  Recent Labs  04/04/13 1207 04/05/13 0348  WBC 12.7* 11.6*  HGB 11.2* 10.2*  HCT 34.4* 32.3*    Recent Labs  04/04/13 1207 04/05/13 0348  NA 137 135*  K 4.1 4.4  CL 101 99  CO2 26 28  GLUCOSE 143* 137*  BUN 10 10  CREATININE 0.96 1.13  CALCIUM 8.8 9.0   No results found for this basename: LABPT, INR,  in the last 72 hours No results found for this basename: LABURIN,  in the last 72 hours  Results for orders placed during the hospital encounter of 03/30/13  URINE CULTURE     Status: None   Collection Time    03/30/13  3:04 PM      Result Value Ref Range Status   Specimen Description URINE, CLEAN CATCH   Final   Special Requests NONE   Final   Culture  Setup Time     Final   Value: 03/30/2013 19:45     Performed at [place]     Final   Value: NO GROWTH     Performed at [facility]   Culture     Final   Value: NO GROWTH     Performed at [facility]   Report Status 03/31/2013 FINAL   Final    Studies/Results: none  Assessment: 1 Day Post-Op s/p left lap radical nephrectomy, doing well.  Plan: Continue with IV APAP x 24 hrs total.  Oral pain meds thereafter. D/c IVF D/c Foley PT/OT - given comorbidities and concern from patient's wife about returning home. SQH and PPI prophylaxis Perhaps will be d/c tomorrow.  [name] 04/05/2013, 7:57 AM

## 2013-04-06 ENCOUNTER — Encounter: Payer: Self-pay | Admitting: Internal Medicine

## 2013-04-06 NOTE — Progress Notes (Signed)
Put disability form on nurse's desk.

## 2013-04-19 ENCOUNTER — Encounter: Payer: Self-pay | Admitting: Internal Medicine

## 2013-04-19 NOTE — Progress Notes (Unsigned)
Faxed

## 2013-04-19 NOTE — Progress Notes (Signed)
Faxed disability form to [facility] @ [phone]

## 2013-05-03 ENCOUNTER — Encounter: Payer: Self-pay | Admitting: Internal Medicine

## 2013-05-03 ENCOUNTER — Other Ambulatory Visit: Payer: Commercial Managed Care - PPO

## 2013-05-03 ENCOUNTER — Ambulatory Visit (HOSPITAL_BASED_OUTPATIENT_CLINIC_OR_DEPARTMENT_OTHER): Payer: Commercial Managed Care - PPO | Admitting: Internal Medicine

## 2013-05-03 VITALS — BP 135/74 | HR 80 | Temp 97.7°F | Resp 18 | Ht 69.0 in | Wt 153.6 lb

## 2013-05-03 DIAGNOSIS — C649 Malignant neoplasm of unspecified kidney, except renal pelvis: Secondary | ICD-10-CM

## 2013-05-03 DIAGNOSIS — C3410 Malignant neoplasm of upper lobe, unspecified bronchus or lung: Secondary | ICD-10-CM

## 2013-05-03 NOTE — Progress Notes (Signed)
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 2) status post left laparoscopic radical nephrectomy under the care of Dr. Louis Meckel on 04/04/2013.  CURRENT THERAPY: Observation.  CHEMOTHERAPY INTENT: Curative  CURRENT # OF CHEMOTHERAPY CYCLES: 0  CURRENT ANTIEMETICS: Zofran, Compazine and Decadron  CURRENT SMOKING STATUS: Current smoker. Smoke cessation consult was done.  ORAL CHEMOTHERAPY AND CONSENT: None  CURRENT BISPHOSPHONATES USE: None  PAIN MANAGEMENT: 0/10  NARCOTICS INDUCED CONSTIPATION: None  LIVING WILL AND CODE STATUS: Full code  INTERVAL HISTORY: [name] 63 y.o. male returns to the clinic today for followup visit accompanied by his wife. He recently underwent left laparoscopic nephrectomy under the care of Dr. Louis Meckel on 04/04/2013. He is recovering very well from his recent surgery. He just came back from 2 weeks vacation at the beach and he enjoyed his time. The patient denied having any significant chest pain, shortness of breath or hemoptysis. He denied having any fever or chills. He denied having any significant weight loss or night sweats. He is here today for evaluation and discussion of his treatment options.  MEDICAL HISTORY: Past Medical History  Diagnosis Date  . Crohn's disease   . Renal mass   . Renal mass, left   . Difficulty sleeping     occasionally  .  Testicular cancer 2000    s/p resection, no recurrence  . Cancer of kidney     left  . Lung cancer     ALLERGIES:  has No Known Allergies.  MEDICATIONS:  Current Outpatient Prescriptions  Medication Sig Dispense Refill  . Cholecalciferol (VITAMIN D-3 PO) Take 8,000 Units by mouth daily.      Marland Kitchen HYDROmorphone (DILAUDID) 2 MG tablet Take 3-4 tablets (6-8 mg total) by mouth every 4 (four) hours as needed for moderate pain or severe pain.  50 tablet  0  . KRILL OIL PO Take 2 capsules by mouth daily.      . Probiotic Product (PROBIOTIC DAILY PO) Take 1 capsule by mouth daily.      . Astaxanthin 4 MG CAPS Take 1 tablet by mouth daily. With 325 mg of ala      . Menaquinone-7 (VITAMIN K2 PO) Take 150 mcg by mouth daily.      Marland Kitchen RESVERATROL PO Take 1 tablet by mouth daily.       . TURMERIC CURCUMIN PO Take by mouth daily.      . varenicline (CHANTIX STARTING MONTH PAK) 0.5 MG X 11 & 1 MG X 42 tablet Take one 0.5 mg tablet by mouth once daily for 3 days, then increase to one 0.5 mg tablet twice daily for 4 days, then increase to one 1 mg tablet twice daily.  53 tablet  0  . vitamin E 200 UNIT capsule Take by mouth daily.       No current facility-administered medications for this visit.    SURGICAL HISTORY:  Past Surgical History  Procedure Laterality  Date  . Testicular removal  2000  . Appendectomy    . Video bronchoscopy N/A 11/30/2012    Procedure: VIDEO BRONCHOSCOPY WITH FLUORO;  Surgeon: Elsie Stain, MD;  Location: [facility];  Service: Cardiopulmonary;  Laterality: N/A;  . Hernia repair      x 2  . Laparoscopic nephrectomy Left 04/04/2013    Procedure: LAPAROSCOPIC LEFT RADICAL NEPHRECTOMY ;  Surgeon: [name], MD;  Location: [facility];  Service: Urology;  Laterality: Left;    REVIEW OF SYSTEMS:  Constitutional: negative Eyes: negative Ears, nose, mouth, throat, and face: negative Respiratory: negative Cardiovascular: negative Gastrointestinal:  negative Genitourinary:negative Integument/breast: negative Hematologic/lymphatic: negative Musculoskeletal:negative Neurological: negative Behavioral/Psych: negative Endocrine: negative Allergic/Immunologic: negative   PHYSICAL EXAMINATION: General appearance: alert, cooperative, fatigued and no distress Head: Normocephalic, without obvious abnormality, atraumatic Neck: no adenopathy, no JVD, supple, symmetrical, trachea midline and thyroid not enlarged, symmetric, no tenderness/mass/nodules Lymph nodes: Cervical, supraclavicular, and axillary nodes normal. Resp: clear to auscultation bilaterally Back: symmetric, no curvature. ROM normal. No CVA tenderness. Cardio: regular rate and rhythm, S1, S2 normal, no murmur, click, rub or gallop GI: soft, non-tender; bowel sounds normal; no masses,  no organomegaly Extremities: extremities normal, atraumatic, no cyanosis or edema Neurologic: Alert and oriented X 3, normal strength and tone. Normal symmetric reflexes. Normal coordination and gait  ECOG PERFORMANCE STATUS: 1 - Symptomatic but completely ambulatory  Blood pressure 135/74, pulse 80, temperature 97.7 F (36.5 C), temperature source Oral, resp. rate 18, height 5' 9"  (1.753 m), weight 153 lb 9.6 oz (69.673 kg), SpO2 100.00%.  LABORATORY DATA: Lab Results  Component Value Date   WBC 11.6* 04/05/2013   HGB 10.2* 04/05/2013   HCT 32.3* 04/05/2013   MCV 94.7 04/05/2013   PLT 302 04/05/2013      Chemistry      Component Value Date/Time   NA 135* 04/05/2013 0348   NA 139 03/11/2013 0907   K 4.4 04/05/2013 0348   K 4.5 03/11/2013 0907   CL 99 04/05/2013 0348   CO2 28 04/05/2013 0348   CO2 28 03/11/2013 0907   BUN 10 04/05/2013 0348   BUN 10.5 03/11/2013 0907   CREATININE 1.13 04/05/2013 0348   CREATININE 0.9 03/11/2013 0907      Component Value Date/Time   CALCIUM 9.0 04/05/2013 0348   CALCIUM 9.7 03/11/2013 0907   ALKPHOS 106 03/30/2013 1350   ALKPHOS 86 03/11/2013 0907   AST 9 03/30/2013 1350    AST 10 03/11/2013 0907   ALT 6 03/30/2013 1350   ALT 6 03/11/2013 0907   BILITOT 0.3 03/30/2013 1350   BILITOT 0.31 03/11/2013 0907       RADIOGRAPHIC STUDIES:  ASSESSMENT AND PLAN: This is a very pleasant 63 years old white male recently diagnosed with a stage IIIA non-small cell lung cancer currently undergoing concurrent chemoradiation with weekly carboplatin and paclitaxel status post 7 cycles.  The patient tolerated the previous course of concurrent chemoradiation fairly well with no significant adverse effects He recently underwent laparoscopic left nephrectomy and tolerated this procedure fairly well. I had a lengthy discussion with the patient and his wife today about his current disease status and treatment options. I gave the patient the option of continuous observation and monitoring of his disease with repeat CT scan of the chest versus proceeding with a course of consolidation chemotherapy with carboplatin and paclitaxel. I discussed with the patient and his wife the adverse effect of the chemotherapy. I have a lengthy discussion with  the 2 options, the patient and his wife decided to continue on observation. I will see him back for followup visit in 3 months with repeat CT scan of the chest without contrast. He was advised to call immediately if he has any concerning symptoms in the interval. The patient voices understanding of current disease status and treatment options and is in agreement with the current care plan. All questions were answered. The patient knows to call the clinic with any problems, questions or concerns. We can certainly see the patient much sooner if necessary. I spent 15 minutes of face-to-face counseling with the patient and his wife to the total visit time 25 minutes.  Disclaimer: This note was dictated with voice recognition software. Similar sounding words can inadvertently be transcribed and may not be corrected upon review.

## 2013-05-04 ENCOUNTER — Telehealth: Payer: Self-pay | Admitting: Internal Medicine

## 2013-05-04 NOTE — Telephone Encounter (Signed)
s.w. pt and advised on June and July appt...pt ok and aware

## 2013-05-06 ENCOUNTER — Telehealth: Payer: Self-pay | Admitting: *Deleted

## 2013-05-06 NOTE — Telephone Encounter (Signed)
On 05-06-13 fax medical records to [facility] it was consult note, sim & tx planning note, end of tx note, follow up note

## 2013-07-04 ENCOUNTER — Ambulatory Visit (HOSPITAL_COMMUNITY)
Admission: RE | Admit: 2013-07-04 | Discharge: 2013-07-04 | Disposition: A | Discharge status: Home / Self Care | Payer: Commercial Managed Care - PPO | Source: Ambulatory Visit | Attending: Internal Medicine | Admitting: Internal Medicine

## 2013-07-04 ENCOUNTER — Other Ambulatory Visit (HOSPITAL_BASED_OUTPATIENT_CLINIC_OR_DEPARTMENT_OTHER): Payer: Commercial Managed Care - PPO

## 2013-07-04 DIAGNOSIS — C3410 Malignant neoplasm of upper lobe, unspecified bronchus or lung: Secondary | ICD-10-CM | POA: Insufficient documentation

## 2013-07-04 LAB — COMPREHENSIVE METABOLIC PANEL (CC13)
ALBUMIN: 3.1 g/dL — AB (ref 3.5–5.0)
ALT: 8 U/L (ref 0–55)
AST: 8 U/L (ref 5–34)
Alkaline Phosphatase: 109 U/L (ref 40–150)
Anion Gap: 9 mEq/L (ref 3–11)
BUN: 13.9 mg/dL (ref 7.0–26.0)
CALCIUM: 10.2 mg/dL (ref 8.4–10.4)
CHLORIDE: 101 meq/L (ref 98–109)
CO2: 28 mEq/L (ref 22–29)
Creatinine: 1.2 mg/dL (ref 0.7–1.3)
Glucose: 102 mg/dl (ref 70–140)
POTASSIUM: 4.7 meq/L (ref 3.5–5.1)
SODIUM: 138 meq/L (ref 136–145)
Total Bilirubin: 0.43 mg/dL (ref 0.20–1.20)
Total Protein: 7.4 g/dL (ref 6.4–8.3)

## 2013-07-04 LAB — CBC WITH DIFFERENTIAL/PLATELET
BASO%: 0.7 % (ref 0.0–2.0)
Basophils Absolute: 0.1 10*3/uL (ref 0.0–0.1)
EOS%: 1.4 % (ref 0.0–7.0)
Eosinophils Absolute: 0.1 10*3/uL (ref 0.0–0.5)
HCT: 40.8 % (ref 38.4–49.9)
HEMOGLOBIN: 13.3 g/dL (ref 13.0–17.1)
LYMPH%: 8 % — ABNORMAL LOW (ref 14.0–49.0)
MCH: 28.2 pg (ref 27.2–33.4)
MCHC: 32.5 g/dL (ref 32.0–36.0)
MCV: 86.8 fL (ref 79.3–98.0)
MONO#: 0.5 10*3/uL (ref 0.1–0.9)
MONO%: 5.6 % (ref 0.0–14.0)
NEUT#: 8.3 10*3/uL — ABNORMAL HIGH (ref 1.5–6.5)
NEUT%: 84.3 % — ABNORMAL HIGH (ref 39.0–75.0)
Platelets: 462 10*3/uL — ABNORMAL HIGH (ref 140–400)
RBC: 4.7 10*6/uL (ref 4.20–5.82)
RDW: 15.3 % — AB (ref 11.0–14.6)
WBC: 9.9 10*3/uL (ref 4.0–10.3)
lymph#: 0.8 10*3/uL — ABNORMAL LOW (ref 0.9–3.3)

## 2013-07-11 ENCOUNTER — Telehealth: Payer: Self-pay | Admitting: Internal Medicine

## 2013-07-11 ENCOUNTER — Encounter: Payer: Self-pay | Admitting: Internal Medicine

## 2013-07-11 ENCOUNTER — Ambulatory Visit (HOSPITAL_BASED_OUTPATIENT_CLINIC_OR_DEPARTMENT_OTHER): Payer: Commercial Managed Care - PPO | Admitting: Internal Medicine

## 2013-07-11 VITALS — BP 110/75 | HR 91 | Temp 97.8°F | Resp 18 | Ht 69.0 in | Wt 152.4 lb

## 2013-07-11 DIAGNOSIS — C3410 Malignant neoplasm of upper lobe, unspecified bronchus or lung: Secondary | ICD-10-CM

## 2013-07-11 DIAGNOSIS — F17200 Nicotine dependence, unspecified, uncomplicated: Secondary | ICD-10-CM

## 2013-07-11 DIAGNOSIS — C649 Malignant neoplasm of unspecified kidney, except renal pelvis: Secondary | ICD-10-CM

## 2013-07-11 NOTE — Telephone Encounter (Signed)
Gave pt appt for lab and Md for october 2015

## 2013-07-11 NOTE — Patient Instructions (Signed)
You Can Quit Smoking If you are ready to quit smoking or are thinking about it, congratulations! You have chosen to help yourself be healthier and live longer! There are lots of different ways to quit smoking. Nicotine gum, nicotine patches, a nicotine inhaler, or nicotine nasal spray can help with physical craving. Hypnosis, support groups, and medicines help break the habit of smoking. TIPS TO GET OFF AND STAY OFF CIGARETTES  Learn to predict your moods. Do not let a bad situation be your excuse to have a cigarette. Some situations in your life might tempt you to have a cigarette.  Ask friends and co-workers not to smoke around you.  Make your home smoke-free.  Never have "just one" cigarette. It leads to wanting another and another. Remind yourself of your decision to quit.  On a card, make a list of your reasons for not smoking. Read it at least the same number of times a day as you have a cigarette. Tell yourself everyday, "I do not want to smoke. I choose not to smoke."  Ask someone at home or work to help you with your plan to quit smoking.  Have something planned after you eat or have a cup of coffee. Take a walk or get other exercise to perk you up. This will help to keep you from overeating.  Try a relaxation exercise to calm you down and decrease your stress. Remember, you may be tense and nervous the first two weeks after you quit. This will pass.  Find new activities to keep your hands busy. Play with a pen, coin, or rubber band. Doodle or draw things on paper.  Brush your teeth right after eating. This will help cut down the craving for the taste of tobacco after meals. You can try mouthwash too.  Try gum, breath mints, or diet candy to keep something in your mouth. IF YOU SMOKE AND WANT TO QUIT:  Do not stock up on cigarettes. Never buy a carton. Wait until one pack is finished before you buy another.  Never carry cigarettes with you at work or at home.  Keep cigarettes  as far away from you as possible. Leave them with someone else.  Never carry matches or a lighter with you.  Ask yourself, "Do I need this cigarette or is this just a reflex?"  Bet with someone that you can quit. Put cigarette money in a piggy bank every morning. If you smoke, you give up the money. If you do not smoke, by the end of the week, you keep the money.  Keep trying. It takes 21 days to change a habit!  Talk to your doctor about using medicines to help you quit. These include nicotine replacement gum, lozenges, or skin patches. Document Released: 10/19/2008 Document Revised: 03/17/2011 Document Reviewed: 10/19/2008 Exit[employer] Patient Information 2015 [employer]. This information is not intended to replace advice given to you by your health care provider. Make sure you discuss any questions you have with your health care provider.  

## 2013-07-11 NOTE — Progress Notes (Signed)
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 2) Status post left laparoscopic radical nephrectomy under the care of Dr. Louis Meckel on 04/04/2013.  CURRENT THERAPY: Observation.  CHEMOTHERAPY INTENT: Curative  CURRENT # OF CHEMOTHERAPY CYCLES: 0  CURRENT ANTIEMETICS: Zofran, Compazine and Decadron  CURRENT SMOKING STATUS: Current smoker. Smoke cessation consult was done.  ORAL CHEMOTHERAPY AND CONSENT: None  CURRENT BISPHOSPHONATES USE: None  PAIN MANAGEMENT: 0/10  NARCOTICS INDUCED CONSTIPATION: None  LIVING WILL AND CODE STATUS: Full code  INTERVAL HISTORY: [name] 63 y.o. male returns to the clinic today for three-month followup visit accompanied by his wife. The patient has been observation for the last 6 months. He is feeling fine with no specific complaints. The patient denied having any significant chest pain, shortness of breath or hemoptysis. He denied having any fever or chills. He denied having any significant weight loss or night sweats. He has repeat CT scan of the chest performed recently and he is here today for evaluation and discussion of his treatment options.  MEDICAL HISTORY: Past Medical History  Diagnosis Date  . Crohn's disease   . Renal mass   . Renal mass, left   . Difficulty sleeping     occasionally  . Testicular cancer 2000    s/p resection, no recurrence  .  Cancer of kidney     left  . Lung cancer     ALLERGIES:  has No Known Allergies.  MEDICATIONS:  Current Outpatient Prescriptions  Medication Sig Dispense Refill  . Astaxanthin 4 MG CAPS Take 1 tablet by mouth daily. With 325 mg of ala      . Cholecalciferol (VITAMIN D-3 PO) Take 8,000 Units by mouth daily.      Marland Kitchen HYDROmorphone (DILAUDID) 2 MG tablet Take 3-4 tablets (6-8 mg total) by mouth every 4 (four) hours as needed for moderate pain or severe pain.  50 tablet  0  . KRILL OIL PO Take 2 capsules by mouth daily.      . Menaquinone-7 (VITAMIN K2 PO) Take 150 mcg by mouth daily.      . Probiotic Product (PROBIOTIC DAILY PO) Take 1 capsule by mouth daily.      Marland Kitchen RESVERATROL PO Take 1 tablet by mouth daily.       . TURMERIC CURCUMIN PO Take by mouth daily.      . varenicline (CHANTIX STARTING MONTH PAK) 0.5 MG X 11 & 1 MG X 42 tablet Take one 0.5 mg tablet by mouth once daily for 3 days, then increase to one 0.5 mg tablet twice daily for 4 days, then increase to one 1 mg tablet twice daily.  53 tablet  0  . vitamin E 200 UNIT capsule Take by mouth daily.       No current facility-administered medications for this visit.    SURGICAL HISTORY:  Past Surgical History  Procedure Laterality Date  . Testicular removal  2000  .  Appendectomy    . Video bronchoscopy N/A 11/30/2012    Procedure: VIDEO BRONCHOSCOPY WITH FLUORO;  Surgeon: Elsie Stain, MD;  Location: [facility];  Service: Cardiopulmonary;  Laterality: N/A;  . Hernia repair      x 2  . Laparoscopic nephrectomy Left 04/04/2013    Procedure: LAPAROSCOPIC LEFT RADICAL NEPHRECTOMY ;  Surgeon: [name], MD;  Location: [facility];  Service: Urology;  Laterality: Left;    REVIEW OF SYSTEMS:  Constitutional: negative Eyes: negative Ears, nose, mouth, throat, and face: negative Respiratory: negative Cardiovascular: negative Gastrointestinal: negative Genitourinary:negative Integument/breast:  negative Hematologic/lymphatic: negative Musculoskeletal:negative Neurological: negative Behavioral/Psych: negative Endocrine: negative Allergic/Immunologic: negative   PHYSICAL EXAMINATION: General appearance: alert, cooperative, fatigued and no distress Head: Normocephalic, without obvious abnormality, atraumatic Neck: no adenopathy, no JVD, supple, symmetrical, trachea midline and thyroid not enlarged, symmetric, no tenderness/mass/nodules Lymph nodes: Cervical, supraclavicular, and axillary nodes normal. Resp: clear to auscultation bilaterally Back: symmetric, no curvature. ROM normal. No CVA tenderness. Cardio: regular rate and rhythm, S1, S2 normal, no murmur, click, rub or gallop GI: soft, non-tender; bowel sounds normal; no masses,  no organomegaly Extremities: extremities normal, atraumatic, no cyanosis or edema Neurologic: Alert and oriented X 3, normal strength and tone. Normal symmetric reflexes. Normal coordination and gait  ECOG PERFORMANCE STATUS: 1 - Symptomatic but completely ambulatory  Blood pressure 110/75, pulse 91, temperature 97.8 F (36.6 C), temperature source Oral, resp. rate 18, height 5' 9"  (1.753 m), weight 152 lb 6.4 oz (69.128 kg), SpO2 98.00%.  LABORATORY DATA: Lab Results  Component Value Date   WBC 9.9 07/04/2013   HGB 13.3 07/04/2013   HCT 40.8 07/04/2013   MCV 86.8 07/04/2013   PLT 462* 07/04/2013      Chemistry      Component Value Date/Time   NA 138 07/04/2013 1009   NA 135* 04/05/2013 0348   K 4.7 07/04/2013 1009   K 4.4 04/05/2013 0348   CL 99 04/05/2013 0348   CO2 28 07/04/2013 1009   CO2 28 04/05/2013 0348   BUN 13.9 07/04/2013 1009   BUN 10 04/05/2013 0348   CREATININE 1.2 07/04/2013 1009   CREATININE 1.13 04/05/2013 0348      Component Value Date/Time   CALCIUM 10.2 07/04/2013 1009   CALCIUM 9.0 04/05/2013 0348   ALKPHOS 109 07/04/2013 1009   ALKPHOS 106 03/30/2013 1350   AST 8 07/04/2013 1009   AST 9 03/30/2013 1350   ALT 8 07/04/2013 1009    ALT 6 03/30/2013 1350   BILITOT 0.43 07/04/2013 1009   BILITOT 0.3 03/30/2013 1350       RADIOGRAPHIC STUDIES: Ct Chest Wo Contrast  07/04/2013   CLINICAL DATA:  Stage III squamous cell carcinoma of the LEFT upper lobe, past history LEFT testicular cancer post chemotherapy and surgery  EXAM: CT CHEST WITHOUT CONTRAST  TECHNIQUE: Multidetector CT imaging of the chest was performed following the standard protocol without IV contrast. Sagittal and coronal MPR images reconstructed from axial data set.  COMPARISON:  03/11/2013  FINDINGS: Scattered atherosclerotic calcifications aorta and coronary arteries.  Calcified RIGHT hilar and subcarinal adenopathy.  Post LEFT nephrectomy.  Remaining visualized portion of upper abdomen unremarkable.  Again identified abnormal soft tissue at the LEFT hilum either representing tumor and/or confluent adenopathy, abutting the LEFT upper lobe bronchus, more difficult to measure versus previous study due to lack of IV contrast, approximately 2.5 x 1.6 cm image 28 not significantly changed.  No additional thoracic adenopathy evident.  LEFT  upper lobe scarring versus atelectasis, potentially related to prior radiation therapy.  New peripheral opacity in LEFT upper lobe approximately 1.9 x 1.4 x 0.8 cm, favor atelectasis versus scarring though requires followup assessment to definitively exclude developing mass.  No acute infiltrate, pleural effusion, pneumothorax or additional mass/nodule.  Old LEFT rib fractures and osseous demineralization.  No definite osseous metastatic lesions.  IMPRESSION: LEFT hilar mass/adenopathy not significantly changed 03/11/2013.  New somewhat linear opacity in the peripheral LEFT upper lobe favor developing atelectasis or scarring though requiring assessment on follow-up exams to definitively exclude mass.  Old granulomatous disease.   Electronically Signed   By: Lavonia Dana M.D.   On: 07/04/2013 12:18   ASSESSMENT AND PLAN: This is a very pleasant  64 years old white male recently diagnosed with a stage IIIA non-small cell lung cancer currently undergoing concurrent chemoradiation with weekly carboplatin and paclitaxel status post 7 cycles.  The patient has been observation for the last 6 months with no significant evidence for disease progression. I discussed the scan results with the patient and his wife today. I recommended for him to continue on observation with repeat CT scan of the chest in 3 months. He was advised to call immediately if he has any concerning symptoms in the interval.  Disclaimer: This note was dictated with voice recognition software. Similar sounding words can inadvertently be transcribed and may not be corrected upon review.

## 2013-09-16 ENCOUNTER — Telehealth: Payer: Self-pay | Admitting: Internal Medicine

## 2013-09-16 NOTE — Telephone Encounter (Signed)
Lvm advising appt 10/6 moved to 10/5 due to md on call on 10/6. Also mailed new appt calendar.

## 2013-10-07 ENCOUNTER — Other Ambulatory Visit (HOSPITAL_BASED_OUTPATIENT_CLINIC_OR_DEPARTMENT_OTHER): Payer: Commercial Managed Care - PPO

## 2013-10-07 ENCOUNTER — Ambulatory Visit (HOSPITAL_COMMUNITY)
Admission: RE | Admit: 2013-10-07 | Discharge: 2013-10-07 | Disposition: A | Discharge status: Home / Self Care | Payer: Commercial Managed Care - PPO | Source: Ambulatory Visit | Attending: Internal Medicine | Admitting: Internal Medicine

## 2013-10-07 ENCOUNTER — Encounter (HOSPITAL_COMMUNITY): Payer: Self-pay

## 2013-10-07 DIAGNOSIS — Z8547 Personal history of malignant neoplasm of testis: Secondary | ICD-10-CM | POA: Insufficient documentation

## 2013-10-07 DIAGNOSIS — I2510 Atherosclerotic heart disease of native coronary artery without angina pectoris: Secondary | ICD-10-CM | POA: Insufficient documentation

## 2013-10-07 DIAGNOSIS — R590 Localized enlarged lymph nodes: Secondary | ICD-10-CM | POA: Diagnosis not present

## 2013-10-07 DIAGNOSIS — C642 Malignant neoplasm of left kidney, except renal pelvis: Secondary | ICD-10-CM | POA: Diagnosis not present

## 2013-10-07 DIAGNOSIS — Z9079 Acquired absence of other genital organ(s): Secondary | ICD-10-CM | POA: Insufficient documentation

## 2013-10-07 DIAGNOSIS — Z905 Acquired absence of kidney: Secondary | ICD-10-CM | POA: Diagnosis not present

## 2013-10-07 DIAGNOSIS — C3412 Malignant neoplasm of upper lobe, left bronchus or lung: Secondary | ICD-10-CM | POA: Diagnosis present

## 2013-10-07 DIAGNOSIS — C3410 Malignant neoplasm of upper lobe, unspecified bronchus or lung: Secondary | ICD-10-CM

## 2013-10-07 LAB — COMPREHENSIVE METABOLIC PANEL (CC13)
ALT: 7 U/L (ref 0–55)
AST: 9 U/L (ref 5–34)
Albumin: 2.9 g/dL — ABNORMAL LOW (ref 3.5–5.0)
Alkaline Phosphatase: 102 U/L (ref 40–150)
Anion Gap: 8 mEq/L (ref 3–11)
BILIRUBIN TOTAL: 0.3 mg/dL (ref 0.20–1.20)
BUN: 10.8 mg/dL (ref 7.0–26.0)
CO2: 29 mEq/L (ref 22–29)
Calcium: 9.7 mg/dL (ref 8.4–10.4)
Chloride: 101 mEq/L (ref 98–109)
Creatinine: 1.3 mg/dL (ref 0.7–1.3)
GLUCOSE: 94 mg/dL (ref 70–140)
Potassium: 4.1 mEq/L (ref 3.5–5.1)
Sodium: 138 mEq/L (ref 136–145)
Total Protein: 6.9 g/dL (ref 6.4–8.3)

## 2013-10-07 LAB — CBC WITH DIFFERENTIAL/PLATELET
BASO%: 0.1 % (ref 0.0–2.0)
BASOS ABS: 0 10*3/uL (ref 0.0–0.1)
EOS%: 3.6 % (ref 0.0–7.0)
Eosinophils Absolute: 0.2 10*3/uL (ref 0.0–0.5)
HCT: 41.4 % (ref 38.4–49.9)
HGB: 13.4 g/dL (ref 13.0–17.1)
LYMPH%: 11.1 % — AB (ref 14.0–49.0)
MCH: 28.5 pg (ref 27.2–33.4)
MCHC: 32.4 g/dL (ref 32.0–36.0)
MCV: 87.9 fL (ref 79.3–98.0)
MONO#: 0.5 10*3/uL (ref 0.1–0.9)
MONO%: 7.5 % (ref 0.0–14.0)
NEUT#: 5.2 10*3/uL (ref 1.5–6.5)
NEUT%: 77.7 % — ABNORMAL HIGH (ref 39.0–75.0)
Platelets: 372 10*3/uL (ref 140–400)
RBC: 4.71 10*6/uL (ref 4.20–5.82)
RDW: 14.9 % — AB (ref 11.0–14.6)
WBC: 6.7 10*3/uL (ref 4.0–10.3)
lymph#: 0.7 10*3/uL — ABNORMAL LOW (ref 0.9–3.3)
nRBC: 0 % (ref 0–0)

## 2013-10-07 MED ORDER — IOHEXOL 300 MG/ML  SOLN
80.0000 mL | Freq: Once | INTRAMUSCULAR | Status: AC | PRN
  Administered 2013-10-07: 80 mL via INTRAVENOUS

## 2013-10-10 ENCOUNTER — Encounter: Payer: Self-pay | Admitting: Internal Medicine

## 2013-10-10 ENCOUNTER — Ambulatory Visit (HOSPITAL_BASED_OUTPATIENT_CLINIC_OR_DEPARTMENT_OTHER): Payer: Commercial Managed Care - PPO | Admitting: Internal Medicine

## 2013-10-10 ENCOUNTER — Telehealth: Payer: Self-pay | Admitting: Internal Medicine

## 2013-10-10 VITALS — BP 113/73 | HR 88 | Temp 98.3°F | Resp 18 | Ht 69.0 in | Wt 154.3 lb

## 2013-10-10 DIAGNOSIS — C3412 Malignant neoplasm of upper lobe, left bronchus or lung: Secondary | ICD-10-CM

## 2013-10-10 DIAGNOSIS — C3492 Malignant neoplasm of unspecified part of left bronchus or lung: Secondary | ICD-10-CM

## 2013-10-10 NOTE — Progress Notes (Signed)
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 2) Status post left laparoscopic radical nephrectomy under the care of Dr. Louis Meckel on 04/04/2013.  CURRENT THERAPY: Observation.  CHEMOTHERAPY INTENT: Curative  CURRENT # OF CHEMOTHERAPY CYCLES: 0  CURRENT ANTIEMETICS: Zofran, Compazine and Decadron  CURRENT SMOKING STATUS: Current smoker. Smoke cessation consult was done.  ORAL CHEMOTHERAPY AND CONSENT: None  CURRENT BISPHOSPHONATES USE: None  PAIN MANAGEMENT: 0/10  NARCOTICS INDUCED CONSTIPATION: None  LIVING WILL AND CODE STATUS: Full code  INTERVAL HISTORY: [name] 63 y.o. male returns to the clinic today for three-month followup visit accompanied by his wife. The patient has been observation for the last 9 months. He is feeling fine with no specific complaints, except for her chest congestion and bronchitis started a weeks ago. The patient denied having any significant chest pain, shortness of breath or hemoptysis. He denied having any fever or chills. He denied having any significant weight loss or night sweats. He has repeat CT scan of the chest performed recently and he is here today for evaluation and discussion of his treatment options.  MEDICAL HISTORY: Past Medical History  Diagnosis Date  . Crohn's disease   . Renal mass   . Renal mass, left   . Difficulty sleeping    occasionally  . Testicular cancer 2000    s/p resection, no recurrence  . Cancer of kidney     left  . Lung cancer     ALLERGIES:  has No Known Allergies.  MEDICATIONS:  Current Outpatient Prescriptions  Medication Sig Dispense Refill  . Astaxanthin 4 MG CAPS Take 1 tablet by mouth daily. With 325 mg of ala      . Cholecalciferol (VITAMIN D-3 PO) Take 8,000 Units by mouth daily.      . Menaquinone-7 (VITAMIN K2 PO) Take 150 mcg by mouth daily.      Marland Kitchen oxyCODONE (ROXICODONE) 15 MG immediate release tablet Take 15 mg by mouth every 8 (eight) hours as needed for pain (1-2 tid prn pain).      . Probiotic Product (PROBIOTIC DAILY PO) Take 1 capsule by mouth daily.      . TURMERIC CURCUMIN PO Take by mouth daily.      . vitamin E 200 UNIT capsule Take by mouth daily.       No current facility-administered medications for this visit.    SURGICAL HISTORY:  Past Surgical History  Procedure Laterality Date  . Testicular removal  2000  . Appendectomy    . Video bronchoscopy N/A 11/30/2012    Procedure: VIDEO BRONCHOSCOPY WITH FLUORO;  Surgeon: Elsie Stain, MD;  Location: [facility];  Service: Cardiopulmonary;  Laterality: N/A;  . Hernia repair      x 2  . Laparoscopic nephrectomy Left 04/04/2013    Procedure: LAPAROSCOPIC LEFT RADICAL NEPHRECTOMY ;  Surgeon: [name], MD;  Location: [facility];  Service: Urology;  Laterality: Left;  REVIEW OF SYSTEMS:  Constitutional: negative Eyes: negative Ears, nose, mouth, throat, and face: negative Respiratory: negative Cardiovascular: negative Gastrointestinal: negative Genitourinary:negative Integument/breast: negative Hematologic/lymphatic: negative Musculoskeletal:negative Neurological: negative Behavioral/Psych: negative Endocrine: negative Allergic/Immunologic: negative   PHYSICAL EXAMINATION: General appearance: alert, cooperative, fatigued and no distress Head: Normocephalic, without obvious abnormality,  atraumatic Neck: no adenopathy, no JVD, supple, symmetrical, trachea midline and thyroid not enlarged, symmetric, no tenderness/mass/nodules Lymph nodes: Cervical, supraclavicular, and axillary nodes normal. Resp: clear to auscultation bilaterally Back: symmetric, no curvature. ROM normal. No CVA tenderness. Cardio: regular rate and rhythm, S1, S2 normal, no murmur, click, rub or gallop GI: soft, non-tender; bowel sounds normal; no masses,  no organomegaly Extremities: extremities normal, atraumatic, no cyanosis or edema Neurologic: Alert and oriented X 3, normal strength and tone. Normal symmetric reflexes. Normal coordination and gait  ECOG PERFORMANCE STATUS: 1 - Symptomatic but completely ambulatory  Blood pressure 113/73, pulse 88, temperature 98.3 F (36.8 C), temperature source Oral, resp. rate 18, height 5' 9"  (1.753 m), weight 154 lb 4.8 oz (69.99 kg).  LABORATORY DATA: Lab Results  Component Value Date   WBC 6.7 10/07/2013   HGB 13.4 10/07/2013   HCT 41.4 10/07/2013   MCV 87.9 10/07/2013   PLT 372 10/07/2013      Chemistry      Component Value Date/Time   NA 138 10/07/2013 1344   NA 135* 04/05/2013 0348   K 4.1 10/07/2013 1344   K 4.4 04/05/2013 0348   CL 99 04/05/2013 0348   CO2 29 10/07/2013 1344   CO2 28 04/05/2013 0348   BUN 10.8 10/07/2013 1344   BUN 10 04/05/2013 0348   CREATININE 1.3 10/07/2013 1344   CREATININE 1.13 04/05/2013 0348      Component Value Date/Time   CALCIUM 9.7 10/07/2013 1344   CALCIUM 9.0 04/05/2013 0348   ALKPHOS 102 10/07/2013 1344   ALKPHOS 106 03/30/2013 1350   AST 9 10/07/2013 1344   AST 9 03/30/2013 1350   ALT 7 10/07/2013 1344   ALT 6 03/30/2013 1350   BILITOT 0.30 10/07/2013 1344   BILITOT 0.3 03/30/2013 1350       RADIOGRAPHIC STUDIES: Ct Chest W Contrast  10/07/2013   CLINICAL DATA:  Subsequent in caliber forearm stage IIIA squamous cell carcinoma of the left lung.  EXAM: CT CHEST WITH CONTRAST  TECHNIQUE: Multidetector CT imaging of the  chest was performed during intravenous contrast administration.  CONTRAST:  42m OMNIPAQUE IOHEXOL 300 MG/ML  SOLN  COMPARISON:  07/04/2013.  FINDINGS: Soft tissue / Mediastinum: There is no axillary lymphadenopathy. No mediastinal or right hilar lymphadenopathy. There is been interval decrease in the abnormal soft tissue in the left hilum and no measurable left hilar mass lesion is evident on soft tissue windows. The Calcified nodal tissue is seen in the subcarinal station and right hilum. Heart size is normal. No pericardial effusion. Coronary artery calcification is noted.  Lungs / Pleura: Left upper lobe volume loss again noted. The architectural distortion in scarring in the central left upper lobe shows slightly less associated soft tissue component on the current study. The more peripheral 1.4 x 1.9 cm nodular lesion measured on previous study has decreased substantially in the interval. There really is not a residual measurable nodule at this time although a nodular area of ground-glass attenuation in the level of the previous nodule measures 1.3 x 1.3 cm today. Just posterior to this region is a 6 mm subpleural nodule which is stable in the interval. Left  lower lobe is clear. No parenchymal nodule or mass in the right lung.  Bones: Bones are diffusely demineralized without a worrisome lytic or sclerotic osseous abnormality.  Upper Abdomen: Visualized portions of the liver and spleen are unremarkable. Upper right kidney and right adrenal gland are normal. Stable thickening of the left adrenal gland noted.  IMPRESSION: Clear interval response to therapy with decrease in size of the left hilar mass/adenopathy.  The peripheral left upper lobe nodular area of probable atelectasis or evolving scar has decreased in the interval and has an almost strictly linear configuration on sagittal reformations today. Continued attention to this region is recommended.   Electronically Signed   By: Misty Stanley M.D.   On:  10/07/2013 15:29   ASSESSMENT AND PLAN: This is a very pleasant 63 years old white male recently diagnosed with a stage IIIA non-small cell lung cancer currently undergoing concurrent chemoradiation with weekly carboplatin and paclitaxel status post 7 cycles.  The patient has been observation for the last 9 months with no significant evidence for disease progression.  I discussed the scan results with the patient and his wife today. I recommended for him to continue on observation with repeat CT scan of the chest in 4 months. He was advised to call immediately if he has any concerning symptoms in the interval.  Disclaimer: This note was dictated with voice recognition software. Similar sounding words can inadvertently be transcribed and may not be corrected upon review.

## 2013-10-10 NOTE — Telephone Encounter (Signed)
Pt confirmed labs/ov per 10/05 POF, gave pt AVS..... KJ

## 2013-10-11 ENCOUNTER — Ambulatory Visit: Payer: Commercial Managed Care - PPO | Admitting: Internal Medicine

## 2014-02-07 ENCOUNTER — Encounter (HOSPITAL_COMMUNITY): Payer: Self-pay

## 2014-02-07 ENCOUNTER — Ambulatory Visit (HOSPITAL_COMMUNITY)
Admission: RE | Admit: 2014-02-07 | Discharge: 2014-02-07 | Disposition: A | Discharge status: Home / Self Care | Payer: Commercial Managed Care - PPO | Source: Ambulatory Visit | Attending: Internal Medicine | Admitting: Internal Medicine

## 2014-02-07 ENCOUNTER — Other Ambulatory Visit (HOSPITAL_BASED_OUTPATIENT_CLINIC_OR_DEPARTMENT_OTHER): Payer: Commercial Managed Care - PPO

## 2014-02-07 DIAGNOSIS — Z510 Encounter for antineoplastic radiation therapy: Secondary | ICD-10-CM | POA: Diagnosis not present

## 2014-02-07 DIAGNOSIS — C3492 Malignant neoplasm of unspecified part of left bronchus or lung: Secondary | ICD-10-CM | POA: Diagnosis not present

## 2014-02-07 DIAGNOSIS — Z8547 Personal history of malignant neoplasm of testis: Secondary | ICD-10-CM | POA: Diagnosis not present

## 2014-02-07 DIAGNOSIS — Z5111 Encounter for antineoplastic chemotherapy: Secondary | ICD-10-CM | POA: Diagnosis not present

## 2014-02-07 DIAGNOSIS — C3412 Malignant neoplasm of upper lobe, left bronchus or lung: Secondary | ICD-10-CM

## 2014-02-07 LAB — COMPREHENSIVE METABOLIC PANEL (CC13)
ALK PHOS: 108 U/L (ref 40–150)
ALT: 7 U/L (ref 0–55)
AST: 9 U/L (ref 5–34)
Albumin: 3 g/dL — ABNORMAL LOW (ref 3.5–5.0)
Anion Gap: 9 mEq/L (ref 3–11)
BUN: 8.8 mg/dL (ref 7.0–26.0)
CO2: 28 mEq/L (ref 22–29)
Calcium: 9 mg/dL (ref 8.4–10.4)
Chloride: 101 mEq/L (ref 98–109)
Creatinine: 1.1 mg/dL (ref 0.7–1.3)
EGFR: 69 mL/min/{1.73_m2} — AB (ref >90)
GLUCOSE: 91 mg/dL (ref 70–140)
POTASSIUM: 4.8 meq/L (ref 3.5–5.1)
Sodium: 138 mEq/L (ref 136–145)
Total Bilirubin: 0.34 mg/dL (ref 0.20–1.20)
Total Protein: 6.7 g/dL (ref 6.4–8.3)

## 2014-02-07 LAB — CBC WITH DIFFERENTIAL/PLATELET
BASO%: 0.7 % (ref 0.0–2.0)
BASOS ABS: 0.1 10*3/uL (ref 0.0–0.1)
EOS ABS: 0.1 10*3/uL (ref 0.0–0.5)
EOS%: 1.5 % (ref 0.0–7.0)
HEMATOCRIT: 41.9 % (ref 38.4–49.9)
HGB: 13.2 g/dL (ref 13.0–17.1)
LYMPH#: 0.9 10*3/uL (ref 0.9–3.3)
LYMPH%: 10.5 % — AB (ref 14.0–49.0)
MCH: 26.9 pg — ABNORMAL LOW (ref 27.2–33.4)
MCHC: 31.5 g/dL — AB (ref 32.0–36.0)
MCV: 85.4 fL (ref 79.3–98.0)
MONO#: 0.6 10*3/uL (ref 0.1–0.9)
MONO%: 6.8 % (ref 0.0–14.0)
NEUT#: 6.6 10*3/uL — ABNORMAL HIGH (ref 1.5–6.5)
NEUT%: 80.5 % — ABNORMAL HIGH (ref 39.0–75.0)
Platelets: 460 10*3/uL — ABNORMAL HIGH (ref 140–400)
RBC: 4.9 10*6/uL (ref 4.20–5.82)
RDW: 15.3 % — ABNORMAL HIGH (ref 11.0–14.6)
WBC: 8.1 10*3/uL (ref 4.0–10.3)

## 2014-02-07 MED ORDER — IOHEXOL 300 MG/ML  SOLN
80.0000 mL | Freq: Once | INTRAMUSCULAR | Status: AC | PRN
  Administered 2014-02-07: 80 mL via INTRAVENOUS

## 2014-02-14 ENCOUNTER — Ambulatory Visit (HOSPITAL_BASED_OUTPATIENT_CLINIC_OR_DEPARTMENT_OTHER): Payer: Commercial Managed Care - PPO | Admitting: Internal Medicine

## 2014-02-14 ENCOUNTER — Telehealth: Payer: Self-pay | Admitting: Internal Medicine

## 2014-02-14 ENCOUNTER — Encounter: Payer: Self-pay | Admitting: Internal Medicine

## 2014-02-14 VITALS — BP 112/67 | HR 87 | Temp 97.6°F | Resp 18 | Ht 69.0 in | Wt 152.0 lb

## 2014-02-14 DIAGNOSIS — C3492 Malignant neoplasm of unspecified part of left bronchus or lung: Secondary | ICD-10-CM

## 2014-02-14 DIAGNOSIS — C3412 Malignant neoplasm of upper lobe, left bronchus or lung: Secondary | ICD-10-CM

## 2014-02-14 DIAGNOSIS — Z720 Tobacco use: Secondary | ICD-10-CM

## 2014-02-14 NOTE — Progress Notes (Signed)
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 2) Status post left laparoscopic radical nephrectomy under the care of Dr. Louis Meckel on 04/04/2013.  CURRENT THERAPY: Observation.  CHEMOTHERAPY INTENT: Curative  CURRENT # OF CHEMOTHERAPY CYCLES: 0  CURRENT ANTIEMETICS: Zofran, Compazine and Decadron  CURRENT SMOKING STATUS: Current smoker. Smoke cessation consult was done.  ORAL CHEMOTHERAPY AND CONSENT: None  CURRENT BISPHOSPHONATES USE: None  PAIN MANAGEMENT: 0/10  NARCOTICS INDUCED CONSTIPATION: None  LIVING WILL AND CODE STATUS: Full code  INTERVAL HISTORY: [name] 63 y.o. male returns to the clinic today for four-month followup visit accompanied by his wife. He is feeling fine with no specific complaints. The patient denied having any significant chest pain, shortness of breath, cough or hemoptysis. He denied having any fever or chills. He denied having any significant weight loss or night sweats. Unfortunately he continues to smoke. He has repeat CT scan of the chest performed recently and he is here today for evaluation and discussion of his treatment options.  MEDICAL HISTORY: Past Medical History  Diagnosis Date  . Crohn's disease   . Renal mass   . Renal mass, left   . Difficulty sleeping     occasionally  . Testicular cancer 2000    s/p resection, no recurrence  . Cancer of  kidney     left  . Lung cancer     ALLERGIES:  has No Known Allergies.  MEDICATIONS:  Current Outpatient Prescriptions  Medication Sig Dispense Refill  . Astaxanthin 4 MG CAPS Take 1 tablet by mouth daily. With 325 mg of ala    . Cholecalciferol (VITAMIN D-3 PO) Take 10,000 Units by mouth daily. 8000-10000units    . Menaquinone-7 (VITAMIN K2 PO) Take 150 mcg by mouth daily.    Marland Kitchen oxyCODONE (ROXICODONE) 15 MG immediate release tablet Take 15 mg by mouth every 8 (eight) hours as needed for pain (1-2 tid prn pain).    . Probiotic Product (PROBIOTIC DAILY PO) Take 1 capsule by mouth daily.    . TURMERIC CURCUMIN PO Take by mouth daily.    . vitamin E 200 UNIT capsule Take by mouth daily.     No current facility-administered medications for this visit.    SURGICAL HISTORY:  Past Surgical History  Procedure Laterality Date  . Testicular removal  2000  . Appendectomy    . Video bronchoscopy N/A 11/30/2012    Procedure: VIDEO BRONCHOSCOPY WITH FLUORO;  Surgeon: Elsie Stain, MD;  Location: [facility];  Service: Cardiopulmonary;  Laterality: N/A;  . Hernia repair      x 2  . Laparoscopic nephrectomy Left 04/04/2013    Procedure: LAPAROSCOPIC LEFT RADICAL NEPHRECTOMY ;  Surgeon: [name], MD;  Location: [facility];  Service: Urology;  Laterality: Left;    REVIEW OF SYSTEMS:  Constitutional: negative Eyes: negative Ears, nose, mouth, throat, and face: negative Respiratory: negative Cardiovascular: negative Gastrointestinal: negative Genitourinary:negative Integument/breast: negative Hematologic/lymphatic: negative  Musculoskeletal:negative Neurological: negative Behavioral/Psych: negative Endocrine: negative Allergic/Immunologic: negative   PHYSICAL EXAMINATION: General appearance: alert, cooperative, fatigued and no distress Head: Normocephalic, without obvious abnormality, atraumatic Neck: no adenopathy, no JVD, supple, symmetrical, trachea midline and thyroid not  enlarged, symmetric, no tenderness/mass/nodules Lymph nodes: Cervical, supraclavicular, and axillary nodes normal. Resp: clear to auscultation bilaterally Back: symmetric, no curvature. ROM normal. No CVA tenderness. Cardio: regular rate and rhythm, S1, S2 normal, no murmur, click, rub or gallop GI: soft, non-tender; bowel sounds normal; no masses,  no organomegaly Extremities: extremities normal, atraumatic, no cyanosis or edema Neurologic: Alert and oriented X 3, normal strength and tone. Normal symmetric reflexes. Normal coordination and gait  ECOG PERFORMANCE STATUS: 1 - Symptomatic but completely ambulatory  Blood pressure 112/67, pulse 87, temperature 97.6 F (36.4 C), temperature source Oral, resp. rate 18, height 5' 9"  (1.753 m), weight 152 lb (68.947 kg).  LABORATORY DATA: Lab Results  Component Value Date   WBC 8.1 02/07/2014   HGB 13.2 02/07/2014   HCT 41.9 02/07/2014   MCV 85.4 02/07/2014   PLT 460* 02/07/2014      Chemistry      Component Value Date/Time   NA 138 02/07/2014 1314   NA 135* 04/05/2013 0348   K 4.8 02/07/2014 1314   K 4.4 04/05/2013 0348   CL 99 04/05/2013 0348   CO2 28 02/07/2014 1314   CO2 28 04/05/2013 0348   BUN 8.8 02/07/2014 1314   BUN 10 04/05/2013 0348   CREATININE 1.1 02/07/2014 1314   CREATININE 1.13 04/05/2013 0348      Component Value Date/Time   CALCIUM 9.0 02/07/2014 1314   CALCIUM 9.0 04/05/2013 0348   ALKPHOS 108 02/07/2014 1314   ALKPHOS 106 03/30/2013 1350   AST 9 02/07/2014 1314   AST 9 03/30/2013 1350   ALT 7 02/07/2014 1314   ALT 6 03/30/2013 1350   BILITOT 0.34 02/07/2014 1314   BILITOT 0.3 03/30/2013 1350       RADIOGRAPHIC STUDIES: Ct Chest W Contrast  02/07/2014   CLINICAL DATA:  Subsequent treatment strategy for lung cancer. Renal cell carcinoma diagnosed October 2014. Chemotherapy and radiation therapy complete. Remote history of testicular cancer diagnosed 2000.  EXAM: CT CHEST WITH CONTRAST  TECHNIQUE:  Multidetector CT imaging of the chest was performed during intravenous contrast administration.  CONTRAST:  62m OMNIPAQUE IOHEXOL 300 MG/ML  SOLN  COMPARISON:  CT 10/07/2013, 07/04/2013  FINDINGS: Mediastinum/Nodes: No axillary or supraclavicular lymphadenopathy. No mediastinal or hilar lymphadenopathy. There is calcified subcarinal lymph node. No central pulmonary embolism. No pericardial fluid. Esophagus is normal.  Lungs/Pleura: There is mild atelectasis and linear scarring in the left upper lobe. Central nodular component measures 7 mm not changed from prior. Peripheral nodular component measures 4 mm not changed from prior. Both of these small nodular components are on image 24 series 5. No new pulmonary nodules are present in the left lung. The right lung is clear.  Upper abdomen: Limited view of the liver, right kidney, pancreas are unremarkable. Normal adrenal glands.  Musculoskeletal: No aggressive osseous lesion.  IMPRESSION: 1. No evidence of lung cancer recurrence in the left upper lobe. 2. Stable atelectasis and nodular thickening at left upper lobe treatment site.   Electronically Signed   By: SSuzy BouchardM.D.   On: 02/07/2014 15:12    ASSESSMENT AND PLAN: This is a very pleasant 64years old white male recently diagnosed with a stage IIIA non-small cell lung cancer currently undergoing concurrent chemoradiation with  weekly carboplatin and paclitaxel status post 7 cycles.  The patient has been observation for more than a year with no significant evidence for disease progression.  I discussed the scan results with the patient and his wife today. I recommended for him to continue on observation with repeat CT scan of the chest in 4 months. I had a lengthy discussion with the patient today about smoke cessation. I strongly encouraged him to quit smoking and offered him to smoke cessation program. The patient will try to quit on his own. He was advised to call immediately if he has any  concerning symptoms in the interval.  Disclaimer: This note was dictated with voice recognition software. Similar sounding words can inadvertently be transcribed and may not be corrected upon review.

## 2014-02-14 NOTE — Patient Instructions (Signed)
Smoking Cessation, Tips for Success  If you are ready to quit smoking, congratulations! You have chosen to help yourself be healthier. Cigarettes bring nicotine, tar, carbon monoxide, and other irritants into your body. Your lungs, heart, and blood vessels will be able to work better without these poisons. There are many different ways to quit smoking. Nicotine gum, nicotine patches, a nicotine inhaler, or nicotine nasal spray can help with physical craving. Hypnosis, support groups, and medicines help break the habit of smoking.  WHAT THINGS CAN I DO TO MAKE QUITTING EASIER?   Here are some tips to help you quit for good:  · Pick a date when you will quit smoking completely. Tell all of your friends and family about your plan to quit on that date.  · Do not try to slowly cut down on the number of cigarettes you are smoking. Pick a quit date and quit smoking completely starting on that day.  · Throw away all cigarettes.    · Clean and remove all ashtrays from your home, work, and car.  · On a card, write down your reasons for quitting. Carry the card with you and read it when you get the urge to smoke.  · Cleanse your body of nicotine. Drink enough water and fluids to keep your urine clear or pale yellow. Do this after quitting to flush the nicotine from your body.  · Learn to predict your moods. Do not let a bad situation be your excuse to have a cigarette. Some situations in your life might tempt you into wanting a cigarette.  · Never have "just one" cigarette. It leads to wanting another and another. Remind yourself of your decision to quit.  · Change habits associated with smoking. If you smoked while driving or when feeling stressed, try other activities to replace smoking. Stand up when drinking your coffee. Brush your teeth after eating. Sit in a different chair when you read the paper. Avoid alcohol while trying to quit, and try to drink fewer caffeinated beverages. Alcohol and caffeine may urge you to  smoke.  · Avoid foods and drinks that can trigger a desire to smoke, such as sugary or spicy foods and alcohol.  · Ask people who smoke not to smoke around you.  · Have something planned to do right after eating or having a cup of coffee. For example, plan to take a walk or exercise.  · Try a relaxation exercise to calm you down and decrease your stress. Remember, you may be tense and nervous for the first 2 weeks after you quit, but this will pass.  · Find new activities to keep your hands busy. Play with a pen, coin, or rubber band. Doodle or draw things on paper.  · Brush your teeth right after eating. This will help cut down on the craving for the taste of tobacco after meals. You can also try mouthwash.    · Use oral substitutes in place of cigarettes. Try using lemon drops, carrots, cinnamon sticks, or chewing gum. Keep them handy so they are available when you have the urge to smoke.  · When you have the urge to smoke, try deep breathing.  · Designate your home as a nonsmoking area.  · If you are a heavy smoker, ask your health care provider about a prescription for nicotine chewing gum. It can ease your withdrawal from nicotine.  · Reward yourself. Set aside the cigarette money you save and buy yourself something nice.  · Look for   support from others. Join a support group or smoking cessation program. Ask someone at home or at work to help you with your plan to quit smoking.  · Always ask yourself, "Do I need this cigarette or is this just a reflex?" Tell yourself, "Today, I choose not to smoke," or "I do not want to smoke." You are reminding yourself of your decision to quit.  · Do not replace cigarette smoking with electronic cigarettes (commonly called e-cigarettes). The safety of e-cigarettes is unknown, and some may contain harmful chemicals.  · If you relapse, do not give up! Plan ahead and think about what you will do the next time you get the urge to smoke.  HOW WILL I FEEL WHEN I QUIT SMOKING?  You  may have symptoms of withdrawal because your body is used to nicotine (the addictive substance in cigarettes). You may crave cigarettes, be irritable, feel very hungry, cough often, get headaches, or have difficulty concentrating. The withdrawal symptoms are only temporary. They are strongest when you first quit but will go away within 10-14 days. When withdrawal symptoms occur, stay in control. Think about your reasons for quitting. Remind yourself that these are signs that your body is healing and getting used to being without cigarettes. Remember that withdrawal symptoms are easier to treat than the major diseases that smoking can cause.   Even after the withdrawal is over, expect periodic urges to smoke. However, these cravings are generally short lived and will go away whether you smoke or not. Do not smoke!  WHAT RESOURCES ARE AVAILABLE TO HELP ME QUIT SMOKING?  Your health care provider can direct you to community resources or hospitals for support, which may include:  · Group support.  · Education.  · Hypnosis.  · Therapy.  Document Released: 09/21/2003 Document Revised: 05/09/2013 Document Reviewed: 06/10/2012  [employer]® Patient Information ©2015 [employer]. This information is not intended to replace advice given to you by your health care provider. Make sure you discuss any questions you have with your health care provider.

## 2014-02-14 NOTE — Telephone Encounter (Signed)
Gave avs & calendar for June.

## 2014-06-13 ENCOUNTER — Other Ambulatory Visit (HOSPITAL_BASED_OUTPATIENT_CLINIC_OR_DEPARTMENT_OTHER): Payer: Commercial Managed Care - PPO

## 2014-06-13 ENCOUNTER — Ambulatory Visit (HOSPITAL_COMMUNITY)
Admission: RE | Admit: 2014-06-13 | Discharge: 2014-06-13 | Disposition: A | Discharge status: Home / Self Care | Payer: Commercial Managed Care - PPO | Source: Ambulatory Visit | Attending: Internal Medicine | Admitting: Internal Medicine

## 2014-06-13 DIAGNOSIS — Z08 Encounter for follow-up examination after completed treatment for malignant neoplasm: Secondary | ICD-10-CM | POA: Diagnosis not present

## 2014-06-13 DIAGNOSIS — I2510 Atherosclerotic heart disease of native coronary artery without angina pectoris: Secondary | ICD-10-CM | POA: Diagnosis not present

## 2014-06-13 DIAGNOSIS — C3492 Malignant neoplasm of unspecified part of left bronchus or lung: Secondary | ICD-10-CM | POA: Diagnosis not present

## 2014-06-13 DIAGNOSIS — C3412 Malignant neoplasm of upper lobe, left bronchus or lung: Secondary | ICD-10-CM | POA: Diagnosis not present

## 2014-06-13 LAB — CBC WITH DIFFERENTIAL/PLATELET
BASO%: 0.2 % (ref 0.0–2.0)
Basophils Absolute: 0 10*3/uL (ref 0.0–0.1)
EOS ABS: 0.1 10*3/uL (ref 0.0–0.5)
EOS%: 1.6 % (ref 0.0–7.0)
HCT: 40.2 % (ref 38.4–49.9)
HGB: 13 g/dL (ref 13.0–17.1)
LYMPH#: 0.8 10*3/uL — AB (ref 0.9–3.3)
LYMPH%: 9.2 % — ABNORMAL LOW (ref 14.0–49.0)
MCH: 28 pg (ref 27.2–33.4)
MCHC: 32.3 g/dL (ref 32.0–36.0)
MCV: 86.5 fL (ref 79.3–98.0)
MONO#: 0.6 10*3/uL (ref 0.1–0.9)
MONO%: 7.2 % (ref 0.0–14.0)
NEUT%: 81.8 % — AB (ref 39.0–75.0)
NEUTROS ABS: 7.1 10*3/uL — AB (ref 1.5–6.5)
Platelets: 368 10*3/uL (ref 140–400)
RBC: 4.65 10*6/uL (ref 4.20–5.82)
RDW: 15.5 % — AB (ref 11.0–14.6)
WBC: 8.7 10*3/uL (ref 4.0–10.3)

## 2014-06-13 LAB — COMPREHENSIVE METABOLIC PANEL (CC13)
ALK PHOS: 105 U/L (ref 40–150)
ALT: 7 U/L (ref 0–55)
AST: 11 U/L (ref 5–34)
Albumin: 3 g/dL — ABNORMAL LOW (ref 3.5–5.0)
Anion Gap: 7 mEq/L (ref 3–11)
BUN: 13.5 mg/dL (ref 7.0–26.0)
CHLORIDE: 102 meq/L (ref 98–109)
CO2: 29 mEq/L (ref 22–29)
Calcium: 9.3 mg/dL (ref 8.4–10.4)
Creatinine: 1 mg/dL (ref 0.7–1.3)
EGFR: 81 mL/min/{1.73_m2} — AB (ref >90)
Glucose: 79 mg/dl (ref 70–140)
Potassium: 4.4 mEq/L (ref 3.5–5.1)
SODIUM: 139 meq/L (ref 136–145)
Total Bilirubin: 0.26 mg/dL (ref 0.20–1.20)
Total Protein: 6.8 g/dL (ref 6.4–8.3)

## 2014-06-20 ENCOUNTER — Encounter: Payer: Self-pay | Admitting: Internal Medicine

## 2014-06-20 ENCOUNTER — Ambulatory Visit (HOSPITAL_BASED_OUTPATIENT_CLINIC_OR_DEPARTMENT_OTHER): Payer: Commercial Managed Care - PPO | Admitting: Internal Medicine

## 2014-06-20 ENCOUNTER — Telehealth: Payer: Self-pay | Admitting: Internal Medicine

## 2014-06-20 VITALS — BP 114/64 | HR 90 | Temp 98.3°F | Resp 19 | Ht 69.0 in | Wt 154.8 lb

## 2014-06-20 DIAGNOSIS — C3492 Malignant neoplasm of unspecified part of left bronchus or lung: Secondary | ICD-10-CM

## 2014-06-20 DIAGNOSIS — Z85118 Personal history of other malignant neoplasm of bronchus and lung: Secondary | ICD-10-CM | POA: Diagnosis not present

## 2014-06-20 DIAGNOSIS — Z720 Tobacco use: Secondary | ICD-10-CM

## 2014-06-20 NOTE — Progress Notes (Signed)
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 2) Status post left laparoscopic radical nephrectomy under the care of Dr. Louis Meckel on 04/04/2013.  CURRENT THERAPY: Observation.  CHEMOTHERAPY INTENT: Curative  CURRENT # OF CHEMOTHERAPY CYCLES: 0  CURRENT ANTIEMETICS: Zofran, Compazine and Decadron  CURRENT SMOKING STATUS: Current smoker. Smoke cessation consult was done.  ORAL CHEMOTHERAPY AND CONSENT: None  CURRENT BISPHOSPHONATES USE: None  PAIN MANAGEMENT: 0/10  NARCOTICS INDUCED CONSTIPATION: None  LIVING WILL AND CODE STATUS: Full code  INTERVAL HISTORY: [name] 64 y.o. male returns to the clinic today for four-month followup visit accompanied by his wife. He is feeling fine with no specific complaints. The patient denied having any significant chest pain, shortness of breath, cough or hemoptysis. He denied having any fever or chills. He denied having any significant weight loss or night sweats. Unfortunately he continues to smoke and is unwilling to quit. He has repeat CT scan of the chest performed recently and he is here today for evaluation and discussion of his treatment options.  MEDICAL HISTORY: Past Medical History  Diagnosis Date  . Crohn's disease   . Renal mass   . Renal mass, left   . Difficulty sleeping     occasionally  . Testicular cancer 2000    s/p resection, no  recurrence  . Cancer of kidney     left  . Lung cancer     ALLERGIES:  has No Known Allergies.  MEDICATIONS:  Current Outpatient Prescriptions  Medication Sig Dispense Refill  . Astaxanthin 4 MG CAPS Take 1 tablet by mouth daily. With 325 mg of ala    . Cholecalciferol (VITAMIN D-3 PO) Take 10,000 Units by mouth daily. 8000-10000units    . Menaquinone-7 (VITAMIN K2 PO) Take 150 mcg by mouth daily.    Marland Kitchen oxyCODONE (ROXICODONE) 15 MG immediate release tablet Take 15 mg by mouth every 8 (eight) hours as needed for pain (1-2 tid prn pain).    . Probiotic Product (PROBIOTIC DAILY PO) Take 1 capsule by mouth daily.    . TURMERIC CURCUMIN PO Take by mouth daily.    . vitamin E 200 UNIT capsule Take by mouth daily.     No current facility-administered medications for this visit.    SURGICAL HISTORY:  Past Surgical History  Procedure Laterality Date  . Testicular removal  2000  . Appendectomy    . Video bronchoscopy N/A 11/30/2012    Procedure: VIDEO BRONCHOSCOPY WITH FLUORO;  Surgeon: Elsie Stain, MD;  Location: [facility];  Service: Cardiopulmonary;  Laterality: N/A;  . Hernia repair      x 2  . Laparoscopic nephrectomy Left 04/04/2013    Procedure: LAPAROSCOPIC LEFT RADICAL NEPHRECTOMY ;  Surgeon: [name], MD;  Location: [facility];  Service: Urology;  Laterality: Left;    REVIEW OF SYSTEMS:  Constitutional: negative Eyes: negative Ears, nose, mouth, throat, and face: negative Respiratory: negative Cardiovascular: negative Gastrointestinal: negative  Genitourinary:negative Integument/breast: negative Hematologic/lymphatic: negative Musculoskeletal:negative Neurological: negative Behavioral/Psych: negative Endocrine: negative Allergic/Immunologic: negative   PHYSICAL EXAMINATION: General appearance: alert, cooperative, fatigued and no distress Head: Normocephalic, without obvious abnormality, atraumatic Neck: no adenopathy, no JVD, supple, symmetrical, trachea  midline and thyroid not enlarged, symmetric, no tenderness/mass/nodules Lymph nodes: Cervical, supraclavicular, and axillary nodes normal. Resp: clear to auscultation bilaterally Back: symmetric, no curvature. ROM normal. No CVA tenderness. Cardio: regular rate and rhythm, S1, S2 normal, no murmur, click, rub or gallop GI: soft, non-tender; bowel sounds normal; no masses,  no organomegaly Extremities: extremities normal, atraumatic, no cyanosis or edema Neurologic: Alert and oriented X 3, normal strength and tone. Normal symmetric reflexes. Normal coordination and gait  ECOG PERFORMANCE STATUS: 1 - Symptomatic but completely ambulatory  Blood pressure 114/64, pulse 90, temperature 98.3 F (36.8 C), temperature source Oral, resp. rate 19, height 5' 9"  (1.753 m), weight 154 lb 12.8 oz (70.217 kg), SpO2 100 %.  LABORATORY DATA: Lab Results  Component Value Date   WBC 8.7 06/13/2014   HGB 13.0 06/13/2014   HCT 40.2 06/13/2014   MCV 86.5 06/13/2014   PLT 368 06/13/2014      Chemistry      Component Value Date/Time   NA 139 06/13/2014 1433   NA 135* 04/05/2013 0348   K 4.4 06/13/2014 1433   K 4.4 04/05/2013 0348   CL 99 04/05/2013 0348   CO2 29 06/13/2014 1433   CO2 28 04/05/2013 0348   BUN 13.5 06/13/2014 1433   BUN 10 04/05/2013 0348   CREATININE 1.0 06/13/2014 1433   CREATININE 1.13 04/05/2013 0348      Component Value Date/Time   CALCIUM 9.3 06/13/2014 1433   CALCIUM 9.0 04/05/2013 0348   ALKPHOS 105 06/13/2014 1433   ALKPHOS 106 03/30/2013 1350   AST 11 06/13/2014 1433   AST 9 03/30/2013 1350   ALT 7 06/13/2014 1433   ALT 6 03/30/2013 1350   BILITOT 0.26 06/13/2014 1433   BILITOT 0.3 03/30/2013 1350       RADIOGRAPHIC STUDIES: Ct Chest Wo Contrast  06/13/2014   CLINICAL DATA:  Restaging lung cancer  EXAM: CT CHEST WITHOUT CONTRAST  TECHNIQUE: Multidetector CT imaging of the chest was performed following the standard protocol without IV contrast.  COMPARISON:   02/07/2014  FINDINGS: Mediastinum: The heart size appears normal. There is no pericardial effusion. Calcified atherosclerotic plaque involves the thoracic aorta as well as the LAD coronary artery. Calcified sub- carinal and right hilar lymph nodes are identified consistent with prior granulomatous disease. No mediastinal or hilar adenopathy noted.  Lungs/Pleura: No pleural effusion. Index nodular density in the perihilar left upper lobe measures 6 mm, image 20/ series 5. Previously 7 mm. No new or enlarging pulmonary nodules or masses noted.  Upper Abdomen: The visualized portions of the liver and spleen are unremarkable. The visualized portions of the adrenal glands and kidneys are also normal.  Musculoskeletal: There is no aggressive lytic or sclerotic bone lesion identified. Degenerative disc disease is noted within the thoracic spine.  IMPRESSION: 1. No acute cardiopulmonary abnormalities and no specific features to suggest residual or recurrence of tumor. 2. Atherosclerotic disease as well as Coronary artery calcifications. 3. Prior granulomatous disease.   Electronically Signed   By: Kerby Moors M.D.   On: 06/13/2014 15:23    ASSESSMENT AND PLAN: This is a very pleasant 64 years old white male recently diagnosed with a stage IIIA non-small cell lung cancer currently undergoing concurrent chemoradiation with  weekly carboplatin and paclitaxel status post 7 cycles.  The patient has been observation since January 2015 with no significant evidence for disease progression.  I discussed the scan results with the patient and his wife today. I recommended for him to continue on observation with repeat CT scan of the chest in 6 months. I had a lengthy discussion with the patient today about smoke cessation. I again strongly encouraged him to quit smoking and offered him to smoke cessation program.  He was advised to call immediately if he has any concerning symptoms in the interval.  Disclaimer: This note  was dictated with voice recognition software. Similar sounding words can inadvertently be transcribed and may not be corrected upon review.

## 2014-06-20 NOTE — Telephone Encounter (Signed)
per p[facility] to sch pt appt-gave pt avs-adv pt [facility] will call to scan

## 2014-06-20 NOTE — Patient Instructions (Signed)
Smoking Cessation Quitting smoking is important to your health and has many advantages. However, it is not always easy to quit since nicotine is a very addictive drug. Oftentimes, people try 3 times or more before being able to quit. This document explains the best ways for you to prepare to quit smoking. Quitting takes hard work and a lot of effort, but you can do it. ADVANTAGES OF QUITTING SMOKING  You will live longer, feel better, and live better.  Your body will feel the impact of quitting smoking almost immediately.  Within 20 minutes, blood pressure decreases. Your pulse returns to its normal level.  After 8 hours, carbon monoxide levels in the blood return to normal. Your oxygen level increases.  After 24 hours, the chance of having a heart attack starts to decrease. Your breath, hair, and body stop smelling like smoke.  After 48 hours, damaged nerve endings begin to recover. Your sense of taste and smell improve.  After 72 hours, the body is virtually free of nicotine. Your bronchial tubes relax and breathing becomes easier.  After 2 to 12 weeks, lungs can hold more air. Exercise becomes easier and circulation improves.  The risk of having a heart attack, stroke, cancer, or lung disease is greatly reduced.  After 1 year, the risk of coronary heart disease is cut in half.  After 5 years, the risk of stroke falls to the same as a nonsmoker.  After 10 years, the risk of lung cancer is cut in half and the risk of other cancers decreases significantly.  After 15 years, the risk of coronary heart disease drops, usually to the level of a nonsmoker.  If you are pregnant, quitting smoking will improve your chances of having a healthy baby.  The people you live with, especially any children, will be healthier.  You will have extra money to spend on things other than cigarettes. QUESTIONS TO THINK ABOUT BEFORE ATTEMPTING TO QUIT You may want to talk about your answers with your  health care provider.  Why do you want to quit?  If you tried to quit in the past, what helped and what did not?  What will be the most difficult situations for you after you quit? How will you plan to handle them?  Who can help you through the tough times? Your family? Friends? A health care provider?  What pleasures do you get from smoking? What ways can you still get pleasure if you quit? Here are some questions to ask your health care provider:  How can you help me to be successful at quitting?  What medicine do you think would be best for me and how should I take it?  What should I do if I need more help?  What is smoking withdrawal like? How can I get information on withdrawal? GET READY  Set a quit date.  Change your environment by getting rid of all cigarettes, ashtrays, matches, and lighters in your home, car, or work. Do not let people smoke in your home.  Review your past attempts to quit. Think about what worked and what did not. GET SUPPORT AND ENCOURAGEMENT You have a better chance of being successful if you have help. You can get support in many ways.  Tell your family, friends, and coworkers that you are going to quit and need their support. Ask them not to smoke around you.  Get individual, group, or telephone counseling and support. Programs are available at local hospitals and health centers. Call   your local health department for information about programs in your area.  Spiritual beliefs and practices may help some smokers quit.  Download a "quit meter" on your computer to keep track of quit statistics, such as how long you have gone without smoking, cigarettes not smoked, and money saved.  Get a self-help book about quitting smoking and staying off tobacco. LEARN NEW SKILLS AND BEHAVIORS  Distract yourself from urges to smoke. Talk to someone, go for a walk, or occupy your time with a task.  Change your normal routine. Take a different route to work.  Drink tea instead of coffee. Eat breakfast in a different place.  Reduce your stress. Take a hot bath, exercise, or read a book.  Plan something enjoyable to do every day. Reward yourself for not smoking.  Explore interactive web-based programs that specialize in helping you quit. GET MEDICINE AND USE IT CORRECTLY Medicines can help you stop smoking and decrease the urge to smoke. Combining medicine with the above behavioral methods and support can greatly increase your chances of successfully quitting smoking.  Nicotine replacement therapy helps deliver nicotine to your body without the negative effects and risks of smoking. Nicotine replacement therapy includes nicotine gum, lozenges, inhalers, nasal sprays, and skin patches. Some may be available over-the-counter and others require a prescription.  Antidepressant medicine helps people abstain from smoking, but how this works is unknown. This medicine is available by prescription.  Nicotinic receptor partial agonist medicine simulates the effect of nicotine in your brain. This medicine is available by prescription. Ask your health care provider for advice about which medicines to use and how to use them based on your health history. Your health care provider will tell you what side effects to look out for if you choose to be on a medicine or therapy. Carefully read the information on the package. Do not use any other product containing nicotine while using a nicotine replacement product.  RELAPSE OR DIFFICULT SITUATIONS Most relapses occur within the first 3 months after quitting. Do not be discouraged if you start smoking again. Remember, most people try several times before finally quitting. You may have symptoms of withdrawal because your body is used to nicotine. You may crave cigarettes, be irritable, feel very hungry, cough often, get headaches, or have difficulty concentrating. The withdrawal symptoms are only temporary. They are strongest  when you first quit, but they will go away within 10-14 days. To reduce the chances of relapse, try to:  Avoid drinking alcohol. Drinking lowers your chances of successfully quitting.  Reduce the amount of caffeine you consume. Once you quit smoking, the amount of caffeine in your body increases and can give you symptoms, such as a rapid heartbeat, sweating, and anxiety.  Avoid smokers because they can make you want to smoke.  Do not let weight gain distract you. Many smokers will gain weight when they quit, usually less than 10 pounds. Eat a healthy diet and stay active. You can always lose the weight gained after you quit.  Find ways to improve your mood other than smoking. FOR MORE INFORMATION  www.smokefree.gov  Document Released: 12/17/2000 Document Revised: 05/09/2013 Document Reviewed: 04/03/2011 [employer] Patient Information 2015 [employer]. This information is not intended to replace advice given to you by your health care provider. Make sure you discuss any questions you have with your health care provider.  

## 2014-07-03 ENCOUNTER — Other Ambulatory Visit: Payer: Self-pay

## 2014-10-18 DIAGNOSIS — G4700 Insomnia, unspecified: Secondary | ICD-10-CM | POA: Diagnosis not present

## 2014-10-18 DIAGNOSIS — Z23 Encounter for immunization: Secondary | ICD-10-CM | POA: Diagnosis not present

## 2014-11-21 ENCOUNTER — Telehealth: Payer: Self-pay | Admitting: *Deleted

## 2014-11-21 ENCOUNTER — Other Ambulatory Visit: Payer: Self-pay

## 2014-11-21 ENCOUNTER — Emergency Department (HOSPITAL_COMMUNITY)
Admission: EM | Admit: 2014-11-21 | Discharge: 2014-11-21 | Disposition: A | Discharge status: Home / Self Care | Payer: Medicare Other | Attending: Emergency Medicine | Admitting: Emergency Medicine

## 2014-11-21 ENCOUNTER — Other Ambulatory Visit (HOSPITAL_BASED_OUTPATIENT_CLINIC_OR_DEPARTMENT_OTHER): Payer: Medicare Other

## 2014-11-21 ENCOUNTER — Emergency Department (HOSPITAL_COMMUNITY): Payer: Medicare Other

## 2014-11-21 ENCOUNTER — Ambulatory Visit (HOSPITAL_COMMUNITY)
Admission: RE | Admit: 2014-11-21 | Discharge: 2014-11-21 | Disposition: A | Discharge status: Home / Self Care | Payer: Medicare Other | Source: Ambulatory Visit | Attending: Nurse Practitioner | Admitting: Nurse Practitioner

## 2014-11-21 ENCOUNTER — Ambulatory Visit (HOSPITAL_BASED_OUTPATIENT_CLINIC_OR_DEPARTMENT_OTHER): Payer: Medicare Other | Admitting: Nurse Practitioner

## 2014-11-21 ENCOUNTER — Encounter: Payer: Self-pay | Admitting: Nurse Practitioner

## 2014-11-21 VITALS — BP 105/69 | HR 96 | Temp 97.7°F | Resp 20 | Ht 69.0 in | Wt 150.8 lb

## 2014-11-21 DIAGNOSIS — R918 Other nonspecific abnormal finding of lung field: Secondary | ICD-10-CM | POA: Diagnosis not present

## 2014-11-21 DIAGNOSIS — C3492 Malignant neoplasm of unspecified part of left bronchus or lung: Secondary | ICD-10-CM

## 2014-11-21 DIAGNOSIS — R0600 Dyspnea, unspecified: Secondary | ICD-10-CM | POA: Insufficient documentation

## 2014-11-21 DIAGNOSIS — R0602 Shortness of breath: Secondary | ICD-10-CM | POA: Diagnosis not present

## 2014-11-21 DIAGNOSIS — Z8719 Personal history of other diseases of the digestive system: Secondary | ICD-10-CM | POA: Diagnosis not present

## 2014-11-21 DIAGNOSIS — Z85528 Personal history of other malignant neoplasm of kidney: Secondary | ICD-10-CM | POA: Insufficient documentation

## 2014-11-21 DIAGNOSIS — Z79899 Other long term (current) drug therapy: Secondary | ICD-10-CM | POA: Diagnosis not present

## 2014-11-21 DIAGNOSIS — R079 Chest pain, unspecified: Secondary | ICD-10-CM | POA: Diagnosis present

## 2014-11-21 DIAGNOSIS — Z85118 Personal history of other malignant neoplasm of bronchus and lung: Secondary | ICD-10-CM | POA: Diagnosis not present

## 2014-11-21 DIAGNOSIS — R110 Nausea: Secondary | ICD-10-CM | POA: Diagnosis not present

## 2014-11-21 DIAGNOSIS — Z8547 Personal history of malignant neoplasm of testis: Secondary | ICD-10-CM | POA: Diagnosis not present

## 2014-11-21 DIAGNOSIS — F17210 Nicotine dependence, cigarettes, uncomplicated: Secondary | ICD-10-CM | POA: Diagnosis not present

## 2014-11-21 DIAGNOSIS — I319 Disease of pericardium, unspecified: Secondary | ICD-10-CM | POA: Insufficient documentation

## 2014-11-21 DIAGNOSIS — R0789 Other chest pain: Secondary | ICD-10-CM | POA: Diagnosis not present

## 2014-11-21 LAB — CBC WITH DIFFERENTIAL/PLATELET
BASO%: 0.2 % (ref 0.0–2.0)
BASOS ABS: 0 10*3/uL (ref 0.0–0.1)
EOS ABS: 0.1 10*3/uL (ref 0.0–0.5)
EOS%: 0.6 % (ref 0.0–7.0)
HCT: 45.3 % (ref 38.4–49.9)
HGB: 14.7 g/dL (ref 13.0–17.1)
LYMPH#: 1 10*3/uL (ref 0.9–3.3)
LYMPH%: 8.5 % — AB (ref 14.0–49.0)
MCH: 28.6 pg (ref 27.2–33.4)
MCHC: 32.5 g/dL (ref 32.0–36.0)
MCV: 88.1 fL (ref 79.3–98.0)
MONO#: 0.9 10*3/uL (ref 0.1–0.9)
MONO%: 7.8 % (ref 0.0–14.0)
NEUT#: 9.6 10*3/uL — ABNORMAL HIGH (ref 1.5–6.5)
NEUT%: 82.9 % — AB (ref 39.0–75.0)
PLATELETS: 333 10*3/uL (ref 140–400)
RBC: 5.14 10*6/uL (ref 4.20–5.82)
RDW: 15.2 % — ABNORMAL HIGH (ref 11.0–14.6)
WBC: 11.6 10*3/uL — ABNORMAL HIGH (ref 4.0–10.3)

## 2014-11-21 LAB — COMPREHENSIVE METABOLIC PANEL (CC13)
ANION GAP: 8 meq/L (ref 3–11)
AST: 7 U/L (ref 5–34)
Albumin: 3.1 g/dL — ABNORMAL LOW (ref 3.5–5.0)
Alkaline Phosphatase: 95 U/L (ref 40–150)
BILIRUBIN TOTAL: 0.71 mg/dL (ref 0.20–1.20)
BUN: 12 mg/dL (ref 7.0–26.0)
CALCIUM: 9.9 mg/dL (ref 8.4–10.4)
CHLORIDE: 101 meq/L (ref 98–109)
CO2: 28 mEq/L (ref 22–29)
CREATININE: 1.3 mg/dL (ref 0.7–1.3)
EGFR: 58 mL/min/{1.73_m2} — ABNORMAL LOW (ref >90)
Glucose: 101 mg/dl (ref 70–140)
Potassium: 5 mEq/L (ref 3.5–5.1)
Sodium: 137 mEq/L (ref 136–145)
Total Protein: 6.9 g/dL (ref 6.4–8.3)

## 2014-11-21 LAB — I-STAT TROPONIN, ED: TROPONIN I, POC: 0 ng/mL (ref 0.00–0.08)

## 2014-11-21 MED ORDER — IOHEXOL 350 MG/ML SOLN
100.0000 mL | Freq: Once | INTRAVENOUS | Status: AC | PRN
  Administered 2014-11-21: 100 mL via INTRAVENOUS

## 2014-11-21 MED ORDER — COLCHICINE 0.6 MG PO TABS: 0.6000 mg | ORAL_TABLET | Freq: Every day | ORAL | Status: DC

## 2014-11-21 MED ORDER — COLCHICINE 0.6 MG PO TABS
1.2000 mg | ORAL_TABLET | Freq: Once | ORAL | Status: AC
  Administered 2014-11-21: 1.2 mg via ORAL
  Filled 2014-11-21: qty 2

## 2014-11-21 NOTE — ED Provider Notes (Signed)
Complains of pleuritic sharp anterior chest pain for the past 2 days worse with lying supine improved with sitting up discomfort is mild at present. On exam patient is alert Glasgow Coma Score 15 lungs clear auscultation heart regular rate and rhythm no murmurs or rubs ED ECG REPORT   Date: 11/21/2014  Rate: 85  Rhythm: normal sinus rhythm  QRS Axis: normal  Intervals: normal  ST/T Wave abnormalities: Diffuse ST segment elevation  Conduction Disutrbances:none  Narrative Interpretation:   Old EKG Reviewed: Diffuse ST segment elevation use since 12/07/2009  I have personally reviewed the EKG tracing and disagree with the computerized printout as noted. EKG consistent with pericarditis, doubt acute MI Spoke with Dr.Quareshi from [facility] requests colchicine 1.2 mg loading dose in the emergency department. Prescription for colchicine 0.6 mg daily. Follow-up in office in 1 week  Orlie Dakin, MD 11/21/14 2142

## 2014-11-21 NOTE — Progress Notes (Signed)
SYMPTOM MANAGEMENT CLINIC   HPI: [name] 64 y.o. male diagnosed with lung cancer.  Patient is status post both chemotherapy and radiation treatments completed in March 2014.  Currently undergoing observation only.   Patient states that he developed acute onset dyspnea and pain with inspiration.  Sunday evening, 11/19/2014.  He denies any actual cardiac chest pain; and also denies any radiation of the pain.  He is also complaining of increased fatigue recently as well.  He denies any recent fevers or chills.  Patient also reports a history of chronic pain to his neck.  He typically takes oxycodone 15 mg 3 times per day.  He is followed by pain management physician Dr. Nelva Bush.  HPI  ROS  Past Medical History  Diagnosis Date  . Crohn's disease ([place])   . Renal mass   . Renal mass, left   . Difficulty sleeping     occasionally  . 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 Cancer of kidney ([place])     left  . Lung cancer [facility])     Past Surgical History  Procedure Laterality Date  . Testicular removal  2000  . Appendectomy    . Video bronchoscopy N/A 11/30/2012    Procedure: VIDEO BRONCHOSCOPY WITH FLUORO;  Surgeon: Elsie Stain, MD;  Location: [facility];  Service: Cardiopulmonary;  Laterality: N/A;  . Hernia repair      x 2  . Laparoscopic nephrectomy Left 04/04/2013    Procedure: LAPAROSCOPIC LEFT RADICAL NEPHRECTOMY ;  Surgeon: [name], MD;  Location: [facility];  Service: Urology;  Laterality: Left;    has Cancer of left lung parenchyma ([place]); Renal mass, left; Tobacco use disorder; COPD with chronic bronchitis stage B.; and Dyspnea on his problem list.    has No Known Allergies.    Medication List       This list is accurate as of: 11/21/14  5:23 PM.  Always use your most recent med list.               Astaxanthin 4 MG Caps  Take 1 tablet by mouth daily. With 325 mg of ala     oxyCODONE 15 MG immediate release tablet    Commonly known as:  ROXICODONE  Take 15 mg by mouth every 8 (eight) hours as needed for pain (1-2 tid prn pain).     PROBIOTIC DAILY PO  Take 1 capsule by mouth daily.     TURMERIC CURCUMIN PO  Take by mouth daily.     VITAMIN D-3 PO  Take 10,000 Units by mouth daily. 8000-10000units     vitamin E 200 UNIT capsule  Take by mouth daily.     VITAMIN K2 PO  Take 150 mcg by mouth daily.         PHYSICAL EXAMINATION  Oncology Vitals 11/21/2014 11/21/2014  Height - 175 cm  Weight - 68.402 kg  Weight (lbs) - 150 lbs 13 oz  BMI (kg/m2) - 22.27 kg/m2  Temp 98.9 97.7  Pulse 93 96  Resp 18 20  SpO2 97 97  BSA (m2) - 1.82 m2   BP Readings from Last 2 Encounters:  11/21/14 120/66  11/21/14 105/69    Physical Exam  Constitutional: He is oriented to person, place, and time.  Patient appears fatigued, slightly weak, and chronically ill.  HENT:  Head: Normocephalic and atraumatic.  Mouth/Throat: Oropharynx is clear and moist.  Eyes: Conjunctivae and EOM are  normal. Pupils are equal, round, and reactive to light. Right eye exhibits no discharge. Left eye exhibits no discharge. No scleral icterus.  Neck: Normal range of motion. Neck supple. No JVD present. No tracheal deviation present. No thyromegaly present.  Cardiovascular: Normal rate, regular rhythm, normal heart sounds and intact distal pulses.   Pulmonary/Chest: Effort normal and breath sounds normal. No respiratory distress. He has no wheezes. He has no rales. He exhibits no tenderness.  Abdominal: Soft. Bowel sounds are normal. He exhibits no distension and no mass. There is no tenderness. There is no rebound and no guarding.  Musculoskeletal: Normal range of motion. He exhibits no edema or tenderness.  Lymphadenopathy:    He has no cervical adenopathy.  Neurological: He is alert and oriented to person, place, and time. Gait normal.  Skin: Skin is warm and dry. No rash noted. No erythema. There is pallor.  Psychiatric:  Affect normal.  Nursing note and vitals reviewed.   LABORATORY DATA:. Appointment on 11/21/2014  Component Date Value Ref Range Status  . WBC 11/21/2014 11.6* 4.0 - 10.3 10e3/uL Final  . NEUT# 11/21/2014 9.6* 1.5 - 6.5 10e3/uL Final  . HGB 11/21/2014 14.7  13.0 - 17.1 g/dL Final  . HCT 11/21/2014 45.3  38.4 - 49.9 % Final  . Platelets 11/21/2014 333  140 - 400 10e3/uL Final  . MCV 11/21/2014 88.1  79.3 - 98.0 fL Final  . MCH 11/21/2014 28.6  27.2 - 33.4 pg Final  . MCHC 11/21/2014 32.5  32.0 - 36.0 g/dL Final  . RBC 11/21/2014 5.14  4.20 - 5.82 10e6/uL Final  . RDW 11/21/2014 15.2* 11.0 - 14.6 % Final  . lymph# 11/21/2014 1.0  0.9 - 3.3 10e3/uL Final  . MONO# 11/21/2014 0.9  0.1 - 0.9 10e3/uL Final  . Eosinophils Absolute 11/21/2014 0.1  0.0 - 0.5 10e3/uL Final  . Basophils Absolute 11/21/2014 0.0  0.0 - 0.1 10e3/uL Final  . NEUT% 11/21/2014 82.9* 39.0 - 75.0 % Final  . LYMPH% 11/21/2014 8.5* 14.0 - 49.0 % Final  . MONO% 11/21/2014 7.8  0.0 - 14.0 % Final  . EOS% 11/21/2014 0.6  0.0 - 7.0 % Final  . BASO% 11/21/2014 0.2  0.0 - 2.0 % Final  . Sodium 11/21/2014 137  136 - 145 mEq/L Final  . Potassium 11/21/2014 5.0  3.5 - 5.1 mEq/L Final  . Chloride 11/21/2014 101  98 - 109 mEq/L Final  . CO2 11/21/2014 28  22 - 29 mEq/L Final  . Glucose 11/21/2014 101  70 - 140 mg/dl Final   Glucose reference range is for nonfasting patients. Fasting glucose reference range is 70- 100.  Marland Kitchen BUN 11/21/2014 12.0  7.0 - 26.0 mg/dL Final  . Creatinine 11/21/2014 1.3  0.7 - 1.3 mg/dL Final  . Total Bilirubin 11/21/2014 0.71  0.20 - 1.20 mg/dL Final  . Alkaline Phosphatase 11/21/2014 95  40 - 150 U/L Final  . AST 11/21/2014 7  5 - 34 U/L Final  . ALT 11/21/2014 <9  0 - 55 U/L Final  . Total Protein 11/21/2014 6.9  6.4 - 8.3 g/dL Final  . Albumin 11/21/2014 3.1* 3.5 - 5.0 g/dL Final  . Calcium 11/21/2014 9.9  8.4 - 10.4 mg/dL Final  . Anion Gap 11/21/2014 8  3 - 11 mEq/L Final  . EGFR 11/21/2014 58*  >90 ml/min/1.73 m2 Final   eGFR is calculated using the CKD-EPI Creatinine Equation (2009)     RADIOGRAPHIC STUDIES: Dg Chest 2 View  11/21/2014  CLINICAL DATA:  Left upper lobe lung cancer. Pleuritic shortness of breath and chest pain. EXAM: CHEST  2 VIEW COMPARISON:  06/13/2014 chest CT and 11/15/2012 chest radiograph. FINDINGS: Stable cardiomediastinal silhouette with normal heart size. No pneumothorax. No pleural effusion. There is stable distortion and curvilinear opacity in the parahilar left upper lobe. No pulmonary edema. No new focal lung opacity. IMPRESSION: Stable post treatment change in the parahilar left upper lobe. No acute cardiopulmonary disease. Electronically Signed   By: Ilona Sorrel M.D.   On: 11/21/2014 16:34    ASSESSMENT/PLAN:    Cancer of left lung parenchyma [facility]) Patient is status post chemotherapy and radiation treatments completed in March 2014.  Patient has been undergoing yearly visits and restaging CTs recently.  Patient states that he developed acute onset dyspnea and pain with inspiration.  Sunday evening, 11/19/2014.  He denies any actual cardiac chest pain; and also denies any radiation of the pain.  He is also complaining of increased fatigue recently as well.  He denies any recent fevers or chills.  Labs obtained today reveal a WBC of 11.6, ANC 9.6, hemoglobin of 14.7, platelet count 333.  Please see further notes regarding the chief complaint today.  Patient is scheduled for labs and a restaging CT of the chest on 12/13/2014.  Patient is scheduled for follow-up visit with Dr. Julien Nordmann on 12/20/2014.  Dyspnea Patient states that he developed acute onset dyspnea and pain with inspiration.  Sunday evening, 11/19/2014.  He denies any actual cardiac chest pain; and also denies any radiation of the pain.  He is also complaining of increased fatigue recently as well.  He denies any recent fevers or chills.  Exam reveals good breath sounds bilaterally with no  cough or wheeze.  No obvious shortness of breath on exam.  Patient does appear fatigued and slightly weak.  Labs obtained today reveal a WBC of 11.6, ANC 9.6, hemoglobin of 14.7, platelet count 333.  Chest x-ray obtained today reveal no acute abnormalities.  Given patient's slightly elevated white count, acute onset shortness of breath, and pain with inspiration.-Will transport patient to the emergency department for further evaluation and management.  Brief history report were given to the emergency department charge nurse prior to transport patient to the emergency department via wheelchair.  Per the cancer Center nurse.  Differential diagnosis includes: Pneumonia, pulmonary embolism, cardiac chest pain, and progression of disease.      Patient stated understanding of all instructions; and was in agreement with this plan of care. The patient knows to call the clinic with any problems, questions or concerns.   This was a shared visit with Dr. Julien Nordmann today.  Total time spent with patient was 25 minutes;  with greater than 75 percent of that time spent in face to face counseling regarding patient's symptoms,  and coordination of care and follow up.  Disclaimer:This dictation was prepared with Dragon/digital dictation along with [employer]. Any transcriptional errors that result from this process are unintentional.  Drue Second, NP 11/21/2014   ADDENDUM: Hematology/Oncology Attending:  I had a face to face encounter with the patient. I recommended his care plan. This is a very pleasant 64 years old white male with history of stage IIIa non-small cell lung cancer status post course of concurrent chemoradiation and currently on observation with no concerning findings on the previous imaging studies. The patient presented today complaining of acute shortness of breath as well as pain with inspiration started recently with increasing fatigue. Chest  x-ray showed no acute  findings I recommended for the patient to have a CT angiogram of the chest to rule out any pulmonary embolism or disease progression. We will send the patient to the emergency department for further evaluation and consultation with cardiology as needed  we will see him back for follow-up visit as previously scheduled.  The patient was advised to call immediately if he has any other concerning symptoms in the interval.   Disclaimer: This note was dictated with voice recognition software. Similar sounding words can inadvertently be transcribed and may be missed upon review. Eilleen Kempf., MD 11/22/2014

## 2014-11-21 NOTE — ED Notes (Signed)
Pt states that he started having sharp chest pain to the left side that is only noticed with inspirations. Pt resting on stretcher in no apparent distress.

## 2014-11-21 NOTE — Telephone Encounter (Signed)
This nurse received call from [facility] asking for lab appointment for patient having shortness of breath and trouble breathing.  Instructed to have patient call office.

## 2014-11-21 NOTE — Telephone Encounter (Signed)
Call received from [facility] and [name] who reported he's already called [place].  Patient reports this is a "new symptom he's never experienced.  Started yesterday with shortness of breath on inhalation with sharp pain around heart that is continuous pain on every inhalation at rest and with activity.  Occasionally non-productive cough.  No fever."    1:00 pm This nurse advised him to come in for [facility] visit after CXR.  He has to shower and get dressed.  Advised he do so quickly.  Will try to arrive to [facility] for CXR at 2:00 pm.  Added lab appointment for 2:45 pm and [facility] at 3:15 pm.

## 2014-11-21 NOTE — Discharge Instructions (Signed)
You have been seen today for chest pain. Your imaging and lab tests showed no abnormalities. Follow up with PCP as needed. Return to ED should symptoms worsen.

## 2014-11-21 NOTE — ED Provider Notes (Signed)
CSN: [id number]     Arrival date & time 11/21/14  1723 History   First MD Initiated Contact with Patient 11/21/14 1742     Chief Complaint  Patient presents with  . Chest Pain     (Consider location/radiation/quality/duration/timing/severity/associated sxs/prior Treatment) HPI   [name] is a 64 y.o. male, with a history of Small Cell Squamous Lung cancer, presenting to the ED with chest pain to the left of his sternum, sharp in nature, currently a 2/10, non-radiating, beginning 2 days ago. Increases with deep breathing or palpation. Pt states the pain has improved since he took his last oxycodone about an hour ago.  Accompanied by some shortness of breath, nausea, and fatigue for the last week. Denies vomiting, abdominal pain, diarrhea/constipation, fever/chills, changes in medication, increase in his normal "smoker's cough," or any other complaints.  Pt was seen by Dr. Inda Merlin at the [facility] today, just before arriving at the ED.  Pt is done with his radiation and is no longer receiving treatment as pt is in remission. Pt last chemo and radiation treatments were in March 2015. Pt is still an active 1 PPD smoker.    Past Medical History  Diagnosis Date  . Crohn's disease ([place])   . Renal mass   . Renal mass, left   . Difficulty sleeping     occasionally  . 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 Lung cancer [facility])    Past Surgical History  Procedure Laterality Date  . Testicular removal  2000  . Appendectomy    . Video bronchoscopy N/A 11/30/2012    Procedure: VIDEO BRONCHOSCOPY WITH FLUORO;  Surgeon: Elsie Stain, MD;  Location: [facility];  Service: Cardiopulmonary;  Laterality: N/A;  . Hernia repair      x 2  . Laparoscopic nephrectomy Left 04/04/2013    Procedure: LAPAROSCOPIC LEFT RADICAL NEPHRECTOMY ;  Surgeon: [name], MD;  Location: [facility];  Service: Urology;  Laterality: Left;    Family History  Problem Relation Age of Onset  . Asthma Brother   . Testicular cancer Brother   . Cancer Brother   . Breast cancer Sister    Social History  Substance Use Topics  . Smoking status: Current Every Day Smoker -- 0.75 packs/day    Types: Cigarettes    Start date: 01/06/1966  . Smokeless tobacco: Never Used     Comment: Smoking less than 1 ppd right now  . Alcohol Use: Yes     Comment: occasional    Review of Systems  Constitutional: Negative for fever, chills, diaphoresis and unexpected weight change.  Respiratory: Negative for cough, chest tightness and shortness of breath.   Cardiovascular: Positive for chest pain. Negative for palpitations and leg swelling.  Gastrointestinal: Negative for nausea, vomiting, abdominal pain, diarrhea and constipation.  Genitourinary: Negative for dysuria and flank pain.  Musculoskeletal: Negative for back pain.  Skin: Negative for color change and pallor.  Neurological: Negative for dizziness, syncope, weakness and light-headedness.  All other systems reviewed and are negative.     Allergies  Review of patient's allergies indicates no known allergies.  Home Medications   Prior to Admission medications   Medication Sig Start Date End Date Taking? Authorizing Provider  acetaminophen (TYLENOL) 500 MG tablet Take 1,000 mg by mouth every 6 (six) hours as needed for mild pain, moderate pain, fever or headache.  Yes Historical Provider, MD  Cholecalciferol (VITAMIN D-3 PO) Take 5,000 Units by mouth daily.    Yes Historical Provider, MD  Menaquinone-7 (VITAMIN K2 PO) Take 150 mcg by mouth daily.   Yes Historical Provider, MD  oxyCODONE (ROXICODONE) 15 MG immediate release tablet Take 15 mg by mouth every 8 (eight) hours as needed for pain (1-2 tid prn pain).   Yes Suella Broad, MD  Probiotic Product (PROBIOTIC DAILY PO) Take 1 capsule by mouth daily.   Yes Historical Provider, MD  TURMERIC CURCUMIN PO Take 1 tablet by mouth  daily.    Yes Historical Provider, MD  vitamin E 200 UNIT capsule Take 200 Units by mouth daily.    Yes Historical Provider, MD  colchicine 0.6 MG tablet Take 1 tablet (0.6 mg total) by mouth at bedtime. 11/21/14   Shawn C Joy, PA-C   BP 99/61 mmHg  Pulse 89  Temp(Src) 98.9 F (37.2 C) (Oral)  Resp 12  SpO2 91% Physical Exam  Constitutional: He appears well-developed and well-nourished. No distress.  HENT:  Head: Normocephalic and atraumatic.  Eyes: Conjunctivae are normal. Pupils are equal, round, and reactive to light.  Cardiovascular: Normal rate, regular rhythm, normal heart sounds and intact distal pulses.   Pulmonary/Chest: Effort normal and breath sounds normal. No respiratory distress.  Minimal tenderness to palpation to the left of the patient's sternum.  Abdominal: Soft. Bowel sounds are normal.  Musculoskeletal: He exhibits no edema or tenderness.  Neurological: He is alert.  Skin: Skin is warm and dry. He is not diaphoretic.  Nursing note and vitals reviewed.   ED Course  Procedures (including critical care time) [place], ED   Results for orders placed or performed during the hospital encounter of 11/21/14  I-stat troponin, ED (not at [facility], [facility])  Result Value Ref Range   Troponin i, poc 0.00 0.00 - 0.08 ng/mL   Comment 3           Imaging Review Dg Chest 2 View  11/21/2014  CLINICAL DATA:  Left upper lobe lung cancer. Pleuritic shortness of breath and chest pain. EXAM: CHEST  2 VIEW COMPARISON:  06/13/2014 chest CT and 11/15/2012 chest radiograph. FINDINGS: Stable cardiomediastinal silhouette with normal heart size. No pneumothorax. No pleural effusion. There is stable distortion and curvilinear opacity in the parahilar left upper lobe. No pulmonary edema. No new focal lung opacity. IMPRESSION: Stable post treatment change in the parahilar left upper lobe. No acute cardiopulmonary disease. Electronically Signed   By: Ilona Sorrel  M.D.   On: 11/21/2014 16:34   Ct Angio Chest Pe W/cm &/or Wo Cm  11/21/2014  CLINICAL DATA:  Chronic immobilization, shortness of Breath, chest pain EXAM: CT ANGIOGRAPHY CHEST WITH CONTRAST TECHNIQUE: Multidetector CT imaging of the chest was performed using the standard protocol during bolus administration of intravenous contrast. Multiplanar CT image reconstructions and MIPs were obtained to evaluate the vascular anatomy. CONTRAST:  175m OMNIPAQUE IOHEXOL 350 MG/ML SOLN COMPARISON:  06/13/2014 FINDINGS: Right arm IV contrast injection. SVC is patent. Mild contrast reflux from the right atrium into the intrahepatic IVC. Satisfactory opacification of pulmonary arteries noted, and there is no evidence of pulmonary emboli. 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 Patchy coronary calcifications. Adequate contrast opacification of the thoracic aorta with no evidence of dissection, aneurysm, or stenosis. There is classic 3-vessel brachiocephalic arch anatomy without proximal stenosis. Patchy calcifications in the arch and descending aorta. No pleural or pericardial effusion. 19 mm  left suprahilar mass or adenopathy, increased since prior study of 02/07/2014. There is interval narrowing of the left upper lobe bronchus. Adjacent linear scarring in the left upper lobe. Calcified right hilar and subcarinal lymph nodes. Visualized portions of upper abdomen unremarkable. Review of the MIP images confirms the above findings. IMPRESSION: 1. Negative for acute PE or thoracic aortic dissection. 2. Enlarging left suprahilar mass or adenopathy suggesting recurrent neoplasm. Electronically Signed   By: Lucrezia Europe M.D.   On: 11/21/2014 19:26   I have personally reviewed and evaluated these images and lab results as part of my medical decision-making.   EKG Interpretation None      MDM   Final diagnoses:  Chest pain  Hilar mass  Pericarditis    [name] presents with sharp chest pain accompanied by  shortness of breath for the past three days.  Findings and plan of care discussed with Orlie Dakin, MD   Patient had labs drawn at the cancer center prior to arriving at the ED today, thus repeat labs were not necessary at this time. Suspect pericarditis versus PE. Patient is moderate risk with HEART score and Wells criteria. Increased risk for PE comes from immobilization and suspicious history. CT results revealed no PE or thoracic aortic dissection, but reveal an enlarging left suprahilar mass suggesting recurrent neoplasm. Patient will be discharged with instructions to follow-up with his oncologist as soon as possible. Results of the CT were delivered to the patient, who voiced understanding. At this point highly suspect patient has pericarditis, but due to his single kidney cannot be treated with most NSAIDs. Plan to contact cardiology for consult on management of the pericarditis. Patient states that his pain is still controlled with his by mouth oxycodone. 9:35 PM Dr. Winfred Leeds spoke with cardiologist on call who advised to treat patient with colchicine, with a loading dose of 1.2 mg here in the ED, discharged with prescription for 0.6 mg daily before bed for 2 weeks, and follow-up with [facility]. These details and plan of care communicated with the patient and the patient's wife at the bedside, both parties agreed to the plan and are comfortable with discharge.  Lorayne Bender, PA-C 11/21/14 2145  Orlie Dakin, MD 11/22/14 0003

## 2014-11-21 NOTE — ED Notes (Signed)
Pt refused vital signed just demands that IV be removed. To prevent debate with patient, IV was removed. Medication was administered.

## 2014-11-21 NOTE — Addendum Note (Signed)
Addended by: Cherylynn Ridges on: 11/21/2014 01:18 PM   Modules accepted: Orders

## 2014-11-21 NOTE — ED Notes (Signed)
Pt, being sent by [facility], c/o severe chest pain w/ inspiration x 2 days.  O2 sat 97% RA. Chest x-ray clear.  WBC 11.6.  Hx of lung CA and chronic neck pain.  Last chemo 03/2012.

## 2014-11-21 NOTE — ED Notes (Signed)
Bed: WA02 Expected date:  Expected time:  Means of arrival:  Comments: Pt from [place]

## 2014-11-21 NOTE — Telephone Encounter (Signed)
Patient called stating that he's having chest pain/ SOB with inhalation since yesterday. Patient describes pain with occasional sharpness with no radiation to his shoulders/back. RN instructed patient to go the ER for further evaluation. Patient was also given central scheduling number if he wanted to push his CT chest appointment up. Patient verbalized understanding.

## 2014-11-21 NOTE — Assessment & Plan Note (Signed)
Patient is status post chemotherapy and radiation treatments completed in March 2014.  Patient has been undergoing yearly visits and restaging CTs recently.  Patient states that he developed acute onset dyspnea and pain with inspiration.  Sunday evening, 11/19/2014.  He denies any actual cardiac chest pain; and also denies any radiation of the pain.  He is also complaining of increased fatigue recently as well.  He denies any recent fevers or chills.  Labs obtained today reveal a WBC of 11.6, ANC 9.6, hemoglobin of 14.7, platelet count 333.  Please see further notes regarding the chief complaint today.  Patient is scheduled for labs and a restaging CT of the chest on 12/13/2014.  Patient is scheduled for follow-up visit with Dr. Julien Nordmann on 12/20/2014.

## 2014-11-21 NOTE — ED Notes (Signed)
Attempted to start IV on pt x2 with good blood return however vein blew with catheter advancing. Charge RN to attempt IV start.

## 2014-11-21 NOTE — ED Notes (Signed)
Pt asking to go home, would like IV taken out and  does not wish for consult. Dr. Leanne Lovely informed and spoke to patient at bedside.

## 2014-11-21 NOTE — Assessment & Plan Note (Signed)
Patient states that he developed acute onset dyspnea and pain with inspiration.  Sunday evening, 11/19/2014.  He denies any actual cardiac chest pain; and also denies any radiation of the pain.  He is also complaining of increased fatigue recently as well.  He denies any recent fevers or chills.  Exam reveals good breath sounds bilaterally with no cough or wheeze.  No obvious shortness of breath on exam.  Patient does appear fatigued and slightly weak.  Labs obtained today reveal a WBC of 11.6, ANC 9.6, hemoglobin of 14.7, platelet count 333.  Chest x-ray obtained today reveal no acute abnormalities.  Given patient's slightly elevated white count, acute onset shortness of breath, and pain with inspiration.-Will transport patient to the emergency department for further evaluation and management.  Brief history report were given to the emergency department charge nurse prior to transport patient to the emergency department via wheelchair.  Per the cancer Center nurse.  Differential diagnosis includes: Pneumonia, pulmonary embolism, cardiac chest pain, and progression of disease.

## 2014-11-22 ENCOUNTER — Ambulatory Visit (HOSPITAL_COMMUNITY): Payer: Commercial Managed Care - PPO

## 2014-11-22 ENCOUNTER — Telehealth: Payer: Self-pay | Admitting: Internal Medicine

## 2014-11-22 NOTE — Telephone Encounter (Signed)
s.w pt and confirm cx ct due to already done in hospital....pt ok and aware....pt requested to do lab and MD same day....done.Marland KitchenMarland KitchenMarland KitchenMarland Kitchenpt ok and aware of everything

## 2014-11-23 ENCOUNTER — Other Ambulatory Visit (HOSPITAL_COMMUNITY): Payer: Commercial Managed Care - PPO

## 2014-12-13 ENCOUNTER — Ambulatory Visit (HOSPITAL_COMMUNITY): Payer: Commercial Managed Care - PPO

## 2014-12-13 ENCOUNTER — Other Ambulatory Visit: Payer: Commercial Managed Care - PPO

## 2014-12-13 ENCOUNTER — Other Ambulatory Visit: Payer: Medicare Other

## 2014-12-14 DIAGNOSIS — J209 Acute bronchitis, unspecified: Secondary | ICD-10-CM | POA: Diagnosis not present

## 2014-12-20 ENCOUNTER — Ambulatory Visit (HOSPITAL_BASED_OUTPATIENT_CLINIC_OR_DEPARTMENT_OTHER): Payer: Medicare Other | Admitting: Internal Medicine

## 2014-12-20 ENCOUNTER — Encounter: Payer: Self-pay | Admitting: Internal Medicine

## 2014-12-20 ENCOUNTER — Telehealth: Payer: Self-pay | Admitting: Internal Medicine

## 2014-12-20 ENCOUNTER — Other Ambulatory Visit (HOSPITAL_BASED_OUTPATIENT_CLINIC_OR_DEPARTMENT_OTHER): Payer: Medicare Other

## 2014-12-20 ENCOUNTER — Ambulatory Visit: Payer: Commercial Managed Care - PPO | Admitting: Internal Medicine

## 2014-12-20 VITALS — BP 86/62 | HR 73 | Temp 98.5°F | Resp 17 | Ht 69.0 in | Wt 157.3 lb

## 2014-12-20 DIAGNOSIS — C3492 Malignant neoplasm of unspecified part of left bronchus or lung: Secondary | ICD-10-CM

## 2014-12-20 DIAGNOSIS — C3412 Malignant neoplasm of upper lobe, left bronchus or lung: Secondary | ICD-10-CM | POA: Diagnosis not present

## 2014-12-20 DIAGNOSIS — Z85528 Personal history of other malignant neoplasm of kidney: Secondary | ICD-10-CM

## 2014-12-20 DIAGNOSIS — N2889 Other specified disorders of kidney and ureter: Secondary | ICD-10-CM

## 2014-12-20 LAB — CBC WITH DIFFERENTIAL/PLATELET
BASO%: 1 % (ref 0.0–2.0)
BASOS ABS: 0.1 10*3/uL (ref 0.0–0.1)
EOS ABS: 0.3 10*3/uL (ref 0.0–0.5)
EOS%: 2.3 % (ref 0.0–7.0)
HEMATOCRIT: 42.9 % (ref 38.4–49.9)
HEMOGLOBIN: 13.6 g/dL (ref 13.0–17.1)
LYMPH#: 1.5 10*3/uL (ref 0.9–3.3)
LYMPH%: 10.8 % — ABNORMAL LOW (ref 14.0–49.0)
MCH: 27.3 pg (ref 27.2–33.4)
MCHC: 31.8 g/dL — ABNORMAL LOW (ref 32.0–36.0)
MCV: 86.1 fL (ref 79.3–98.0)
MONO#: 0.8 10*3/uL (ref 0.1–0.9)
MONO%: 5.9 % (ref 0.0–14.0)
NEUT%: 80 % — ABNORMAL HIGH (ref 39.0–75.0)
NEUTROS ABS: 10.8 10*3/uL — AB (ref 1.5–6.5)
Platelets: 367 10*3/uL (ref 140–400)
RBC: 4.98 10*6/uL (ref 4.20–5.82)
RDW: 16.8 % — AB (ref 11.0–14.6)
WBC: 13.5 10*3/uL — AB (ref 4.0–10.3)

## 2014-12-20 LAB — COMPREHENSIVE METABOLIC PANEL
ALBUMIN: 3.3 g/dL — AB (ref 3.5–5.0)
ALK PHOS: 87 U/L (ref 40–150)
ALT: 11 U/L (ref 0–55)
AST: 10 U/L (ref 5–34)
Anion Gap: 9 mEq/L (ref 3–11)
BUN: 20.2 mg/dL (ref 7.0–26.0)
CALCIUM: 9.3 mg/dL (ref 8.4–10.4)
CO2: 28 mEq/L (ref 22–29)
Chloride: 103 mEq/L (ref 98–109)
Creatinine: 1.3 mg/dL (ref 0.7–1.3)
EGFR: 60 mL/min/{1.73_m2} — AB (ref >90)
GLUCOSE: 87 mg/dL (ref 70–140)
Potassium: 4.7 mEq/L (ref 3.5–5.1)
SODIUM: 140 meq/L (ref 136–145)
TOTAL PROTEIN: 6.4 g/dL (ref 6.4–8.3)

## 2014-12-20 NOTE — Telephone Encounter (Signed)
per pof to sch pt appt-gave pt copy of avs °

## 2014-12-20 NOTE — Progress Notes (Signed)
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 2) Status post left laparoscopic radical nephrectomy under the care of Dr. Louis Meckel on 04/04/2013.  CURRENT THERAPY: Observation.  CHEMOTHERAPY INTENT: Curative  CURRENT # OF CHEMOTHERAPY CYCLES: 0  CURRENT ANTIEMETICS: Zofran, Compazine and Decadron  CURRENT SMOKING STATUS: Current smoker. Smoke cessation consult was done.  ORAL CHEMOTHERAPY AND CONSENT: None  CURRENT BISPHOSPHONATES USE: None  PAIN MANAGEMENT: 0/10  NARCOTICS INDUCED CONSTIPATION: None  LIVING WILL AND CODE STATUS: Full code  INTERVAL HISTORY: [name] 64 y.o. male returns to the clinic today for four-month followup visit accompanied by his wife. He is feeling fine with no specific complaints. He was recently treated for pericarditis after the patient presented with chest pain. During his evaluation he had CT angiogram of the chest performed at [facility] emergency department that showed some evidence for disease progression and the patient is here for evaluation and discussion of his treatment options. The patient denied having any significant chest pain, but has shortness of breath with exertion with no cough or hemoptysis. He denied having any fever or chills. He denied having any significant weight loss or night sweats.  MEDICAL HISTORY: Past Medical History  Diagnosis  Date  . Cro[place]'s disease ([place]   . Renal mass   . Renal mass, left   . Difficulty sleeping     occasionally  . 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 Cancer of kidney ([place])     left  . Lung cancer ([place])     ALLERGIES:  has No Known Allergies.  MEDICATIONS:  Current Outpatient Prescriptions  Medication Sig Dispense Refill  . acetaminophen (TYLENOL) 500 MG tablet Take 1,000 mg by mouth every 6 (six) hours as needed for mild pain, moderate pain, fever or headache.    . Cholecalciferol (VITAMIN D-3 PO) Take 5,000 Units by mouth daily.     . colchicine 0.6 MG tablet Take 1 tablet (0.6 mg total) by mouth at bedtime. 14 tablet 0  . Menaquinone-7 (VITAMIN K2 PO) Take 150 mcg by mouth daily.    Marland Kitchen oxyCODONE (ROXICODONE) 15 MG immediate release tablet Take 15 mg by mouth every 8 (eight) hours as needed for pain (1-2 tid prn pain).    . Probiotic Product (PROBIOTIC DAILY PO) Take 1 capsule by mouth daily.    . TURMERIC CURCUMIN PO Take 1 tablet by mouth daily.     . vitamin E 200 UNIT capsule Take 200 Units by mouth daily.      No current facility-administered medications for this visit.    SURGICAL HISTORY:  Past Surgical History  Procedure Laterality Date  . Testicular removal  2000  . Appendectomy    . Video bronchoscopy N/A 11/30/2012    Procedure: VIDEO BRONCHOSCOPY WITH FLUORO;  Surgeon: Elsie Stain, MD;  Location: [facility];  Service: Cardiopulmonary;  Laterality: N/A;  . Hernia repair  x 2  . Laparoscopic nephrectomy Left 04/04/2013    Procedure: LAPAROSCOPIC LEFT RADICAL NEPHRECTOMY ;  Surgeon: [name], MD;  Location: [facility];  Service: Urology;  Laterality: Left;    REVIEW OF SYSTEMS:  Constitutional: negative Eyes: negative Ears, nose, mouth, throat, and face: negative Respiratory: positive for dyspnea on exertion Cardiovascular: negative Gastrointestinal: negative Genitourinary:negative Integument/breast:  negative Hematologic/lymphatic: negative Musculoskeletal:negative Neurological: negative Behavioral/Psych: negative Endocrine: negative Allergic/Immunologic: negative   PHYSICAL EXAMINATION: General appearance: alert, cooperative, fatigued and no distress Head: Normocephalic, without obvious abnormality, atraumatic Neck: no adenopathy, no JVD, supple, symmetrical, trachea midline and thyroid not enlarged, symmetric, no tenderness/mass/nodules Lymph nodes: Cervical, supraclavicular, and axillary nodes normal. Resp: clear to auscultation bilaterally Back: symmetric, no curvature. ROM normal. No CVA tenderness. Cardio: regular rate and rhythm, S1, S2 normal, no murmur, click, rub or gallop GI: soft, non-tender; bowel sounds normal; no masses,  no organomegaly Extremities: extremities normal, atraumatic, no cyanosis or edema Neurologic: Alert and oriented X 3, normal strength and tone. Normal symmetric reflexes. Normal coordination and gait  ECOG PERFORMANCE STATUS: 1 - Symptomatic but completely ambulatory  There were no vitals taken for this visit.  LABORATORY DATA: Lab Results  Component Value Date   WBC 13.5* 12/20/2014   HGB 13.6 12/20/2014   HCT 42.9 12/20/2014   MCV 86.1 12/20/2014   PLT 367 12/20/2014      Chemistry      Component Value Date/Time   NA 137 11/21/2014 1455   NA 135* 04/05/2013 0348   K 5.0 11/21/2014 1455   K 4.4 04/05/2013 0348   CL 99 04/05/2013 0348   CO2 28 11/21/2014 1455   CO2 28 04/05/2013 0348   BUN 12.0 11/21/2014 1455   BUN 10 04/05/2013 0348   CREATININE 1.3 11/21/2014 1455   CREATININE 1.13 04/05/2013 0348      Component Value Date/Time   CALCIUM 9.9 11/21/2014 1455   CALCIUM 9.0 04/05/2013 0348   ALKPHOS 95 11/21/2014 1455   ALKPHOS 106 03/30/2013 1350   AST 7 11/21/2014 1455   AST 9 03/30/2013 1350   ALT <9 11/21/2014 1455   ALT 6 03/30/2013 1350   BILITOT 0.71 11/21/2014 1455   BILITOT 0.3 03/30/2013 1350        RADIOGRAPHIC STUDIES: Dg Chest 2 View  11/21/2014  CLINICAL DATA:  Left upper lobe lung cancer. Pleuritic shortness of breath and chest pain. EXAM: CHEST  2 VIEW COMPARISON:  06/13/2014 chest CT and 11/15/2012 chest radiograph. FINDINGS: Stable cardiomediastinal silhouette with normal heart size. No pneumothorax. No pleural effusion. There is stable distortion and curvilinear opacity in the parahilar left upper lobe. No pulmonary edema. No new focal lung opacity. IMPRESSION: Stable post treatment change in the parahilar left upper lobe. No acute cardiopulmonary disease. Electronically Signed   By: Ilona Sorrel M.D.   On: 11/21/2014 16:34   Ct Angio Chest Pe W/cm &/or Wo Cm  11/21/2014  CLINICAL DATA:  Chronic immobilization, shortness of Breath, chest pain EXAM: CT ANGIOGRAPHY CHEST WITH CONTRAST TECHNIQUE: Multidetector CT imaging of the chest was performed using the standard protocol during bolus administration of intravenous contrast. Multiplanar CT image reconstructions and MIPs were obtained to evaluate the vascular anatomy. CONTRAST:  127m OMNIPAQUE IOHEXOL 350 MG/ML SOLN COMPARISON:  06/13/2014 FINDINGS: Right arm IV contrast injection. SVC is patent. Mild contrast reflux from the right atrium into the intrahepatic IVC. Satisfactory opacification of pulmonary arteries noted, and there is no evidence of pulmonary emboli. Occluded superior left  pulmonary vein as before. Patchy coronary calcifications. Adequate contrast opacification of the thoracic aorta with no evidence of dissection, aneurysm, or stenosis. There is classic 3-vessel brachiocephalic arch anatomy without proximal stenosis. Patchy calcifications in the arch and descending aorta. No pleural or pericardial effusion. 19 mm left suprahilar mass or adenopathy, increased since prior study of 02/07/2014. There is interval narrowing of the left upper lobe bronchus. Adjacent linear scarring in the left upper lobe. Calcified right hilar and  subcarinal lymph nodes. Visualized portions of upper abdomen unremarkable. Review of the MIP images confirms the above findings. IMPRESSION: 1. Negative for acute PE or thoracic aortic dissection. 2. Enlarging left suprahilar mass or adenopathy suggesting recurrent neoplasm. Electronically Signed   By: Lucrezia Europe M.D.   On: 11/21/2014 19:26    ASSESSMENT AND PLAN: This is a very pleasant 64 years old white male recently diagnosed with a stage IIIA non-small cell lung cancer currently undergoing concurrent chemoradiation with weekly carboplatin and paclitaxel status post 7 cycles.  The patient has been observation since January 2015. The recent CT scan of the chest showed enlarging left suprahilar mass or adenopathy suggesting recurrent neoplasm. I discussed the scan results with the patient and his wife and gave him the option of proceeding with systemic chemotherapy versus close monitoring and repeat CT scan of the chest in 2 months for further evaluation of this lesion. The patient and his wife would like to continue on observation for now as the patient is currently asymptomatic. I will see him back for follow-up visit in 2 months with repeat CT scan of the chest but the patient was advised to call immediately if he has any concerning symptoms in the interval she any significant chest pain, shortness breath or hemoptysis. He was also encouraged to quit smoking. He was advised to call immediately if he has any concerning symptoms in the interval.  Disclaimer: This note was dictated with voice recognition software. Similar sounding words can inadvertently be transcribed and may not be corrected upon review.

## 2015-02-16 ENCOUNTER — Encounter (HOSPITAL_COMMUNITY): Payer: Self-pay

## 2015-02-16 ENCOUNTER — Other Ambulatory Visit (HOSPITAL_BASED_OUTPATIENT_CLINIC_OR_DEPARTMENT_OTHER): Payer: Medicare Other

## 2015-02-16 ENCOUNTER — Ambulatory Visit (HOSPITAL_COMMUNITY)
Admission: RE | Admit: 2015-02-16 | Discharge: 2015-02-16 | Disposition: A | Discharge status: Home / Self Care | Payer: Medicare Other | Source: Ambulatory Visit | Attending: Internal Medicine | Admitting: Internal Medicine

## 2015-02-16 DIAGNOSIS — C3492 Malignant neoplasm of unspecified part of left bronchus or lung: Secondary | ICD-10-CM | POA: Insufficient documentation

## 2015-02-16 DIAGNOSIS — Z9079 Acquired absence of other genital organ(s): Secondary | ICD-10-CM | POA: Insufficient documentation

## 2015-02-16 DIAGNOSIS — N2889 Other specified disorders of kidney and ureter: Secondary | ICD-10-CM | POA: Diagnosis present

## 2015-02-16 DIAGNOSIS — C3412 Malignant neoplasm of upper lobe, left bronchus or lung: Secondary | ICD-10-CM | POA: Diagnosis not present

## 2015-02-16 DIAGNOSIS — I2510 Atherosclerotic heart disease of native coronary artery without angina pectoris: Secondary | ICD-10-CM | POA: Insufficient documentation

## 2015-02-16 DIAGNOSIS — Z923 Personal history of irradiation: Secondary | ICD-10-CM | POA: Insufficient documentation

## 2015-02-16 DIAGNOSIS — Z8547 Personal history of malignant neoplasm of testis: Secondary | ICD-10-CM | POA: Insufficient documentation

## 2015-02-16 LAB — CBC WITH DIFFERENTIAL/PLATELET
BASO%: 1 % (ref 0.0–2.0)
Basophils Absolute: 0.1 10*3/uL (ref 0.0–0.1)
EOS%: 1.7 % (ref 0.0–7.0)
Eosinophils Absolute: 0.2 10*3/uL (ref 0.0–0.5)
HEMATOCRIT: 45 % (ref 38.4–49.9)
HEMOGLOBIN: 14.6 g/dL (ref 13.0–17.1)
LYMPH#: 1.3 10*3/uL (ref 0.9–3.3)
LYMPH%: 13.9 % — ABNORMAL LOW (ref 14.0–49.0)
MCH: 27.6 pg (ref 27.2–33.4)
MCHC: 32.4 g/dL (ref 32.0–36.0)
MCV: 85.1 fL (ref 79.3–98.0)
MONO#: 0.7 10*3/uL (ref 0.1–0.9)
MONO%: 7.5 % (ref 0.0–14.0)
NEUT%: 75.9 % — ABNORMAL HIGH (ref 39.0–75.0)
NEUTROS ABS: 7.3 10*3/uL — AB (ref 1.5–6.5)
Platelets: 391 10*3/uL (ref 140–400)
RBC: 5.29 10*6/uL (ref 4.20–5.82)
RDW: 15.7 % — AB (ref 11.0–14.6)
WBC: 9.6 10*3/uL (ref 4.0–10.3)

## 2015-02-16 LAB — COMPREHENSIVE METABOLIC PANEL
ALBUMIN: 3.3 g/dL — AB (ref 3.5–5.0)
ALK PHOS: 107 U/L (ref 40–150)
ALT: <9 U/L (ref 0–55)
ANION GAP: 10 meq/L (ref 3–11)
AST: 12 U/L (ref 5–34)
BILIRUBIN TOTAL: 0.36 mg/dL (ref 0.20–1.20)
BUN: 16.2 mg/dL (ref 7.0–26.0)
CO2: 27 mEq/L (ref 22–29)
Calcium: 10.1 mg/dL (ref 8.4–10.4)
Chloride: 100 mEq/L (ref 98–109)
Creatinine: 1.4 mg/dL — ABNORMAL HIGH (ref 0.7–1.3)
EGFR: 51 mL/min/{1.73_m2} — AB (ref >90)
Glucose: 116 mg/dl (ref 70–140)
Potassium: 5 mEq/L (ref 3.5–5.1)
Sodium: 138 mEq/L (ref 136–145)
TOTAL PROTEIN: 7.4 g/dL (ref 6.4–8.3)

## 2015-02-20 ENCOUNTER — Encounter: Payer: Self-pay | Admitting: Internal Medicine

## 2015-02-20 ENCOUNTER — Ambulatory Visit (HOSPITAL_BASED_OUTPATIENT_CLINIC_OR_DEPARTMENT_OTHER): Payer: Medicare Other | Admitting: Internal Medicine

## 2015-02-20 ENCOUNTER — Telehealth: Payer: Self-pay | Admitting: Internal Medicine

## 2015-02-20 VITALS — BP 120/77 | HR 90 | Temp 98.1°F | Resp 17 | Ht 69.0 in | Wt 153.4 lb

## 2015-02-20 DIAGNOSIS — C3492 Malignant neoplasm of unspecified part of left bronchus or lung: Secondary | ICD-10-CM

## 2015-02-20 DIAGNOSIS — Z85528 Personal history of other malignant neoplasm of kidney: Secondary | ICD-10-CM | POA: Diagnosis not present

## 2015-02-20 DIAGNOSIS — Z85118 Personal history of other malignant neoplasm of bronchus and lung: Secondary | ICD-10-CM | POA: Diagnosis not present

## 2015-02-20 DIAGNOSIS — C642 Malignant neoplasm of left kidney, except renal pelvis: Secondary | ICD-10-CM

## 2015-02-20 HISTORY — DX: Malignant neoplasm of left kidney, except renal pelvis: C64.2

## 2015-02-20 NOTE — Progress Notes (Signed)
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 2) Status post left laparoscopic radical nephrectomy under the care of Dr. Louis Meckel on 04/04/2013.  CURRENT THERAPY: Observation.  CHEMOTHERAPY INTENT: Curative  CURRENT # OF CHEMOTHERAPY CYCLES: 0  CURRENT ANTIEMETICS: Zofran, Compazine and Decadron  CURRENT SMOKING STATUS: Current smoker. Smoke cessation consult was done.  ORAL CHEMOTHERAPY AND CONSENT: None  CURRENT BISPHOSPHONATES USE: None  PAIN MANAGEMENT: 0/10  NARCOTICS INDUCED CONSTIPATION: None  LIVING WILL AND CODE STATUS: Full code  INTERVAL HISTORY: [name] 65 y.o. male returns to the clinic today for four-month followup visit accompanied by his wife. He is feeling fine with no specific complaints except for mild fatigue and feeling cold most of the time. He is not very active.. The patient denied having any significant chest pain, but has shortness of breath with exertion with no cough or hemoptysis. He denied having any fever or chills. He denied having any significant weight loss or night sweats. He had repeat CT scan of the chest performed recently and is here for evaluation and discussion of his scan results.  MEDICAL HISTORY: Past Medical History  Diagnosis Date  . Crohn's disease ([place])   . Renal mass   . Renal mass, left   . Difficulty sleeping     occasionally  .  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 Cancer of kidney ([place])     left  . Lung cancer ([place])     ALLERGIES:  has No Known Allergies.  MEDICATIONS:  Current Outpatient Prescriptions  Medication Sig Dispense Refill  . Cholecalciferol (VITAMIN D-3 PO) Take 5,000 Units by mouth daily.     . colchicine 0.6 MG tablet Take 1 tablet (0.6 mg total) by mouth at bedtime. 14 tablet 0  . Menaquinone-7 (VITAMIN K2 PO) Take 150 mcg by mouth daily.    . Misc Natural Products (ARTEMISIA PO) Take 400 mg by mouth.    . oxyCODONE (ROXICODONE) 15 MG immediate release tablet Take 15 mg by mouth every 8 (eight) hours as needed for pain (1-2 tid prn pain).    . Probiotic Product (PROBIOTIC DAILY PO) Take 1 capsule by mouth daily.    . TURMERIC CURCUMIN PO Take 1 tablet by mouth daily.     . vitamin E 200 UNIT capsule Take 200 Units by mouth daily.      No current facility-administered medications for this visit.    SURGICAL HISTORY:  Past Surgical History  Procedure Laterality Date  . Testicular removal  2000  . Appendectomy    . Video bronchoscopy N/A 11/30/2012    Procedure: VIDEO BRONCHOSCOPY WITH FLUORO;  Surgeon: Elsie Stain, MD;  Location: [facility];  Service: Cardiopulmonary;  Laterality: N/A;  . Hernia repair      x 2  . Laparoscopic nephrectomy Left 04/04/2013    Procedure: LAPAROSCOPIC LEFT RADICAL NEPHRECTOMY ;  Surgeon: [name], MD;  Location: [facility];  Service: Urology;  Laterality: Left;    REVIEW OF SYSTEMS:  A comprehensive review of systems was negative except for: Constitutional: positive for fatigue Respiratory: positive for dyspnea on exertion   PHYSICAL EXAMINATION: General appearance: alert, cooperative, fatigued and no distress Head: Normocephalic, without obvious abnormality, atraumatic Neck: no adenopathy, no JVD, supple, symmetrical, trachea midline and thyroid not enlarged, symmetric, no tenderness/mass/nodules Lymph nodes:  Cervical, supraclavicular, and axillary nodes normal. Resp: clear to auscultation bilaterally Back: symmetric, no curvature. ROM normal. No CVA tenderness. Cardio: regular rate and rhythm, S1, S2 normal, no murmur, click, rub or gallop GI: soft, non-tender; bowel sounds normal; no masses,  no organomegaly Extremities: extremities normal, atraumatic, no cyanosis or edema Neurologic: Alert and oriented X 3, normal strength and tone. Normal symmetric reflexes. Normal coordination and gait  ECOG PERFORMANCE STATUS: 1 - Symptomatic but completely ambulatory  Blood pressure 120/77, pulse 90, temperature 98.1 F (36.7 C), temperature source Oral, resp. rate 17, height 5' 9"  (1.753 m), weight 153 lb 6.4 oz (69.582 kg), SpO2 95 %.  LABORATORY DATA: Lab Results  Component Value Date   WBC 9.6 02/16/2015   HGB 14.6 02/16/2015   HCT 45.0 02/16/2015   MCV 85.1 02/16/2015   PLT 391 02/16/2015      Chemistry      Component Value Date/Time   NA 138 02/16/2015 1423   NA 135* 04/05/2013 0348   K 5.0 02/16/2015 1423   K 4.4 04/05/2013 0348   CL 99 04/05/2013 0348   CO2 27 02/16/2015 1423   CO2 28 04/05/2013 0348   BUN 16.2 02/16/2015 1423   BUN 10 04/05/2013 0348   CREATININE 1.4* 02/16/2015 1423   CREATININE 1.13 04/05/2013 0348      Component Value Date/Time   CALCIUM 10.1 02/16/2015 1423   CALCIUM 9.0 04/05/2013 0348   ALKPHOS 107 02/16/2015 1423   ALKPHOS 106 03/30/2013 1350   AST 12 02/16/2015 1423   AST 9 03/30/2013 1350   ALT <9 02/16/2015 1423   ALT 6 03/30/2013 1350   BILITOT 0.36 02/16/2015 1423   BILITOT 0.3 03/30/2013 1350       RADIOGRAPHIC STUDIES: Ct Chest Wo Contrast  02/16/2015  CLINICAL DATA:  65 year old male with history of left-sided lung cancer diagnosed in January 2015 status post radiation therapy, now complete. Additional remote history of testicular cancer status post orchiectomy diagnosed in 2000 and left-sided renal cancer diagnosed in 2014 status post  left nephrectomy. EXAM: CT CHEST WITHOUT CONTRAST TECHNIQUE: Multidetector CT imaging of the chest was performed following the standard protocol without IV contrast. COMPARISON:  Chest CT 11/21/2014.  Multiple other priors. FINDINGS: Mediastinum/Lymph Nodes: Heart size is normal. There is no significant pericardial fluid, thickening or pericardial calcification. There is atherosclerosis of the thoracic aorta, the great vessels of the mediastinum and the coronary arteries, including calcified atherosclerotic plaque in the left main, left anterior descending and left circumflex coronary arteries. Calcifications of the aortic valve. There continues to be some prominent soft tissue in and around the left hilar region, which is poorly evaluated on today's noncontrast CT examination but appears generally similar to the prior study. No other potential lymphadenopathy noted elsewhere in the mediastinum or right hilar region. Densely calcified right hilar and subcarinal lymph nodes are again noted. Esophagus is normal in appearance. No axillary lymphadenopathy. Lungs/Pleura: Chronic areas of architectural distortion, ground-glass attenuation and septal thickening are noted in the perihilar aspect of the left upper lobe with some extension  outward toward the apex. This is very similar to prior study 11/21/2014, compatible with evolving postradiation changes. Given the central location of this lesion, accurate assessment for increasing soft tissue is very limited in the lack of IV contrast on today's examination, but no definite recurrent nodule or mass is confidently identified) beyond the persistent soft tissue fullness in the left hilar region). No other new suspicious appearing pulmonary nodules or masses are noted elsewhere. No acute consolidative airspace disease. No pleural effusions. Upper Abdomen: Status post left nephrectomy.  Atherosclerosis. Musculoskeletal/Soft Tissues: There are no aggressive appearing lytic or  blastic lesions noted in the visualized portions of the skeleton. IMPRESSION: 1. Within the limitations of today's noncontrast CT examination, post treatment changes in the thorax appear stable, and there is no definite evidence to suggest recurrent disease. 2. Atherosclerosis, including left main and 2 vessel coronary artery disease. Please note that although the presence of coronary artery calcium documents the presence of coronary artery disease, the severity of this disease and any potential stenosis cannot be assessed on this non-gated CT examination. Assessment for potential risk factor modification, dietary therapy or pharmacologic therapy may be warranted, if clinically indicated. 3. Additional incidental findings, as above. Electronically Signed   By: Vinnie Langton M.D.   On: 02/16/2015 16:50    ASSESSMENT AND PLAN: This is a very pleasant 65 years old white male recently diagnosed with a stage IIIA non-small cell lung cancer currently undergoing concurrent chemoradiation with weekly carboplatin and paclitaxel status post 7 cycles.  The patient has been observation since January 2015. The CT scan of the chest performed recently showed no clear evidence for disease recurrence. I discussed the scan results with the patient and his wife. I recommended for him to continue on observation with repeat CT scan of the chest without contrast in 4 months. The patient was advised to call immediately if he has any concerning symptoms in the interval. He was also encouraged to quit smoking. He was advised to call immediately if he has any concerning symptoms in the interval.  Disclaimer: This note was dictated with voice recognition software. Similar sounding words can inadvertently be transcribed and may not be corrected upon review.

## 2015-02-20 NOTE — Telephone Encounter (Signed)
Pt confirmed labs/ov per 02/14 POF, gave pt AVS and Calendar... KJ

## 2015-06-21 ENCOUNTER — Encounter (HOSPITAL_COMMUNITY): Payer: Self-pay

## 2015-06-21 ENCOUNTER — Ambulatory Visit (HOSPITAL_COMMUNITY)
Admission: RE | Admit: 2015-06-21 | Discharge: 2015-06-21 | Disposition: A | Discharge status: Home / Self Care | Payer: Medicare Other | Source: Ambulatory Visit | Attending: Internal Medicine | Admitting: Internal Medicine

## 2015-06-21 ENCOUNTER — Other Ambulatory Visit (HOSPITAL_BASED_OUTPATIENT_CLINIC_OR_DEPARTMENT_OTHER): Payer: Medicare Other

## 2015-06-21 DIAGNOSIS — Z923 Personal history of irradiation: Secondary | ICD-10-CM | POA: Insufficient documentation

## 2015-06-21 DIAGNOSIS — Z85118 Personal history of other malignant neoplasm of bronchus and lung: Secondary | ICD-10-CM | POA: Diagnosis not present

## 2015-06-21 DIAGNOSIS — C3492 Malignant neoplasm of unspecified part of left bronchus or lung: Secondary | ICD-10-CM

## 2015-06-21 DIAGNOSIS — Y842 Radiological procedure and radiotherapy as the cause of abnormal reaction of the patient, or of later complication, without mention of misadventure at the time of the procedure: Secondary | ICD-10-CM | POA: Diagnosis not present

## 2015-06-21 DIAGNOSIS — C642 Malignant neoplasm of left kidney, except renal pelvis: Secondary | ICD-10-CM | POA: Diagnosis present

## 2015-06-21 DIAGNOSIS — Z905 Acquired absence of kidney: Secondary | ICD-10-CM | POA: Diagnosis not present

## 2015-06-21 DIAGNOSIS — Z85528 Personal history of other malignant neoplasm of kidney: Secondary | ICD-10-CM | POA: Diagnosis not present

## 2015-06-21 LAB — COMPREHENSIVE METABOLIC PANEL
ALT: 10 U/L (ref 0–55)
ANION GAP: 10 meq/L (ref 3–11)
AST: 13 U/L (ref 5–34)
Albumin: 3.3 g/dL — ABNORMAL LOW (ref 3.5–5.0)
Alkaline Phosphatase: 99 U/L (ref 40–150)
BILIRUBIN TOTAL: 0.39 mg/dL (ref 0.20–1.20)
BUN: 14.4 mg/dL (ref 7.0–26.0)
CHLORIDE: 102 meq/L (ref 98–109)
CO2: 27 meq/L (ref 22–29)
CREATININE: 1.3 mg/dL (ref 0.7–1.3)
Calcium: 9.8 mg/dL (ref 8.4–10.4)
EGFR: 58 mL/min/{1.73_m2} — ABNORMAL LOW (ref >90)
GLUCOSE: 103 mg/dL (ref 70–140)
Potassium: 5.3 mEq/L — ABNORMAL HIGH (ref 3.5–5.1)
SODIUM: 139 meq/L (ref 136–145)
TOTAL PROTEIN: 7.4 g/dL (ref 6.4–8.3)

## 2015-06-21 LAB — CBC WITH DIFFERENTIAL/PLATELET
BASO%: 0.8 % (ref 0.0–2.0)
Basophils Absolute: 0.1 10*3/uL (ref 0.0–0.1)
EOS%: 0.9 % (ref 0.0–7.0)
Eosinophils Absolute: 0.1 10*3/uL (ref 0.0–0.5)
HCT: 44.7 % (ref 38.4–49.9)
HGB: 14.6 g/dL (ref 13.0–17.1)
LYMPH%: 10.6 % — AB (ref 14.0–49.0)
MCH: 28 pg (ref 27.2–33.4)
MCHC: 32.6 g/dL (ref 32.0–36.0)
MCV: 85.7 fL (ref 79.3–98.0)
MONO#: 0.6 10*3/uL (ref 0.1–0.9)
MONO%: 5.4 % (ref 0.0–14.0)
NEUT%: 82.3 % — ABNORMAL HIGH (ref 39.0–75.0)
NEUTROS ABS: 8.7 10*3/uL — AB (ref 1.5–6.5)
PLATELETS: 406 10*3/uL — AB (ref 140–400)
RBC: 5.21 10*6/uL (ref 4.20–5.82)
RDW: 15.3 % — ABNORMAL HIGH (ref 11.0–14.6)
WBC: 10.5 10*3/uL — AB (ref 4.0–10.3)
lymph#: 1.1 10*3/uL (ref 0.9–3.3)

## 2015-06-26 ENCOUNTER — Other Ambulatory Visit: Payer: Self-pay | Admitting: Medical Oncology

## 2015-06-27 ENCOUNTER — Telehealth: Payer: Self-pay | Admitting: Internal Medicine

## 2015-06-27 ENCOUNTER — Ambulatory Visit (HOSPITAL_BASED_OUTPATIENT_CLINIC_OR_DEPARTMENT_OTHER): Payer: Medicare Other | Admitting: Internal Medicine

## 2015-06-27 ENCOUNTER — Encounter: Payer: Self-pay | Admitting: Internal Medicine

## 2015-06-27 VITALS — BP 112/67 | HR 80 | Temp 98.2°F | Resp 18 | Ht 69.0 in | Wt 154.7 lb

## 2015-06-27 DIAGNOSIS — Z85528 Personal history of other malignant neoplasm of kidney: Secondary | ICD-10-CM

## 2015-06-27 DIAGNOSIS — C3492 Malignant neoplasm of unspecified part of left bronchus or lung: Secondary | ICD-10-CM

## 2015-06-27 DIAGNOSIS — C642 Malignant neoplasm of left kidney, except renal pelvis: Secondary | ICD-10-CM

## 2015-06-27 DIAGNOSIS — Z85118 Personal history of other malignant neoplasm of bronchus and lung: Secondary | ICD-10-CM | POA: Diagnosis not present

## 2015-06-27 NOTE — Progress Notes (Signed)
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 2) Status post left laparoscopic radical nephrectomy under the care of Dr. Louis Meckel on 04/04/2013.  CURRENT THERAPY: Observation.  CHEMOTHERAPY INTENT: Curative  CURRENT # OF CHEMOTHERAPY CYCLES: 0  CURRENT ANTIEMETICS: Zofran, Compazine and Decadron  CURRENT SMOKING STATUS: Current smoker. Smoke cessation consult was done.  ORAL CHEMOTHERAPY AND CONSENT: None  CURRENT BISPHOSPHONATES USE: None  PAIN MANAGEMENT: 0/10  NARCOTICS INDUCED CONSTIPATION: None  LIVING WILL AND CODE STATUS: Full code  INTERVAL HISTORY: [name] 65 y.o. male returns to the clinic today for four-month followup visit accompanied by his wife. He is feeling fine with no specific complaints except mild cough questionable for mild bronchitis. The patient denied having any significant chest pain, but has shortness of breath with exertion with no cough or hemoptysis. He denied having any fever or chills. He denied having any significant weight loss or night sweats. He had repeat CT scan of the chest performed recently and is here for evaluation and discussion of his scan results.  MEDICAL HISTORY: Past Medical History  Diagnosis Date  . Crohn's disease ([place])   . Renal mass   . Renal mass, left   . Difficulty sleeping     occasionally  . 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 Cancer of kidney ([place])     left  . Lung cancer ([place])   . Renal cell carcinoma of left kidney ([place]) 02/20/2015    ALLERGIES:  has No Known Allergies.  MEDICATIONS:  Current Outpatient Prescriptions  Medication Sig Dispense Refill  . Cholecalciferol (VITAMIN D-3 PO) Take 5,000 Units by mouth daily.     . colchicine 0.6 MG tablet Take 1 tablet (0.6 mg total) by mouth at bedtime. 14 tablet 0  . Menaquinone-7 (VITAMIN K2 PO) Take 150 mcg by mouth daily.    . Misc Natural Products (ARTEMISIA PO) Take 400 mg by mouth.    . oxyCODONE (ROXICODONE) 15 MG immediate release tablet Take 15 mg by mouth every 8 (eight) hours as needed for pain (1-2 tid prn pain).    . Probiotic Product (PROBIOTIC DAILY PO) Take 1 capsule by mouth daily.    . TURMERIC CURCUMIN PO Take 1 tablet by mouth daily.     . vitamin E 200 UNIT capsule Take 200 Units by mouth daily.      No current facility-administered medications for this visit.    SURGICAL HISTORY:  Past Surgical History  Procedure Laterality Date  . Testicular removal  2000  . Appendectomy    . Video bronchoscopy N/A 11/30/2012    Procedure: VIDEO BRONCHOSCOPY WITH FLUORO;  Surgeon: Elsie Stain, MD;  Location: [facility];  Service: Cardiopulmonary;  Laterality: N/A;  . Hernia repair      x 2  . Laparoscopic nephrectomy Left 04/04/2013    Procedure: LAPAROSCOPIC LEFT RADICAL NEPHRECTOMY ;  Surgeon: [name],  MD;  Location: [facility];  Service: Urology;  Laterality: Left;    REVIEW OF SYSTEMS:  A comprehensive review of systems was negative except for: Respiratory: positive for cough   PHYSICAL EXAMINATION: General appearance: alert, cooperative, fatigued and no distress Head: Normocephalic, without obvious abnormality, atraumatic Neck: no adenopathy, no JVD, supple, symmetrical, trachea midline and thyroid not enlarged, symmetric, no tenderness/mass/nodules Lymph nodes: Cervical, supraclavicular, and axillary  nodes normal. Resp: clear to auscultation bilaterally Back: symmetric, no curvature. ROM normal. No CVA tenderness. Cardio: regular rate and rhythm, S1, S2 normal, no murmur, click, rub or gallop GI: soft, non-tender; bowel sounds normal; no masses,  no organomegaly Extremities: extremities normal, atraumatic, no cyanosis or edema Neurologic: Alert and oriented X 3, normal strength and tone. Normal symmetric reflexes. Normal coordination and gait  ECOG PERFORMANCE STATUS: 1 - Symptomatic but completely ambulatory  Blood pressure 112/67, pulse 80, temperature 98.2 F (36.8 C), temperature source Oral, resp. rate 18, height 5' 9"  (1.753 m), weight 154 lb 11.2 oz (70.171 kg), SpO2 98 %.  LABORATORY DATA: Lab Results  Component Value Date   WBC 10.5* 06/21/2015   HGB 14.6 06/21/2015   HCT 44.7 06/21/2015   MCV 85.7 06/21/2015   PLT 406* 06/21/2015      Chemistry      Component Value Date/Time   NA 139 06/21/2015 1412   NA 135* 04/05/2013 0348   K 5.3 No visable hemolysis* 06/21/2015 1412   K 4.4 04/05/2013 0348   CL 99 04/05/2013 0348   CO2 27 06/21/2015 1412   CO2 28 04/05/2013 0348   BUN 14.4 06/21/2015 1412   BUN 10 04/05/2013 0348   CREATININE 1.3 06/21/2015 1412   CREATININE 1.13 04/05/2013 0348      Component Value Date/Time   CALCIUM 9.8 06/21/2015 1412   CALCIUM 9.0 04/05/2013 0348   ALKPHOS 99 06/21/2015 1412   ALKPHOS 106 03/30/2013 1350   AST 13 06/21/2015 1412   AST 9 03/30/2013 1350   ALT 10 06/21/2015 1412   ALT 6 03/30/2013 1350   BILITOT 0.39 06/21/2015 1412   BILITOT 0.3 03/30/2013 1350       RADIOGRAPHIC STUDIES: Ct Chest Wo Contrast  06/21/2015  CLINICAL DATA:  Left lung cancer, status post radiation therapy. Left renal cancer, status post left nephrectomy. Remote history of testicular cancer. Prior appendectomy. EXAM: CT CHEST WITHOUT CONTRAST TECHNIQUE: Multidetector CT imaging of the chest was performed following the standard protocol without  IV contrast. COMPARISON:  02/16/2015. FINDINGS: Mediastinum/Nodes: The heart is normal in size. No pericardial effusion. Coronary atherosclerosis in the LAD. Atherosclerotic calcifications in the aortic arch. Ectasia of the ascending thoracic aorta, measuring 3.8 cm. Calcified mediastinal and right perihilar lymph nodes. No suspicious mediastinal lymphadenopathy. Visualized thyroid is unremarkable. Lungs/Pleura: Radiation changes in the left upper lobe. Mild soft tissue prominence in the left perihilar region. No suspicious pulmonary nodules. No focal consolidation. No pleural effusion or pneumothorax. Upper abdomen: Visualized upper abdomen is notable for vascular calcifications and prior left nephrectomy. Musculoskeletal: Degenerative changes of the visualized thoracolumbar spine. IMPRESSION: Radiation changes in the left upper lobe. No evidence of recurrent or metastatic disease. Electronically Signed   By: Julian Hy M.D.   On: 06/21/2015 15:47    ASSESSMENT AND PLAN: This is a very pleasant 65 years old white male recently diagnosed with a stage IIIA non-small cell lung cancer currently undergoing concurrent chemoradiation with weekly carboplatin and paclitaxel status post 7 cycles.  The patient has been observation  since January 2015. The CT scan of the chest performed recently showed no evidence for disease recurrence. I discussed the scan results with the patient and his wife. I recommended for him to continue on observation with repeat CT scan of the chest without contrast in 6 months. The patient was advised to call immediately if he has any concerning symptoms in the interval.  He was advised to call immediately if he has any concerning symptoms in the interval.  Disclaimer: This note was dictated with voice recognition software. Similar sounding words can inadvertently be transcribed and may not be corrected upon review.

## 2015-06-27 NOTE — Telephone Encounter (Signed)
per pof to sch pt appt-gave pt copy of avs °

## 2015-06-27 NOTE — Addendum Note (Signed)
Addended by: Ardeen Gar[place] on: 06/27/2015 03:52 PM   Modules accepted: Orders, Medications

## 2015-12-24 ENCOUNTER — Ambulatory Visit (HOSPITAL_COMMUNITY)
Admission: RE | Admit: 2015-12-24 | Discharge: 2015-12-24 | Disposition: A | Discharge status: Home / Self Care | Payer: Medicare Other | Source: Ambulatory Visit | Attending: Internal Medicine | Admitting: Internal Medicine

## 2015-12-24 ENCOUNTER — Other Ambulatory Visit (HOSPITAL_BASED_OUTPATIENT_CLINIC_OR_DEPARTMENT_OTHER): Payer: Medicare Other

## 2015-12-24 DIAGNOSIS — C642 Malignant neoplasm of left kidney, except renal pelvis: Secondary | ICD-10-CM

## 2015-12-24 DIAGNOSIS — C3492 Malignant neoplasm of unspecified part of left bronchus or lung: Secondary | ICD-10-CM

## 2015-12-24 DIAGNOSIS — R918 Other nonspecific abnormal finding of lung field: Secondary | ICD-10-CM | POA: Insufficient documentation

## 2015-12-24 DIAGNOSIS — Z85528 Personal history of other malignant neoplasm of kidney: Secondary | ICD-10-CM

## 2015-12-24 DIAGNOSIS — Z85118 Personal history of other malignant neoplasm of bronchus and lung: Secondary | ICD-10-CM | POA: Diagnosis not present

## 2015-12-24 DIAGNOSIS — I700 Atherosclerosis of aorta: Secondary | ICD-10-CM | POA: Diagnosis not present

## 2015-12-24 DIAGNOSIS — I2510 Atherosclerotic heart disease of native coronary artery without angina pectoris: Secondary | ICD-10-CM | POA: Diagnosis not present

## 2015-12-24 LAB — CBC WITH DIFFERENTIAL/PLATELET
BASO%: 0.8 % (ref 0.0–2.0)
Basophils Absolute: 0.1 10*3/uL (ref 0.0–0.1)
EOS ABS: 0.2 10*3/uL (ref 0.0–0.5)
EOS%: 1.6 % (ref 0.0–7.0)
HCT: 46 % (ref 38.4–49.9)
HGB: 14.7 g/dL (ref 13.0–17.1)
LYMPH%: 15.7 % (ref 14.0–49.0)
MCH: 28 pg (ref 27.2–33.4)
MCHC: 32 g/dL (ref 32.0–36.0)
MCV: 87.6 fL (ref 79.3–98.0)
MONO#: 0.6 10*3/uL (ref 0.1–0.9)
MONO%: 6.5 % (ref 0.0–14.0)
NEUT%: 75.4 % — ABNORMAL HIGH (ref 39.0–75.0)
NEUTROS ABS: 6.9 10*3/uL — AB (ref 1.5–6.5)
Platelets: 359 10*3/uL (ref 140–400)
RBC: 5.26 10*6/uL (ref 4.20–5.82)
RDW: 15.2 % — ABNORMAL HIGH (ref 11.0–14.6)
WBC: 9.2 10*3/uL (ref 4.0–10.3)
lymph#: 1.4 10*3/uL (ref 0.9–3.3)

## 2015-12-24 LAB — COMPREHENSIVE METABOLIC PANEL
ALT: 10 U/L (ref 0–55)
AST: 11 U/L (ref 5–34)
Albumin: 3.2 g/dL — ABNORMAL LOW (ref 3.5–5.0)
Alkaline Phosphatase: 110 U/L (ref 40–150)
Anion Gap: 8 mEq/L (ref 3–11)
BUN: 19.3 mg/dL (ref 7.0–26.0)
CHLORIDE: 104 meq/L (ref 98–109)
CO2: 27 meq/L (ref 22–29)
Calcium: 9.7 mg/dL (ref 8.4–10.4)
Creatinine: 1.4 mg/dL — ABNORMAL HIGH (ref 0.7–1.3)
EGFR: 50 mL/min/{1.73_m2} — AB (ref >90)
GLUCOSE: 97 mg/dL (ref 70–140)
POTASSIUM: 5.1 meq/L (ref 3.5–5.1)
SODIUM: 139 meq/L (ref 136–145)
Total Bilirubin: 0.48 mg/dL (ref 0.20–1.20)
Total Protein: 7 g/dL (ref 6.4–8.3)

## 2016-01-01 ENCOUNTER — Other Ambulatory Visit: Payer: Self-pay | Admitting: Nurse Practitioner

## 2016-01-02 ENCOUNTER — Ambulatory Visit (HOSPITAL_BASED_OUTPATIENT_CLINIC_OR_DEPARTMENT_OTHER): Payer: Medicare Other | Admitting: Internal Medicine

## 2016-01-02 ENCOUNTER — Encounter: Payer: Self-pay | Admitting: Internal Medicine

## 2016-01-02 ENCOUNTER — Telehealth: Payer: Self-pay | Admitting: Internal Medicine

## 2016-01-02 VITALS — BP 121/63 | HR 103 | Temp 98.3°F | Resp 18 | Ht 69.0 in | Wt 159.3 lb

## 2016-01-02 DIAGNOSIS — Z85118 Personal history of other malignant neoplasm of bronchus and lung: Secondary | ICD-10-CM

## 2016-01-02 DIAGNOSIS — Z85528 Personal history of other malignant neoplasm of kidney: Secondary | ICD-10-CM

## 2016-01-02 DIAGNOSIS — C3492 Malignant neoplasm of unspecified part of left bronchus or lung: Secondary | ICD-10-CM

## 2016-01-02 NOTE — Telephone Encounter (Signed)
Lab and follow up appointments scheduled for six months, per 01/02/16 los. Patient was given a copy of the AVS report and appointment schedule, per 01/02/16 los.

## 2016-01-02 NOTE — Progress Notes (Signed)
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 2) Status post left laparoscopic radical nephrectomy under the care of Dr. Louis Meckel on 04/04/2013.  CURRENT THERAPY: Observation.  CHEMOTHERAPY INTENT: Curative  CURRENT # OF CHEMOTHERAPY CYCLES: 0  CURRENT ANTIEMETICS: Zofran, Compazine and Decadron  CURRENT SMOKING STATUS: Current smoker. Smoke cessation consult was done.  ORAL CHEMOTHERAPY AND CONSENT: None  CURRENT BISPHOSPHONATES USE: None  PAIN MANAGEMENT: 0/10  NARCOTICS INDUCED CONSTIPATION: None  LIVING WILL AND CODE STATUS: Full code  INTERVAL HISTORY: [name] 65 y.o. male came to the clinic today for routine six-month follow-up visit. The patient has no complaints today except for mild chest congestion. His grandson was sick during the Christmas time. He denied having any chest pain, shortness of breath or hemoptysis. He denied having any fever or chills. He has no weight loss or night sweats. He denied having any nausea, vomiting, diarrhea or constipation. He had repeat CT scan of the chest performed recently and he is here for evaluation and discussion of his scan results.  MEDICAL HISTORY: Past Medical History:  Diagnosis Date  . Cancer of kidney ([place])    left  . Crohn's disease ([place])   . Difficulty sleeping    occasionally  . Lung cancer ([place])   . Renal cell carcinoma of  left kidney ([place]) 02/20/2015  . Renal mass   . Renal mass, left   . 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  MEDICATIONS:  Current Outpatient Prescriptions  Medication Sig Dispense Refill  . Cholecalciferol (VITAMIN D-3 PO) Take 5,000 Units by mouth daily.     . colchicine 0.6 MG tablet Take 1 tablet (0.6 mg total) by mouth at bedtime. 14 tablet 0  . oxyCODONE (ROXICODONE) 15 MG immediate release tablet Take 15 mg by mouth every 8 (eight) hours as needed for pain (1-2 tid prn pain).    . Probiotic Product (PROBIOTIC DAILY PO) Take 1 capsule by mouth daily.     No current facility-administered medications for this visit.     SURGICAL HISTORY:  Past Surgical History:  Procedure Laterality Date  . APPENDECTOMY    . HERNIA REPAIR     x 2  . LAPAROSCOPIC NEPHRECTOMY Left 04/04/2013   Procedure: LAPAROSCOPIC LEFT RADICAL NEPHRECTOMY ;  Surgeon: [name], MD;  Location: [facility];  Service: Urology;  Laterality: Left;  . testicular removal  2000  . VIDEO BRONCHOSCOPY N/A 11/30/2012   Procedure: VIDEO BRONCHOSCOPY WITH FLUORO;  Surgeon: Elsie Stain, MD;  Location: [facility];  Service: Cardiopulmonary;  Laterality: N/A;    REVIEW OF SYSTEMS:  A comprehensive review of systems was negative except for: Respiratory: positive for cough   PHYSICAL EXAMINATION: General appearance: alert, cooperative and no distress Head: Normocephalic, without obvious abnormality, atraumatic Neck: no adenopathy, no JVD, supple, symmetrical, trachea midline and thyroid not enlarged, symmetric, no  tenderness/mass/nodules Lymph nodes: Cervical, supraclavicular, and axillary nodes normal. Resp: clear to auscultation bilaterally Back: symmetric, no curvature. ROM normal. No CVA tenderness. Cardio: regular rate and rhythm, S1, S2 normal, no murmur, click, rub or gallop GI: soft, non-tender; bowel sounds normal; no masses,  no  organomegaly Extremities: extremities normal, atraumatic, no cyanosis or edema  ECOG PERFORMANCE STATUS: 1 - Symptomatic but completely ambulatory  Blood pressure 121/63, pulse (!) 103, temperature 98.3 F (36.8 C), temperature source Oral, resp. rate 18, height 5' 9"  (1.753 m), weight 159 lb 4.8 oz (72.3 kg), SpO2 99 %.  LABORATORY DATA: Lab Results  Component Value Date   WBC 9.2 12/24/2015   HGB 14.7 12/24/2015   HCT 46.0 12/24/2015   MCV 87.6 12/24/2015   PLT 359 12/24/2015      Chemistry      Component Value Date/Time   NA 139 12/24/2015 1508   K 5.1 12/24/2015 1508   CL 99 04/05/2013 0348   CO2 27 12/24/2015 1508   BUN 19.3 12/24/2015 1508   CREATININE 1.4 (H) 12/24/2015 1508      Component Value Date/Time   CALCIUM 9.7 12/24/2015 1508   ALKPHOS 110 12/24/2015 1508   AST 11 12/24/2015 1508   ALT 10 12/24/2015 1508   BILITOT 0.48 12/24/2015 1508       RADIOGRAPHIC STUDIES: Ct Chest Wo Contrast  Result Date: 12/25/2015 CLINICAL DATA:  Stage IIIA left upper lobe squamous cell lung carcinoma diagnosed November 2014 status post concurrent chemo radiation therapy completed January 2015. Additional history of stage I clear cell renal cell carcinoma of the left kidney status post radical left nephrectomy 04/04/2013. Restaging. EXAM: CT CHEST WITHOUT CONTRAST TECHNIQUE: Multidetector CT imaging of the chest was performed following the standard protocol without IV contrast. COMPARISON:  06/21/2015 chest CT. FINDINGS: Cardiovascular: Normal heart size. Stable trace pericardial effusion/ thickening. Left main, left anterior descending and left circumflex coronary atherosclerosis. Atherosclerotic nonaneurysmal thoracic aorta. Normal caliber pulmonary arteries. Mediastinum/Nodes: No discrete thyroid nodules. Unremarkable esophagus. No pathologically enlarged axillary, mediastinal or gross hilar lymph nodes, noting limited sensitivity for the detection of hilar adenopathy on this  noncontrast study. Stable coarsely calcified subcarinal and right hilar nodes from prior granulomatous disease. Lungs/Pleura: No pneumothorax. No pleural effusion. Mild centrilobular emphysema. Stable clustered calcified sub 5 mm granulomas in the anterior right lower lobe. There is stable focal left upper parahilar lung consolidation with associated parenchymal distortion and volume loss. Bandlike opacities extending into the left upper lobe from the left upper parahilar region have slightly increased in thickness (series 5/ image 41 and image 49). No acute consolidative airspace disease or new significant pulmonary nodules. Upper abdomen: Partially visualized left nephrectomy. Punctate calcified granulomas in the spleen. Musculoskeletal: No aggressive appearing focal osseous lesions. Moderate thoracic spondylosis. IMPRESSION: 1. No definite evidence of local tumor recurrence. Bandlike opacities extending into the left upper lobe from the left upper parahilar region have slightly increased in thickness in the interval, favor continued evolution of radiation fibrosis, although ongoing chest CT surveillance is warranted. 2. No evidence of metastatic disease in the chest. 3. Aortic atherosclerosis. Left main and two-vessel coronary atherosclerosis. Electronically Signed   By: Ilona Sorrel M.D.   On: 12/25/2015 09:17    ASSESSMENT AND PLAN:  This is a very pleasant 65 years old white male with history of stage IIIa non-small cell lung cancer, status post concurrent chemoradiation with weekly carboplatin and paclitaxel. The patient also has a history of renal cell carcinoma status post  left radical nephrectomy. Has been observation for more than 2 years and feeling well. The recent CT scan of the chest showed no clear evidence for disease recurrence. I discussed the scan results with the patient today. I recommended for him to come back for follow-up visit in 6 months with repeat CT scan of the chest without  contrast because of his left nephrectomy. He was advised to call immediately if he has any concerning symptoms in the interval. I spent 10 minutes counseling the patient face to face. The total time spent in the appointment was 15 minutes. Disclaimer: This note was dictated with voice recognition software. Similar sounding words can inadvertently be transcribed and may not be corrected upon review.

## 2016-02-07 ENCOUNTER — Other Ambulatory Visit (HOSPITAL_BASED_OUTPATIENT_CLINIC_OR_DEPARTMENT_OTHER): Payer: Self-pay

## 2016-02-07 DIAGNOSIS — G4730 Sleep apnea, unspecified: Secondary | ICD-10-CM

## 2016-02-26 ENCOUNTER — Ambulatory Visit (HOSPITAL_BASED_OUTPATIENT_CLINIC_OR_DEPARTMENT_OTHER): Payer: Medicare Other | Attending: Physical Medicine and Rehabilitation | Admitting: Internal Medicine

## 2016-02-26 DIAGNOSIS — G4733 Obstructive sleep apnea (adult) (pediatric): Secondary | ICD-10-CM | POA: Insufficient documentation

## 2016-02-26 DIAGNOSIS — G4736 Sleep related hypoventilation in conditions classified elsewhere: Secondary | ICD-10-CM | POA: Diagnosis not present

## 2016-02-26 DIAGNOSIS — R6889 Other general symptoms and signs: Secondary | ICD-10-CM | POA: Diagnosis present

## 2016-02-27 ENCOUNTER — Other Ambulatory Visit (HOSPITAL_BASED_OUTPATIENT_CLINIC_OR_DEPARTMENT_OTHER): Payer: Self-pay

## 2016-02-27 DIAGNOSIS — G4730 Sleep apnea, unspecified: Secondary | ICD-10-CM

## 2016-03-01 DIAGNOSIS — G4733 Obstructive sleep apnea (adult) (pediatric): Secondary | ICD-10-CM | POA: Diagnosis not present

## 2016-03-01 NOTE — Procedures (Signed)
    Patient Name: [name], [name] Date: 02/27/2016 Gender: Male D.O.B: 1950/09/05 Age (years): 69 Referring Provider: Margaretha Sheffield Height (inches): 23 Interpreting Physician: Baird Lyons MD, ABSM Weight (lbs): 160 RPSGT: Jacolyn Reedy BMI: 24 MRN: [id number] Neck Size: 14.50 CLINICAL INFORMATION Sleep Study Type: HST  Indication for sleep study: N/A  Epworth Sleepiness Score: 3  SLEEP STUDY TECHNIQUE A multi-channel overnight portable sleep study was performed. The channels recorded were: nasal airflow, thoracic respiratory movement, and oxygen saturation with a pulse oximetry. Snoring was also monitored.  MEDICATIONS Patient self administered medications include: none reported during study.  SLEEP ARCHITECTURE Patient was studied for 391.5 minutes. The sleep efficiency was 98.0 % and the patient was supine for 61.5%. The arousal index was 0.0 per hour.  RESPIRATORY PARAMETERS The overall AHI was 24.5 per hour, with a central apnea index of 0.0 per hour.  The oxygen nadir was 83% during sleep.  CARDIAC DATA Mean heart rate during sleep was 71.0 bpm.  IMPRESSIONS - Moderate obstructive sleep apnea occurred during this study (AHI = 24.5/h). - No significant central sleep apnea occurred during this study (CAI = 0.0/h). - Moderate oxygen desaturation was noted during this study (Min O2 = 83%). - Patient snored   DIAGNOSIS - Obstructive Sleep Apnea (327.23 [G47.33 ICD-10]) - Nocturnal Hypoxemia (327.26 [G47.36 ICD-10])  RECOMMENDATIONS - Recommend CPAP titration. Other options would be based on clinical judgment. - Positional therapy avoiding supine position during sleep. - Avoid alcohol, sedatives and other CNS depressants that may worsen sleep apnea and disrupt normal sleep architecture. - Sleep hygiene should be reviewed to assess factors that may improve sleep quality. - Weight management and regular exercise should be initiated or  continued.  [Electronically signed] 03/01/2016 09:39 AM  Baird Lyons MD, ABSM Diplomate, American Board of Sleep Medicine   NPI: [id number]  [place], American Board of Sleep Medicine  ELECTRONICALLY SIGNED ON:  03/01/2016, 9:40 AM [place] PH: [phone]   FX: [phone] [place]

## 2016-04-02 ENCOUNTER — Other Ambulatory Visit (HOSPITAL_BASED_OUTPATIENT_CLINIC_OR_DEPARTMENT_OTHER): Payer: Self-pay

## 2016-04-02 DIAGNOSIS — G4733 Obstructive sleep apnea (adult) (pediatric): Secondary | ICD-10-CM

## 2016-05-06 ENCOUNTER — Other Ambulatory Visit: Payer: Self-pay | Admitting: Family Medicine

## 2016-05-06 DIAGNOSIS — G4730 Sleep apnea, unspecified: Secondary | ICD-10-CM

## 2016-05-06 HISTORY — DX: Sleep apnea, unspecified: G47.30

## 2016-05-21 ENCOUNTER — Ambulatory Visit (HOSPITAL_BASED_OUTPATIENT_CLINIC_OR_DEPARTMENT_OTHER): Payer: Medicare Other | Attending: Physical Medicine and Rehabilitation | Admitting: Internal Medicine

## 2016-05-21 DIAGNOSIS — R0683 Snoring: Secondary | ICD-10-CM | POA: Diagnosis not present

## 2016-05-21 DIAGNOSIS — Z79899 Other long term (current) drug therapy: Secondary | ICD-10-CM | POA: Insufficient documentation

## 2016-05-21 DIAGNOSIS — G4733 Obstructive sleep apnea (adult) (pediatric): Secondary | ICD-10-CM | POA: Insufficient documentation

## 2016-05-25 DIAGNOSIS — G4733 Obstructive sleep apnea (adult) (pediatric): Secondary | ICD-10-CM

## 2016-05-25 NOTE — Procedures (Signed)
  Patient Name: [name], [name] Date: 05/21/2016 Gender: Male D.O.B: 09-Sep-1950 Age (years): 69 Referring Provider: Margaretha Sheffield Height (inches): 68 Interpreting Physician: Baird Lyons MD, ABSM Weight (lbs): 160 RPSGT: Gerhard Perches BMI: 24 MRN: [id number] Neck Size: 14.50 CLINICAL INFORMATION The patient is referred for a CPAP titration to treat sleep apnea.  Date of NPSG, Split Night or HST:  Unattended HST 02/27/16   AHI 24.5/ hr, desaturation to 83%, body weight 160 lbs  SLEEP STUDY TECHNIQUE As per the AASM Manual for the Scoring of Sleep and Associated Events v2.3 (April 2016) with a hypopnea requiring 4% desaturations.  The channels recorded and monitored were frontal, central and occipital EEG, electrooculogram (EOG), submentalis EMG (chin), nasal and oral airflow, thoracic and abdominal wall motion, anterior tibialis EMG, snore microphone, electrocardiogram, and pulse oximetry. Continuous positive airway pressure (CPAP) was initiated at the beginning of the study and titrated to treat sleep-disordered breathing.  MEDICATIONS Medications self-administered by patient taken the night of the study : OXYCODONE HCL  TECHNICIAN COMMENTS Comments added by technician: Patient tolerated CPAP well  Comments added by scorer: N/A  RESPIRATORY PARAMETERS Optimal PAP Pressure (cm): 9 AHI at Optimal Pressure (/hr): 0.0 Overall Minimal O2 (%): 91.00 Supine % at Optimal Pressure (%): 55 Minimal O2 at Optimal Pressure (%): 92.0    SLEEP ARCHITECTURE The study was initiated at 10:53:30 PM and ended at 5:04:05 AM.  Sleep onset time was 32.9 minutes and the sleep efficiency was 67.7%. The total sleep time was 251.0 minutes.  The patient spent 9.96% of the night in stage N1 sleep, 63.55% in stage N2 sleep, 0.40% in stage N3 and 26.10% in REM.Stage REM latency was 57.0 minutes  Wake after sleep onset was 86.7. Alpha intrusion was absent. Supine sleep was 36.87%.  CARDIAC  DATA The 2 lead EKG demonstrated sinus rhythm. The mean heart rate was 76.39 beats per minute. Other EKG findings include: None.  LEG MOVEMENT DATA The total Periodic Limb Movements of Sleep (PLMS) were 0. The PLMS index was 0.00. A PLMS index of <15 is considered normal in adults.  IMPRESSIONS - The optimal PAP pressure was 9 cm of water. - Central sleep apnea was not noted during this titration (CAI = 0.0/h). - Mild oxygen desaturations were observed during this titration (min O2 = 91.00%). - The patient snored with Soft snoring volume during this titration study. - No cardiac abnormalities were observed during this study. - Clinically significant periodic limb movements were not noted during this study. Arousals associated with PLMs were rare.  DIAGNOSIS - Obstructive Sleep Apnea (327.23 [G47.33 ICD-10])  RECOMMENDATIONS - Trial of CPAP therapy on 9 cm H2O with a Medium size Resmed Full Face Mask AirFit F20 mask and heated humidification. - Avoid alcohol, sedatives and other CNS depressants that may worsen sleep apnea and disrupt normal sleep architecture. - Sleep hygiene should be reviewed to assess factors that may improve sleep quality. - Weight management and regular exercise should be initiated or continued.  [Electronically signed] 05/25/2016 10:03 AM  Baird Lyons MD, ABSM Diplomate, American Board of Sleep Medicine   NPI: [id number]  [place], American Board of Sleep Medicine  ELECTRONICALLY SIGNED ON:  05/25/2016, 10:01 AM [place] PH: [phone]   FX: [phone] [place]

## 2016-06-30 ENCOUNTER — Other Ambulatory Visit (HOSPITAL_BASED_OUTPATIENT_CLINIC_OR_DEPARTMENT_OTHER): Payer: Medicare Other

## 2016-06-30 ENCOUNTER — Ambulatory Visit (HOSPITAL_COMMUNITY)
Admission: RE | Admit: 2016-06-30 | Discharge: 2016-06-30 | Disposition: A | Discharge status: Home / Self Care | Payer: Medicare Other | Source: Ambulatory Visit | Attending: Internal Medicine | Admitting: Internal Medicine

## 2016-06-30 DIAGNOSIS — J439 Emphysema, unspecified: Secondary | ICD-10-CM | POA: Diagnosis not present

## 2016-06-30 DIAGNOSIS — Z85528 Personal history of other malignant neoplasm of kidney: Secondary | ICD-10-CM | POA: Diagnosis not present

## 2016-06-30 DIAGNOSIS — Z85118 Personal history of other malignant neoplasm of bronchus and lung: Secondary | ICD-10-CM | POA: Diagnosis not present

## 2016-06-30 DIAGNOSIS — C3492 Malignant neoplasm of unspecified part of left bronchus or lung: Secondary | ICD-10-CM

## 2016-06-30 LAB — CBC WITH DIFFERENTIAL/PLATELET
BASO%: 0.2 % (ref 0.0–2.0)
Basophils Absolute: 0 10*3/uL (ref 0.0–0.1)
EOS ABS: 0.1 10*3/uL (ref 0.0–0.5)
EOS%: 1.3 % (ref 0.0–7.0)
HCT: 45.6 % (ref 38.4–49.9)
HGB: 14.6 g/dL (ref 13.0–17.1)
LYMPH%: 10.9 % — AB (ref 14.0–49.0)
MCH: 29.2 pg (ref 27.2–33.4)
MCHC: 32 g/dL (ref 32.0–36.0)
MCV: 91.2 fL (ref 79.3–98.0)
MONO#: 0.5 10*3/uL (ref 0.1–0.9)
MONO%: 5 % (ref 0.0–14.0)
NEUT#: 7.9 10*3/uL — ABNORMAL HIGH (ref 1.5–6.5)
NEUT%: 82.6 % — AB (ref 39.0–75.0)
PLATELETS: 370 10*3/uL (ref 140–400)
RBC: 5 10*6/uL (ref 4.20–5.82)
RDW: 14.5 % (ref 11.0–14.6)
WBC: 9.6 10*3/uL (ref 4.0–10.3)
lymph#: 1.1 10*3/uL (ref 0.9–3.3)

## 2016-06-30 LAB — COMPREHENSIVE METABOLIC PANEL
ALT: 13 U/L (ref 0–55)
ANION GAP: 9 meq/L (ref 3–11)
AST: 11 U/L (ref 5–34)
Albumin: 3.3 g/dL — ABNORMAL LOW (ref 3.5–5.0)
Alkaline Phosphatase: 122 U/L (ref 40–150)
BUN: 14.8 mg/dL (ref 7.0–26.0)
CHLORIDE: 103 meq/L (ref 98–109)
CO2: 29 meq/L (ref 22–29)
Calcium: 9.8 mg/dL (ref 8.4–10.4)
Creatinine: 1.4 mg/dL — ABNORMAL HIGH (ref 0.7–1.3)
EGFR: 52 mL/min/{1.73_m2} — AB (ref >90)
Glucose: 114 mg/dl (ref 70–140)
Potassium: 4.8 mEq/L (ref 3.5–5.1)
Sodium: 142 mEq/L (ref 136–145)
Total Bilirubin: 0.4 mg/dL (ref 0.20–1.20)
Total Protein: 7.1 g/dL (ref 6.4–8.3)

## 2016-07-02 ENCOUNTER — Encounter: Payer: Self-pay | Admitting: Internal Medicine

## 2016-07-02 ENCOUNTER — Ambulatory Visit (HOSPITAL_BASED_OUTPATIENT_CLINIC_OR_DEPARTMENT_OTHER): Payer: Medicare Other | Admitting: Internal Medicine

## 2016-07-02 ENCOUNTER — Encounter: Payer: Self-pay | Admitting: *Deleted

## 2016-07-02 ENCOUNTER — Telehealth: Payer: Self-pay | Admitting: Internal Medicine

## 2016-07-02 VITALS — BP 128/63 | HR 77 | Temp 98.4°F | Resp 18 | Ht 68.0 in | Wt 163.1 lb

## 2016-07-02 DIAGNOSIS — Z85528 Personal history of other malignant neoplasm of kidney: Secondary | ICD-10-CM

## 2016-07-02 DIAGNOSIS — C3492 Malignant neoplasm of unspecified part of left bronchus or lung: Secondary | ICD-10-CM

## 2016-07-02 DIAGNOSIS — C642 Malignant neoplasm of left kidney, except renal pelvis: Secondary | ICD-10-CM

## 2016-07-02 DIAGNOSIS — Z85118 Personal history of other malignant neoplasm of bronchus and lung: Secondary | ICD-10-CM | POA: Diagnosis not present

## 2016-07-02 DIAGNOSIS — J449 Chronic obstructive pulmonary disease, unspecified: Secondary | ICD-10-CM

## 2016-07-02 NOTE — Progress Notes (Signed)
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 2) Status post left laparoscopic radical nephrectomy under the care of Dr. Louis Meckel on 04/04/2013.  CURRENT THERAPY: Observation.  CHEMOTHERAPY INTENT: Curative  CURRENT # OF CHEMOTHERAPY CYCLES: 0  CURRENT ANTIEMETICS: Zofran, Compazine and Decadron  CURRENT SMOKING STATUS: Current smoker. Smoke cessation consult was done.  ORAL CHEMOTHERAPY AND CONSENT: None  CURRENT BISPHOSPHONATES USE: None  PAIN MANAGEMENT: 0/10  NARCOTICS INDUCED CONSTIPATION: None  LIVING WILL AND CODE STATUS: Full code  INTERVAL HISTORY: [name] 66 y.o. male returns to the clinic today for follow-up visit. The patient is feeling fine with no specific complaints. He denied having any chest pain, shortness of breath, cough or hemoptysis. He denied having any weight loss or night sweats. He has no fever or chills. He has no nausea, vomiting, diarrhea or constipation. Unfortunately he continues to smoke and he is trying to quit smoking with Chantix. He had repeat CT scan of the chest performed recently and he is here for evaluation and discussion of his scan results.  MEDICAL HISTORY: Past Medical History:  Diagnosis Date  . Cancer of kidney ([place])    left  . Crohn's disease ([place])   . Difficulty sleeping    occasionally  . Lung cancer ([place])   . Renal cell carcinoma of  left kidney ([place]) 02/20/2015  . Renal mass   . Renal mass, left   . Testicular cancer ([place]) 2000   s/p resection, no recurrence    ALLERGIES:  has No Known Allergies.  MEDICATIONS:  Current Outpatient Prescriptions  Medication Sig Dispense Refill  . Cholecalciferol (VITAMIN D-3 PO) Take 5,000 Units by mouth daily.     . colchicine 0.6 MG tablet Take 1 tablet (0.6 mg total) by mouth at bedtime. 14 tablet 0  . oxyCODONE (ROXICODONE) 15 MG immediate release tablet Take 15 mg by mouth every 8 (eight) hours as needed for pain (1-2 tid prn pain).    . Probiotic Product (PROBIOTIC DAILY PO) Take 1 capsule by mouth daily.     No current facility-administered medications for this visit.     SURGICAL HISTORY:  Past Surgical History:  Procedure Laterality Date  . APPENDECTOMY    . HERNIA REPAIR     x 2  . LAPAROSCOPIC NEPHRECTOMY Left 04/04/2013   Procedure: LAPAROSCOPIC LEFT RADICAL NEPHRECTOMY ;  Surgeon: [name], MD;  Location: [facility];  Service: Urology;  Laterality: Left;  . testicular removal  2000  . VIDEO BRONCHOSCOPY N/A 11/30/2012   Procedure: VIDEO BRONCHOSCOPY WITH FLUORO;  Surgeon: Elsie Stain, MD;  Location: [facility];  Service: Cardiopulmonary;  Laterality: N/A;    REVIEW OF SYSTEMS:  Constitutional: negative Eyes: negative Ears, nose, mouth, throat, and face: negative Respiratory: negative Cardiovascular: negative Gastrointestinal: negative Genitourinary:negative Integument/breast: negative Hematologic/lymphatic: negative Musculoskeletal:negative Neurological: negative Behavioral/Psych: negative Endocrine: negative Allergic/Immunologic: negative   PHYSICAL EXAMINATION: General appearance: alert, cooperative and no distress  Head: Normocephalic, without obvious abnormality, atraumatic Neck: no adenopathy, no JVD, supple, symmetrical, trachea midline and thyroid not enlarged, symmetric, no tenderness/mass/nodules Lymph nodes: Cervical,  supraclavicular, and axillary nodes normal. Resp: clear to auscultation bilaterally Back: symmetric, no curvature. ROM normal. No CVA tenderness. Cardio: regular rate and rhythm, S1, S2 normal, no murmur, click, rub or gallop GI: soft, non-tender; bowel sounds normal; no masses,  no organomegaly Extremities: extremities normal, atraumatic, no cyanosis or edema Neurologic: Alert and oriented X 3, normal strength and tone. Normal symmetric reflexes. Normal coordination and gait  ECOG PERFORMANCE STATUS: 1 - Symptomatic but completely ambulatory  Blood pressure 128/63, pulse 77, temperature 98.4 F (36.9 C), temperature source Oral, resp. rate 18, height 5' 8"  (1.727 m), weight 163 lb 1.6 oz (74 kg), SpO2 96 %.  LABORATORY DATA: Lab Results  Component Value Date   WBC 9.6 06/30/2016   HGB 14.6 06/30/2016   HCT 45.6 06/30/2016   MCV 91.2 06/30/2016   PLT 370 06/30/2016      Chemistry      Component Value Date/Time   NA 142 06/30/2016 1406   K 4.8 06/30/2016 1406   CL 99 04/05/2013 0348   CO2 29 06/30/2016 1406   BUN 14.8 06/30/2016 1406   CREATININE 1.4 (H) 06/30/2016 1406      Component Value Date/Time   CALCIUM 9.8 06/30/2016 1406   ALKPHOS 122 06/30/2016 1406   AST 11 06/30/2016 1406   ALT 13 06/30/2016 1406   BILITOT 0.40 06/30/2016 1406       RADIOGRAPHIC STUDIES: Ct Chest Wo Contrast  Result Date: 06/30/2016 CLINICAL DATA:  Six-month restaging one cancer CT scan. EXAM: CT CHEST WITHOUT CONTRAST TECHNIQUE: Multidetector CT imaging of the chest was performed following the standard protocol without IV contrast. COMPARISON:  12/24/2015 FINDINGS: Cardiovascular: The heart is normal in size. No pericardial effusion. Stable tortuosity, ectasia and calcification of the thoracic aorta. Stable coronary artery calcifications. Mediastinum/Nodes: No mediastinal lymphadenopathy. Stable calcified subcarinal lymph nodes and right hilar lymph nodes. Soft tissue density in the left  hilar region appears relatively stable. This is likely treated tumor and radiation fibrosis. Stable marked irregularity of the left upper lobe and left lower lobe bronchi likely areas of stenosis and scarring from the tumor and radiation. The esophagus is grossly normal. Lungs/Pleura: New confluent triangular soft tissue density in the left upper lobe measuring 4.1 x 3.5 cm on image number 30. Previous status with [place] more linear and ground-glass in appearance. It may be progressive confluent radiation changes. Recurrent tumor could not be excluded. Recommend PET-CT for further evaluation and to exclude malignancy. No worrisome pulmonary nodules to suggest pulmonary metastatic disease. Stable underline emphysematous changes. Upper Abdomen: No significant upper abdominal findings. Stable surgical changes from a left nephrectomy. The adrenal glands are unremarkable. No obvious hepatic metastatic disease. Musculoskeletal: No significant bony findings. No evidence of osseous metastatic disease. Stable degenerative changes and osteoporosis. IMPRESSION: 1. New confluent solid triangular soft tissue density in the left upper lobe measuring 4.1 x 3.5 cm. Although this could be confluence/coalescing radiation scarring changes, recurrent neoplasm needs to be excluded. Recommend PET-CT for further evaluation. 2. No evidence of metastatic lymphadenopathy. Stable left hilar soft tissue density, likely scar. Stable calcified lymph nodes. 3. No findings for upper abdominal metastatic disease or osseous metastatic disease. 4. Stable emphysematous changes. Aortic Atherosclerosis (ICD10-I70.0) and Emphysema (ICD10-J43.9). Electronically Signed   By: Marijo Sanes M.D.   On: 06/30/2016 21:03    ASSESSMENT AND PLAN:  This is a very pleasant 66 years old white male with history of stage IIIa non-small cell lung cancer status post concurrent chemoradiation with weekly carboplatin and paclitaxel followed by observation since the  patient was noted good candidate for consolidation chemotherapy at that time. He also has a history of renal cell carcinoma status post left radical nephrectomy. Has been observation for almost 3 years. He had repeat CT scan of the chest performed recently. I personally and independently reviewed the scan images and discuss the results with the patient today and showed them the images. Unfortunately the scan showed new solid soft tissue mass in the left upper lobe concerning for radiation scarring versus recurrent neoplasm. I recommended for the patient to have repeat PET scan for further evaluation of this lesion and to rule out disease recurrence. For the smoking cessation, I strongly encouraged the patient to quit smoking and he was seen by the thoracic navigator and was getting education material about smoke cessation. For the adenocarcinoma, he will continue on observation for now. He would come back for follow-up visit in 3 weeks for reevaluation and discussion of his PET scan results and further recommendation regarding his treatment. The patient was advised to call immediately if he has any concerning symptoms in the interval.  Disclaimer: This note was dictated with voice recognition software. Similar sounding words can inadvertently be transcribed and may not be corrected upon review.

## 2016-07-02 NOTE — Progress Notes (Signed)
Oncology Nurse Navigator Documentation  Oncology Nurse Navigator Flowsheets 07/02/2016  Navigator Location [facility]  Navigator Encounter Type Clinic/MDC/I spoke with patient today at [facility].  He is doing well.  He states he is still smoking but trying to quit.  I gave and explained information on smoking cessation and asked that he call if needed further assistance.   Patient Visit Type MedOnc  Treatment Phase Follow-up  Barriers/Navigation Needs Education  Education Smoking cessation  Interventions Education  Education Method Verbal;Written  Acuity Level 1  Time Spent with Patient 15

## 2016-07-02 NOTE — Telephone Encounter (Signed)
Scheduled appt per 6/27 los - Gave patient AVS and calender per los. [facility] to contact patient with PET scan.

## 2016-07-23 ENCOUNTER — Encounter (HOSPITAL_COMMUNITY): Payer: Medicare Other

## 2016-07-24 ENCOUNTER — Encounter (HOSPITAL_COMMUNITY)
Admission: RE | Admit: 2016-07-24 | Discharge: 2016-07-24 | Disposition: A | Discharge status: Home / Self Care | Payer: Medicare Other | Source: Ambulatory Visit | Attending: Internal Medicine | Admitting: Internal Medicine

## 2016-07-24 DIAGNOSIS — C642 Malignant neoplasm of left kidney, except renal pelvis: Secondary | ICD-10-CM | POA: Diagnosis present

## 2016-07-24 DIAGNOSIS — C3492 Malignant neoplasm of unspecified part of left bronchus or lung: Secondary | ICD-10-CM | POA: Diagnosis present

## 2016-07-24 DIAGNOSIS — J449 Chronic obstructive pulmonary disease, unspecified: Secondary | ICD-10-CM | POA: Diagnosis present

## 2016-07-24 LAB — GLUCOSE, CAPILLARY: Glucose-Capillary: 102 mg/dL — ABNORMAL HIGH (ref 65–99)

## 2016-07-24 MED ORDER — FLUDEOXYGLUCOSE F - 18 (FDG) INJECTION
8.0800 | Freq: Once | INTRAVENOUS | Status: AC | PRN
  Administered 2016-07-24: 8.08 via INTRAVENOUS

## 2016-07-29 ENCOUNTER — Telehealth: Payer: Self-pay | Admitting: Internal Medicine

## 2016-07-29 ENCOUNTER — Ambulatory Visit (HOSPITAL_BASED_OUTPATIENT_CLINIC_OR_DEPARTMENT_OTHER): Payer: Medicare Other | Admitting: Internal Medicine

## 2016-07-29 ENCOUNTER — Ambulatory Visit: Payer: Medicare Other | Admitting: Internal Medicine

## 2016-07-29 ENCOUNTER — Encounter: Payer: Self-pay | Admitting: Internal Medicine

## 2016-07-29 VITALS — BP 129/80 | HR 84 | Temp 98.3°F | Resp 20 | Ht 68.0 in | Wt 164.0 lb

## 2016-07-29 DIAGNOSIS — Z85528 Personal history of other malignant neoplasm of kidney: Secondary | ICD-10-CM | POA: Diagnosis not present

## 2016-07-29 DIAGNOSIS — Z85118 Personal history of other malignant neoplasm of bronchus and lung: Secondary | ICD-10-CM | POA: Diagnosis not present

## 2016-07-29 DIAGNOSIS — J449 Chronic obstructive pulmonary disease, unspecified: Secondary | ICD-10-CM

## 2016-07-29 DIAGNOSIS — C3492 Malignant neoplasm of unspecified part of left bronchus or lung: Secondary | ICD-10-CM

## 2016-07-29 NOTE — Telephone Encounter (Signed)
Scheduled appt per 7/24 los - Gave patient AVS and calender per los. [facility] to contact patient with ct schedule.

## 2016-07-29 NOTE — Progress Notes (Signed)
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 2) Status post left laparoscopic radical nephrectomy under the care of Dr. Louis Meckel on 04/04/2013.  CURRENT THERAPY: Observation.  INTERVAL HISTORY: DRAXTON Thomas 66 y.o. male returns to the clinic today for follow-up visit accompanied by his wife. The patient is feeling fine today with no specific complaints except for mild cough and shortness breath with exertion. He denied having any chest pain or hemoptysis. He denied having any fever or chills. He has no nausea, vomiting, diarrhea or constipation. He has no significant weight loss or night sweats. He continues to smoke a few cigarettes every day but still working on quitting smoking. He was found on previous CT scan of the chest to have concerning findings on the left upper lobe suspicious for disease recurrence. I ordered a PET scan which was performed recently and the patient is here today for evaluation and discussion of his PET scan results.  MEDICAL HISTORY: Past Medical History:  Diagnosis Date  . Cancer of kidney ([name])    left  . Crohn's disease ([name])   . Difficulty sleeping    occasionally  . Lung cancer ([name])   . Renal cell carcinoma of left kidney ([name]) 02/20/2015  . Renal mass   . Renal mass, left   . Testicular cancer ([name]) 2000   s/p resection, no recurrence    ALLERGIES:  has No Known  Allergies.  MEDICATIONS:  Current Outpatient Prescriptions  Medication Sig Dispense Refill  . CHANTIX CONTINUING MONTH PAK 1 MG tablet     . Cholecalciferol (VITAMIN D-3 PO) Take 5,000 Units by mouth daily.     . colchicine 0.6 MG tablet Take 1 tablet (0.6 mg total) by mouth at bedtime. 14 tablet 0  . Oxycodone HCl 10 MG TABS     . Probiotic Product (PROBIOTIC DAILY PO) Take 1 capsule by mouth daily.    . varenicline (CHANTIX) 1 MG tablet Take 1 mg by mouth 2 (two) times daily.     No current facility-administered medications for this visit.     SURGICAL HISTORY:  Past Surgical History:  Procedure Laterality Date  . APPENDECTOMY    . HERNIA REPAIR     x 2  . LAPAROSCOPIC NEPHRECTOMY Left 04/04/2013   Procedure: LAPAROSCOPIC LEFT RADICAL NEPHRECTOMY ;  Surgeon: [name], MD;  [facility];  Service: Urology;  Laterality: Left;  . testicular removal  2000  . VIDEO BRONCHOSCOPY N/A 11/30/2012   Procedure: VIDEO BRONCHOSCOPY WITH FLUORO;  Surgeon: [name] Stain, MD;  [facility];  Service: Cardiopulmonary;  Laterality: N/A;    REVIEW OF SYSTEMS:  A comprehensive review of systems was negative except for: Respiratory: positive for cough and dyspnea on exertion   PHYSICAL EXAMINATION: General appearance: alert, cooperative and no distress Head: Normocephalic, without obvious abnormality, atraumatic Neck: no adenopathy, no JVD, supple, symmetrical, trachea midline and thyroid not enlarged, symmetric, no tenderness/mass/nodules  Lymph nodes: Cervical, supraclavicular, and axillary nodes normal. Resp: wheezes LUL Back: symmetric, no curvature. ROM normal. No CVA tenderness. Cardio: regular rate and rhythm, S1, S2 normal, no murmur, click, rub or gallop GI: soft, non-tender; bowel sounds normal; no masses,  no organomegaly Extremities: extremities normal, atraumatic, no cyanosis or edema  ECOG PERFORMANCE STATUS: 1 - Symptomatic but completely  ambulatory  Blood pressure 129/80, pulse 84, temperature 98.3 F (36.8 C), temperature source Oral, resp. rate 20, height 5' 8"  (1.727 m), weight 164 lb (74.4 kg), SpO2 98 %.  LABORATORY DATA: Lab Results  Component Value Date   WBC 9.6 06/30/2016   HGB 14.6 06/30/2016   HCT 45.6 06/30/2016   MCV 91.2 06/30/2016   PLT 370 06/30/2016      Chemistry      Component Value Date/Time   NA 142 06/30/2016 1406   K 4.8 06/30/2016 1406   CL 99 04/05/2013 0348   CO2 29 06/30/2016 1406   BUN 14.8 06/30/2016 1406   CREATININE 1.4 (H) 06/30/2016 1406      Component Value Date/Time   CALCIUM 9.8 06/30/2016 1406   ALKPHOS 122 06/30/2016 1406   AST 11 06/30/2016 1406   ALT 13 06/30/2016 1406   BILITOT 0.40 06/30/2016 1406       RADIOGRAPHIC STUDIES: Ct Chest Wo Contrast  Result Date: 06/30/2016 CLINICAL DATA:  Six-month restaging one cancer CT scan. EXAM: CT CHEST WITHOUT CONTRAST TECHNIQUE: Multidetector CT imaging of the chest was performed following the standard protocol without IV contrast. COMPARISON:  12/24/2015 FINDINGS: Cardiovascular: The heart is normal in size. No pericardial effusion. Stable tortuosity, ectasia and calcification of the thoracic aorta. Stable coronary artery calcifications. Mediastinum/Nodes: No mediastinal lymphadenopathy. Stable calcified subcarinal lymph nodes and right hilar lymph nodes. Soft tissue density in the left hilar region appears relatively stable. This is likely treated tumor and radiation fibrosis. Stable marked irregularity of the left upper lobe and left lower lobe bronchi likely areas of stenosis and scarring from the tumor and radiation. The esophagus is grossly normal. Lungs/Pleura: New confluent triangular soft tissue density in the left upper lobe measuring 4.1 x 3.5 cm on image number 30. Previous status with [place] more linear and ground-glass in appearance. It may be progressive confluent radiation changes. Recurrent tumor could not be excluded.  Recommend PET-CT for further evaluation and to exclude malignancy. No worrisome pulmonary nodules to suggest pulmonary metastatic disease. Stable underline emphysematous changes. Upper Abdomen: No significant upper abdominal findings. Stable surgical changes from a left nephrectomy. The adrenal glands are unremarkable. No obvious hepatic metastatic disease. Musculoskeletal: No significant bony findings. No evidence of osseous metastatic disease. Stable degenerative changes and osteoporosis. IMPRESSION: 1. New confluent solid triangular soft tissue density in the left upper lobe measuring 4.1 x 3.5 cm. Although this could be confluence/coalescing radiation scarring changes, recurrent neoplasm needs to be excluded. Recommend PET-CT for further evaluation. 2. No evidence of metastatic lymphadenopathy. Stable left hilar soft tissue density, likely scar. Stable calcified lymph nodes. 3. No findings for upper abdominal metastatic disease or osseous metastatic disease. 4. Stable emphysematous changes. Aortic Atherosclerosis (ICD10-I70.0) and Emphysema (ICD10-J43.9). Electronically Signed   By: Marijo Sanes M.D.   On: 06/30/2016 21:03   Nm Pet Image Restag (ps) Skull Base To Thigh  Result Date: 07/24/2016 CLINICAL DATA:  Subsequent treatment strategy for left lung cancer and left renal cell carcinoma. EXAM: NUCLEAR MEDICINE PET SKULL BASE TO THIGH TECHNIQUE: 8.1 mCi F-18 FDG was injected intravenously. Full-ring PET imaging  was performed from the skull base to thigh after the radiotracer. CT data was obtained and used for attenuation correction and anatomic localization. FASTING BLOOD GLUCOSE:  Value: 102 mg/dl COMPARISON:  11/25/2012 PET-CT ; CT chest from 06/30/2016 FINDINGS: NECK No hypermetabolic lymph nodes in the neck. CHEST The left suprahilar density and left upper lobe triangular density have a maximum SUV of 3.3. The previous left suprahilar lesion prior to treatment had a maximum SUV of 24.4. I do not see  any focally accentuated activity within the left suprahilar and left upper lobe airspace opacity, and the appearance favors radiation fibrosis with atelectasis and perhaps a component of radiation pneumonitis. There appears to be occlusion of the branches of the left upper lobe tracheobronchial tree. Old granulomatous disease with subcarinal and right infrahilar calcified lymph nodes. Coronary, aortic arch, and branch vessel atherosclerotic vascular disease. ABDOMEN/PELVIS No abnormal hypermetabolic activity within the liver, pancreas, adrenal glands, or spleen. No hypermetabolic lymph nodes in the abdomen or pelvis. Left nephrectomy.  Left orchectomy. Aortoiliac atherosclerotic vascular disease.  Ventral hernia mesh. SKELETON Costosternal activity in the thorax is felt to likely be secondary to arthropathy given the configuration and appearance. Right distal clavicular deformity. No compelling findings of osseous metastatic disease. IMPRESSION: 1. Left suprahilar density with occlusion of the left upper lobe tracheobronchial tree, and triangular airspace opacity in the left upper lobe favoring atelectasis likely with radiation fibrosis. Maximum SUV in this vicinity is 3.3, but this activity is homogeneous within the suprahilar and triangular left upper lobe density, without focally accentuated component to suggest recurrence, and this may all the therapy related. Surveillance likely warranted ; bronchoscopy to further investigate the progressive left upper lobe bronchial occlusion might be considered. 2.  Aortic Atherosclerosis (ICD10-I70.0). 3. Multifocal costosternal activity in ascribed to arthropathy. Electronically Signed   By: Van Clines M.D.   On: 07/24/2016 10:06    ASSESSMENT AND PLAN:  This is a very pleasant 66 years old white male with history of stage IIIa non-small cell lung cancer status post concurrent chemoradiation with weekly carboplatin and paclitaxel followed by observation since  the patient was noted good candidate for consolidation chemotherapy at that time. He also has a history of renal cell carcinoma status post left radical nephrectomy. He has been observation for almost 3 years. His recent CT scan of the chest showed a questionable new soft tissue mass in the left upper lobe concerning for disease recurrence. I ordered a PET scan which showed no concerning activity in this area consistent with radiation fibrosis. There was narrowing and questionable occlusion of the left upper lobe bronchus. I discussed the results with the patient and his wife today. I recommended for him to see Dr. Servando Snare for reevaluation and consideration of repeat bronchoscopy for evaluation of the left upper lobe bronchus occlusion. I will see the patient back for follow-up visit in 6 months for reevaluation with repeat CT scan of the chest. He was advised to call immediately if he has any concerning symptoms in the interval. Disclaimer: This note was dictated with voice recognition software. Similar sounding words can inadvertently be transcribed and may not be corrected upon review.

## 2016-08-05 ENCOUNTER — Encounter: Payer: Self-pay | Admitting: Cardiothoracic Surgery

## 2016-08-05 ENCOUNTER — Institutional Professional Consult (permissible substitution) (INDEPENDENT_AMBULATORY_CARE_PROVIDER_SITE_OTHER): Payer: Medicare Other | Admitting: Cardiothoracic Surgery

## 2016-08-05 ENCOUNTER — Other Ambulatory Visit: Payer: Self-pay | Admitting: Internal Medicine

## 2016-08-05 VITALS — BP 99/68 | HR 82 | Resp 16 | Ht 68.0 in | Wt 164.0 lb

## 2016-08-05 DIAGNOSIS — R918 Other nonspecific abnormal finding of lung field: Secondary | ICD-10-CM

## 2016-08-05 DIAGNOSIS — Z8547 Personal history of malignant neoplasm of testis: Secondary | ICD-10-CM | POA: Diagnosis not present

## 2016-08-05 DIAGNOSIS — Z85528 Personal history of other malignant neoplasm of kidney: Secondary | ICD-10-CM

## 2016-08-05 NOTE — Progress Notes (Signed)
[address]       [place] [address]             [phone]                    Trystian E Mulka Merwin Medical Record #[id number] Date of Birth: [birth year]  Referring: Curt Bears, MD Primary Care: Cari Caraway, MD  Chief Complaint:    Chief Complaint  Patient presents with  . 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 2) Status post left laparoscopic radical nephrectomy under the care of Dr. Louis Meckel on 04/04/2013.  CURRENT THERAPY: Observation.  History of Present Illness:    AAYAN HASKEW 66 y.o. male is seen in the office  today for valuation of recent CT scan of the chest and PET scan and consideration of repeat bronchoscopy. The patient first was diagnosed in November 2014 with a stage IIIa non-small cell lung cancer squamous cell carcinoma involving the left upper lobe and AP window lymphadenopathy in addition he had a clear-cell renal cell carcinoma diagnosed simultaneously. Patient underwent treatment with chemoradiation for the left lung mass completing this in January 2015. He notes some increasing cough no hemoptysis.      Current Activity/ Functional Status:  Patient is independent with mobility/ambulation, transfers, ADL's, IADL's.   Zubrod Score: At the time of surgery this patient's most appropriate activity status/level should be described as: []     0    Normal activity, no symptoms []     1    Restricted in  physical strenuous activity but ambulatory, able to do out light work []     2    Ambulatory and capable of self care, unable to do work activities, up and about               >50 % of waking hours                              []     3    Only limited self care, in bed greater than 50% of waking hours []     4    Completely disabled, no self care, confined to bed or chair []     5    Moribund   Past Medical History:  Diagnosis Date  . Cancer of kidney ([place])    left  . Crohn's disease ([place])   . Difficulty sleeping    occasionally  . Lung cancer ([place])   . Renal cell carcinoma of left kidney ([place]) 02/20/2015  . Renal mass   . Renal mass, left   . Testicular cancer ([place]) 2000   s/p resection, no recurrence    Past Surgical History:  Procedure Laterality Date  . APPENDECTOMY    . HERNIA REPAIR     x 2  . LAPAROSCOPIC NEPHRECTOMY Left 04/04/2013   Procedure: LAPAROSCOPIC LEFT RADICAL NEPHRECTOMY ;  Surgeon: [name],  MD;  Location: [facility];  Service: Urology;  Laterality: Left;  . testicular removal  2000  . VIDEO BRONCHOSCOPY N/A 11/30/2012   Procedure: VIDEO BRONCHOSCOPY WITH FLUORO;  Surgeon: Elsie Stain, MD;  Location: [facility];  Service: Cardiopulmonary;  Laterality: N/A;    Family History  Problem Relation Age of Onset  . Asthma Brother   . Testicular cancer Brother   . Cancer Brother   . Breast cancer Sister     Social History   Social History  . Marital status: Married    Spouse name: N/A  . Number of children: N/A  . Years of education: N/A   Occupational History  . Consultant    Social History Main Topics  . Smoking status: Current Every Day Smoker    Packs/day: 1.00    Types: Cigarettes    Start date: 01/06/1966  . Smokeless tobacco: Never Used     Comment: Smoking less than 1 ppd right now  . Alcohol use Yes     Comment: occasional  . Drug use: No  . Sexual activity: Not on file   Other Topics Concern  . Not on file   Social History  Narrative  . No narrative on file    History  Smoking Status  . Current Every Day Smoker  . Packs/day: 1.00  . Types: Cigarettes  . Start date: 01/06/1966  Smokeless Tobacco  . Never Used    Comment: Smoking less than 1 ppd right now    History  Alcohol Use  . Yes    Comment: occasional     No Known Allergies  Current Outpatient Prescriptions  Medication Sig Dispense Refill  . CHANTIX CONTINUING MONTH PAK 1 MG tablet     . Cholecalciferol (VITAMIN D-3 PO) Take 5,000 Units by mouth daily.     . colchicine 0.6 MG tablet Take 1 tablet (0.6 mg total) by mouth at bedtime. 14 tablet 0  . Oxycodone HCl 10 MG TABS Take 10 mg by mouth. FIVE TIMES PER DAY    . Probiotic Product (PROBIOTIC DAILY PO) Take 1 capsule by mouth daily.    . varenicline (CHANTIX) 1 MG tablet Take 1 mg by mouth 2 (two) times daily.     No current facility-administered medications for this visit.     Pertinent items are noted in HPI.   Review of Systems:     Cardiac Review of Systems: Y or N  Chest Pain [ n   ]  Resting SOB [ n  ] Exertional SOB  [ n ]  Orthopnea [n  ]   Pedal Edema [ n  ]    Palpitations [n  ] Syncope  [ n ]   Presyncope [ n  ]  General Review of Systems: [Y] = yes [  ]=no Constitional: recent weight change [  ];  Wt loss over the last 3 months [   ] anorexia [  ]; fatigue [  ]; nausea [  ]; night sweats [  ]; fever [  ]; or chills [  ];          Dental: poor dentition[  ]; Last Dentist visit:   Eye : blurred vision [  ]; diplopia [   ]; vision changes [  ];  Amaurosis fugax[  ]; Resp: cough [ y ];  wheezing[  ];  hemoptysis[n  ]; shortness of breath[ n ]; paroxysmal nocturnal dyspnea[ n ]; dyspnea on exertion[  ]; or orthopnea[  ];  GI:  gallstones[  ], vomiting[  ];  dysphagia[  ]; melena[  ];  hematochezia [  ]; heartburn[  ];   Hx of  Colonoscopy[  ]; GU: kidney stones [  ]; hematuria[  ];   dysuria [  ];  nocturia[  ];  history of     obstruction [  ]; urinary frequency [  ]              Skin: rash, swelling[  ];, hair loss[  ];  peripheral edema[  ];  or itching[  ]; Musculosketetal: myalgias[  ];  joint swelling[  ];  joint erythema[  ];  joint pain[  ];  back pain[  ];  Heme/Lymph: bruising[  ];  bleeding[  ];  anemia[  ];  Neuro: TIA[  ];  headaches[  ];  stroke[  ];  vertigo[  ];  seizures[  ];   paresthesias[  ];  difficulty walking[n  ];  Psych:depression[  ]; anxiety[  ];  Endocrine: diabetes[n  ];  thyroid dysfunction[n  ];  Immunizations: Flu up to date [  ]; Pneumococcal up to date [  ];  Other:  Physical Exam: BP 99/68 (BP Location: Left Arm, Patient Position: Sitting, Cuff Size: Large)   Pulse 82   Resp 16   Ht 5' 8"  (1.727 m)   Wt 164 lb (74.4 kg)   SpO2 95% Comment: RA  BMI 24.94 kg/m   PHYSICAL EXAMINATION: General appearance: alert, cooperative, appears stated age and no distress Head: Normocephalic, without obvious abnormality, atraumatic Neck: no adenopathy, no carotid bruit, no JVD, supple, symmetrical, trachea midline, thyroid not enlarged, symmetric, no tenderness/mass/nodules and Patient has very limited motion of his neck both for the right and left and also with extension and flexion Lymph nodes: Cervical, supraclavicular, and axillary nodes normal. Resp: clear to auscultation bilaterally Back: symmetric, no curvature. ROM normal. No CVA tenderness. Cardio: regular rate and rhythm, S1, S2 normal, no murmur, click, rub or gallop GI: soft, non-tender; bowel sounds normal; no masses,  no organomegaly Extremities: extremities normal, atraumatic, no cyanosis or edema Neurologic: Grossly normal  Diagnostic Studies & Laboratory data:     Recent Radiology Findings:   Nm Pet Image Restag (ps) Skull Base To Thigh  Result Date: 07/24/2016 CLINICAL DATA:  Subsequent treatment strategy for left lung cancer and left renal cell carcinoma. EXAM: NUCLEAR MEDICINE PET SKULL BASE TO THIGH TECHNIQUE: 8.1 mCi F-18 FDG was injected intravenously. Full-ring  PET imaging was performed from the skull base to thigh after the radiotracer. CT data was obtained and used for attenuation correction and anatomic localization. FASTING BLOOD GLUCOSE:  Value: 102 mg/dl COMPARISON:  11/25/2012 PET-CT ; CT chest from 06/30/2016 FINDINGS: NECK No hypermetabolic lymph nodes in the neck. CHEST The left suprahilar density and left upper lobe triangular density have a maximum SUV of 3.3. The previous left suprahilar lesion prior to treatment had a maximum SUV of 24.4. I do not see any focally accentuated activity within the left suprahilar and left upper lobe airspace opacity, and the appearance favors radiation fibrosis with atelectasis and perhaps a component of radiation pneumonitis. There appears to be occlusion of the branches of the left upper lobe tracheobronchial tree. Old granulomatous disease with subcarinal and right infrahilar calcified lymph nodes. Coronary, aortic arch, and branch vessel atherosclerotic vascular disease. ABDOMEN/PELVIS No abnormal hypermetabolic activity within the liver, pancreas, adrenal glands, or spleen. No hypermetabolic lymph nodes in the abdomen or pelvis. Left nephrectomy.  Left orchectomy. Aortoiliac atherosclerotic vascular disease.  Ventral hernia mesh. SKELETON Costosternal activity in the thorax is felt to likely be secondary to arthropathy given the configuration and appearance. Right distal clavicular deformity. No compelling findings of osseous metastatic disease. IMPRESSION: 1. Left suprahilar density with occlusion of the left upper lobe tracheobronchial tree, and triangular airspace opacity in the left upper lobe favoring atelectasis likely with radiation fibrosis. Maximum SUV in this vicinity is 3.3, but this activity is homogeneous within the suprahilar and triangular left upper lobe density, without focally accentuated component to suggest recurrence, and this may all the therapy related. Surveillance likely warranted ; bronchoscopy to  further investigate the progressive left upper lobe bronchial occlusion might be considered. 2.  Aortic Atherosclerosis (ICD10-I70.0). 3. Multifocal costosternal activity in ascribed to arthropathy. Electronically Signed   By: Van Clines M.D.   On: 07/24/2016 10:06     I have independently reviewed the above radiologic studies.  Recent Lab Findings: Lab Results  Component Value Date   WBC 9.6 06/30/2016   HGB 14.6 06/30/2016   HCT 45.6 06/30/2016   PLT 370 06/30/2016   GLUCOSE 114 06/30/2016   ALT 13 06/30/2016   AST 11 06/30/2016   NA 142 06/30/2016   K 4.8 06/30/2016   CL 99 04/05/2013   CREATININE 1.4 (H) 06/30/2016   BUN 14.8 06/30/2016   CO2 29 06/30/2016   INR 1.08 07/02/2009      Assessment / Plan:   Patient has a tremor density in the left upper lobe suggestive of atelectasis with narrowing of the bronchus, this area is in the target field of previous radiation but with the patient's advanced disease several years ago repeat bronchoscopy has been recommended to the patient to rule out the possibility of recurrence. The patient is agreeable with proceeding later this week. With his rigid neck we will have anesthesia see the patient preoperatively.       I  spent 30 minutes counseling the patient face to face and 50% or more the  time was spent in counseling and coordination of care. The total time spent in the appointment was 40 minutes.  Grace Isaac MD      [address] [place] [address] Office [phone]   Beeper [phone]  08/05/2016 5:15 PM

## 2016-08-06 ENCOUNTER — Other Ambulatory Visit: Payer: Self-pay | Admitting: *Deleted

## 2016-08-06 DIAGNOSIS — R918 Other nonspecific abnormal finding of lung field: Secondary | ICD-10-CM

## 2016-08-06 NOTE — Progress Notes (Addendum)
Anesthesia Note:   Patient is a 66 year old male scheduled for video bronchoscopy, lung biopsy on 08/08/16 by Dr. Lanelle Bal. He previously underwent chemoradiation for LUL lung cancer and surveillance CT showed new confluent solid triangular soft tissue density in the LUL which could be radiation scarring changes but bronchoscopy recommended to rule out the possibility of recurrence.    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 Servando Snare has requested anesthesia consult due to rigid neck, limited neck mobility. PAT is scheduled for 09/07/16 at 8:15 AM.  Anesthesia record 04/04/13:  04/04/13; 3491; Grade 3; Self, CRNA; Oral Pharyngeal Airway; MAC, 4; 8 mm; Cuffed, Min.occ.pres., Air; 1; Stylet; Chest Rise, Direct Visualization, ETCO2 (Capnography), Bilateral Breath Sounds; 22 cm  - PCP is Dr. Theadore Nan. - HEM-ONC is Dr. Curt Bears. - Pulmonologist was Dr. Asencion Noble, last office note seen is from 02/02/13. - Urologist is Dr. Louis Meckel.   Meds include hydroxyzine, oxycodone, Chantix.  Last EKG on file 11/22/14.  Chest CT 06/30/16: IMPRESSION: 1. New confluent solid triangular soft tissue density in the left upper lobe measuring 4.1 x 3.5 cm. Although this could be confluence/coalescing radiation scarring changes, recurrent neoplasm needs to be excluded. Recommend PET-CT for further evaluation. 2. No evidence of metastatic lymphadenopathy. Stable left hilar soft tissue density, likely scar. Stable calcified lymph nodes. 3. No findings for upper abdominal metastatic disease or osseous metastatic disease. 4. Stable emphysematous changes. 5. Aortic Atherosclerosis  (ICD10-I70.0) and Emphysema (ICD10-J43.9).  PET scan 07/24/16: IMPRESSION: 1. Left suprahilar density with occlusion of the left upper lobe tracheobronchial tree, and triangular airspace opacity in the left upper lobe favoring atelectasis likely with radiation fibrosis. Maximum SUV in this vicinity is 3.3, but this activity is homogeneous within the suprahilar and triangular left upper lobe density, without focally accentuated component to suggest recurrence, and this may all the therapy related. Surveillance likely warranted; bronchoscopy to further investigate the progressive left upper lobe bronchial occlusion might be considered. 2.  Aortic Atherosclerosis (ICD10-I70.0). 3. Multifocal costosternal activity in ascribed to arthropathy.  Preoperative labs PENDING PAT appointment.  George Hugh [facility]/Anesthesiology Phone [phone] 08/06/2016 5:29 PM  Addendum:   Saw pt in PAT per Dr. Servando Snare requested anesthesia consult for rigid neck, limited neck mobility. On exam, pt has virtually no ROM in his neck.  Pt reports he has not had surgery since his neck "self-fused" (sees Melina Schools, MD with orthopedics; c-spine films requested from his office). However, pt can open his mouth widely, mallampati class 1-2 view.  Reviewed situation with Dr. Deatra Canter. I let pt know he will likely need to be intubated with a video laryngoscope.  Pt had no questions or concerns about procedure.   Preoperative labs reviewed and are acceptable for surgery.   CXR 08/07/16: Increasing airspace disease in the left upper lobe, likely postobstructive atelectasis.  If no changes, I anticipate pt can proceed with surgery as scheduled.   Willeen Cass, FNP-BC [facility] Stay [facility]/Anesthesiology Phone: [phone] 08/07/2016 12:43 PM

## 2016-08-06 NOTE — Pre-Procedure Instructions (Signed)
JARQUIS WALKER  08/06/2016      [facility] [address] [address] - [place], [place] - [place] [address] Phone: [phone] Fax: [phone]    Your procedure is scheduled on August 08, 2016.  Report to [facility] Admitting at 530 AM.  Call this number if you have problems the morning of surgery:  [phone]   Remember:  Do not eat food or drink liquids after midnight.  Take these medicines the morning of surgery with A SIP OF WATER oxycodone-if needed for pain  7 days prior to surgery STOP taking any Aspirin, Aleve, Naproxen, Ibuprofen, Motrin, Advil, Goody's, BC's, all herbal medications, fish oil, and all vitamins   Do not wear jewelry, make-up or nail polish.  Do not wear lotions, powders, or perfumes, or deoderant.  Men may shave face and neck.  Do not bring valuables to the hospital.  [facility] is not responsible for any belongings or valuables.  Contacts, dentures or bridgework may not be worn into surgery.  Leave your suitcase in the car.  After surgery it may be brought to your room.  For patients admitted to the hospital, discharge time will be determined by your treatment team.  Patients discharged the day of surgery will not be allowed to drive home.   Special instructions:   [place]- Preparing For Surgery  Before surgery, you can play an important role. Because skin is not sterile, your skin needs to be as free of germs as possible. You can reduce the number of germs on your skin by washing with CHG (chlorahexidine gluconate) Soap before surgery.  CHG is an antiseptic cleaner which kills germs and bonds with the skin to continue killing germs even after washing.  Please do not use if you have an allergy to CHG or antibacterial soaps. If your skin becomes reddened/irritated stop using the CHG.  Do not shave (including legs and underarms) for at least 48 hours prior to first CHG shower. It is OK to  shave your face.  Please follow these instructions carefully.   1. Shower the NIGHT BEFORE SURGERY and the MORNING OF SURGERY with CHG.   2. If you chose to wash your hair, wash your hair first as usual with your normal shampoo.  3. After you shampoo, rinse your hair and body thoroughly to remove the shampoo.  4. Use CHG as you would any other liquid soap. You can apply CHG directly to the skin and wash gently with a scrungie or a clean washcloth.   5. Apply the CHG Soap to your body ONLY FROM THE NECK DOWN.  Do not use on open wounds or open sores. Avoid contact with your eyes, ears, mouth and genitals (private parts). Wash genitals (private parts) with your normal soap.  6. Wash thoroughly, paying special attention to the area where your surgery will be performed.  7. Thoroughly rinse your body with warm water from the neck down.  8. DO NOT shower/wash with your normal soap after using and rinsing off the CHG Soap.  9. Pat yourself dry with a CLEAN TOWEL.   10. Wear CLEAN PAJAMAS   11. Place CLEAN SHEETS on your bed the night of your first shower and DO NOT SLEEP WITH PETS.    Day of Surgery: Do not apply any deodorants/lotions. Please wear clean clothes to the hospital/surgery center.     Please read over the following fact sheets that you were  given. Pain Booklet, Coughing and Deep Breathing, MRSA Information and Surgical Site Infection Prevention

## 2016-08-07 ENCOUNTER — Encounter (HOSPITAL_COMMUNITY): Payer: Self-pay

## 2016-08-07 ENCOUNTER — Ambulatory Visit (HOSPITAL_COMMUNITY)
Admission: RE | Admit: 2016-08-07 | Discharge: 2016-08-07 | Disposition: A | Discharge status: Home / Self Care | Payer: Medicare Other | Source: Ambulatory Visit | Attending: Cardiothoracic Surgery | Admitting: Cardiothoracic Surgery

## 2016-08-07 ENCOUNTER — Encounter (HOSPITAL_COMMUNITY)
Admission: RE | Admit: 2016-08-07 | Discharge: 2016-08-07 | Disposition: A | Discharge status: Home / Self Care | Payer: Medicare Other | Source: Ambulatory Visit | Attending: Cardiothoracic Surgery | Admitting: Cardiothoracic Surgery

## 2016-08-07 DIAGNOSIS — R918 Other nonspecific abnormal finding of lung field: Secondary | ICD-10-CM | POA: Diagnosis not present

## 2016-08-07 DIAGNOSIS — Z01818 Encounter for other preprocedural examination: Secondary | ICD-10-CM | POA: Insufficient documentation

## 2016-08-07 DIAGNOSIS — Z01812 Encounter for preprocedural laboratory examination: Secondary | ICD-10-CM | POA: Insufficient documentation

## 2016-08-07 HISTORY — DX: Other complications of anesthesia, initial encounter: T88.59XA

## 2016-08-07 HISTORY — DX: Adverse effect of unspecified anesthetic, initial encounter: T41.45XA

## 2016-08-07 HISTORY — DX: Personal history of other medical treatment: Z92.89

## 2016-08-07 HISTORY — DX: Unspecified osteoarthritis, unspecified site: M19.90

## 2016-08-07 HISTORY — DX: Dyspnea, unspecified: R06.00

## 2016-08-07 HISTORY — DX: Sleep apnea, unspecified: G47.30

## 2016-08-07 LAB — CBC
HCT: 45.9 % (ref 39.0–52.0)
Hemoglobin: 14.6 g/dL (ref 13.0–17.0)
MCH: 28.7 pg (ref 26.0–34.0)
MCHC: 31.8 g/dL (ref 30.0–36.0)
MCV: 90.4 fL (ref 78.0–100.0)
Platelets: 318 10*3/uL (ref 150–400)
RBC: 5.08 MIL/uL (ref 4.22–5.81)
RDW: 14.3 % (ref 11.5–15.5)
WBC: 9.1 10*3/uL (ref 4.0–10.5)

## 2016-08-07 LAB — COMPREHENSIVE METABOLIC PANEL
ALT: 16 U/L — ABNORMAL LOW (ref 17–63)
AST: 18 U/L (ref 15–41)
Albumin: 3.4 g/dL — ABNORMAL LOW (ref 3.5–5.0)
Alkaline Phosphatase: 104 U/L (ref 38–126)
Anion gap: 6 (ref 5–15)
BUN: 11 mg/dL (ref 6–20)
CO2: 29 mmol/L (ref 22–32)
Calcium: 9.5 mg/dL (ref 8.9–10.3)
Chloride: 106 mmol/L (ref 101–111)
Creatinine, Ser: 1.4 mg/dL — ABNORMAL HIGH (ref 0.61–1.24)
GFR calc Af Amer: 59 mL/min — ABNORMAL LOW (ref >60)
GFR calc non Af Amer: 50 mL/min — ABNORMAL LOW (ref >60)
Glucose, Bld: 104 mg/dL — ABNORMAL HIGH (ref 65–99)
Potassium: 4.9 mmol/L (ref 3.5–5.1)
Sodium: 141 mmol/L (ref 135–145)
Total Bilirubin: 0.4 mg/dL (ref 0.3–1.2)
Total Protein: 6.8 g/dL (ref 6.5–8.1)

## 2016-08-07 LAB — PROTIME-INR
INR: 0.97
Prothrombin Time: 12.9 seconds (ref 11.4–15.2)

## 2016-08-07 LAB — APTT: aPTT: 28 seconds (ref 24–36)

## 2016-08-08 ENCOUNTER — Encounter (HOSPITAL_COMMUNITY): Payer: Self-pay | Admitting: *Deleted

## 2016-08-08 ENCOUNTER — Ambulatory Visit (HOSPITAL_COMMUNITY): Payer: Medicare Other | Admitting: Vascular Surgery

## 2016-08-08 ENCOUNTER — Encounter (HOSPITAL_COMMUNITY)
Admission: RE | Disposition: A | Discharge status: Home / Self Care | Payer: Self-pay | Source: Ambulatory Visit | Attending: Cardiothoracic Surgery

## 2016-08-08 ENCOUNTER — Ambulatory Visit (HOSPITAL_COMMUNITY)
Admission: RE | Admit: 2016-08-08 | Discharge: 2016-08-08 | Disposition: A | Discharge status: Home / Self Care | Payer: Medicare Other | Source: Ambulatory Visit | Attending: Cardiothoracic Surgery | Admitting: Cardiothoracic Surgery

## 2016-08-08 ENCOUNTER — Ambulatory Visit (HOSPITAL_COMMUNITY): Payer: Medicare Other | Admitting: Anesthesiology

## 2016-08-08 DIAGNOSIS — N289 Disorder of kidney and ureter, unspecified: Secondary | ICD-10-CM | POA: Diagnosis not present

## 2016-08-08 DIAGNOSIS — Z8547 Personal history of malignant neoplasm of testis: Secondary | ICD-10-CM | POA: Diagnosis not present

## 2016-08-08 DIAGNOSIS — Z905 Acquired absence of kidney: Secondary | ICD-10-CM | POA: Diagnosis not present

## 2016-08-08 DIAGNOSIS — R222 Localized swelling, mass and lump, trunk: Secondary | ICD-10-CM | POA: Diagnosis not present

## 2016-08-08 DIAGNOSIS — J449 Chronic obstructive pulmonary disease, unspecified: Secondary | ICD-10-CM | POA: Diagnosis not present

## 2016-08-08 DIAGNOSIS — Z923 Personal history of irradiation: Secondary | ICD-10-CM | POA: Diagnosis not present

## 2016-08-08 DIAGNOSIS — R918 Other nonspecific abnormal finding of lung field: Secondary | ICD-10-CM | POA: Diagnosis present

## 2016-08-08 DIAGNOSIS — G4730 Sleep apnea, unspecified: Secondary | ICD-10-CM | POA: Insufficient documentation

## 2016-08-08 DIAGNOSIS — K5090 Crohn's disease, unspecified, without complications: Secondary | ICD-10-CM | POA: Insufficient documentation

## 2016-08-08 DIAGNOSIS — Z85118 Personal history of other malignant neoplasm of bronchus and lung: Secondary | ICD-10-CM | POA: Insufficient documentation

## 2016-08-08 DIAGNOSIS — M1990 Unspecified osteoarthritis, unspecified site: Secondary | ICD-10-CM | POA: Diagnosis not present

## 2016-08-08 DIAGNOSIS — Z85528 Personal history of other malignant neoplasm of kidney: Secondary | ICD-10-CM | POA: Insufficient documentation

## 2016-08-08 DIAGNOSIS — F17210 Nicotine dependence, cigarettes, uncomplicated: Secondary | ICD-10-CM | POA: Diagnosis not present

## 2016-08-08 HISTORY — PX: LUNG BIOPSY: SHX5088

## 2016-08-08 HISTORY — PX: VIDEO BRONCHOSCOPY: SHX5072

## 2016-08-08 SURGERY — BRONCHOSCOPY, VIDEO-ASSISTED
Anesthesia: General | Site: Chest

## 2016-08-08 MED ORDER — PROPOFOL 10 MG/ML IV BOLUS
INTRAVENOUS | Status: AC
  Filled 2016-08-08: qty 20

## 2016-08-08 MED ORDER — ONDANSETRON HCL 4 MG/2ML IJ SOLN
INTRAMUSCULAR | Status: DC | PRN
  Administered 2016-08-08: 4 mg via INTRAVENOUS

## 2016-08-08 MED ORDER — EPINEPHRINE PF 1 MG/ML IJ SOLN
INTRAMUSCULAR | Status: AC
  Filled 2016-08-08: qty 1

## 2016-08-08 MED ORDER — PHENYLEPHRINE HCL 10 MG/ML IJ SOLN
INTRAMUSCULAR | Status: DC | PRN
  Administered 2016-08-08: 80 ug via INTRAVENOUS
  Administered 2016-08-08: 120 ug via INTRAVENOUS

## 2016-08-08 MED ORDER — 0.9 % SODIUM CHLORIDE (POUR BTL) OPTIME
TOPICAL | Status: DC | PRN
  Administered 2016-08-08: 1000 mL

## 2016-08-08 MED ORDER — SUGAMMADEX SODIUM 200 MG/2ML IV SOLN
INTRAVENOUS | Status: DC | PRN
  Administered 2016-08-08: 200 mg via INTRAVENOUS

## 2016-08-08 MED ORDER — PROMETHAZINE HCL 25 MG/ML IJ SOLN: 6.2500 mg | INTRAMUSCULAR | Status: DC | PRN

## 2016-08-08 MED ORDER — MIDAZOLAM HCL 2 MG/2ML IJ SOLN
INTRAMUSCULAR | Status: AC
  Filled 2016-08-08: qty 2

## 2016-08-08 MED ORDER — LACTATED RINGERS IV SOLN
INTRAVENOUS | Status: DC
  Administered 2016-08-08 (×3): via INTRAVENOUS

## 2016-08-08 MED ORDER — PROPOFOL 10 MG/ML IV BOLUS
INTRAVENOUS | Status: DC | PRN
  Administered 2016-08-08: 150 mg via INTRAVENOUS

## 2016-08-08 MED ORDER — ROCURONIUM BROMIDE 100 MG/10ML IV SOLN
INTRAVENOUS | Status: DC | PRN
  Administered 2016-08-08: 40 mg via INTRAVENOUS
  Administered 2016-08-08: 10 mg via INTRAVENOUS

## 2016-08-08 MED ORDER — LIDOCAINE HCL (CARDIAC) 20 MG/ML IV SOLN
INTRAVENOUS | Status: DC | PRN
  Administered 2016-08-08: 60 mg via INTRAVENOUS

## 2016-08-08 MED ORDER — MIDAZOLAM HCL 5 MG/5ML IJ SOLN
INTRAMUSCULAR | Status: DC | PRN
  Administered 2016-08-08: 2 mg via INTRAVENOUS

## 2016-08-08 MED ORDER — FENTANYL CITRATE (PF) 100 MCG/2ML IJ SOLN
INTRAMUSCULAR | Status: DC | PRN
  Administered 2016-08-08: 100 ug via INTRAVENOUS

## 2016-08-08 MED ORDER — EPINEPHRINE PF 1 MG/ML IJ SOLN
INTRAMUSCULAR | Status: DC | PRN
  Administered 2016-08-08 (×2): 1 mg

## 2016-08-08 MED ORDER — FENTANYL CITRATE (PF) 250 MCG/5ML IJ SOLN
INTRAMUSCULAR | Status: AC
  Filled 2016-08-08: qty 5

## 2016-08-08 MED ORDER — DEXTROSE 5 % IV SOLN
INTRAVENOUS | Status: DC | PRN
  Administered 2016-08-08: 50 ug/min via INTRAVENOUS

## 2016-08-08 MED ORDER — FENTANYL CITRATE (PF) 100 MCG/2ML IJ SOLN: 25.0000 ug | INTRAMUSCULAR | Status: DC | PRN

## 2016-08-08 SURGICAL SUPPLY — 20 items
BRUSH CYTOL CELLEBRITY 1.5X140 (MISCELLANEOUS) ×2 IMPLANT
CANISTER SUCT 3000ML PPV (MISCELLANEOUS) ×3 IMPLANT
CONT SPEC 4OZ CLIKSEAL STRL BL (MISCELLANEOUS) ×3 IMPLANT
COVER BACK TABLE 60X90IN (DRAPES) ×3 IMPLANT
FORCEPS BIOP RJ4 1.8 (CUTTING FORCEPS) ×1 IMPLANT
GAUZE SPONGE 4X4 12PLY STRL (GAUZE/BANDAGES/DRESSINGS) ×3 IMPLANT
GLOVE BIO SURGEON STRL SZ 6.5 (GLOVE) ×1 IMPLANT
GLOVE SURG SS PI 6.0 STRL IVOR (GLOVE) ×1 IMPLANT
GOWN STRL REUS W/ TWL LRG LVL3 (GOWN DISPOSABLE) IMPLANT
GOWN STRL REUS W/TWL LRG LVL3 (GOWN DISPOSABLE) ×6
KIT CLEAN ENDO COMPLIANCE (KITS) ×3 IMPLANT
KIT ROOM TURNOVER OR (KITS) ×3 IMPLANT
MARKER SKIN DUAL TIP RULER LAB (MISCELLANEOUS) ×1 IMPLANT
NEEDLE 22X1 1/2 (OR ONLY) (NEEDLE) ×1 IMPLANT
NS IRRIG 1000ML POUR BTL (IV SOLUTION) ×3 IMPLANT
OIL SILICONE PENTAX (PARTS (SERVICE/REPAIRS)) ×3 IMPLANT
SYR 20ML ECCENTRIC (SYRINGE) ×4 IMPLANT
TOWEL OR 17X24 6PK STRL BLUE (TOWEL DISPOSABLE) ×3 IMPLANT
TRAP SPECIMEN MUCOUS 40CC (MISCELLANEOUS) ×1 IMPLANT
TUBE CONNECTING 20X1/4 (TUBING) ×3 IMPLANT

## 2016-08-08 NOTE — Anesthesia Postprocedure Evaluation (Signed)
Anesthesia Post Note  Patient: [name]  Procedure(s) Performed: Procedure(s) (LRB): VIDEO BRONCHOSCOPY (N/A) LEFT LUNG BIOPSY (Left)     Patient location during evaluation: PACU Anesthesia Type: General Level of consciousness: awake and alert Pain management: pain level controlled Vital Signs Assessment: post-procedure vital signs reviewed and stable Respiratory status: spontaneous breathing, nonlabored ventilation, respiratory function stable and patient connected to nasal cannula oxygen Cardiovascular status: blood pressure returned to baseline and stable Postop Assessment: no signs of nausea or vomiting Anesthetic complications: no    Last Vitals:  Vitals:   08/08/16 1051 08/08/16 1105  BP: (!) 108/55 101/64  Pulse: 95 90  Resp: 12 12  Temp:  36.7 C    Last Pain:  Vitals:   08/08/16 1105  TempSrc:   PainSc: 0-No pain                 Tiajuana Amass

## 2016-08-08 NOTE — Discharge Instructions (Signed)
Flexible Bronchoscopy, Care After °This sheet gives you information about how to care for yourself after your procedure. Your health care provider may also give you more specific instructions. If you have problems or questions, contact your health care provider. °What can I expect after the procedure? °After the procedure, it is common to have the following symptoms for 24-48 hours: °· A cough that is worse than it was before the procedure. °· A low-grade fever. °· A sore throat or hoarse voice. °· Small streaks of blood in the mucus from your lungs (sputum), if tissue samples were removed (biopsy). ° °Follow these instructions at home: °Eating and drinking °· Do not eat or drink anything (including water) for 2 hours after your procedure, or until your numbing medicine (local anesthetic) has worn off. Having a numb throat increases your risk of burning yourself or choking. °· After your numbness is gone and your cough and gag reflexes have returned, you may start eating only soft foods and slowly drinking liquids. °· The day after the procedure, return to your normal diet. °Driving °· Do not drive for 24 hours if you were given a medicine to help you relax (sedative). °· Do not drive or use heavy machinery while taking prescription pain medicine. °General instructions °· Take over-the-counter and prescription medicines only as told by your health care provider. °· Return to your normal activities as told by your health care provider. Ask your health care provider what activities are safe for you. °· Do not use any products that contain nicotine or tobacco, such as cigarettes and e-cigarettes. If you need help quitting, ask your health care provider. °· Keep all follow-up visits as told by your health care provider. This is important, especially if you had a biopsy taken. °Get help right away if: °· You have shortness of breath that gets worse. °· You become light-headed or feel like you might faint. °· You have  chest pain. °· You cough up more than a small amount of blood. °· The amount of blood you cough up increases. °Summary °· Common symptoms in the 24-48 hours following a flexible bronchoscopy include cough, low-grade fever, sore throat or hoarse voice, and blood-streaked mucus from the lungs (if you had a biopsy). °· Do not eat or drink anything (including water) for 2 hours after your procedure, or until your local anesthetic has worn off. You can return to your normal diet the day after the procedure. °· Get help right away if you develop worsening shortness of breath, have chest pain, become light-headed, or cough up more than a small amount of blood. °This information is not intended to replace advice given to you by your health care provider. Make sure you discuss any questions you have with your health care provider. °Document Released: 07/12/2004 Document Revised: 01/11/2016 Document Reviewed: 01/11/2016 °Elsevier Interactive Patient Education © 2017 [employer] Inc. ° °

## 2016-08-08 NOTE — Transfer of Care (Signed)
Immediate Anesthesia Transfer of Care Note  Patient: [name]  Procedure(s) Performed: Procedure(s): VIDEO BRONCHOSCOPY (N/A) LEFT LUNG BIOPSY (Left)  Patient Location: PACU  Anesthesia Type:General  Level of Consciousness: awake, alert  and oriented  Airway & Oxygen Therapy: Patient Spontanous Breathing and Patient connected to face mask oxygen  Post-op Assessment: Report given to RN, Post -op Vital signs reviewed and stable and Patient moving all extremities X 4  Post vital signs: Reviewed and stable  Last Vitals:  Vitals:   08/08/16 0753 08/08/16 1036  BP: 98/63 125/72  Pulse: 80 98  Resp: 18 20  Temp: 36.8 C 36.7 C    Last Pain:  Vitals:   08/08/16 0753  TempSrc: Oral         Complications: No apparent anesthesia complications

## 2016-08-08 NOTE — Brief Op Note (Signed)
      [address]       [place] [address]             [phone]      08/08/2016  10:42 AM  PATIENT:  [name]  67 y.o. male  PRE-OPERATIVE DIAGNOSIS:  Lung mass  POST-OPERATIVE DIAGNOSIS:  LUNG MASS  PROCEDURE:  Procedure(s): VIDEO BRONCHOSCOPY (N/A) LEFT LUNG BIOPSY (Left)  SURGEON:  Surgeon(s) and Role:    * Grace Isaac, MD - Primary   ANESTHESIA:   general  EBL:  Total I/O In: 1190 [I.V.:1190] Out: 50 [Blood:50]  BLOOD ADMINISTERED:none  DRAINS: none   LOCAL MEDICATIONS USED:  NONE  SPECIMEN:  Source of Specimen:  left upper lobe bronchus bx and brushings   DISPOSITION OF SPECIMEN:  PATHOLOGY  COUNTS:  YES  DICTATION: .Dragon Dictation  PLAN OF CARE: Discharge to home after PACU  PATIENT DISPOSITION:  PACU - hemodynamically stable.   Delay start of Pharmacological VTE agent (>24hrs) due to surgical blood loss or risk of bleeding: yes   Left upper lobe

## 2016-08-08 NOTE — H&P (Signed)
[address]       [place] [address]             [phone]                    [name] Medical Record #[id number] Date of Birth: 1950-10-14  Referring: Dr [name]  Primary Care: Reginald Caraway, MD  Chief Complaint:    No chief complaint on file. 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 2) Status post left laparoscopic radical nephrectomy under the care of Dr. Louis Thomas on 04/04/2013.  CURRENT THERAPY: Observation.  History of Present Illness:    [name] 66 y.o. male is seen in the office  today for valuation of recent CT scan of the chest and PET scan and consideration of repeat bronchoscopy. The patient first was diagnosed in November 2014 with a stage IIIa non-small cell lung cancer squamous cell carcinoma involving the left upper lobe and AP window lymphadenopathy in addition he had a clear-cell renal cell carcinoma diagnosed simultaneously. Patient underwent treatment with chemoradiation for the left lung mass completing this in January 2015. He notes some increasing cough no hemoptysis.      Current Activity/ Functional Status:  Patient is independent with mobility/ambulation, transfers, ADL's, IADL's.   Zubrod Score: At the time of surgery this patient's most appropriate activity status/level should be described as: []     0    Normal activity, no symptoms []     1    Restricted in physical strenuous activity but ambulatory, able to do out light work []     2    Ambulatory and capable of self care, unable to  do work activities, up and about               >50 % of waking hours                              []     3    Only limited self care, in bed greater than 50% of waking hours []     4    Completely disabled, no self care, confined to bed or chair []     5    Moribund   Past Medical History:  Diagnosis Date  . Arthritis   . Cancer of kidney ([place])    left  . Complication of anesthesia    Very little movement of neck- "self fusioning"  . Crohn's disease ([place])    no issues since 2012  . Difficulty sleeping    occasionally  . Dyspnea    with a little exertion,  "smoked cigarettes all my life"  . History of blood transfusion    "chron's flare"  . Lung cancer ([place])   . Renal cell carcinoma of left kidney ([place]) 02/20/2015  . Renal mass   . Renal mass, left   . Sleep apnea 05/2016  . Testicular cancer ([place]) 2000   s/p resection, no recurrence    Past Surgical History:  Procedure Laterality Date  .  APPENDECTOMY    . COLONOSCOPY    . HERNIA REPAIR     x 2  . LAPAROSCOPIC NEPHRECTOMY Left 04/04/2013   Procedure: LAPAROSCOPIC LEFT RADICAL NEPHRECTOMY ;  Surgeon: Reginald Hughs, MD;  Location: [facility];  Service: Urology;  Laterality: Left;  . testicular removal  2000  . VIDEO BRONCHOSCOPY N/A 11/30/2012   Procedure: VIDEO BRONCHOSCOPY WITH FLUORO;  Surgeon: Reginald Stain, MD;  Location: [facility];  Service: Cardiopulmonary;  Laterality: N/A;    Family History  Problem Relation Age of Onset  . Asthma Brother   . Testicular cancer Brother   . Cancer Brother   . Breast cancer Sister     Social History   Social History  . Marital status: Married    Spouse name: N/A  . Number of children: N/A  . Years of education: N/A   Occupational History  . Consultant    Social History Main Topics  . Smoking status: Current Every Day Smoker    Packs/day: 0.75    Years: 50.00    Types: Cigarettes    Start date: 01/06/1966  . Smokeless tobacco: Never Used     Comment: Smoking less  than 1 ppd right now  . Alcohol use 3.6 oz/week    6 Cans of beer per week  . Drug use: No  . Sexual activity: Not on file   Other Topics Concern  . Not on file   Social History Narrative  . No narrative on file    History  Smoking Status  . Current Every Day Smoker  . Packs/day: 0.75  . Years: 50.00  . Types: Cigarettes  . Start date: 01/06/1966  Smokeless Tobacco  . Never Used    Comment: Smoking less than 1 ppd right now    History  Alcohol Use  . 3.6 oz/week  . 6 Cans of beer per week     No Known Allergies  Current Facility-Administered Medications  Medication Dose Route Frequency Provider Last Rate Last Dose  . lactated ringers infusion   Intravenous Continuous Reginald Battiest, MD 50 mL/hr at 08/08/16 0732      Pertinent items are noted in HPI.   Review of Systems:     Cardiac Review of Systems: Y or N  Chest Pain [ n   ]  Resting SOB [ n  ] Exertional SOB  [ n ]  Orthopnea [n  ]   Pedal Edema [ n  ]    Palpitations [n  ] Syncope  [ n ]   Presyncope [ n  ]  General Review of Systems: [Y] = yes [  ]=no Constitional: recent weight change [  ];  Wt loss over the last 3 months [   ] anorexia [  ]; fatigue [  ]; nausea [  ]; night sweats [  ]; fever [  ]; or chills [  ];          Dental: poor dentition[  ]; Last Dentist visit:   Eye : blurred vision [  ]; diplopia [   ]; vision changes [  ];  Amaurosis fugax[  ]; Resp: cough [ y ];  wheezing[  ];  hemoptysis[n  ]; shortness of breath[ n ]; paroxysmal nocturnal dyspnea[ n ]; dyspnea on exertion[  ]; or orthopnea[  ];  GI:  gallstones[  ], vomiting[  ];  dysphagia[  ]; melena[  ];  hematochezia [  ]; heartburn[  ];   Hx of  Colonoscopy[  ]; GU: kidney stones [  ]; hematuria[  ];   dysuria [  ];  nocturia[  ];  history of     obstruction [  ]; urinary frequency [  ]             Skin: rash, swelling[  ];, hair loss[  ];  peripheral edema[  ];  or itching[  ]; Musculosketetal: myalgias[  ];  joint swelling[  ];  joint  erythema[  ];  joint pain[  ];  back pain[  ];  Heme/Lymph: bruising[  ];  bleeding[  ];  anemia[  ];  Neuro: TIA[  ];  headaches[  ];  stroke[  ];  vertigo[  ];  seizures[  ];   paresthesias[  ];  difficulty walking[n  ];  Psych:depression[  ]; anxiety[  ];  Endocrine: diabetes[n  ];  thyroid dysfunction[n  ];  Immunizations: Flu up to date [  ]; Pneumococcal up to date [  ];  Other:  Physical Exam: BP 98/63   Pulse 80   Temp 98.3 F (36.8 C) (Oral)   Resp 18   Ht 5' 8"  (1.727 m)   Wt 162 lb (73.5 kg)   SpO2 93%   BMI 24.63 kg/m   PHYSICAL EXAMINATION: General appearance: alert, cooperative, appears stated age and no distress Head: Normocephalic, without obvious abnormality, atraumatic Neck: no adenopathy, no carotid bruit, no JVD, supple, symmetrical, trachea midline, thyroid not enlarged, symmetric, no tenderness/mass/nodules and Patient has very limited motion of his neck both for the right and left and also with extension and flexion Lymph nodes: Cervical, supraclavicular, and axillary nodes normal. Resp: clear to auscultation bilaterally Back: symmetric, no curvature. ROM normal. No CVA tenderness. Cardio: regular rate and rhythm, S1, S2 normal, no murmur, click, rub or gallop GI: soft, non-tender; bowel sounds normal; no masses,  no organomegaly Extremities: extremities normal, atraumatic, no cyanosis or edema Neurologic: Grossly normal  Diagnostic Studies & Laboratory data:     Recent Radiology Findings:   Dg Chest 2 View  Result Date: 08/07/2016 CLINICAL DATA:  Bronchoscopy, preop EXAM: CHEST  2 VIEW COMPARISON:  Chest CT 06/30/2016 FINDINGS: Airspace disease in the left upper lobe is increasing since prior CT, likely increasing postobstructive atelectasis. Heart is normal size. Right lung is clear. No effusions or acute bony abnormality. IMPRESSION: Increasing airspace disease in the left upper lobe, likely postobstructive atelectasis. Electronically Signed   By: Rolm Baptise M.D.   On: 08/07/2016 09:16   Nm Pet Image Restag (ps) Skull Base To Thigh  Result Date: 07/24/2016 CLINICAL DATA:  Subsequent treatment strategy for left lung cancer and left renal cell carcinoma. EXAM: NUCLEAR MEDICINE PET SKULL BASE TO THIGH TECHNIQUE: 8.1 mCi F-18 FDG was injected intravenously. Full-ring PET imaging was performed from the skull base to thigh after the radiotracer. CT data was obtained and used for attenuation correction and anatomic localization. FASTING BLOOD GLUCOSE:  Value: 102 mg/dl COMPARISON:  11/25/2012 PET-CT ; CT chest from 06/30/2016 FINDINGS: NECK No hypermetabolic lymph nodes in the neck. CHEST The left suprahilar density and left upper lobe triangular density have a maximum SUV of 3.3. The previous left suprahilar lesion prior to treatment had a maximum SUV of 24.4. I do not see any focally accentuated activity within the left suprahilar and left upper lobe airspace opacity, and the appearance favors radiation fibrosis with atelectasis and perhaps a component of radiation pneumonitis. There appears to be occlusion of the branches of  the left upper lobe tracheobronchial tree. Old granulomatous disease with subcarinal and right infrahilar calcified lymph nodes. Coronary, aortic arch, and branch vessel atherosclerotic vascular disease. ABDOMEN/PELVIS No abnormal hypermetabolic activity within the liver, pancreas, adrenal glands, or spleen. No hypermetabolic lymph nodes in the abdomen or pelvis. Left nephrectomy.  Left orchectomy. Aortoiliac atherosclerotic vascular disease.  Ventral hernia mesh. SKELETON Costosternal activity in the thorax is felt to likely be secondary to arthropathy given the configuration and appearance. Right distal clavicular deformity. No compelling findings of osseous metastatic disease. IMPRESSION: 1. Left suprahilar density with occlusion of the left upper lobe tracheobronchial tree, and triangular airspace opacity in the left upper lobe favoring  atelectasis likely with radiation fibrosis. Maximum SUV in this vicinity is 3.3, but this activity is homogeneous within the suprahilar and triangular left upper lobe density, without focally accentuated component to suggest recurrence, and this may all the therapy related. Surveillance likely warranted ; bronchoscopy to further investigate the progressive left upper lobe bronchial occlusion might be considered. 2.  Aortic Atherosclerosis (ICD10-I70.0). 3. Multifocal costosternal activity in ascribed to arthropathy. Electronically Signed   By: Van Clines M.D.   On: 07/24/2016 10:06     I have independently reviewed the above radiologic studies.  Recent Lab Findings: Lab Results  Component Value Date   WBC 9.1 08/07/2016   HGB 14.6 08/07/2016   HCT 45.9 08/07/2016   PLT 318 08/07/2016   GLUCOSE 104 (H) 08/07/2016   ALT 16 (L) 08/07/2016   AST 18 08/07/2016   NA 141 08/07/2016   K 4.9 08/07/2016   CL 106 08/07/2016   CREATININE 1.40 (H) 08/07/2016   BUN 11 08/07/2016   CO2 29 08/07/2016   INR 0.97 08/07/2016      Assessment / Plan:   Patient has a tremor density in the left upper lobe suggestive of atelectasis with narrowing of the bronchus, this area is in the target field of previous radiation but with the patient's advanced disease several years ago repeat bronchoscopy has been recommended to the patient to rule out the possibility of recurrence. The patient is agreeable with proceeding  The goals risks and alternatives of the planned surgical procedure Procedure(s): VIDEO BRONCHOSCOPY (N/A) LUNG BIOPSY (N/A)  have been discussed with the patient in detail. The risks of the procedure including death, infection, stroke, myocardial infarction, bleeding, blood transfusion have all been discussed specifically.  I have quoted [name] a 1 % of perioperative mortality and a complication rate as high as 10%. The patient's questions have been answered.[name] is  willing  to proceed with the planned procedure.  Grace Isaac MD      [address] [place] [address] Office [phone]   Beeper [phone]  08/08/2016 8:58 AM

## 2016-08-08 NOTE — Anesthesia Preprocedure Evaluation (Addendum)
Anesthesia Evaluation  Patient identified by MRN, date of birth, ID band Patient awake    Reviewed: Allergy & Precautions, NPO status , Patient's Chart, lab work & pertinent test results  Airway Mallampati: II  TM Distance: >3 FB Neck ROM: Limited    Dental  (+) Dental Advisory Given   Pulmonary sleep apnea , COPD, Current Smoker,    breath sounds clear to auscultation       Cardiovascular negative cardio ROS   Rhythm:Regular Rate:Normal     Neuro/Psych Neck fused    GI/Hepatic Neg liver ROS, Crohns   Endo/Other  negative endocrine ROS  Renal/GU Renal InsufficiencyRenal disease     Musculoskeletal  (+) Arthritis ,   Abdominal   Peds  Hematology negative hematology ROS (+)   Anesthesia Other Findings   Reproductive/Obstetrics                            Lab Results  Component Value Date   WBC 9.1 08/07/2016   HGB 14.6 08/07/2016   HCT 45.9 08/07/2016   MCV 90.4 08/07/2016   PLT 318 08/07/2016   Lab Results  Component Value Date   CREATININE 1.40 (H) 08/07/2016   BUN 11 08/07/2016   NA 141 08/07/2016   K 4.9 08/07/2016   CL 106 08/07/2016   CO2 29 08/07/2016    Anesthesia Physical Anesthesia Plan  ASA: III  Anesthesia Plan: General   Post-op Pain Management:    Induction: Intravenous  PONV Risk Score and Plan: 1 and Ondansetron and Dexamethasone  Airway Management Planned: Oral ETT  Additional Equipment:   Intra-op Plan:   Post-operative Plan: Extubation in OR  Informed Consent: I have reviewed the patients History and Physical, chart, labs and discussed the procedure including the risks, benefits and alternatives for the proposed anesthesia with the patient or authorized representative who has indicated his/her understanding and acceptance.   Dental advisory given  Plan Discussed with: CRNA  Anesthesia Plan Comments:        Anesthesia Quick  Evaluation

## 2016-08-08 NOTE — Anesthesia Procedure Notes (Signed)
Procedure Name: Intubation Date/Time: 08/08/2016 9:28 AM Performed by: Neldon Newport Pre-anesthesia Checklist: Timeout performed, Patient being monitored, Suction available, Emergency Drugs available and Patient identified Patient Re-evaluated:Patient Re-evaluated prior to induction Oxygen Delivery Method: Circle system utilized Preoxygenation: Pre-oxygenation with 100% oxygen Induction Type: IV induction Ventilation: Mask ventilation without difficulty Laryngoscope Size: Glidescope and 3 Grade View: Grade I Tube type: Oral Tube size: 8.5 mm Number of attempts: 1 Placement Confirmation: breath sounds checked- equal and bilateral,  positive ETCO2 and ETT inserted through vocal cords under direct vision Secured at: 22 cm Tube secured with: Tape Dental Injury: Teeth and Oropharynx as per pre-operative assessment

## 2016-08-09 ENCOUNTER — Encounter (HOSPITAL_COMMUNITY): Payer: Self-pay | Admitting: Cardiothoracic Surgery

## 2016-08-11 NOTE — Op Note (Signed)
[name], [name] NO.:  [id number]  MEDICAL RECORD NO.:  [id number]  LOCATION:  [facility]                         FACILITY:  [facility]  PHYSICIAN:  Lanelle Bal, MD    DATE OF BIRTH:  10/18/1950  DATE OF PROCEDURE:  08/08/2016 DATE OF DISCHARGE:                              OPERATIVE REPORT   PREOPERATIVE DIAGNOSIS:  Previous history of stage IIIA lung cancer left upper lobe, treated with chemotherapy and radiation.  POSTOPERATIVE DIAGNOSIS:  Previous history of stage IIIA lung cancer left upper lobe, treated with chemotherapy and radiation.  SURGICAL PROCEDURE:  Bronchoscopy with biopsy.  SURGEON:  Lanelle Bal, MD.  BRIEF HISTORY:  The patient is a 66 year old male, who previously was treated for stage IIIA lung cancer with radiation and chemotherapy with mass predominantly in the left upper lobe with nodal involvement.  The patient had been under surveillance CT scans.  Most recent suggested some narrowing in the left upper lobe bronchus and development of a triangular-shaped atelectasis in the left upper lobe versus mass.  This area was very mildly hypermetabolic.  There were no significant hypermetabolic lymph nodes appreciated.  CT scan did show some narrowing of the upper lobe bronchus.  The patient was referred by Oncology for repeat bronchoscopy.  Risks and options were discussed with the patient in detail, who agreed and signed informed consent.  DESCRIPTION OF PROCEDURE:  The patient underwent general endotracheal anesthesia without incident.  Appropriate time-out was performed and then, we proceeded with video bronchoscopy to the subsegmental level first on the right tracheobronchial tree without evidence of endobronchial lesions.  In the left tracheobronchial tree, the lower lobe bronchus was free of any endobronchial lesions.  There was narrowingness going into the left upper lobe bronchus, specifically the anterior segment.  The brushings  of this area were obtained.  The area was somewhat friable, and dilute epinephrine solution was applied. Biopsies of this area were also obtained and submitted to Pathology. Initial quick smear suggested atypical cells.  The remaining tissue was sent for permanent with tracheobronchial tree cleared of all secretions and blood and without active bleeding.  The scopes were removed.  The patient was then awakened and extubated in the operating room, having tolerated the procedure without obvious complication and was transferred to the recovery room for postoperative observation.     Lanelle Bal, MD                     EG/MEDQ  D:  08/11/2016  T:  08/11/2016  Job:  [id number]

## 2016-08-12 ENCOUNTER — Telehealth: Payer: Self-pay | Admitting: Cardiothoracic Surgery

## 2016-08-12 NOTE — Telephone Encounter (Signed)
Tried to call patient about path results, home and cell no response

## 2016-08-14 ENCOUNTER — Telehealth: Payer: Self-pay | Admitting: *Deleted

## 2016-08-14 NOTE — Telephone Encounter (Signed)
LATE ENTRY DUE TO EPIC BEING DOWN YESTERDAY:  On Wednesday 8/8 @ 4:30pm Dr. Servando Snare called patient to go over path report.  Patient will get a follow-up CT scan per oncology.  Patient understands and has no further questions.

## 2016-08-18 ENCOUNTER — Ambulatory Visit
Admission: RE | Admit: 2016-08-18 | Discharge: 2016-08-18 | Disposition: A | Discharge status: Home / Self Care | Payer: Medicare Other | Source: Ambulatory Visit | Attending: Cardiothoracic Surgery | Admitting: Cardiothoracic Surgery

## 2016-08-18 ENCOUNTER — Other Ambulatory Visit: Payer: Self-pay

## 2016-08-18 DIAGNOSIS — R0603 Acute respiratory distress: Secondary | ICD-10-CM

## 2016-08-19 ENCOUNTER — Telehealth: Payer: Self-pay

## 2016-08-19 NOTE — Telephone Encounter (Signed)
Patient notified of CXR results. He will call Dr Janee Morn office to see if they could move up his scheduled appt date.

## 2016-08-19 NOTE — Telephone Encounter (Signed)
-----   Message from Grace Isaac, MD sent at 08/18/2016  5:27 PM EDT ----- Regarding: RE: cxr results Slight improvement in left upper lobe aeration ----- Message ----- From: Marylen Ponto, LPN Sent: 2/47/9980   4:09 PM To: Grace Isaac, MD Subject: cxr results                                    Could you please review CXR. Patient here in the ov. Thanks SW

## 2016-08-29 ENCOUNTER — Encounter: Payer: Self-pay | Admitting: Internal Medicine

## 2016-08-29 ENCOUNTER — Ambulatory Visit (INDEPENDENT_AMBULATORY_CARE_PROVIDER_SITE_OTHER): Payer: Medicare Other | Admitting: Internal Medicine

## 2016-08-29 VITALS — BP 114/68 | HR 85 | Ht 67.5 in | Wt 164.2 lb

## 2016-08-29 DIAGNOSIS — G4733 Obstructive sleep apnea (adult) (pediatric): Secondary | ICD-10-CM | POA: Diagnosis not present

## 2016-08-29 DIAGNOSIS — F17200 Nicotine dependence, unspecified, uncomplicated: Secondary | ICD-10-CM

## 2016-08-29 DIAGNOSIS — J449 Chronic obstructive pulmonary disease, unspecified: Secondary | ICD-10-CM | POA: Diagnosis not present

## 2016-08-29 MED ORDER — GLYCOPYRROLATE-FORMOTEROL 9-4.8 MCG/ACT IN AERO: 2.0000 | INHALATION_SPRAY | Freq: Two times a day (BID) | RESPIRATORY_TRACT | 0 refills | Status: DC

## 2016-08-29 NOTE — Assessment & Plan Note (Signed)
Moderately severe obstructive sleep apnea. I suspect this is complicated by lack of mobility in his cervical spine. CPAP will probably be the most effective therapy if he can learn to sleep on his sides and back. We did discuss oral appliances as an alternative. We also emphasized the importance of a more regular sleep schedule allowing adequate sleep time. He can add melatonin and use antihistamine for sleep if needed. Plan-CPAP with auto Pap

## 2016-08-29 NOTE — Assessment & Plan Note (Signed)
He says he quit smoking 2 weeks ago. His wife is marking each successful day on a calendar and very supportive. Hopefully he can continue.

## 2016-08-29 NOTE — Assessment & Plan Note (Signed)
He had a significant exacerbation from airway irritation after bronchoscopy. I suspect his baseline is worse than he realizes. Plan-sample Bevespi, schedule PFT

## 2016-08-29 NOTE — Progress Notes (Signed)
08/29/16 66 year old male former smoker for sleep evaluation referred courtesy of Dr Corinna Capra. Pt had home sleep study and CPAP titration this year. Medical history includes tobacco use, squamous cell carcinoma left lung,stage III( XRT/chemo- followed by Dr Servando Snare) cancer of kidney, testicular cancer, COPD, Crohn's disease Home sleep test 02/26/2016- AHI 24.5/hour, desaturation to 83%, body weight 160 pounds CPAP titration study 05/21/16- titrated to 9 CWP He and wife sleep on a water bed. She says he snored worse years ago when he was heavier. He still wheezes and coughs at night. He was particularly short of breath with a lot of wheezing for several days after recent bronchoscopy. He prefers to sleep on his stomach and position is limited by stiffness in his spine and low back "I am gradually fusing my own spine". 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 Sometimes uses hydroxyzine to help himself sleep and is not interested in having a prescription sleep medicine. He continues to smoke about three quarters of a pack per day despite his complicated medical history. He admits to occasional wheeze and cough usually nonproductive. Dyspnea with active walking.  Prior to Admission medications   Medication Sig Start Date End Date Taking? Authorizing Provider  hydrOXYzine (ATARAX/VISTARIL) 50 MG tablet Take 12.5 mg by mouth at bedtime as needed (for sleep.).   Yes [provider]  Oxycodone HCl 10 MG TABS Take 10 mg by mouth 5 (five) times daily.  07/10/16  Yes [provider]  Probiotic Product (PROBIOTIC DAILY PO) Take 1 capsule by mouth daily.   Yes [provider]  varenicline (CHANTIX) 1 MG tablet Take 1 mg by mouth 2 (two) times daily.   Yes [provider]  Vitamin D-Vitamin K (D3 + K2 DOTS PO) Take 1 tablet by mouth at bedtime.   Yes [provider]  Glycopyrrolate-Formoterol  (BEVESPI AEROSPHERE) 9-4.8 MCG/ACT AERO Inhale 2 puffs into the lungs 2 (two) times daily. 08/29/16   Deneise Lever, MD   Past Medical History:  Diagnosis Date  . Arthritis   . Cancer of kidney ([place])    left  . Complication of anesthesia    Very little movement of neck- "self fusioning"  . Crohn's disease ([place])    no issues since 2012  . Difficulty sleeping    occasionally  . Dyspnea    with a little exertion,  "smoked cigarettes all my life"  . History of blood transfusion    "chron's flare"  . Lung cancer ([place])   . Renal cell carcinoma of left kidney ([place]) 02/20/2015  . Renal mass   . Renal mass, left   . Sleep apnea 05/2016  . Testicular cancer ([place]) 2000   s/p resection, no recurrence   Past Surgical History:  Procedure Laterality Date  . APPENDECTOMY    . COLONOSCOPY    . HERNIA REPAIR     x 2  . LAPAROSCOPIC NEPHRECTOMY Left 04/04/2013   Procedure: LAPAROSCOPIC LEFT RADICAL NEPHRECTOMY ;  Surgeon: [name], MD;  Location: [facility];  Service: Urology;  Laterality: Left;  . LUNG BIOPSY Left 08/08/2016   Procedure: LEFT LUNG BIOPSY;  Surgeon: Grace Isaac, MD;  Location: [facility];  Service: Thoracic;  Laterality: Left;  . testicular removal  2000  . VIDEO BRONCHOSCOPY N/A 11/30/2012   Procedure: VIDEO BRONCHOSCOPY WITH FLUORO;  Surgeon: Elsie Stain, MD;  Location: [facility];  Service: Cardiopulmonary;  Laterality: N/A;  . VIDEO BRONCHOSCOPY N/A 08/08/2016   Procedure: VIDEO BRONCHOSCOPY;  Surgeon: Grace Isaac, MD;  Location: [facility] OR;  Service: Thoracic;  Laterality: N/A;   Family History  Problem Relation Age of Onset  . Asthma Brother   . Testicular cancer Brother   . Cancer Brother   . Breast cancer Sister    Social History   Social History  . Marital status: Married    Spouse name: N/A  . Number of children: N/A  . Years of education: N/A   Occupational History  . Consultant    Social History Main Topics  . Smoking status: Former  Smoker    Packs/day: 0.75    Years: 50.00    Types: Cigarettes    Start date: 01/06/1966    Quit date: 08/15/2016  . Smokeless tobacco: Never Used     Comment: Smoking less than 1 ppd right now  . Alcohol use 3.6 oz/week    6 Cans of beer per week  . Drug use: No  . Sexual activity: Not on file   Other Topics Concern  . Not on file   Social History Narrative  . No narrative on file   ROS-see HPI   + = Positive Constitutional:    weight loss, night sweats, fevers, chills, + fatigue, lassitude. HEENT:    headaches, difficulty swallowing, tooth/dental problems, sore throat,       sneezing, itching, ear ache, nasal congestion, post nasal drip, snoring CV:    chest pain, orthopnea, PND, swelling in lower extremities, anasarca,                                                         dizziness, palpitations Resp:   + shortness of breath with exertion or at rest.                productive cough,   + non-productive cough, coughing up of blood.              change in color of mucus.  + wheezing.   Skin:    rash or lesions. GI:  No-   heartburn, indigestion, abdominal pain, nausea, vomiting, diarrhea,                 change in bowel habits, loss of appetite GU: dysuria, change in color of urine, no urgency or frequency.   flank pain. MS:   joint pain, stiffness, decreased range of motion, back pain. Neuro-     nothing unusual Psych:  change in mood or affect.  depression or anxiety.   memory loss.  OBJ- Physical Exam General- Alert, Oriented, Affect-appropriate, Distress- none acute, not obese Skin- rash-none, lesions- none, excoriation- none, full beard Lymphadenopathy- none Head- atraumatic            Eyes- Gross vision intact, PERRLA, conjunctivae and secretions clear            Ears- Hearing, canals-normal            Nose- Clear, no-Septal dev, mucus, polyps, erosion, perforation             Throat- Mallampati III , mucosa clear , drainage- none, tonsils- atrophic Neck- + very  limited ability to flex and rotate side to side , trachea midline, no stridor , thyroid nl,  carotid no bruit Chest - symmetrical excursion , unlabored           Heart/CV- RRR , no murmur , no gallop  , no rub, nl s1 s2                           - JVD- none , edema- none, stasis changes- none, varices- none           Lung- clear to P&A, wheeze- none, cough- none , dullness-none, rub- none           Chest wall-  Abd-  Br/ Gen/ Rectal- Not done, not indicated Extrem- cyanosis- none, clubbing, none, atrophy- none, strength- nl Neuro- grossly intact to observation

## 2016-08-29 NOTE — Patient Instructions (Signed)
Order- New DME, new CPAP auto 5-20, mask of choice, humidifier, supplies, Airview   Dx OSA  Suggest melatonin to help you re-establish a sleep routine  Ok to use an antihistamine to help sleep if needed  Order- Schedule PFT     Dx COPD mixed type  Sample Bevespi    Inhale 2 puffs, twice daily

## 2016-09-17 ENCOUNTER — Ambulatory Visit (INDEPENDENT_AMBULATORY_CARE_PROVIDER_SITE_OTHER): Payer: Medicare Other | Admitting: Internal Medicine

## 2016-09-17 DIAGNOSIS — J449 Chronic obstructive pulmonary disease, unspecified: Secondary | ICD-10-CM

## 2016-09-17 LAB — PULMONARY FUNCTION TEST
DL/VA % PRED: 90 %
DL/VA: 3.99 ml/min/mmHg/L
DLCO cor % pred: 71 %
DLCO cor: 20.18 ml/min/mmHg
DLCO unc % pred: 65 %
DLCO unc: 18.53 ml/min/mmHg
FEF 25-75 PRE: 0.87 L/s
FEF 25-75 Post: 1.31 L/sec
FEF2575-%CHANGE-POST: 50 %
FEF2575-%Pred-Post: 56 %
FEF2575-%Pred-Pre: 37 %
FEV1-%Change-Post: 15 %
FEV1-%PRED-PRE: 57 %
FEV1-%Pred-Post: 65 %
FEV1-PRE: 1.69 L
FEV1-Post: 1.96 L
FEV1FVC-%CHANGE-POST: 5 %
FEV1FVC-%Pred-Pre: 80 %
FEV6-%CHANGE-POST: 10 %
FEV6-%PRED-POST: 81 %
FEV6-%PRED-PRE: 73 %
FEV6-POST: 3.08 L
FEV6-Pre: 2.78 L
FEV6FVC-%Change-Post: 1 %
FEV6FVC-%PRED-PRE: 104 %
FEV6FVC-%Pred-Post: 105 %
FVC-%Change-Post: 9 %
FVC-%PRED-POST: 77 %
FVC-%PRED-PRE: 70 %
FVC-POST: 3.1 L
FVC-Pre: 2.83 L
POST FEV6/FVC RATIO: 99 %
Post FEV1/FVC ratio: 63 %
Pre FEV1/FVC ratio: 60 %
Pre FEV6/FVC Ratio: 98 %
RV % PRED: 138 %
RV: 3.1 L
TLC % pred: 95 %
TLC: 6.12 L

## 2016-09-17 NOTE — Progress Notes (Signed)
PFT completed today 09/17/16.

## 2016-12-18 ENCOUNTER — Ambulatory Visit: Payer: Medicare Other | Admitting: Internal Medicine

## 2016-12-22 ENCOUNTER — Encounter: Payer: Self-pay | Admitting: Internal Medicine

## 2016-12-22 ENCOUNTER — Ambulatory Visit: Payer: Medicare Other | Admitting: Internal Medicine

## 2016-12-22 DIAGNOSIS — G4733 Obstructive sleep apnea (adult) (pediatric): Secondary | ICD-10-CM | POA: Diagnosis not present

## 2016-12-22 DIAGNOSIS — J449 Chronic obstructive pulmonary disease, unspecified: Secondary | ICD-10-CM | POA: Diagnosis not present

## 2016-12-22 DIAGNOSIS — F17200 Nicotine dependence, unspecified, uncomplicated: Secondary | ICD-10-CM

## 2016-12-22 NOTE — Progress Notes (Signed)
08/29/16 66 year old male former smoker for sleep evaluation referred courtesy of Dr Corinna Capra. Pt had home sleep study and CPAP titration this year. Medical history includes tobacco use, squamous cell carcinoma left lung,stage III( XRT/chemo- followed by Dr Servando Snare) cancer of kidney, testicular cancer, COPD, Crohn's disease Home sleep test 02/26/2016- AHI 24.5/hour, desaturation to 83%, body weight 160 pounds CPAP titration study 05/21/16- titrated to 9 CWP He and wife sleep on a water bed. She says he snored worse years ago when he was heavier. He still wheezes and coughs at night. He was particularly short of breath with a lot of wheezing for several days after recent bronchoscopy. He prefers to sleep on his stomach and position is limited by stiffness in his spine and low back "I am gradually fusing my own spine". 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 Sometimes uses hydroxyzine to help himself sleep and is not interested in having a prescription sleep medicine. He continues to smoke about three quarters of a pack per day despite his complicated medical history. He admits to occasional wheeze and cough usually nonproductive. Dyspnea with active walking.  12/22/16-66 year old male smoker followed for OSA, complicated by Medical history includes tobacco use, squamous cell carcinoma left lung,stage III( XRT/chemo- followed by Dr Servando Snare) cancer of kidney, testicular cancer, COPD, Crohn's disease CPAP auto 5-20/[employer] ----follow up for OSA, sleep study Aug/Sept 2018 Comes with wife.  Denies acute respiratory issues.  Little routine cough. PFT reviewed showing early severe obstructive airways disease with some response to bronchodilator-discussed. He did not start CPAP although it was ordered and admits frankly that he does not intend to use it.  He offers various explanations.  We discussed medical concerns of untreated  sleep apnea with his wife present. PFT: 09/17/16-moderately severe obstructive airways disease with response to bronchodilator.  Normal total lung capacity.  Mild diffusion defect.  ROS-see HPI   + = Positive Constitutional:    weight loss, night sweats, fevers, chills, + fatigue, lassitude. HEENT:    headaches, difficulty swallowing, tooth/dental problems, sore throat,       sneezing, itching, ear ache, nasal congestion, post nasal drip, snoring CV:    chest pain, orthopnea, PND, swelling in lower extremities, anasarca,                                                         dizziness, palpitations Resp:   + shortness of breath with exertion or at rest.                productive cough,   + non-productive cough, coughing up of blood.              change in color of mucus.  + wheezing.   Skin:    rash or lesions. GI:  No-   heartburn, indigestion, abdominal pain, nausea, vomiting, diarrhea,                 change in bowel habits, loss of appetite GU: dysuria, change in color of urine, no urgency or frequency.   flank pain. MS:   joint pain, stiffness, decreased range of motion, back pain. Neuro-     nothing unusual Psych:  change in mood or affect.  depression or anxiety.  memory loss.  OBJ- Physical Exam General- Alert, Oriented, Affect-appropriate, Distress- none acute, not obese Skin- rash-none, lesions- none, excoriation- none, full beard Lymphadenopathy- none Head- atraumatic            Eyes- Gross vision intact, PERRLA, conjunctivae and secretions clear            Ears- Hearing, canals-normal            Nose- Clear, no-Septal dev, mucus, polyps, erosion, perforation             Throat- Mallampati III , mucosa clear , drainage- none, tonsils- atrophic Neck- + very limited ability to flex and rotate side to side , trachea midline, no stridor , thyroid nl, carotid no bruit Chest - symmetrical excursion , unlabored           Heart/CV- RRR , no murmur , no gallop  , no rub, nl s1 s2                            - JVD- none , edema- none, stasis changes- none, varices- none           Lung- clear to P&A, wheeze- none, cough- none , dullness-none, rub- none           Chest wall-  Abd-  Br/ Gen/ Rectal- Not done, not indicated Extrem- cyanosis- none, clubbing, none, atrophy- none, strength- nl Neuro- grossly intact to observation

## 2016-12-22 NOTE — Patient Instructions (Signed)
If you decide you would like to revisit the sleep apnea issue, you are welcome to call us back.

## 2016-12-23 NOTE — Assessment & Plan Note (Signed)
Notably asymptomatic from moderately severe obstructive airways disease complicated by history of left lung stage III squamous cell carcinoma. If he wishes our help, I would offer trial of a maintenance bronchodilator.  He can choose to get that from his primary physician if he wishes.  He may also be a candidate for pulmonary rehabilitation.

## 2016-12-23 NOTE — Assessment & Plan Note (Signed)
Despite associated medical problems and counseling, he does not choose to try to stop smoking at this time.

## 2016-12-23 NOTE — Assessment & Plan Note (Signed)
I had hoped he would be willing to try CPAP but he is not.  We also discussed fitted oral appliances but he does not want that either.  Those options remain open.  He and his wife understand that he is welcome to let us know if he changes his mind.

## 2017-01-26 ENCOUNTER — Inpatient Hospital Stay: Payer: Medicare Other | Attending: Internal Medicine

## 2017-01-26 ENCOUNTER — Ambulatory Visit (HOSPITAL_COMMUNITY)
Admission: RE | Admit: 2017-01-26 | Discharge: 2017-01-26 | Disposition: A | Discharge status: Home / Self Care | Payer: Medicare Other | Source: Ambulatory Visit | Attending: Internal Medicine | Admitting: Internal Medicine

## 2017-01-26 DIAGNOSIS — J4489 Other specified chronic obstructive pulmonary disease: Secondary | ICD-10-CM

## 2017-01-26 DIAGNOSIS — Z85528 Personal history of other malignant neoplasm of kidney: Secondary | ICD-10-CM | POA: Diagnosis not present

## 2017-01-26 DIAGNOSIS — Z905 Acquired absence of kidney: Secondary | ICD-10-CM | POA: Insufficient documentation

## 2017-01-26 DIAGNOSIS — Z85118 Personal history of other malignant neoplasm of bronchus and lung: Secondary | ICD-10-CM | POA: Diagnosis not present

## 2017-01-26 DIAGNOSIS — I2510 Atherosclerotic heart disease of native coronary artery without angina pectoris: Secondary | ICD-10-CM | POA: Insufficient documentation

## 2017-01-26 DIAGNOSIS — Z923 Personal history of irradiation: Secondary | ICD-10-CM | POA: Insufficient documentation

## 2017-01-26 DIAGNOSIS — I700 Atherosclerosis of aorta: Secondary | ICD-10-CM | POA: Insufficient documentation

## 2017-01-26 DIAGNOSIS — C3492 Malignant neoplasm of unspecified part of left bronchus or lung: Secondary | ICD-10-CM | POA: Insufficient documentation

## 2017-01-26 DIAGNOSIS — J449 Chronic obstructive pulmonary disease, unspecified: Secondary | ICD-10-CM

## 2017-01-26 DIAGNOSIS — R918 Other nonspecific abnormal finding of lung field: Secondary | ICD-10-CM | POA: Insufficient documentation

## 2017-01-26 DIAGNOSIS — K5090 Crohn's disease, unspecified, without complications: Secondary | ICD-10-CM | POA: Diagnosis not present

## 2017-01-26 DIAGNOSIS — Z79899 Other long term (current) drug therapy: Secondary | ICD-10-CM | POA: Insufficient documentation

## 2017-01-26 DIAGNOSIS — Z9221 Personal history of antineoplastic chemotherapy: Secondary | ICD-10-CM | POA: Diagnosis not present

## 2017-01-26 LAB — COMPREHENSIVE METABOLIC PANEL
ALBUMIN: 3.5 g/dL (ref 3.5–5.0)
ALK PHOS: 119 U/L (ref 40–150)
ALT: 14 U/L (ref 0–55)
AST: 12 U/L (ref 5–34)
Anion gap: 10 (ref 3–11)
BILIRUBIN TOTAL: 0.4 mg/dL (ref 0.2–1.2)
BUN: 16 mg/dL (ref 7–26)
CALCIUM: 9.4 mg/dL (ref 8.4–10.4)
CO2: 25 mmol/L (ref 22–29)
CREATININE: 1.37 mg/dL — AB (ref 0.70–1.30)
Chloride: 104 mmol/L (ref 98–109)
GFR calc Af Amer: 60 mL/min — ABNORMAL LOW (ref >60)
GFR calc non Af Amer: 51 mL/min — ABNORMAL LOW (ref >60)
GLUCOSE: 90 mg/dL (ref 70–140)
Potassium: 4.8 mmol/L (ref 3.5–5.1)
Sodium: 139 mmol/L (ref 136–145)
TOTAL PROTEIN: 7.1 g/dL (ref 6.4–8.3)

## 2017-01-26 LAB — CBC WITH DIFFERENTIAL/PLATELET
BASOS ABS: 0 10*3/uL (ref 0.0–0.1)
BASOS PCT: 0 %
Eosinophils Absolute: 0.2 10*3/uL (ref 0.0–0.5)
Eosinophils Relative: 2 %
HCT: 45.2 % (ref 38.4–49.9)
Hemoglobin: 14.6 g/dL (ref 13.0–17.1)
Lymphocytes Relative: 15 %
Lymphs Abs: 1.4 10*3/uL (ref 0.9–3.3)
MCH: 29.5 pg (ref 27.2–33.4)
MCHC: 32.3 g/dL (ref 32.0–36.0)
MCV: 91.3 fL (ref 79.3–98.0)
MONO ABS: 0.6 10*3/uL (ref 0.1–0.9)
Monocytes Relative: 7 %
Neutro Abs: 6.7 10*3/uL — ABNORMAL HIGH (ref 1.5–6.5)
Neutrophils Relative %: 76 %
Platelets: 301 10*3/uL (ref 140–400)
RBC: 4.95 MIL/uL (ref 4.20–5.82)
RDW: 14.8 % (ref 11.0–15.6)
WBC: 8.9 10*3/uL (ref 4.0–10.3)

## 2017-02-02 ENCOUNTER — Telehealth: Payer: Self-pay | Admitting: Internal Medicine

## 2017-02-02 ENCOUNTER — Inpatient Hospital Stay (HOSPITAL_BASED_OUTPATIENT_CLINIC_OR_DEPARTMENT_OTHER): Payer: Medicare Other | Admitting: Internal Medicine

## 2017-02-02 ENCOUNTER — Encounter: Payer: Self-pay | Admitting: Internal Medicine

## 2017-02-02 DIAGNOSIS — Z85528 Personal history of other malignant neoplasm of kidney: Secondary | ICD-10-CM | POA: Diagnosis not present

## 2017-02-02 DIAGNOSIS — Z905 Acquired absence of kidney: Secondary | ICD-10-CM | POA: Diagnosis not present

## 2017-02-02 DIAGNOSIS — Z9221 Personal history of antineoplastic chemotherapy: Secondary | ICD-10-CM | POA: Diagnosis not present

## 2017-02-02 DIAGNOSIS — Z923 Personal history of irradiation: Secondary | ICD-10-CM

## 2017-02-02 DIAGNOSIS — C3490 Malignant neoplasm of unspecified part of unspecified bronchus or lung: Secondary | ICD-10-CM

## 2017-02-02 DIAGNOSIS — Z85118 Personal history of other malignant neoplasm of bronchus and lung: Secondary | ICD-10-CM | POA: Diagnosis not present

## 2017-02-02 NOTE — Progress Notes (Signed)
[facility] [phone]   Fax:([phone]  OFFICE PROGRESS NOTE  [address], [place] [address]  DIAGNOSIS:  1) Stage IIIA (T2a, N2, M0) non-small cell lung cancer consistent with squamous cell carcinoma involving the left upper lobe and AP window lymphadenopathy diagnosed in November 2014. 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 2) Status post left laparoscopic radical nephrectomy under the care of Dr. Louis Meckel on 04/04/2013.  CURRENT THERAPY: Observation.  INTERVAL HISTORY: [name] 67 y.o. male returns to the clinic today for follow-up visit accompanied by his wife.  The patient has no complaints today except for mild fatigue.  They lost their dog few weeks ago and he was a little bit sad about it.  He denied having any current chest pain but continues to have shortness of breath with exertion with mild cough but no hemoptysis.  He denied having any recent weight loss or night sweats.  He has no nausea, vomiting, diarrhea or constipation.  He underwent repeat bronchoscopy by Dr. Servando Snare few months ago and there was no clear evidence for recurrent malignancy.  The patient had a repeat CT scan of the chest performed recently and he is here for evaluation and discussion of his discuss results.  MEDICAL HISTORY: Past Medical History:  Diagnosis Date  . Arthritis   . Cancer of kidney ([place])    left  . Complication of anesthesia    Very little movement of neck- "self fusioning"  . Crohn's disease ([place])    no issues since 2012  . Difficulty sleeping    occasionally  . Dyspnea    with a little exertion,  "smoked cigarettes all my life"  . History of blood transfusion    "chron's  flare"  . Lung cancer ([place])   . Renal cell carcinoma of left kidney ([place]) 02/20/2015  . Renal mass   . Renal mass, left   . Sleep apnea 05/2016  . Testicular cancer ([place]) 2000   s/p resection, no recurrence    ALLERGIES:  has No Known Allergies.  MEDICATIONS:  Current Outpatient Medications  Medication Sig Dispense Refill  . hydrOXYzine (ATARAX/VISTARIL) 50 MG tablet Take 12.5 mg by mouth at bedtime as needed (for sleep.).    Marland Kitchen Oxycodone HCl 10 MG TABS Take 10 mg by mouth 5 (five) times daily.     . Probiotic Product (PROBIOTIC DAILY PO) Take 1 capsule by mouth daily.    . Vitamin D-Vitamin K (D3 + K2 DOTS PO) Take 1 tablet by mouth at bedtime.     No current facility-administered medications for this visit.     SURGICAL HISTORY:  Past Surgical History:  Procedure Laterality Date  . APPENDECTOMY    . COLONOSCOPY    . HERNIA REPAIR     x 2  . LAPAROSCOPIC NEPHRECTOMY Left 04/04/2013   Procedure: LAPAROSCOPIC LEFT RADICAL NEPHRECTOMY ;  Surgeon: [name], MD;  Location: [facility];  Service: Urology;  Laterality: Left;  . LUNG BIOPSY Left 08/08/2016   Procedure: LEFT LUNG BIOPSY;  Surgeon: Grace Isaac, MD;  Location: [facility];  Service: Thoracic;  Laterality: Left;  . testicular removal  2000  . VIDEO BRONCHOSCOPY N/A 11/30/2012   Procedure:  VIDEO BRONCHOSCOPY WITH FLUORO;  Surgeon: Elsie Stain, MD;  Location: [facility];  Service: Cardiopulmonary;  Laterality: N/A;  . VIDEO BRONCHOSCOPY N/A 08/08/2016   Procedure: VIDEO BRONCHOSCOPY;  Surgeon: Grace Isaac, MD;  Location: [facility];  Service: Thoracic;  Laterality: N/A;    REVIEW OF SYSTEMS:  A comprehensive review of systems was negative except for: Respiratory: positive for cough and dyspnea on exertion   PHYSICAL EXAMINATION: General appearance: alert, cooperative and no distress Head: Normocephalic, without obvious abnormality, atraumatic Neck: no adenopathy, no JVD, supple, symmetrical, trachea midline  and thyroid not enlarged, symmetric, no tenderness/mass/nodules Lymph nodes: Cervical, supraclavicular, and axillary nodes normal. Resp: clear to auscultation bilaterally Back: symmetric, no curvature. ROM normal. No CVA tenderness. Cardio: regular rate and rhythm, S1, S2 normal, no murmur, click, rub or gallop GI: soft, non-tender; bowel sounds normal; no masses,  no organomegaly Extremities: extremities normal, atraumatic, no cyanosis or edema  ECOG PERFORMANCE STATUS: 1 - Symptomatic but completely ambulatory  Blood pressure 120/68, pulse 84, temperature 98.1 F (36.7 C), temperature source Oral, resp. rate 18, height 5' 7"  (1.702 m), weight 165 lb 9.6 oz (75.1 kg), SpO2 99 %.  LABORATORY DATA: Lab Results  Component Value Date   WBC 8.9 01/26/2017   HGB 14.6 01/26/2017   HCT 45.2 01/26/2017   MCV 91.3 01/26/2017   PLT 301 01/26/2017      Chemistry      Component Value Date/Time   NA 139 01/26/2017 1502   NA 142 06/30/2016 1406   K 4.8 01/26/2017 1502   K 4.8 06/30/2016 1406   CL 104 01/26/2017 1502   CO2 25 01/26/2017 1502   CO2 29 06/30/2016 1406   BUN 16 01/26/2017 1502   BUN 14.8 06/30/2016 1406   CREATININE 1.37 (H) 01/26/2017 1502   CREATININE 1.4 (H) 06/30/2016 1406      Component Value Date/Time   CALCIUM 9.4 01/26/2017 1502   CALCIUM 9.8 06/30/2016 1406   ALKPHOS 119 01/26/2017 1502   ALKPHOS 122 06/30/2016 1406   AST 12 01/26/2017 1502   AST 11 06/30/2016 1406   ALT 14 01/26/2017 1502   ALT 13 06/30/2016 1406   BILITOT 0.4 01/26/2017 1502   BILITOT 0.40 06/30/2016 1406       RADIOGRAPHIC STUDIES: Ct Chest Wo Contrast  Result Date: 01/26/2017 CLINICAL DATA:  Followup lung cancer. EXAM: CT CHEST WITHOUT CONTRAST TECHNIQUE: Multidetector CT imaging of the chest was performed following the standard protocol without IV contrast. COMPARISON:  06/30/2016 FINDINGS: Cardiovascular: The heart size is within normal limits. No pericardial effusion. Aortic  atherosclerosis noted. Calcifications within the LAD coronary artery noted. Mediastinum/Nodes: The trachea appears patent and is midline. Normal appearance of the esophagus. Calcified subcarinal and right hilar lymph nodes are again noted. Lungs/Pleura: The left upper lobe area of masslike architectural distortion is again identified. This measures 4.6 by 2.9 cm, image 44 of series 5. Previously 4.4 by 4.0 cm. Progressive peripheral consolidation/atelectasis and volume loss within the left upper lobe noted when compared with previous studies. Increased soft tissue within the left hilar region has an AP diameter of 5.4 cm, image 55 of series 5. Similar to previous exam. Also similar to the previous exam is left upper lobe bronchial occlusion, image 58 of series 5. No pleural effusion identified. No pulmonary nodules identified to suggest metastatic disease within the remainder of the lungs. Upper Abdomen: No acute abnormality identified. Status post left nephrectomy. Calcified granulomas identified within the spleen. Musculoskeletal:  No aggressive lytic or sclerotic bone lesions. IMPRESSION: 1. Masslike area of architectural distortion within the left upper lobe and increased left perihilar soft tissue has a similar volume to the PET-CT from 07/24/2016. Associated left upper lobe bronchial occlusion is again noted. New from the previous exam is progressive consolidation/atelectasis with volume loss from the left upper lobe. As mentioned previously, given the nonspecific FDG uptake on previous PET-CT findings may reflect changes secondary to external beam radiation. Underlying residual/recurrent tumor cannot be excluded however. 2. No specific findings identified to suggest thoracic metastatic disease. 3. Prior granulomatous disease 4. Aortic Atherosclerosis (ICD10-I70.0). Lad coronary artery calcifications noted. Electronically Signed   By: Kerby Moors M.D.   On: 01/26/2017 16:06    ASSESSMENT AND PLAN:  This  is a very pleasant 67 years old white male with history of stage IIIa non-small cell lung cancer status post concurrent chemoradiation with weekly carboplatin and paclitaxel followed by observation since the patient was noted good candidate for consolidation chemotherapy at that time. He also has a history of renal cell carcinoma status post left radical nephrectomy. The patient has been in observation for close to 4 years now. He is feeling fine in the recent CT scan of the chest showed no clear evidence for disease progression but there was increased left perihilar soft tissue suspicious for progressive consolidation/atelectasis. I personally and independently reviewed the scan images and discussed the results with the patient and his wife and showed them the images. I recommended for the patient to continue on observation with repeat CT scan of the chest in 4 months for reevaluation of his disease and to rule out any disease progression. He was advised to call immediately if he has any concerning symptoms in the interval.  Disclaimer: This note was dictated with voice recognition software. Similar sounding words can inadvertently be transcribed and may not be corrected upon review.

## 2017-02-02 NOTE — Telephone Encounter (Signed)
Scheduled appt per 1/28 los - Gave patient AVS and calender per los.  

## 2017-04-23 ENCOUNTER — Other Ambulatory Visit: Payer: Self-pay | Admitting: *Deleted

## 2017-04-23 DIAGNOSIS — C642 Malignant neoplasm of left kidney, except renal pelvis: Secondary | ICD-10-CM

## 2017-05-15 ENCOUNTER — Telehealth: Payer: Self-pay | Admitting: *Deleted

## 2017-05-15 NOTE — Telephone Encounter (Signed)
Oncology Nurse Navigator Documentation  Oncology Nurse Navigator Flowsheets 05/15/2017  Navigator Location [facility]  Navigator Encounter Type Telephone/patient called. He had questions about his appts, I clarified.   Telephone Incoming Call  Treatment Phase Other  Barriers/Navigation Needs Education  Education Other  Interventions Education  Education Method Verbal  Acuity Level 1  Time Spent with Patient 15

## 2017-05-29 ENCOUNTER — Inpatient Hospital Stay: Payer: Medicare Other | Attending: Internal Medicine

## 2017-05-29 ENCOUNTER — Ambulatory Visit (HOSPITAL_COMMUNITY)
Admission: RE | Admit: 2017-05-29 | Discharge: 2017-05-29 | Disposition: A | Discharge status: Home / Self Care | Payer: Medicare Other | Source: Ambulatory Visit | Attending: Internal Medicine | Admitting: Internal Medicine

## 2017-05-29 DIAGNOSIS — C3412 Malignant neoplasm of upper lobe, left bronchus or lung: Secondary | ICD-10-CM | POA: Insufficient documentation

## 2017-05-29 DIAGNOSIS — I2584 Coronary atherosclerosis due to calcified coronary lesion: Secondary | ICD-10-CM | POA: Insufficient documentation

## 2017-05-29 DIAGNOSIS — I2510 Atherosclerotic heart disease of native coronary artery without angina pectoris: Secondary | ICD-10-CM | POA: Insufficient documentation

## 2017-05-29 DIAGNOSIS — Z905 Acquired absence of kidney: Secondary | ICD-10-CM | POA: Insufficient documentation

## 2017-05-29 DIAGNOSIS — Z923 Personal history of irradiation: Secondary | ICD-10-CM | POA: Insufficient documentation

## 2017-05-29 DIAGNOSIS — Z9221 Personal history of antineoplastic chemotherapy: Secondary | ICD-10-CM | POA: Insufficient documentation

## 2017-05-29 DIAGNOSIS — I700 Atherosclerosis of aorta: Secondary | ICD-10-CM | POA: Diagnosis not present

## 2017-05-29 DIAGNOSIS — Z85528 Personal history of other malignant neoplasm of kidney: Secondary | ICD-10-CM | POA: Insufficient documentation

## 2017-05-29 DIAGNOSIS — J9811 Atelectasis: Secondary | ICD-10-CM | POA: Diagnosis not present

## 2017-05-29 DIAGNOSIS — F17210 Nicotine dependence, cigarettes, uncomplicated: Secondary | ICD-10-CM | POA: Insufficient documentation

## 2017-05-29 DIAGNOSIS — C642 Malignant neoplasm of left kidney, except renal pelvis: Secondary | ICD-10-CM | POA: Insufficient documentation

## 2017-06-02 ENCOUNTER — Ambulatory Visit: Payer: Medicare Other | Admitting: Internal Medicine

## 2017-06-03 ENCOUNTER — Inpatient Hospital Stay: Payer: Medicare Other | Admitting: Oncology

## 2017-06-03 ENCOUNTER — Encounter: Payer: Self-pay | Admitting: Oncology

## 2017-06-03 ENCOUNTER — Ambulatory Visit: Payer: Medicare Other | Admitting: Internal Medicine

## 2017-06-03 VITALS — BP 116/76 | HR 92 | Temp 98.6°F | Resp 18 | Ht 67.0 in | Wt 166.0 lb

## 2017-06-03 DIAGNOSIS — Z923 Personal history of irradiation: Secondary | ICD-10-CM

## 2017-06-03 DIAGNOSIS — C3412 Malignant neoplasm of upper lobe, left bronchus or lung: Secondary | ICD-10-CM | POA: Diagnosis present

## 2017-06-03 DIAGNOSIS — C3492 Malignant neoplasm of unspecified part of left bronchus or lung: Secondary | ICD-10-CM

## 2017-06-03 DIAGNOSIS — Z9221 Personal history of antineoplastic chemotherapy: Secondary | ICD-10-CM

## 2017-06-03 DIAGNOSIS — Z85528 Personal history of other malignant neoplasm of kidney: Secondary | ICD-10-CM | POA: Diagnosis not present

## 2017-06-03 DIAGNOSIS — F17210 Nicotine dependence, cigarettes, uncomplicated: Secondary | ICD-10-CM | POA: Diagnosis not present

## 2017-06-03 DIAGNOSIS — Z905 Acquired absence of kidney: Secondary | ICD-10-CM

## 2017-06-03 NOTE — Progress Notes (Signed)
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 2) Status post left laparoscopic radical nephrectomy under the care of Dr. Louis Meckel on 04/04/2013.  CURRENT THERAPY: Observation.  INTERVAL HISTORY: [name] 67 y.o. male returns for routine follow-up visit accompanied by his wife.  The patient is feeling fine today and has no specific complaints except for his baseline shortness of breath.  The patient denies fevers and chills.  Denies chest pain, cough, hemoptysis.  Denies nausea, vomiting, constipation, diarrhea.  Denies recent weight loss or night sweats.  The patient had a recent restaging CT scan and is here to discuss the results.  MEDICAL HISTORY: Past Medical History:  Diagnosis Date  . Arthritis   . Cancer of kidney ([place])    left  . Complication of anesthesia    Very little movement of neck- "self fusioning"  . Crohn's disease ([place])    no issues since 2012  . Difficulty sleeping    occasionally  . Dyspnea    with a little exertion,  "smoked cigarettes all my life"  . History of blood transfusion    "chron's flare"  . Lung cancer ([place])   . Renal cell carcinoma of left kidney ([place]) 02/20/2015  . Renal mass   . Renal mass, left   . Sleep apnea 05/2016  . Testicular cancer ([place]) 2000   s/p resection, no recurrence    ALLERGIES:  has No Known Allergies.  MEDICATIONS:  Current Outpatient Medications  Medication Sig Dispense Refill  .  hydrOXYzine (ATARAX/VISTARIL) 50 MG tablet Take 12.5 mg by mouth at bedtime as needed (for sleep.).    Marland Kitchen Oxycodone HCl 10 MG TABS Take 10 mg by mouth 5 (five) times daily.     . Probiotic Product (PROBIOTIC DAILY PO) Take 1 capsule by mouth daily.    . Vitamin D-Vitamin K (D3 + K2 DOTS PO) Take 1 tablet by mouth at bedtime.     No current facility-administered medications for this visit.     SURGICAL HISTORY:  Past Surgical History:  Procedure Laterality Date  . APPENDECTOMY    . COLONOSCOPY    . HERNIA REPAIR     x 2  . LAPAROSCOPIC NEPHRECTOMY Left 04/04/2013   Procedure: LAPAROSCOPIC LEFT RADICAL NEPHRECTOMY ;  Surgeon: [name], MD;  Location: [facility];  Service: Urology;  Laterality: Left;  . LUNG BIOPSY Left 08/08/2016   Procedure: LEFT LUNG BIOPSY;  Surgeon: Grace Isaac, MD;  Location: [facility];  Service: Thoracic;  Laterality: Left;  . testicular removal  2000  . VIDEO BRONCHOSCOPY N/A 11/30/2012   Procedure: VIDEO BRONCHOSCOPY WITH FLUORO;  Surgeon: Elsie Stain, MD;  Location: [facility];  Service: Cardiopulmonary;  Laterality: N/A;  . VIDEO BRONCHOSCOPY N/A 08/08/2016   Procedure: VIDEO BRONCHOSCOPY;  Surgeon: Grace Isaac, MD;  Location: [facility] OR;  Service: Thoracic;  Laterality: N/A;    REVIEW OF SYSTEMS:   Review of Systems  Constitutional: Negative for appetite change, chills, fatigue, fever and unexpected  weight change.  HENT:   Negative for mouth sores, nosebleeds, sore throat and trouble swallowing.   Eyes: Negative for eye problems and icterus.  Respiratory: Negative for cough, hemoptysis, and wheezing.  Positive for his baseline shortness of breath.  Cardiovascular: Negative for chest pain and leg swelling.  Gastrointestinal: Negative for abdominal pain, constipation, diarrhea, nausea and vomiting.  Genitourinary: Negative for bladder incontinence, difficulty urinating, dysuria, frequency and hematuria.   Musculoskeletal: Negative for back pain,  gait problem, neck pain and neck stiffness.  Skin: Negative for itching and rash.  Neurological: Negative for dizziness, extremity weakness, gait problem, headaches, light-headedness and seizures.  Hematological: Negative for adenopathy. Does not bruise/bleed easily.  Psychiatric/Behavioral: Negative for confusion, depression and sleep disturbance. The patient is not nervous/anxious.     PHYSICAL EXAMINATION:  Blood pressure 116/76, pulse 92, temperature 98.6 F (37 C), temperature source Oral, resp. rate 18, height 5' 7"  (1.702 m), weight 166 lb (75.3 kg), SpO2 97 %.  ECOG PERFORMANCE STATUS: 1 - Symptomatic but completely ambulatory  Physical Exam  Constitutional: Oriented to person, place, and time and well-developed, well-nourished, and in no distress. No distress.  HENT:  Head: Normocephalic and atraumatic.  Mouth/Throat: Oropharynx is clear and moist. No oropharyngeal exudate.  Eyes: Conjunctivae are normal. Right eye exhibits no discharge. Left eye exhibits no discharge. No scleral icterus.  Neck: Normal range of motion. Neck supple.  Cardiovascular: Normal rate, regular rhythm, normal heart sounds and intact distal pulses.   Pulmonary/Chest: Effort normal and breath sounds normal. No respiratory distress. No wheezes. No rales.  Abdominal: Soft. Bowel sounds are normal. Exhibits no distension and no mass. There is no tenderness.  Musculoskeletal: Normal range of motion. Exhibits no edema.  Lymphadenopathy:    No cervical adenopathy.  Neurological: Alert and oriented to person, place, and time. Exhibits normal muscle tone. Gait normal. Coordination normal.  Skin: Skin is warm and dry. No rash noted. Not diaphoretic. No erythema. No pallor.  Psychiatric: Mood, memory and judgment normal.  Vitals reviewed.  LABORATORY DATA: Lab Results  Component Value Date   WBC 8.9 01/26/2017   HGB 14.6 01/26/2017   HCT 45.2 01/26/2017   MCV 91.3 01/26/2017   PLT 301 01/26/2017       Chemistry      Component Value Date/Time   NA 139 01/26/2017 1502   NA 142 06/30/2016 1406   K 4.8 01/26/2017 1502   K 4.8 06/30/2016 1406   CL 104 01/26/2017 1502   CO2 25 01/26/2017 1502   CO2 29 06/30/2016 1406   BUN 16 01/26/2017 1502   BUN 14.8 06/30/2016 1406   CREATININE 1.37 (H) 01/26/2017 1502   CREATININE 1.4 (H) 06/30/2016 1406      Component Value Date/Time   CALCIUM 9.4 01/26/2017 1502   CALCIUM 9.8 06/30/2016 1406   ALKPHOS 119 01/26/2017 1502   ALKPHOS 122 06/30/2016 1406   AST 12 01/26/2017 1502   AST 11 06/30/2016 1406   ALT 14 01/26/2017 1502   ALT 13 06/30/2016 1406   BILITOT 0.4 01/26/2017 1502   BILITOT 0.40 06/30/2016 1406       RADIOGRAPHIC STUDIES:  Ct Chest Wo Contrast  Result Date: 05/30/2017 CLINICAL DATA:  Restaging renal cell carcinoma with lung metastasis. EXAM: CT CHEST WITHOUT CONTRAST TECHNIQUE: Multidetector CT imaging of the chest was performed following the standard protocol without IV contrast. COMPARISON:  01/26/2017 FINDINGS: Cardiovascular: The heart size appears normal. No pericardial effusion. Aortic atherosclerosis. Calcifications within the LAD coronary  arteries noted. Mediastinum/Nodes: Normal appearance of the thyroid gland. Calcified subcarinal and right hilar lymph nodes identified. No mediastinal or hilar adenopathy identified. Lungs/Pleura: No pleural effusion. Mild changes of emphysema. There is been progressive atelectasis and volume loss from the left upper lobe. The lung mass is difficult to separate from atelectatic lung. Best estimate is that the central obstructing mass measures approximately 5.6 x 3.4 cm, image 54/2. Previously 4.6 x 2.9 cm. No new pulmonary nodules or masses identified. Upper Abdomen: No acute abnormality identified within the upper abdomen. Status post left nephrectomy. Musculoskeletal: Degenerative disc disease within the thoracic spine. No aggressive lytic or sclerotic bone lesions. IMPRESSION: 1. There  is been interval progressive atelectasis and volume loss of the left upper lobe compared with 01/26/2017. It is difficult to separate the obstructing mass from atelectatic lung. Best estimate is that the central obstructing mass has increased in size in the interval. 2. Aortic Atherosclerosis (ICD10-I70.0). Lad coronary artery atherosclerotic calcifications noted. Electronically Signed   By: Kerby Moors M.D.   On: 05/30/2017 12:04     ASSESSMENT/PLAN:  Cancer of left lung parenchyma [facility]) This is a very pleasant 67 year old white male with history of stage IIIa non-small cell lung cancer status post concurrent chemoradiation with weekly carboplatin and paclitaxel followed by observation since the patient was noted good candidate for consolidation chemotherapy at that time. He also has a history of renal cell carcinoma status post left radical nephrectomy. The patient has been in observation for close to 4 years now. He had a recent restaging CT scan of the chest and is here to discuss the results.  The patient was seen with Dr. Julien Nordmann.  CT scan results were discussed with the patient and his wife which show some interval progression of the atelectasis compared to the prior scan.  There is no clear-cut evidence of disease progression.  Discussed with the patient that he has had a prior PET scan and a repeat biopsy both of which were negative.  We discussed continuing on with observation with a repeat CT scan in approximately 3 months.  The patient is in agreement with this plan.  He was advised to contact us if he has any increase in his shortness of breath or hemoptysis.  The patient is in agreement with this plan.  He was advised to call immediately if he has any concerning symptoms in the interval.   Orders Placed This Encounter  Procedures  . CT CHEST WO CONTRAST    Standing Status:   Future    Standing Expiration Date:   06/04/2018    Order Specific Question:   Preferred imaging location?     Answer:   [facility]    Order Specific Question:   Radiology Contrast Protocol - do NOT remove file path    Answer:   [email or web address]    Order Specific Question:   Reason for Exam additional comments    Answer:   Lung cancer. Restaging.  Marland Kitchen CBC with Differential (Cancer Center Only)    Standing Status:   Future    Standing Expiration Date:   06/04/2018  . CMP ([facility] only)    Standing Status:   Future    Standing Expiration Date:   06/04/2018   Mikey Bussing, DNP, AGPCNP-BC, AOCNP 06/03/17  ADDENDUM: Hematology/Oncology Attending: I had a face-to-face encounter with the patient.  I recommended his care plan.  This is a very pleasant 67 years old white male with a stage IIIA  non-small cell lung cancer status post a course of concurrent chemoradiation.  He has been in observation since that time.  The patient was not a good candidate for consolidation chemotherapy by the end of his treatment because of comorbidities. He is feeling fine today with no specific complaints except for the persistent shortness of breath.  Unfortunately he continues to smoke at regular basis. He had repeat CT scan of the chest performed recently that showed further increase in the consolidative lesion and the left upper lobe.  This was evaluated last year with a PET scan as well as bronchoscopy that were unremarkable for any recurrent malignancy. I personally and independently reviewed the scan images and discussed the results with the patient and his wife.  I recommended for him to continue on observation with repeat CT scan of the chest in 3 months for reevaluation of his disease. We will consider sending him back to Dr. Servando Snare for repeat bronchoscopy if he continues to have further increase in the size of the obstructive lesion and the left upper lobe. I also strongly advised the patient to quit smoking. He was advised to call immediately if he has any concerning  symptoms in the interval.  Disclaimer: This note was dictated with voice recognition software. Similar sounding words can inadvertently be transcribed and may be missed upon review. Eilleen Kempf, MD 06/05/17

## 2017-06-04 NOTE — Assessment & Plan Note (Signed)
This is a very pleasant 67 year old white male with history of stage IIIa non-small cell lung cancer status post concurrent chemoradiation with weekly carboplatin and paclitaxel followed by observation since the patient was noted good candidate for consolidation chemotherapy at that time. He also has a history of renal cell carcinoma status post left radical nephrectomy. The patient has been in observation for close to 4 years now. He had a recent restaging CT scan of the chest and is here to discuss the results.  The patient was seen with Dr. Julien Nordmann.  CT scan results were discussed with the patient and his wife which show some interval progression of the atelectasis compared to the prior scan.  There is no clear-cut evidence of disease progression.  Discussed with the patient that he has had a prior PET scan and a repeat biopsy both of which were negative.  We discussed continuing on with observation with a repeat CT scan in approximately 3 months.  The patient is in agreement with this plan.  He was advised to contact us if he has any increase in his shortness of breath or hemoptysis.  The patient is in agreement with this plan.  He was advised to call immediately if he has any concerning symptoms in the interval.

## 2017-08-28 ENCOUNTER — Telehealth: Payer: Self-pay | Admitting: Internal Medicine

## 2017-08-28 NOTE — Telephone Encounter (Signed)
MM PAL - moved 9/3 f/u to 9/6. Left message and mailed schedule.

## 2017-08-31 ENCOUNTER — Telehealth: Payer: Self-pay

## 2017-08-31 NOTE — Telephone Encounter (Signed)
Per 8/26 vm return. Spoke with patient about changing the time of his appointment. Informed him that MM do not work on Friday's and that this was a special occurentency  And that I Levada Dy) could not r/s him on that day. Called MM nurse and transferred patient for further assistants from [facility]

## 2017-09-01 ENCOUNTER — Telehealth: Payer: Self-pay | Admitting: Internal Medicine

## 2017-09-01 NOTE — Telephone Encounter (Signed)
Scheduled appt per 8/26 sch message- pt is aware of appt date and time

## 2017-09-03 ENCOUNTER — Ambulatory Visit (HOSPITAL_COMMUNITY)
Admission: RE | Admit: 2017-09-03 | Discharge: 2017-09-03 | Disposition: A | Discharge status: Home / Self Care | Payer: Medicare Other | Source: Ambulatory Visit | Attending: Oncology | Admitting: Oncology

## 2017-09-03 ENCOUNTER — Inpatient Hospital Stay: Payer: Medicare Other | Attending: Internal Medicine

## 2017-09-03 DIAGNOSIS — F17210 Nicotine dependence, cigarettes, uncomplicated: Secondary | ICD-10-CM | POA: Insufficient documentation

## 2017-09-03 DIAGNOSIS — Z923 Personal history of irradiation: Secondary | ICD-10-CM | POA: Insufficient documentation

## 2017-09-03 DIAGNOSIS — I700 Atherosclerosis of aorta: Secondary | ICD-10-CM | POA: Insufficient documentation

## 2017-09-03 DIAGNOSIS — Z85528 Personal history of other malignant neoplasm of kidney: Secondary | ICD-10-CM | POA: Diagnosis not present

## 2017-09-03 DIAGNOSIS — C3412 Malignant neoplasm of upper lobe, left bronchus or lung: Secondary | ICD-10-CM | POA: Diagnosis present

## 2017-09-03 DIAGNOSIS — Z905 Acquired absence of kidney: Secondary | ICD-10-CM | POA: Diagnosis not present

## 2017-09-03 DIAGNOSIS — C3492 Malignant neoplasm of unspecified part of left bronchus or lung: Secondary | ICD-10-CM

## 2017-09-03 DIAGNOSIS — Z9221 Personal history of antineoplastic chemotherapy: Secondary | ICD-10-CM | POA: Diagnosis not present

## 2017-09-03 DIAGNOSIS — I2510 Atherosclerotic heart disease of native coronary artery without angina pectoris: Secondary | ICD-10-CM | POA: Insufficient documentation

## 2017-09-03 LAB — CMP (CANCER CENTER ONLY)
ALBUMIN: 3.5 g/dL (ref 3.5–5.0)
ALK PHOS: 111 U/L (ref 38–126)
ALT: 11 U/L (ref 0–44)
AST: 14 U/L — AB (ref 15–41)
Anion gap: 8 (ref 5–15)
BUN: 11 mg/dL (ref 8–23)
CALCIUM: 9.5 mg/dL (ref 8.9–10.3)
CO2: 29 mmol/L (ref 22–32)
CREATININE: 1.29 mg/dL — AB (ref 0.61–1.24)
Chloride: 102 mmol/L (ref 98–111)
GFR, Estimated: 55 mL/min — ABNORMAL LOW (ref >60)
GLUCOSE: 93 mg/dL (ref 70–99)
Potassium: 5 mmol/L (ref 3.5–5.1)
SODIUM: 139 mmol/L (ref 135–145)
Total Bilirubin: 0.4 mg/dL (ref 0.3–1.2)
Total Protein: 7.1 g/dL (ref 6.5–8.1)

## 2017-09-03 LAB — CBC WITH DIFFERENTIAL (CANCER CENTER ONLY)
BASOS PCT: 0 %
Basophils Absolute: 0 10*3/uL (ref 0.0–0.1)
EOS ABS: 0.2 10*3/uL (ref 0.0–0.5)
EOS PCT: 2 %
HCT: 44.6 % (ref 38.4–49.9)
Hemoglobin: 14.3 g/dL (ref 13.0–17.1)
Lymphocytes Relative: 17 %
Lymphs Abs: 1.5 10*3/uL (ref 0.9–3.3)
MCH: 30 pg (ref 27.2–33.4)
MCHC: 32.1 g/dL (ref 32.0–36.0)
MCV: 93.5 fL (ref 79.3–98.0)
MONO ABS: 0.6 10*3/uL (ref 0.1–0.9)
Monocytes Relative: 6 %
Neutro Abs: 6.8 10*3/uL — ABNORMAL HIGH (ref 1.5–6.5)
Neutrophils Relative %: 75 %
Platelet Count: 298 10*3/uL (ref 140–400)
RBC: 4.77 MIL/uL (ref 4.20–5.82)
RDW: 14.1 % (ref 11.0–14.6)
WBC Count: 9.2 10*3/uL (ref 4.0–10.3)

## 2017-09-08 ENCOUNTER — Ambulatory Visit: Payer: Medicare Other | Admitting: Internal Medicine

## 2017-09-11 ENCOUNTER — Ambulatory Visit: Payer: Medicare Other | Admitting: Internal Medicine

## 2017-09-21 ENCOUNTER — Inpatient Hospital Stay: Payer: Medicare Other | Attending: Internal Medicine | Admitting: Internal Medicine

## 2017-09-21 ENCOUNTER — Encounter: Payer: Self-pay | Admitting: Internal Medicine

## 2017-09-21 ENCOUNTER — Telehealth: Payer: Self-pay | Admitting: Internal Medicine

## 2017-09-21 VITALS — BP 106/79 | HR 90 | Temp 98.1°F | Resp 17 | Ht 67.0 in | Wt 166.0 lb

## 2017-09-21 DIAGNOSIS — C642 Malignant neoplasm of left kidney, except renal pelvis: Secondary | ICD-10-CM

## 2017-09-21 DIAGNOSIS — Z85118 Personal history of other malignant neoplasm of bronchus and lung: Secondary | ICD-10-CM | POA: Insufficient documentation

## 2017-09-21 DIAGNOSIS — Z85528 Personal history of other malignant neoplasm of kidney: Secondary | ICD-10-CM | POA: Diagnosis not present

## 2017-09-21 DIAGNOSIS — F17200 Nicotine dependence, unspecified, uncomplicated: Secondary | ICD-10-CM

## 2017-09-21 DIAGNOSIS — J9383 Other pneumothorax: Secondary | ICD-10-CM | POA: Diagnosis not present

## 2017-09-21 DIAGNOSIS — Z923 Personal history of irradiation: Secondary | ICD-10-CM | POA: Diagnosis not present

## 2017-09-21 DIAGNOSIS — Z905 Acquired absence of kidney: Secondary | ICD-10-CM | POA: Insufficient documentation

## 2017-09-21 DIAGNOSIS — C3492 Malignant neoplasm of unspecified part of left bronchus or lung: Secondary | ICD-10-CM

## 2017-09-21 DIAGNOSIS — C3490 Malignant neoplasm of unspecified part of unspecified bronchus or lung: Secondary | ICD-10-CM

## 2017-09-21 DIAGNOSIS — Z9221 Personal history of antineoplastic chemotherapy: Secondary | ICD-10-CM | POA: Insufficient documentation

## 2017-09-21 NOTE — Telephone Encounter (Signed)
Appts scheduled A/calendar declined per 9/16 los

## 2017-09-21 NOTE — Progress Notes (Signed)
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 2) Status post left laparoscopic radical nephrectomy under the care of Dr. Louis Meckel on 04/04/2013.  CURRENT THERAPY: Observation.  INTERVAL HISTORY: [name] 67 y.o. male returns to the clinic today for follow-up visit accompanied by his wife.  The patient is feeling fine today with no specific complaints except for the neck pain and limitation of his movement.  He was seen by Dr. Rolena Infante from orthopedic surgery and unfortunately no intervention could be done to help him.  He is currently on pain medication prescribed by his orthopedic surgeon.  The patient denied having any chest pain, shortness of breath, cough or hemoptysis.  He denied having any fever or chills.  He has no nausea, vomiting, diarrhea or constipation.  He has no significant weight loss or night sweats.  He had repeat CT scan of the chest performed recently and he is here for evaluation and discussion of his discuss results.  MEDICAL HISTORY: Past Medical History:  Diagnosis Date  . Arthritis   . Cancer of kidney ([place])    left  . Complication of anesthesia    Very little movement of neck- "self fusioning"  . Crohn's disease ([place])    no issues since 2012  . Difficulty sleeping    occasionally  . Dyspnea    with a little exertion,  "smoked cigarettes all my life"  . History of blood  transfusion    "chron's flare"  . Lung cancer ([place])   . Renal cell carcinoma of left kidney ([place]) 02/20/2015  . Renal mass   . Renal mass, left   . Sleep apnea 05/2016  . Testicular cancer ([place]) 2000   s/p resection, no recurrence    ALLERGIES:  has No Known Allergies.  MEDICATIONS:  Current Outpatient Medications  Medication Sig Dispense Refill  . hydrOXYzine (ATARAX/VISTARIL) 50 MG tablet Take 12.5 mg by mouth at bedtime as needed (for sleep.).    Marland Kitchen Oxycodone HCl 10 MG TABS Take 10 mg by mouth 5 (five) times daily.     . Probiotic Product (PROBIOTIC DAILY PO) Take 1 capsule by mouth daily.    . Vitamin D-Vitamin K (D3 + K2 DOTS PO) Take 1 tablet by mouth at bedtime.     No current facility-administered medications for this visit.     SURGICAL HISTORY:  Past Surgical History:  Procedure Laterality Date  . APPENDECTOMY    . COLONOSCOPY    . HERNIA REPAIR     x 2  . LAPAROSCOPIC NEPHRECTOMY Left 04/04/2013   Procedure: LAPAROSCOPIC LEFT RADICAL NEPHRECTOMY ;  Surgeon: [name], MD;  Location: [facility];  Service: Urology;  Laterality: Left;  . LUNG BIOPSY Left 08/08/2016   Procedure: LEFT LUNG BIOPSY;  Surgeon: Grace Isaac, MD;  Location: [facility];  Service: Thoracic;  Laterality: Left;  . testicular removal  2000  . VIDEO BRONCHOSCOPY N/A 11/30/2012  Procedure: VIDEO BRONCHOSCOPY WITH FLUORO;  Surgeon: Elsie Stain, MD;  Location: [facility];  Service: Cardiopulmonary;  Laterality: N/A;  . VIDEO BRONCHOSCOPY N/A 08/08/2016   Procedure: VIDEO BRONCHOSCOPY;  Surgeon: Grace Isaac, MD;  Location: [facility];  Service: Thoracic;  Laterality: N/A;    REVIEW OF SYSTEMS:  A comprehensive review of systems was negative except for: Respiratory: positive for dyspnea on exertion Musculoskeletal: positive for neck pain   PHYSICAL EXAMINATION: General appearance: alert, cooperative and no distress Head: Normocephalic, without obvious abnormality, atraumatic Neck: no  adenopathy, no JVD, supple, symmetrical, trachea midline and thyroid not enlarged, symmetric, no tenderness/mass/nodules Lymph nodes: Cervical, supraclavicular, and axillary nodes normal. Resp: clear to auscultation bilaterally Back: symmetric, no curvature. ROM normal. No CVA tenderness. Cardio: regular rate and rhythm, S1, S2 normal, no murmur, click, rub or gallop GI: soft, non-tender; bowel sounds normal; no masses,  no organomegaly Extremities: extremities normal, atraumatic, no cyanosis or edema  ECOG PERFORMANCE STATUS: 1 - Symptomatic but completely ambulatory  Blood pressure 106/79, pulse 90, temperature 98.1 F (36.7 C), temperature source Oral, resp. rate 17, height 5' 7"  (1.702 m), weight 166 lb (75.3 kg), SpO2 96 %.  LABORATORY DATA: Lab Results  Component Value Date   WBC 9.2 09/03/2017   HGB 14.3 09/03/2017   HCT 44.6 09/03/2017   MCV 93.5 09/03/2017   PLT 298 09/03/2017      Chemistry      Component Value Date/Time   NA 139 09/03/2017 1449   NA 142 06/30/2016 1406   K 5.0 09/03/2017 1449   K 4.8 06/30/2016 1406   CL 102 09/03/2017 1449   CO2 29 09/03/2017 1449   CO2 29 06/30/2016 1406   BUN 11 09/03/2017 1449   BUN 14.8 06/30/2016 1406   CREATININE 1.29 (H) 09/03/2017 1449   CREATININE 1.4 (H) 06/30/2016 1406      Component Value Date/Time   CALCIUM 9.5 09/03/2017 1449   CALCIUM 9.8 06/30/2016 1406   ALKPHOS 111 09/03/2017 1449   ALKPHOS 122 06/30/2016 1406   AST 14 (L) 09/03/2017 1449   AST 11 06/30/2016 1406   ALT 11 09/03/2017 1449   ALT 13 06/30/2016 1406   BILITOT 0.4 09/03/2017 1449   BILITOT 0.40 06/30/2016 1406       RADIOGRAPHIC STUDIES: Ct Chest Wo Contrast  Result Date: 09/04/2017 CLINICAL DATA:  Left upper lobe stage III non-small cell lung cancer diagnosed 2014 status post concurrent chemoradiation therapy. Restaging. Bronchoscopic biopsy on 08/08/2016 was benign. Additional history of left nephrectomy in 2015 for renal cell  carcinoma. EXAM: CT CHEST WITHOUT CONTRAST TECHNIQUE: Multidetector CT imaging of the chest was performed following the standard protocol without IV contrast. COMPARISON:  05/29/2017 chest CT. FINDINGS: Cardiovascular: Normal heart size. Stable trace pericardial effusion/thickening. Left anterior descending and left circumflex coronary atherosclerosis. Atherosclerotic nonaneurysmal thoracic aorta. Normal caliber pulmonary arteries. Mediastinum/Nodes: No discrete thyroid nodules. Unremarkable esophagus. No axillary adenopathy. Stable coarsely calcified subcarinal and right hilar nodes from prior granulomatous disease. No pathologically enlarged mediastinal nodes. Lungs/Pleura: No pleural effusion. New small less than 5% left apical pneumothorax (series 7/image 28). No right pneumothorax. Masslike perihilar fibrosis in the left upper lung measures 5.5 x 2.4 cm (series 2/image 52), previously 6.3 x 2.5 cm using similar measurement technique, mildly decreased. Improved aeration in the left upper lobe with decreased triangular consolidation in the central left upper lobe (series 7/image 43), compatible with decreased chronic postobstructive left upper lobe atelectasis/scarring. No acute consolidative airspace disease  or new significant pulmonary nodules. Upper abdomen: Partially visualized postsurgical changes from left nephrectomy. Stable scattered punctate granulomatous splenic calcifications. Musculoskeletal: No aggressive appearing focal osseous lesions. Moderate thoracic spondylosis. IMPRESSION: 1. New small (less than 5%) left apical pneumothorax, presumably spontaneous. No contralateral mediastinal shift. 2. Stable perihilar masslike fibrosis in the upper left lung, with improved aeration in the left upper lobe. No findings suspicious for local tumor recurrence. 3. No evidence of metastatic disease in the chest. 4. Two-vessel coronary atherosclerosis. Aortic Atherosclerosis (ICD10-I70.0). Critical Value/emergent  results were called by telephone at the time of interpretation on 09/04/2017 at 2:21 pm to NP [facility] , who verbally acknowledged these results. Electronically Signed   By: Ilona Sorrel M.D.   On: 09/04/2017 14:38    ASSESSMENT AND PLAN:  This is a very pleasant 67 years old white male with history of stage IIIa non-small cell lung cancer status post concurrent chemoradiation with weekly carboplatin and paclitaxel followed by observation since the patient was noted good candidate for consolidation chemotherapy at that time. He also has a history of renal cell carcinoma status post left radical nephrectomy. The patient has been in observation since January 2015. The patient is doing fine with no concerning complaints.  He had repeat CT scan of the chest performed recently.  I personally and independently reviewed the scans and discussed the results with the patient and his wife.  His scan showed no concerning findings for disease progression but there was new small apical pneumothorax likely spontaneous and the patient is currently asymptomatic. I recommended for him to continue on observation with repeat CT scan of the chest in 6 months. I strongly encouraged the patient to call immediately if he has any concerning chest pain or shortness of breath in the interval. Disclaimer: This note was dictated with voice recognition software. Similar sounding words can inadvertently be transcribed and may not be corrected upon review.

## 2017-09-22 ENCOUNTER — Encounter: Payer: Self-pay | Admitting: *Deleted

## 2017-09-22 DIAGNOSIS — C3490 Malignant neoplasm of unspecified part of unspecified bronchus or lung: Secondary | ICD-10-CM

## 2017-11-10 ENCOUNTER — Inpatient Hospital Stay: Payer: Medicare Other | Attending: Internal Medicine

## 2017-11-10 DIAGNOSIS — C3490 Malignant neoplasm of unspecified part of unspecified bronchus or lung: Secondary | ICD-10-CM

## 2017-11-10 LAB — RESEARCH LABS

## 2017-11-24 ENCOUNTER — Encounter: Payer: Self-pay | Admitting: *Deleted

## 2017-11-24 NOTE — Progress Notes (Signed)
Called patient and left voice message to confirm if he received the $50 debit card that was mailed to him on 11/12/2017. Ask patient to return my call to confirm.

## 2017-11-26 ENCOUNTER — Telehealth: Payer: Self-pay | Admitting: *Deleted

## 2017-11-26 NOTE — Telephone Encounter (Signed)
Open by error

## 2018-03-09 ENCOUNTER — Telehealth: Payer: Self-pay | Admitting: Oncology

## 2018-03-09 NOTE — Telephone Encounter (Signed)
[name] contacted to obtain verbal, telephone consent to share their name and contact information with [employer] Scientist, product/process development and team) for purposes of soliciting patient experience feedback.  Verbal consent obtained and documented on "[place] [facility] [place] INFORMATION" form.  [name] is aware that [place] will be in contact with them at a future date for screening purposes for interviews.  Please direct questions related to this process to Sherry Ruffing via email at [name] or extension [phone].

## 2018-03-17 ENCOUNTER — Other Ambulatory Visit: Payer: Self-pay

## 2018-03-17 ENCOUNTER — Ambulatory Visit (HOSPITAL_COMMUNITY)
Admission: RE | Admit: 2018-03-17 | Discharge: 2018-03-17 | Disposition: A | Discharge status: Home / Self Care | Payer: Medicare Other | Source: Ambulatory Visit | Attending: Internal Medicine | Admitting: Internal Medicine

## 2018-03-17 ENCOUNTER — Inpatient Hospital Stay: Payer: Medicare Other | Attending: Internal Medicine

## 2018-03-17 DIAGNOSIS — Z85528 Personal history of other malignant neoplasm of kidney: Secondary | ICD-10-CM | POA: Diagnosis not present

## 2018-03-17 DIAGNOSIS — C3490 Malignant neoplasm of unspecified part of unspecified bronchus or lung: Secondary | ICD-10-CM | POA: Insufficient documentation

## 2018-03-17 DIAGNOSIS — Z85118 Personal history of other malignant neoplasm of bronchus and lung: Secondary | ICD-10-CM | POA: Insufficient documentation

## 2018-03-17 DIAGNOSIS — Z9221 Personal history of antineoplastic chemotherapy: Secondary | ICD-10-CM | POA: Diagnosis not present

## 2018-03-17 DIAGNOSIS — Z905 Acquired absence of kidney: Secondary | ICD-10-CM | POA: Diagnosis not present

## 2018-03-17 DIAGNOSIS — Z923 Personal history of irradiation: Secondary | ICD-10-CM | POA: Diagnosis not present

## 2018-03-17 DIAGNOSIS — Z8547 Personal history of malignant neoplasm of testis: Secondary | ICD-10-CM | POA: Insufficient documentation

## 2018-03-17 DIAGNOSIS — Z79899 Other long term (current) drug therapy: Secondary | ICD-10-CM | POA: Diagnosis not present

## 2018-03-17 LAB — CBC WITH DIFFERENTIAL (CANCER CENTER ONLY)
ABS IMMATURE GRANULOCYTES: 0.02 10*3/uL (ref 0.00–0.07)
BASOS ABS: 0 10*3/uL (ref 0.0–0.1)
Basophils Relative: 0 %
Eosinophils Absolute: 0.1 10*3/uL (ref 0.0–0.5)
Eosinophils Relative: 1 %
HEMATOCRIT: 44.5 % (ref 39.0–52.0)
Hemoglobin: 14 g/dL (ref 13.0–17.0)
IMMATURE GRANULOCYTES: 0 %
LYMPHS ABS: 1.4 10*3/uL (ref 0.7–4.0)
Lymphocytes Relative: 17 %
MCH: 29.5 pg (ref 26.0–34.0)
MCHC: 31.5 g/dL (ref 30.0–36.0)
MCV: 93.7 fL (ref 80.0–100.0)
Monocytes Absolute: 0.4 10*3/uL (ref 0.1–1.0)
Monocytes Relative: 5 %
NEUTROS ABS: 6.3 10*3/uL (ref 1.7–7.7)
NEUTROS PCT: 77 %
NRBC: 0 % (ref 0.0–0.2)
PLATELETS: 276 10*3/uL (ref 150–400)
RBC: 4.75 MIL/uL (ref 4.22–5.81)
RDW: 13.3 % (ref 11.5–15.5)
WBC Count: 8.2 10*3/uL (ref 4.0–10.5)

## 2018-03-17 LAB — CMP (CANCER CENTER ONLY)
ALBUMIN: 3.3 g/dL — AB (ref 3.5–5.0)
AST: 10 U/L — AB (ref 15–41)
Alkaline Phosphatase: 109 U/L (ref 38–126)
Anion gap: 9 (ref 5–15)
BUN: 15 mg/dL (ref 8–23)
CHLORIDE: 104 mmol/L (ref 98–111)
CO2: 26 mmol/L (ref 22–32)
CREATININE: 1.23 mg/dL (ref 0.61–1.24)
Calcium: 9.1 mg/dL (ref 8.9–10.3)
GFR, EST NON AFRICAN AMERICAN: 60 mL/min — AB (ref >60)
GFR, Est AFR Am: >60 mL/min (ref >60)
GLUCOSE: 88 mg/dL (ref 70–99)
POTASSIUM: 4.6 mmol/L (ref 3.5–5.1)
Sodium: 139 mmol/L (ref 135–145)
Total Bilirubin: 0.4 mg/dL (ref 0.3–1.2)
Total Protein: 6.8 g/dL (ref 6.5–8.1)

## 2018-03-22 ENCOUNTER — Encounter: Payer: Self-pay | Admitting: Internal Medicine

## 2018-03-22 ENCOUNTER — Telehealth: Payer: Self-pay | Admitting: Internal Medicine

## 2018-03-22 ENCOUNTER — Other Ambulatory Visit: Payer: Self-pay

## 2018-03-22 ENCOUNTER — Inpatient Hospital Stay: Payer: Medicare Other | Admitting: Internal Medicine

## 2018-03-22 VITALS — BP 118/77 | HR 84 | Temp 98.0°F | Resp 18 | Wt 162.5 lb

## 2018-03-22 DIAGNOSIS — Z905 Acquired absence of kidney: Secondary | ICD-10-CM | POA: Diagnosis not present

## 2018-03-22 DIAGNOSIS — Z85118 Personal history of other malignant neoplasm of bronchus and lung: Secondary | ICD-10-CM | POA: Diagnosis not present

## 2018-03-22 DIAGNOSIS — Z9221 Personal history of antineoplastic chemotherapy: Secondary | ICD-10-CM

## 2018-03-22 DIAGNOSIS — Z85528 Personal history of other malignant neoplasm of kidney: Secondary | ICD-10-CM

## 2018-03-22 DIAGNOSIS — Z79899 Other long term (current) drug therapy: Secondary | ICD-10-CM

## 2018-03-22 DIAGNOSIS — C3492 Malignant neoplasm of unspecified part of left bronchus or lung: Secondary | ICD-10-CM

## 2018-03-22 DIAGNOSIS — Z923 Personal history of irradiation: Secondary | ICD-10-CM

## 2018-03-22 DIAGNOSIS — C642 Malignant neoplasm of left kidney, except renal pelvis: Secondary | ICD-10-CM

## 2018-03-22 DIAGNOSIS — Z8547 Personal history of malignant neoplasm of testis: Secondary | ICD-10-CM

## 2018-03-22 DIAGNOSIS — C3490 Malignant neoplasm of unspecified part of unspecified bronchus or lung: Secondary | ICD-10-CM

## 2018-03-22 NOTE — Progress Notes (Signed)
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 2) Status post left laparoscopic radical nephrectomy under the care of Dr. Louis Meckel on 04/04/2013.  CURRENT THERAPY: Observation.  INTERVAL HISTORY: [name] 68 y.o. male returns to the clinic today for follow-up visit.  The patient is feeling fine today with no concerning complaints.  He denied having any chest pain, shortness of breath, cough or hemoptysis.  He denied having any fever or chills.  He has no nausea, vomiting, diarrhea or constipation.  He has no headache or visual changes.  He had repeat CT scan of the chest performed recently and he is here for evaluation and discussion of his scan results.  MEDICAL HISTORY: Past Medical History:  Diagnosis Date  . Arthritis   . Cancer of kidney ([place])    left  . Complication of anesthesia    Very little movement of neck- "self fusioning"  . Crohn's disease ([place])    no issues since 2012  . Difficulty sleeping    occasionally  . Dyspnea    with a little exertion,  "smoked cigarettes all my life"  . History of blood transfusion    "chron's flare"  . Lung cancer ([place])   . Renal cell carcinoma of left kidney ([place]) 02/20/2015  . Renal mass   . Renal mass, left   . Sleep apnea 05/2016  . Testicular cancer ([place]) 2000   s/p resection, no recurrence    ALLERGIES:  has No Known Allergies.  MEDICATIONS:   Current Outpatient Medications  Medication Sig Dispense Refill  . hydrOXYzine (ATARAX/VISTARIL) 50 MG tablet Take 12.5 mg by mouth at bedtime as needed (for sleep.).    Marland Kitchen Oxycodone HCl 10 MG TABS Take 10 mg by mouth 5 (five) times daily.     . Probiotic Product (PROBIOTIC DAILY PO) Take 1 capsule by mouth daily.    . Vitamin D-Vitamin K (D3 + K2 DOTS PO) Take 1 tablet by mouth at bedtime.     No current facility-administered medications for this visit.     SURGICAL HISTORY:  Past Surgical History:  Procedure Laterality Date  . APPENDECTOMY    . COLONOSCOPY    . HERNIA REPAIR     x 2  . LAPAROSCOPIC NEPHRECTOMY Left 04/04/2013   Procedure: LAPAROSCOPIC LEFT RADICAL NEPHRECTOMY ;  Surgeon: [name], MD;  Location: [facility];  Service: Urology;  Laterality: Left;  . LUNG BIOPSY Left 08/08/2016   Procedure: LEFT LUNG BIOPSY;  Surgeon: Grace Isaac, MD;  Location: [facility];  Service: Thoracic;  Laterality: Left;  . testicular removal  2000  . VIDEO BRONCHOSCOPY N/A 11/30/2012   Procedure: VIDEO BRONCHOSCOPY WITH FLUORO;  Surgeon: Elsie Stain, MD;  Location: [facility];  Service: Cardiopulmonary;  Laterality: N/A;  . VIDEO BRONCHOSCOPY N/A 08/08/2016   Procedure: VIDEO BRONCHOSCOPY;  Surgeon: Grace Isaac, MD;  Location: [facility];  Service: Thoracic;  Laterality: N/A;  REVIEW OF SYSTEMS:  A comprehensive review of systems was negative except for: Respiratory: positive for dyspnea on exertion   PHYSICAL EXAMINATION: General appearance: alert, cooperative and no distress Head: Normocephalic, without obvious abnormality, atraumatic Neck: no adenopathy, no JVD, supple, symmetrical, trachea midline and thyroid not enlarged, symmetric, no tenderness/mass/nodules Lymph nodes: Cervical, supraclavicular, and axillary nodes normal. Resp: clear to auscultation bilaterally Back: symmetric, no curvature. ROM normal. No CVA tenderness. Cardio: regular rate and rhythm, S1, S2 normal,  no murmur, click, rub or gallop GI: soft, non-tender; bowel sounds normal; no masses,  no organomegaly Extremities: extremities normal, atraumatic, no cyanosis or edema  ECOG PERFORMANCE STATUS: 1 - Symptomatic but completely ambulatory  Blood pressure 118/77, pulse 84, temperature 98 F (36.7 C), temperature source Oral, resp. rate 18, weight 162 lb 8 oz (73.7 kg), SpO2 97 %.  LABORATORY DATA: Lab Results  Component Value Date   WBC 8.2 03/17/2018   HGB 14.0 03/17/2018   HCT 44.5 03/17/2018   MCV 93.7 03/17/2018   PLT 276 03/17/2018      Chemistry      Component Value Date/Time   NA 139 03/17/2018 1459   NA 142 06/30/2016 1406   K 4.6 03/17/2018 1459   K 4.8 06/30/2016 1406   CL 104 03/17/2018 1459   CO2 26 03/17/2018 1459   CO2 29 06/30/2016 1406   BUN 15 03/17/2018 1459   BUN 14.8 06/30/2016 1406   CREATININE 1.23 03/17/2018 1459   CREATININE 1.4 (H) 06/30/2016 1406      Component Value Date/Time   CALCIUM 9.1 03/17/2018 1459   CALCIUM 9.8 06/30/2016 1406   ALKPHOS 109 03/17/2018 1459   ALKPHOS 122 06/30/2016 1406   AST 10 (L) 03/17/2018 1459   AST 11 06/30/2016 1406   ALT <6 03/17/2018 1459   ALT 13 06/30/2016 1406   BILITOT 0.4 03/17/2018 1459   BILITOT 0.40 06/30/2016 1406       RADIOGRAPHIC STUDIES: Ct Chest Wo Contrast  Result Date: 03/17/2018 CLINICAL DATA:  Left lung cancer, chemotherapy and XRT complete. History of left renal cancer status post left nephrectomy. History of left testicular cancer, status post orchiectomy. EXAM: CT CHEST WITHOUT CONTRAST TECHNIQUE: Multidetector CT imaging of the chest was performed following the standard protocol without IV contrast. COMPARISON:  09/03/2017 FINDINGS: Cardiovascular: Heart is normal in size. Trace anterior pericardial fluid. No evidence thoracic aortic aneurysm. Atherosclerotic calcifications of the aortic arch. Coronary atherosclerosis of the LAD. Mediastinum/Nodes: Calcified subcarinal and right  perihilar nodes. No suspicious mediastinal lymphadenopathy. Visualized thyroid is unremarkable. Lungs/Pleura: Masslike fibrosis in the left upper lobe/perihilar region, unchanged. Associated left upper lobe volume loss. No suspicious pulmonary nodules. Mild centrilobular emphysematous changes, upper lobe predominant. No focal consolidation. No pleural effusion or pneumothorax. Upper Abdomen: Visualized upper abdomen is notable for vascular calcifications. Musculoskeletal: Mild degenerative changes of the visualized thoracolumbar spine. IMPRESSION: Radiation changes in the left upper lobe/perihilar region, unchanged. No evidence of recurrent or metastatic disease. Aortic Atherosclerosis (ICD10-I70.0) and Emphysema (ICD10-J43.9). Electronically Signed   By: Julian Hy M.D.   On: 03/17/2018 20:14    ASSESSMENT AND PLAN:  This is a very pleasant 68 years old white male with history of stage IIIa non-small cell lung cancer status post concurrent chemoradiation with weekly carboplatin and paclitaxel followed by observation since the patient was noted good candidate for consolidation chemotherapy at that time. He also has a history of renal cell carcinoma status post left radical nephrectomy. The patient has been in  observation since January 2015. The patient is feeling fine with no concerning complaints today. Repeat CT scan of the chest showed no concerning findings for disease recurrence or progression. I discussed the scan results with the patient and recommended for him to continue on observation with repeat CT scan of the chest in 6 months. The patient was advised to call immediately if he has any concerning symptoms in the interval. I strongly encouraged the patient to call immediately if he has any concerning chest pain or shortness of breath in the interval. Disclaimer: This note was dictated with voice recognition software. Similar sounding words can inadvertently be transcribed and may not be  corrected upon review.

## 2018-03-22 NOTE — Telephone Encounter (Signed)
Scheduled appt per 3/16 los.  Gave patient the number to central radiology.   Patient declined avs and calendar.

## 2018-09-20 ENCOUNTER — Other Ambulatory Visit: Payer: Self-pay

## 2018-09-20 ENCOUNTER — Ambulatory Visit (HOSPITAL_COMMUNITY)
Admission: RE | Admit: 2018-09-20 | Discharge: 2018-09-20 | Disposition: A | Discharge status: Home / Self Care | Payer: Medicare Other | Source: Ambulatory Visit | Attending: Internal Medicine | Admitting: Internal Medicine

## 2018-09-20 ENCOUNTER — Inpatient Hospital Stay: Payer: Medicare Other | Attending: Internal Medicine

## 2018-09-20 DIAGNOSIS — Z8547 Personal history of malignant neoplasm of testis: Secondary | ICD-10-CM | POA: Insufficient documentation

## 2018-09-20 DIAGNOSIS — C3412 Malignant neoplasm of upper lobe, left bronchus or lung: Secondary | ICD-10-CM | POA: Diagnosis present

## 2018-09-20 DIAGNOSIS — C3490 Malignant neoplasm of unspecified part of unspecified bronchus or lung: Secondary | ICD-10-CM

## 2018-09-20 DIAGNOSIS — K5090 Crohn's disease, unspecified, without complications: Secondary | ICD-10-CM | POA: Diagnosis not present

## 2018-09-20 DIAGNOSIS — Z905 Acquired absence of kidney: Secondary | ICD-10-CM | POA: Diagnosis not present

## 2018-09-20 DIAGNOSIS — Z85528 Personal history of other malignant neoplasm of kidney: Secondary | ICD-10-CM | POA: Insufficient documentation

## 2018-09-20 LAB — CBC WITH DIFFERENTIAL (CANCER CENTER ONLY)
Abs Immature Granulocytes: 0.03 10*3/uL (ref 0.00–0.07)
Basophils Absolute: 0 10*3/uL (ref 0.0–0.1)
Basophils Relative: 1 %
Eosinophils Absolute: 0.2 10*3/uL (ref 0.0–0.5)
Eosinophils Relative: 2 %
HCT: 45.6 % (ref 39.0–52.0)
Hemoglobin: 14.3 g/dL (ref 13.0–17.0)
Immature Granulocytes: 0 %
Lymphocytes Relative: 17 %
Lymphs Abs: 1.3 10*3/uL (ref 0.7–4.0)
MCH: 29.4 pg (ref 26.0–34.0)
MCHC: 31.4 g/dL (ref 30.0–36.0)
MCV: 93.6 fL (ref 80.0–100.0)
Monocytes Absolute: 0.5 10*3/uL (ref 0.1–1.0)
Monocytes Relative: 6 %
Neutro Abs: 5.5 10*3/uL (ref 1.7–7.7)
Neutrophils Relative %: 74 %
Platelet Count: 301 10*3/uL (ref 150–400)
RBC: 4.87 MIL/uL (ref 4.22–5.81)
RDW: 13.3 % (ref 11.5–15.5)
WBC Count: 7.6 10*3/uL (ref 4.0–10.5)
nRBC: 0 % (ref 0.0–0.2)

## 2018-09-20 LAB — CMP (CANCER CENTER ONLY)
ALT: 12 U/L (ref 0–44)
AST: 14 U/L — ABNORMAL LOW (ref 15–41)
Albumin: 3.4 g/dL — ABNORMAL LOW (ref 3.5–5.0)
Alkaline Phosphatase: 118 U/L (ref 38–126)
Anion gap: 7 (ref 5–15)
BUN: 12 mg/dL (ref 8–23)
CO2: 30 mmol/L (ref 22–32)
Calcium: 9.2 mg/dL (ref 8.9–10.3)
Chloride: 101 mmol/L (ref 98–111)
Creatinine: 1.41 mg/dL — ABNORMAL HIGH (ref 0.61–1.24)
GFR, Est AFR Am: 58 mL/min — ABNORMAL LOW (ref >60)
GFR, Estimated: 50 mL/min — ABNORMAL LOW (ref >60)
Glucose, Bld: 103 mg/dL — ABNORMAL HIGH (ref 70–99)
Potassium: 5.1 mmol/L (ref 3.5–5.1)
Sodium: 138 mmol/L (ref 135–145)
Total Bilirubin: 0.4 mg/dL (ref 0.3–1.2)
Total Protein: 6.8 g/dL (ref 6.5–8.1)

## 2018-09-20 MED ORDER — IOHEXOL 300 MG/ML  SOLN
75.0000 mL | Freq: Once | INTRAMUSCULAR | Status: AC | PRN
  Administered 2018-09-20: 75 mL via INTRAVENOUS

## 2018-09-20 MED ORDER — SODIUM CHLORIDE (PF) 0.9 % IJ SOLN
INTRAMUSCULAR | Status: AC
  Filled 2018-09-20: qty 50

## 2018-09-21 ENCOUNTER — Telehealth: Payer: Self-pay | Admitting: Medical Oncology

## 2018-09-21 ENCOUNTER — Inpatient Hospital Stay (HOSPITAL_BASED_OUTPATIENT_CLINIC_OR_DEPARTMENT_OTHER): Payer: Medicare Other | Admitting: Internal Medicine

## 2018-09-21 ENCOUNTER — Other Ambulatory Visit: Payer: Self-pay

## 2018-09-21 ENCOUNTER — Encounter: Payer: Self-pay | Admitting: Internal Medicine

## 2018-09-21 VITALS — BP 134/75 | HR 79 | Temp 98.3°F | Resp 20 | Ht 67.0 in | Wt 168.6 lb

## 2018-09-21 DIAGNOSIS — C642 Malignant neoplasm of left kidney, except renal pelvis: Secondary | ICD-10-CM | POA: Diagnosis not present

## 2018-09-21 DIAGNOSIS — C3490 Malignant neoplasm of unspecified part of unspecified bronchus or lung: Secondary | ICD-10-CM | POA: Diagnosis not present

## 2018-09-21 DIAGNOSIS — C3492 Malignant neoplasm of unspecified part of left bronchus or lung: Secondary | ICD-10-CM | POA: Diagnosis not present

## 2018-09-21 DIAGNOSIS — C3412 Malignant neoplasm of upper lobe, left bronchus or lung: Secondary | ICD-10-CM | POA: Diagnosis not present

## 2018-09-21 NOTE — Telephone Encounter (Signed)
Ct report received.

## 2018-09-21 NOTE — Progress Notes (Signed)
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 2) Status post left laparoscopic radical nephrectomy under the care of Dr. Louis Thomas on 04/04/2013.  CURRENT THERAPY: Observation.  INTERVAL HISTORY: [name] 68 y.o. male returns to the clinic today for follow-up visit.  The patient is feeling fine today with no concerning complaints except for the baseline shortness of breath increased with exertion.  He denied having any chest pain, cough or hemoptysis.  He denied having any recent weight loss or night sweats.  He has no nausea, vomiting, diarrhea or constipation.  He has no headache or visual changes.  He had repeat CT scan of the chest performed recently and he is here for evaluation and discussion of his scan results.  MEDICAL HISTORY: Past Medical History:  Diagnosis Date  . Arthritis   . Cancer of kidney ([place])    left  . Complication of anesthesia    Very little movement of neck- "self fusioning"  . Crohn's disease ([place])    no issues since 2012  . Difficulty sleeping    occasionally  . Dyspnea    with a little exertion,  "smoked cigarettes all my life"  . History of blood transfusion    "chron's flare"  . Lung cancer ([name])   . Renal cell carcinoma of left kidney ([name]) 02/20/2015  . Renal mass   . Renal mass, left   . Sleep apnea 05/2016  . Testicular cancer ([place]) 2000   s/p resection, no  recurrence    ALLERGIES:  has No Known Allergies.  MEDICATIONS:  Current Outpatient Medications  Medication Sig Dispense Refill  . hydrOXYzine (ATARAX/VISTARIL) 50 MG tablet Take 12.5 mg by mouth at bedtime as needed (for sleep.).    Marland Kitchen Oxycodone HCl 10 MG TABS Take 10 mg by mouth 5 (five) times daily.     . Probiotic Product (PROBIOTIC DAILY PO) Take 1 capsule by mouth daily.    . Vitamin D-Vitamin K (D3 + K2 DOTS PO) Take 1 tablet by mouth at bedtime.     No current facility-administered medications for this visit.     SURGICAL HISTORY:  Past Surgical History:  Procedure Laterality Date  . APPENDECTOMY    . COLONOSCOPY    . HERNIA REPAIR     x 2  . LAPAROSCOPIC NEPHRECTOMY Left 04/04/2013   Procedure: LAPAROSCOPIC LEFT RADICAL NEPHRECTOMY ;  Surgeon: Reginald Hughs, MD;  Location: [facility];  Service: Urology;  Laterality: Left;  . LUNG BIOPSY Left 08/08/2016   Procedure: LEFT LUNG BIOPSY;  Surgeon: Reginald Isaac, MD;  Location: [facility];  Service: Thoracic;  Laterality: Left;  . testicular removal  2000  . VIDEO BRONCHOSCOPY N/A 11/30/2012   Procedure: VIDEO BRONCHOSCOPY WITH FLUORO;  Surgeon: Elsie Stain, MD;  [facility];  Service: Cardiopulmonary;  Laterality: N/A;  . VIDEO BRONCHOSCOPY N/A 08/08/2016   Procedure: VIDEO BRONCHOSCOPY;  Surgeon: Reginald Isaac, MD;  Location[facility];  Service: Thoracic;  Laterality: N/A;    REVIEW OF SYSTEMS:  A comprehensive review of systems was negative except for: Respiratory: positive for dyspnea on exertion   PHYSICAL EXAMINATION: General appearance: alert, cooperative and no distress Head: Normocephalic, without obvious abnormality, atraumatic Neck: no adenopathy, no JVD, supple, symmetrical, trachea midline and thyroid not enlarged, symmetric, no tenderness/mass/nodules Lymph nodes: Cervical, supraclavicular, and axillary nodes normal. Resp: clear to auscultation bilaterally Back: symmetric, no curvature. ROM  normal. No CVA tenderness. Cardio: regular rate and rhythm, S1, S2 normal, no murmur, click, rub or gallop GI: soft, non-tender; bowel sounds normal; no masses,  no organomegaly Extremities: extremities normal, atraumatic, no cyanosis or edema  ECOG PERFORMANCE STATUS: 1 - Symptomatic but completely ambulatory  Blood pressure 134/75, pulse 79, temperature 98.3 F (36.8 C), resp. rate 20, height 5' 7"  (1.702 m), weight 168 lb 9.6 oz (76.5 kg), SpO2 98 %.  LABORATORY DATA: Lab Results  Component Value Date   WBC 7.6 09/20/2018   HGB 14.3 09/20/2018   HCT 45.6 09/20/2018   MCV 93.6 09/20/2018   PLT 301 09/20/2018      Chemistry      Component Value Date/Time   NA 138 09/20/2018 1510   NA 142 06/30/2016 1406   K 5.1 09/20/2018 1510   K 4.8 06/30/2016 1406   CL 101 09/20/2018 1510   CO2 30 09/20/2018 1510   CO2 29 06/30/2016 1406   BUN 12 09/20/2018 1510   BUN 14.8 06/30/2016 1406   CREATININE 1.41 (H) 09/20/2018 1510   CREATININE 1.4 (H) 06/30/2016 1406      Component Value Date/Time   CALCIUM 9.2 09/20/2018 1510   CALCIUM 9.8 06/30/2016 1406   ALKPHOS 118 09/20/2018 1510   ALKPHOS 122 06/30/2016 1406   AST 14 (L) 09/20/2018 1510   AST 11 06/30/2016 1406   ALT 12 09/20/2018 1510   ALT 13 06/30/2016 1406   BILITOT 0.4 09/20/2018 1510   BILITOT 0.40 06/30/2016 1406       RADIOGRAPHIC STUDIES: Ct Chest W Contrast  Result Date: 09/20/2018 CLINICAL DATA:  Stage IIIA squamous cell left upper lobe lung cancer diagnosed 2014 status post concurrent chemo radiation therapy, presenting for restaging with interval observation. Stage I clear cell renal cell carcinoma diagnosed 2014 status post left nephrectomy. EXAM: CT CHEST WITH CONTRAST TECHNIQUE: Multidetector CT imaging of the chest was performed during intravenous contrast administration. CONTRAST:  27m OMNIPAQUE IOHEXOL 300 MG/ML  SOLN COMPARISON:  03/17/2018 chest CT. FINDINGS: Cardiovascular: Normal heart size. No  significant pericardial effusion/thickening. Left anterior descending coronary atherosclerosis. Atherosclerotic nonaneurysmal thoracic aorta. Normal caliber pulmonary arteries. No central pulmonary emboli. Mediastinum/Nodes: No discrete thyroid nodules. Unremarkable esophagus. No pathologically enlarged axillary, mediastinal or hilar lymph nodes. Stable coarsely calcified nonenlarged subcarinal and right hilar nodes from prior granulomatous disease. Lungs/Pleura: No pneumothorax. No pleural effusion. No acute consolidative airspace disease. Masslike fibrosis in central left upper lobe measures 5.3 x 3.4 cm (series 5/image 53) previously 5.3 x 3.4 cm on 03/17/2018 chest CT using similar measurement technique, stable. No significant pulmonary nodules. Stable calcified granulomas in the central right lower lobe. Upper abdomen: Status post left nephrectomy. Enhancing 1.5 x 1.3 cm posterior left adrenal nodule (series 2/image 144) appears new. Musculoskeletal: No aggressive appearing focal osseous lesions. Marked thoracic spondylosis. IMPRESSION: 1. Enhancing 1.5 cm left adrenal nodule appears new, worrisome for left adrenal metastasis. Suggest PET-CT for further evaluation. 2. Stable masslike fibrosis in the central left upper lung  lobe, with no findings to suggest local lung tumor recurrence. 3. No findings of metastatic disease in the chest. Aortic Atherosclerosis (ICD10-I70.0). These results will be called to the ordering clinician or representative by the Radiologist Assistant, and communication documented in the PACS or zVision Dashboard. Electronically Signed   By: Ilona Sorrel M.D.   On: 09/20/2018 20:18    ASSESSMENT AND PLAN:  This is a very pleasant 68 years old white male with history of stage IIIa non-small cell lung cancer status post concurrent chemoradiation with weekly carboplatin and paclitaxel followed by observation since the patient was noted good candidate for consolidation chemotherapy at that  time. He also has a history of renal cell carcinoma status post left radical nephrectomy. The patient has been in observation since January 2015. The patient has no complaints today. He had repeat CT scan of the chest performed recently.  I personally and independently reviewed the scans and discussed the results with the patient today. His scan showed no concerning findings for disease progression in the chest but there was new enhancing 1.5 cm left adrenal nodule worrisome for left adrenal metastasis. I recommended for the patient to consider a repeat PET scan for further evaluation of this lesion and to rule out any other metastatic disease. I will see him back for follow-up visit in 2 weeks for evaluation and discussion of his scan results as well as recommendation regarding his condition. He was advised to call immediately if he has any concerning symptoms in the interval. I strongly encouraged the patient to call immediately if he has any concerning chest pain or shortness of breath in the interval. Disclaimer: This note was dictated with voice recognition software. Similar sounding words can inadvertently be transcribed and may not be corrected upon review.

## 2018-09-23 ENCOUNTER — Telehealth: Payer: Self-pay | Admitting: Internal Medicine

## 2018-09-23 NOTE — Telephone Encounter (Signed)
Scheduled appt per 9/15 los - pt wife aware of appt date and time

## 2018-10-05 ENCOUNTER — Encounter (HOSPITAL_COMMUNITY)
Admission: RE | Admit: 2018-10-05 | Discharge: 2018-10-05 | Disposition: A | Discharge status: Home / Self Care | Payer: Medicare Other | Source: Ambulatory Visit | Attending: Internal Medicine | Admitting: Internal Medicine

## 2018-10-05 ENCOUNTER — Other Ambulatory Visit: Payer: Self-pay

## 2018-10-05 DIAGNOSIS — C3490 Malignant neoplasm of unspecified part of unspecified bronchus or lung: Secondary | ICD-10-CM | POA: Diagnosis not present

## 2018-10-05 DIAGNOSIS — G4730 Sleep apnea, unspecified: Secondary | ICD-10-CM | POA: Insufficient documentation

## 2018-10-05 DIAGNOSIS — R911 Solitary pulmonary nodule: Secondary | ICD-10-CM | POA: Insufficient documentation

## 2018-10-05 DIAGNOSIS — Z79899 Other long term (current) drug therapy: Secondary | ICD-10-CM | POA: Insufficient documentation

## 2018-10-05 LAB — GLUCOSE, CAPILLARY: Glucose-Capillary: 102 mg/dL — ABNORMAL HIGH (ref 70–99)

## 2018-10-05 MED ORDER — FLUDEOXYGLUCOSE F - 18 (FDG) INJECTION: 8.7000 | Freq: Once | INTRAVENOUS | Status: DC | PRN

## 2018-10-06 ENCOUNTER — Other Ambulatory Visit: Payer: Self-pay

## 2018-10-06 ENCOUNTER — Inpatient Hospital Stay (HOSPITAL_BASED_OUTPATIENT_CLINIC_OR_DEPARTMENT_OTHER): Payer: Medicare Other | Admitting: Physician Assistant

## 2018-10-06 ENCOUNTER — Encounter: Payer: Self-pay | Admitting: Physician Assistant

## 2018-10-06 VITALS — BP 123/80 | HR 95 | Temp 98.3°F | Resp 16 | Ht 67.0 in | Wt 168.1 lb

## 2018-10-06 DIAGNOSIS — F17200 Nicotine dependence, unspecified, uncomplicated: Secondary | ICD-10-CM | POA: Diagnosis not present

## 2018-10-06 DIAGNOSIS — C3412 Malignant neoplasm of upper lobe, left bronchus or lung: Secondary | ICD-10-CM | POA: Diagnosis not present

## 2018-10-06 DIAGNOSIS — C3492 Malignant neoplasm of unspecified part of left bronchus or lung: Secondary | ICD-10-CM | POA: Diagnosis not present

## 2018-10-06 DIAGNOSIS — C642 Malignant neoplasm of left kidney, except renal pelvis: Secondary | ICD-10-CM | POA: Diagnosis not present

## 2018-10-06 NOTE — Progress Notes (Signed)
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 2) Status post left laparoscopic radical nephrectomy under the care of Dr. Louis Meckel on 04/04/2013.  CURRENT THERAPY: Observation  INTERVAL HISTORY: [name] 68 y.o. male returns to the clinic for a follow-up visit.  The patient is feeling well today without any concerning complaints.  The patient denies any fever, chills, night sweats, or weight loss.  He denies any chest pain or hemoptysis. He reports his baseline shortness of breath with exertion and occasional cough. He continues to smoke approximately 1 pack of cigarettes a day.  Denies any headache or visual changes.  He denies any nausea, vomiting, diarrhea, constipation.  The patient recently had a restaging CT scan performed which showed no evidence of disease progression in the chest but there is a new 1.5 cm enhancing nodule in the left adrenal gland which was reported to be worrisome for left adrenal metastasis.  The patient had a PET scan performed to further characterize this lesion.  The patient is here today for evaluation and to review his scan and recommendations.  MEDICAL HISTORY: Past Medical History:  Diagnosis Date  . Arthritis   . Cancer of kidney ([place])    left  . Complication of anesthesia    Very little movement of neck- "self fusioning"  . Crohn's disease ([facility])    no issues since 2012  . Difficulty sleeping    occasionally  . Dyspnea     with a little exertion,  "smoked cigarettes all my life"  . History of blood transfusion    "chron's flare"  . Lung cancer ([place])   . Renal cell carcinoma of left kidney ([facility]) 02/20/2015  . Renal mass   . Renal mass, left   . Sleep apnea 05/2016  . Testicular cancer ([place]) 2000   s/p resection, no recurrence    ALLERGIES:  has No Known Allergies.  MEDICATIONS:  Current Outpatient Medications  Medication Sig Dispense Refill  . hydrOXYzine (ATARAX/VISTARIL) 50 MG tablet Take 12.5 mg by mouth at bedtime as needed (for sleep.).    Marland Kitchen Oxycodone HCl 10 MG TABS Take 10 mg by mouth 5 (five) times daily.     . Probiotic Product (PROBIOTIC DAILY PO) Take 1 capsule by mouth daily.    . Vitamin D-Vitamin K (D3 + K2 DOTS PO) Take 1 tablet by mouth at bedtime.     No current facility-administered medications for this visit.    Facility-Administered Medications Ordered in Other Visits  Medication Dose Route Frequency Provider Last Rate Last Dose  . fludeoxyglucose F - 18 (FDG) injection 8.7 millicurie  8.7 millicurie Intravenous Once PRN Felipa Emory, MD        SURGICAL HISTORY:  Past Surgical History:  Procedure Laterality Date  . APPENDECTOMY    . COLONOSCOPY    . HERNIA REPAIR     x 2  . LAPAROSCOPIC NEPHRECTOMY Left 04/04/2013   Procedure: LAPAROSCOPIC LEFT RADICAL NEPHRECTOMY ;  Surgeon: [name], MD;  Location: [facility];  Service: Urology;  Laterality: Left;  . LUNG BIOPSY Left 08/08/2016   Procedure: LEFT LUNG BIOPSY;  Surgeon: Grace Isaac, MD;  Location: [facility];  Service: Thoracic;  Laterality: Left;  . testicular removal  2000  . VIDEO BRONCHOSCOPY N/A 11/30/2012   Procedure: VIDEO BRONCHOSCOPY WITH FLUORO;  Surgeon: Elsie Stain, MD;  Location: [facility];  Service: Cardiopulmonary;  Laterality: N/A;  . VIDEO BRONCHOSCOPY N/A 08/08/2016   Procedure: VIDEO BRONCHOSCOPY;  Surgeon: Grace Isaac, MD;  Location: [facility] OR;  Service: Thoracic;  Laterality: N/A;     REVIEW OF SYSTEMS:   Review of Systems  Constitutional: Negative for appetite change, chills, fatigue, fever and unexpected weight change.  HENT: Negative for mouth sores, nosebleeds, sore throat and trouble swallowing.   Eyes: Negative for eye problems and icterus.  Respiratory: Positive for baseline shortness of breath and cough. Negative for hemoptysis and wheezing.   Cardiovascular: Negative for chest pain and leg swelling.  Gastrointestinal: Negative for abdominal pain, constipation, diarrhea, nausea and vomiting.  Genitourinary: Negative for bladder incontinence, difficulty urinating, dysuria, frequency and hematuria.   Musculoskeletal: Negative for back pain, gait problem, neck pain and neck stiffness.  Skin: Negative for itching and rash.  Neurological: Negative for dizziness, extremity weakness, gait problem, headaches, light-headedness and seizures.  Hematological: Negative for adenopathy. Does not bruise/bleed easily.  Psychiatric/Behavioral: Negative for confusion, depression and sleep disturbance. The patient is not nervous/anxious.     PHYSICAL EXAMINATION:  Blood pressure 123/80, pulse 95, temperature 98.3 F (36.8 C), temperature source Temporal, resp. rate 16, height 5' 7"  (1.702 m), weight 168 lb 1.6 oz (76.2 kg), SpO2 98 %.  ECOG PERFORMANCE STATUS: 0 - Asymptomatic  Physical Exam  Constitutional: Oriented to person, place, and time and well-developed, well-nourished, and in no distress.   HENT:  Head: Normocephalic and atraumatic.  Mouth/Throat: Oropharynx is clear and moist. No oropharyngeal exudate.  Eyes: Conjunctivae are normal. Right eye exhibits no discharge. Left eye exhibits no discharge. No scleral icterus.  Neck: Normal range of motion. Neck supple.  Cardiovascular: Normal rate, regular rhythm, normal heart sounds and intact distal pulses.   Pulmonary/Chest: Effort normal and breath sounds normal. No respiratory distress. No wheezes. No rales.   Abdominal: Soft. Bowel sounds are normal. Exhibits no distension and no mass. There is no tenderness.  Musculoskeletal: Normal range of motion. Exhibits no edema.  Lymphadenopathy:    No cervical adenopathy.  Neurological: Alert and oriented to person, place, and time. Exhibits normal muscle tone. Gait normal. Coordination normal.  Skin: Skin is warm and dry. No rash noted. Not diaphoretic. No erythema. No pallor.  Psychiatric: Mood, memory and judgment normal.  Vitals reviewed.  LABORATORY DATA: Lab Results  Component Value Date   WBC 7.6 09/20/2018   HGB 14.3 09/20/2018   HCT 45.6 09/20/2018   MCV 93.6 09/20/2018   PLT 301 09/20/2018      Chemistry      Component Value Date/Time   NA 138 09/20/2018 1510   NA 142 06/30/2016 1406   K 5.1 09/20/2018 1510   K 4.8 06/30/2016 1406   CL 101 09/20/2018 1510   CO2 30 09/20/2018 1510   CO2 29 06/30/2016 1406   BUN 12 09/20/2018 1510   BUN 14.8 06/30/2016 1406   CREATININE 1.41 (H) 09/20/2018 1510   CREATININE 1.4 (H) 06/30/2016 1406      Component Value Date/Time   CALCIUM 9.2 09/20/2018 1510   CALCIUM 9.8 06/30/2016  1406   ALKPHOS 118 09/20/2018 1510   ALKPHOS 122 06/30/2016 1406   AST 14 (L) 09/20/2018 1510   AST 11 06/30/2016 1406   ALT 12 09/20/2018 1510   ALT 13 06/30/2016 1406   BILITOT 0.4 09/20/2018 1510   BILITOT 0.40 06/30/2016 1406       RADIOGRAPHIC STUDIES:  Ct Chest W Contrast  Result Date: 09/20/2018 CLINICAL DATA:  Stage IIIA squamous cell left upper lobe lung cancer diagnosed 2014 status post concurrent chemo radiation therapy, presenting for restaging with interval observation. Stage I clear cell renal cell carcinoma diagnosed 2014 status post left nephrectomy. EXAM: CT CHEST WITH CONTRAST TECHNIQUE: Multidetector CT imaging of the chest was performed during intravenous contrast administration. CONTRAST:  59m OMNIPAQUE IOHEXOL 300 MG/ML  SOLN COMPARISON:  03/17/2018 chest CT. FINDINGS: Cardiovascular:  Normal heart size. No significant pericardial effusion/thickening. Left anterior descending coronary atherosclerosis. Atherosclerotic nonaneurysmal thoracic aorta. Normal caliber pulmonary arteries. No central pulmonary emboli. Mediastinum/Nodes: No discrete thyroid nodules. Unremarkable esophagus. No pathologically enlarged axillary, mediastinal or hilar lymph nodes. Stable coarsely calcified nonenlarged subcarinal and right hilar nodes from prior granulomatous disease. Lungs/Pleura: No pneumothorax. No pleural effusion. No acute consolidative airspace disease. Masslike fibrosis in central left upper lobe measures 5.3 x 3.4 cm (series 5/image 53) previously 5.3 x 3.4 cm on 03/17/2018 chest CT using similar measurement technique, stable. No significant pulmonary nodules. Stable calcified granulomas in the central right lower lobe. Upper abdomen: Status post left nephrectomy. Enhancing 1.5 x 1.3 cm posterior left adrenal nodule (series 2/image 144) appears new. Musculoskeletal: No aggressive appearing focal osseous lesions. Marked thoracic spondylosis. IMPRESSION: 1. Enhancing 1.5 cm left adrenal nodule appears new, worrisome for left adrenal metastasis. Suggest PET-CT for further evaluation. 2. Stable masslike fibrosis in the central left upper lung lobe, with no findings to suggest local lung tumor recurrence. 3. No findings of metastatic disease in the chest. Aortic Atherosclerosis (ICD10-I70.0). These results will be called to the ordering clinician or representative by the Radiologist Assistant, and communication documented in the PACS or zVision Dashboard. Electronically Signed   By: JIlona SorrelM.D.   On: 09/20/2018 20:18   Nm Pet Image Restag (ps) Skull Base To Thigh  Result Date: 10/05/2018 CLINICAL DATA:  Subsequent treatment strategy for lung carcinoma. EXAM: NUCLEAR MEDICINE PET SKULL BASE TO THIGH TECHNIQUE: 8.7 mCi F-18 FDG was injected intravenously. Full-ring PET imaging was performed from the  skull base to thigh after the radiotracer. CT data was obtained and used for attenuation correction and anatomic localization. Fasting blood glucose: 102 mg/dl COMPARISON:  CT 09/20/2018, PET-CT 07/24/2016, CT 09/03/2017 FINDINGS: Mediastinal blood pool activity: SUV max 1.9 2.6 Liver activity: SUV max 2.6 NECK: No hypermetabolic lymph nodes in the neck. Incidental CT findings: none CHEST: Angular mass in the LEFT upper lobe is again noted measuring 4.6 x 2.3 cm. This mass has relatively low metabolic activity with SUV max equal 2.6 which is slightly above background blood pool. No hypermetabolic mediastinal lymph nodes. New semi-solid nodule in the superior segment of the LEFT lower lobe (image 27/8) is not seen on most recent CT scan which is only some 2 weeks prior, therefore favor inflammation or infection. No significant metabolic activity. ABDOMEN/PELVIS: The enlarged LEFT adrenal gland nodule of concern measures 1.6 cm (image 100/4) and has minimal metabolic activity (SUV max equal 2.0). This activity is less than background blood pool activity. The RIGHT adrenal gland normal. No hypermetabolic abdominopelvic lymph nodes. No abnormal activity in liver. Incidental CT  findings: none SKELETON: No focal hypermetabolic activity to suggest skeletal metastasis. Incidental CT findings: none IMPRESSION: 1. Low metabolic activity of enlarged LEFT adrenal gland is atypical for lung cancer metastasis. That the adrenal lesion is new, warrants attention on routine oncology surveillance. 2. Angular consolidation in the RIGHT upper lobe with low metabolic activity is favored post treatment benign inflammation. 3. New pulmonary nodule in the LEFT upper lobe which has developed in short term interval since last CT scan. Favor focus of benign inflammation or infection. As above, recommend attention on follow-up. Electronically Signed   By: Suzy Bouchard M.D.   On: 10/05/2018 16:59     ASSESSMENT/PLAN:  This is a very  pleasant 68 year old Caucasian male with a history of stage IIIa non-small cell lung cancer, squamous cell carcinoma.  He presented with a left upper lobe lung lesion in 2014.  The patient is status post concurrent chemoradiation with carboplatin and paclitaxel followed by observation since the patient was not a good candidate for consolidation chemotherapy at that time.  He also has a history of renal cell carcinoma.  He is status post left radical nephrectomy.   The patient had been on observation since 2015.   The patient recently had a restaging CT scan performed which showed a newly enhancing 1.5 cm left adrenal nodule.  The patient recently had a PET scan performed to further evaluate this.  Dr. Julien Nordmann personally and independently reviewed the scan and discussed the results with the patient today. The PET scan noted that the left adrenal gland lesion has low metabolic activity and atypical for lung cancer metastasis. There are also right upper lobe angular consolidation with low metabolic activity which favors benign inflammation as well as a left upper lobe nodule which was reported to favor inflammation or infection.   Dr. Julien Nordmann recommends that the patient continue on observation with a repeat CT scan of the chest and abdomen in 4 months.   We will see the patient back for a follow up visit in 4 months for evaluation and to review his scan results.   I spent some time counseling [place] with the patient the importance of tobacco cessation. He is currently not interested to quit now.  The patient was advised to call immediately if he has any concerning symptoms in the interval. The patient voices understanding of current disease status and treatment options and is in agreement with the current care plan. All questions were answered. The patient knows to call the clinic with any problems, questions or concerns. We can certainly see the patient much sooner if necessary  Orders Placed  This Encounter  Procedures  . CT Chest Wo Contrast    Standing Status:   Future    Standing Expiration Date:   10/06/2019    Order Specific Question:   ** REASON FOR EXAM (FREE TEXT)    Answer:   Restaging Lung Cancer    Order Specific Question:   Preferred imaging location?    Answer:   [facility]    Order Specific Question:   Radiology Contrast Protocol - do NOT remove file path    Answer:   [email or web address]  . CT Abdomen Wo Contrast    Standing Status:   Future    Standing Expiration Date:   10/06/2019    Order Specific Question:   ** REASON FOR EXAM (FREE TEXT)    Answer:   Restaging Lung Cancer    Order Specific Question:   Preferred imaging location?  Answer:   [facility]    Order Specific Question:   Is Oral Contrast requested for this exam?    Answer:   Yes, Per Radiology protocol    Order Specific Question:   Radiology Contrast Protocol - do NOT remove file path    Answer:   \\[email or web address]  . CBC with Differential ([place] Only)    Standing Status:   Future    Standing Expiration Date:   10/06/2019  . CMP ([facility] only)    Standing Status:   Future    Standing Expiration Date:   10/06/2019     Tobe Sos , PA-C 10/06/18  ADDENDUM: Hematology/Oncology Attending: I had a face-to-face encounter with the patient today.  I recommended his care plan.  This is a very pleasant 68 years old white male with history of a stage IIIa non-small cell lung cancer, squamous cell carcinoma diagnosed in 2014 status post a course of concurrent chemoradiation and has been on observation since that time.  The patient also has a history of left renal cell carcinoma status post left radical nephrectomy in March 2015.  He has been on observation since that time with routine imaging studies. His last CT scan of the chest showed suspicious lesion in the left adrenal gland concerning for metastatic  disease. The patient had a PET scan for further evaluation of this lesion and to rule out metastatic disease. I personally and independently reviewed the scan images and discussed the results with the patient and his wife. The PET scan showed no hypermetabolic activity in the left adrenal gland lesion which is atypical for metastatic lung cancer. It is recommended for the patient to continue on close observation and monitoring. The scan also showed a left upper lobe lung nodule concerning for inflammatory lesion. I discussed these results with the patient and his wife. I recommended for him to continue on observation with repeat CT scan of the chest and abdomen and 4 months for further evaluation of this lesion and to rule out any other metastatic disease. He was advised to call immediately if he has any concerning symptoms in the interval.  Disclaimer: This note was dictated with voice recognition software. Similar sounding words can inadvertently be transcribed and may be missed upon review. Eilleen Kempf, MD 10/06/18

## 2018-10-07 ENCOUNTER — Telehealth: Payer: Self-pay | Admitting: Internal Medicine

## 2018-10-07 NOTE — Telephone Encounter (Signed)
Scheduled appt per 9/30 los - mailed reminder letter with appt date and time

## 2019-01-03 ENCOUNTER — Encounter: Payer: Self-pay | Admitting: Internal Medicine

## 2019-02-01 ENCOUNTER — Ambulatory Visit (HOSPITAL_COMMUNITY)
Admission: RE | Admit: 2019-02-01 | Discharge: 2019-02-01 | Disposition: A | Discharge status: Home / Self Care | Payer: Medicare Other | Source: Ambulatory Visit | Attending: Physician Assistant | Admitting: Physician Assistant

## 2019-02-01 ENCOUNTER — Inpatient Hospital Stay: Payer: Medicare Other | Attending: Internal Medicine

## 2019-02-01 ENCOUNTER — Other Ambulatory Visit: Payer: Self-pay | Admitting: Physician Assistant

## 2019-02-01 ENCOUNTER — Telehealth: Payer: Self-pay | Admitting: Physician Assistant

## 2019-02-01 ENCOUNTER — Other Ambulatory Visit: Payer: Self-pay

## 2019-02-01 DIAGNOSIS — C3492 Malignant neoplasm of unspecified part of left bronchus or lung: Secondary | ICD-10-CM

## 2019-02-01 DIAGNOSIS — K5090 Crohn's disease, unspecified, without complications: Secondary | ICD-10-CM | POA: Insufficient documentation

## 2019-02-01 DIAGNOSIS — Z905 Acquired absence of kidney: Secondary | ICD-10-CM | POA: Diagnosis not present

## 2019-02-01 DIAGNOSIS — C642 Malignant neoplasm of left kidney, except renal pelvis: Secondary | ICD-10-CM | POA: Diagnosis present

## 2019-02-01 DIAGNOSIS — C3412 Malignant neoplasm of upper lobe, left bronchus or lung: Secondary | ICD-10-CM | POA: Diagnosis present

## 2019-02-01 DIAGNOSIS — Z85528 Personal history of other malignant neoplasm of kidney: Secondary | ICD-10-CM | POA: Insufficient documentation

## 2019-02-01 DIAGNOSIS — Z8547 Personal history of malignant neoplasm of testis: Secondary | ICD-10-CM | POA: Diagnosis not present

## 2019-02-01 DIAGNOSIS — E875 Hyperkalemia: Secondary | ICD-10-CM

## 2019-02-01 LAB — CMP (CANCER CENTER ONLY)
ALT: 12 U/L (ref 0–44)
AST: 14 U/L — ABNORMAL LOW (ref 15–41)
Albumin: 3.8 g/dL (ref 3.5–5.0)
Alkaline Phosphatase: 119 U/L (ref 38–126)
Anion gap: 9 (ref 5–15)
BUN: 19 mg/dL (ref 8–23)
CO2: 28 mmol/L (ref 22–32)
Calcium: 9.7 mg/dL (ref 8.9–10.3)
Chloride: 99 mmol/L (ref 98–111)
Creatinine: 1.47 mg/dL — ABNORMAL HIGH (ref 0.61–1.24)
GFR, Est AFR Am: 55 mL/min — ABNORMAL LOW (ref >60)
GFR, Estimated: 48 mL/min — ABNORMAL LOW (ref >60)
Glucose, Bld: 92 mg/dL (ref 70–99)
Potassium: 5.6 mmol/L — ABNORMAL HIGH (ref 3.5–5.1)
Sodium: 136 mmol/L (ref 135–145)
Total Bilirubin: 0.3 mg/dL (ref 0.3–1.2)
Total Protein: 7.9 g/dL (ref 6.5–8.1)

## 2019-02-01 LAB — CBC WITH DIFFERENTIAL (CANCER CENTER ONLY)
Abs Immature Granulocytes: 0.04 10*3/uL (ref 0.00–0.07)
Basophils Absolute: 0.1 10*3/uL (ref 0.0–0.1)
Basophils Relative: 1 %
Eosinophils Absolute: 0.2 10*3/uL (ref 0.0–0.5)
Eosinophils Relative: 2 %
HCT: 47.5 % (ref 39.0–52.0)
Hemoglobin: 15.4 g/dL (ref 13.0–17.0)
Immature Granulocytes: 0 %
Lymphocytes Relative: 15 %
Lymphs Abs: 1.5 10*3/uL (ref 0.7–4.0)
MCH: 29.2 pg (ref 26.0–34.0)
MCHC: 32.4 g/dL (ref 30.0–36.0)
MCV: 90 fL (ref 80.0–100.0)
Monocytes Absolute: 0.6 10*3/uL (ref 0.1–1.0)
Monocytes Relative: 6 %
Neutro Abs: 7.2 10*3/uL (ref 1.7–7.7)
Neutrophils Relative %: 76 %
Platelet Count: 302 10*3/uL (ref 150–400)
RBC: 5.28 MIL/uL (ref 4.22–5.81)
RDW: 13.6 % (ref 11.5–15.5)
WBC Count: 9.5 10*3/uL (ref 4.0–10.5)
nRBC: 0 % (ref 0.0–0.2)

## 2019-02-01 MED ORDER — SODIUM POLYSTYRENE SULFONATE PO POWD: Freq: Once | ORAL | 0 refills | Status: AC

## 2019-02-01 MED ORDER — IOHEXOL 9 MG/ML PO SOLN
1000.0000 mL | ORAL | Status: AC
  Administered 2019-02-01: 13:00:00 1000 mL via ORAL

## 2019-02-01 MED ORDER — IOHEXOL 9 MG/ML PO SOLN
ORAL | Status: AC
  Filled 2019-02-01: qty 1000

## 2019-02-01 NOTE — Telephone Encounter (Signed)
Spoke to the patient's wife regarding his elevated potassium today. Sent 15 mg of Kayexalate to his pharmacy to take. Patient's wife denies any potassium supplements or ACEI use. We will recheck his potassium when he comes in on 02/03/2019

## 2019-02-02 ENCOUNTER — Other Ambulatory Visit: Payer: Self-pay | Admitting: Physician Assistant

## 2019-02-02 ENCOUNTER — Other Ambulatory Visit: Payer: Self-pay | Admitting: *Deleted

## 2019-02-02 DIAGNOSIS — E875 Hyperkalemia: Secondary | ICD-10-CM

## 2019-02-02 MED ORDER — SODIUM POLYSTYRENE SULFONATE PO POWD: Freq: Once | ORAL | 0 refills | Status: AC

## 2019-02-03 ENCOUNTER — Inpatient Hospital Stay (HOSPITAL_BASED_OUTPATIENT_CLINIC_OR_DEPARTMENT_OTHER): Payer: Medicare Other | Admitting: Internal Medicine

## 2019-02-03 ENCOUNTER — Encounter: Payer: Self-pay | Admitting: Internal Medicine

## 2019-02-03 ENCOUNTER — Inpatient Hospital Stay: Payer: Medicare Other

## 2019-02-03 ENCOUNTER — Other Ambulatory Visit: Payer: Self-pay

## 2019-02-03 DIAGNOSIS — E875 Hyperkalemia: Secondary | ICD-10-CM

## 2019-02-03 DIAGNOSIS — C3490 Malignant neoplasm of unspecified part of unspecified bronchus or lung: Secondary | ICD-10-CM

## 2019-02-03 DIAGNOSIS — C3412 Malignant neoplasm of upper lobe, left bronchus or lung: Secondary | ICD-10-CM | POA: Diagnosis not present

## 2019-02-03 LAB — BASIC METABOLIC PANEL - CANCER CENTER ONLY
Anion gap: 8 (ref 5–15)
BUN: 17 mg/dL (ref 8–23)
CO2: 31 mmol/L (ref 22–32)
Calcium: 9.4 mg/dL (ref 8.9–10.3)
Chloride: 102 mmol/L (ref 98–111)
Creatinine: 1.48 mg/dL — ABNORMAL HIGH (ref 0.61–1.24)
GFR, Est AFR Am: 55 mL/min — ABNORMAL LOW (ref >60)
GFR, Estimated: 47 mL/min — ABNORMAL LOW (ref >60)
Glucose, Bld: 95 mg/dL (ref 70–99)
Potassium: 5 mmol/L (ref 3.5–5.1)
Sodium: 141 mmol/L (ref 135–145)

## 2019-02-03 NOTE — Progress Notes (Signed)
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 2) Status post left laparoscopic radical nephrectomy under the care of Dr. Louis Meckel on 04/04/2013.  CURRENT THERAPY: Observation.  INTERVAL HISTORY: [name] 69 y.o. male returns to the clinic today for follow-up visit.  The patient is feeling fine today with no concerning complaints except for the baseline shortness of breath.  He denied having any current chest pain, cough or hemoptysis.  Unfortunately he continues to smoke at regular basis.  He denied having any nausea, vomiting, diarrhea or constipation.  He had repeat CT scan of the chest, abdomen pelvis performed recently and he is here for evaluation and discussion of his discuss results.  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  MEDICATIONS:  Current Outpatient Medications  Medication Sig Dispense Refill   hydrOXYzine (ATARAX/VISTARIL) 50 MG tablet Take 12.5 mg by mouth at bedtime as needed (for sleep.).     Oxycodone HCl 10 MG TABS Take 10 mg by mouth 5 (five) times daily.      Probiotic Product (PROBIOTIC DAILY PO) Take 1 capsule by mouth daily.     Vitamin D-Vitamin K (D3 + K2 DOTS PO) Take 1 tablet by mouth at bedtime.     No current facility-administered medications for this visit.    SURGICAL HISTORY:  Past Surgical History:  Procedure Laterality Date   APPENDECTOMY     COLONOSCOPY     HERNIA REPAIR     x 2   LAPAROSCOPIC NEPHRECTOMY Left 04/04/2013   Procedure: LAPAROSCOPIC LEFT RADICAL NEPHRECTOMY ;  Surgeon: [name], MD;  Location: [facility];  Service: Urology;  Laterality: Left;   LUNG BIOPSY Left 08/08/2016   Procedure: LEFT LUNG BIOPSY;  Surgeon: Grace Isaac, MD;  Location: [facility];  Service: Thoracic;  Laterality: Left;   testicular removal  2000   VIDEO BRONCHOSCOPY N/A 11/30/2012   Procedure: VIDEO BRONCHOSCOPY WITH FLUORO;  Surgeon: Elsie Stain, MD;  Location: [facility];  Service: Cardiopulmonary;  Laterality: N/A;   VIDEO BRONCHOSCOPY N/A 08/08/2016   Procedure: VIDEO BRONCHOSCOPY;  Surgeon: Grace Isaac, MD;  Location: [facility];  Service: Thoracic;  Laterality: N/A;  REVIEW OF SYSTEMS:  A comprehensive review of systems was negative except for: Respiratory: positive for dyspnea on exertion   PHYSICAL EXAMINATION: General appearance: alert, cooperative and no distress Head: Normocephalic, without obvious abnormality, atraumatic Neck: no adenopathy, no JVD, supple, symmetrical, trachea midline and thyroid not enlarged, symmetric, no tenderness/mass/nodules Lymph nodes: Cervical, supraclavicular, and axillary nodes normal. Resp: clear to auscultation bilaterally Back: symmetric, no curvature. ROM normal. No CVA tenderness. Cardio:  regular rate and rhythm, S1, S2 normal, no murmur, click, rub or gallop GI: soft, non-tender; bowel sounds normal; no masses,  no organomegaly Extremities: extremities normal, atraumatic, no cyanosis or edema  ECOG PERFORMANCE STATUS: 1 - Symptomatic but completely ambulatory  Blood pressure 122/84, pulse 86, temperature (!) 97.4 F (36.3 C), temperature source Oral, resp. rate 18, height 5' 7"  (1.702 m), weight 171 lb 4.8 oz (77.7 kg), SpO2 97 %.  LABORATORY DATA: Lab Results  Component Value Date   WBC 9.5 02/01/2019   HGB 15.4 02/01/2019   HCT 47.5 02/01/2019   MCV 90.0 02/01/2019   PLT 302 02/01/2019      Chemistry      Component Value Date/Time   NA 136 02/01/2019 1354   NA 142 06/30/2016 1406   K 5.6 (H) 02/01/2019 1354   K 4.8 06/30/2016 1406   CL 99 02/01/2019 1354   CO2 28 02/01/2019 1354   CO2 29 06/30/2016 1406   BUN 19 02/01/2019 1354   BUN 14.8 06/30/2016 1406   CREATININE 1.47 (H) 02/01/2019 1354   CREATININE 1.4 (H) 06/30/2016 1406      Component Value Date/Time   CALCIUM 9.7 02/01/2019 1354   CALCIUM 9.8 06/30/2016 1406   ALKPHOS 119 02/01/2019 1354   ALKPHOS 122 06/30/2016 1406   AST 14 (L) 02/01/2019 1354   AST 11 06/30/2016 1406   ALT 12 02/01/2019 1354   ALT 13 06/30/2016 1406   BILITOT 0.3 02/01/2019 1354   BILITOT 0.40 06/30/2016 1406       RADIOGRAPHIC STUDIES: CT Abdomen Wo Contrast  Result Date: 02/01/2019 CLINICAL DATA:  Lung cancer restaging EXAM: CT CHEST AND ABDOMEN WITHOUT CONTRAST TECHNIQUE: Multidetector CT imaging of the chest and abdomen was performed following the standard protocol without intravenous contrast. COMPARISON:  PET-CT from 10/05/2018 FINDINGS: CT CHEST FINDINGS WITHOUT CONTRAST Cardiovascular: Coronary, aortic arch, and branch vessel atherosclerotic vascular disease. Mediastinum/Nodes: Calcified subcarinal lymph node compatible with old granulomatous disease. Lungs/Pleura: Indistinct but abnormal density at the left  hilum resulting in occlusion of the majority of the left upper lobe tracheobronchial tree and a triangular band of atelectasis involving much of the left upper lobe although with some sparing of the lingula. The left hilar lesion demonstrated only low-grade metabolic activity on prior PET-CT roughly similar to the background blood pool, and much of the appearance on today's exam could reflect successfully treated disease rather than necessarily active malignancy. The lack of IV contrast makes it difficult to separate pulmonary vasculature from underlying lesion in this region. I do not see a significant contour change. Right infrahilar clustered calcifications compatible with old granulomatous disease. The ground-glass density nodule previously shown in the left lower lobe has resolved. Musculoskeletal: Thoracic spondylosis. CT ABDOMEN FINDINGS WITHOUT CONTRAST Hepatobiliary: Unremarkable Pancreas: Unremarkable Spleen: Punctate calcifications compatible with old granulomatous disease. Adrenals/Urinary Tract: Left nephrectomy. The right kidney appears unremarkable. 2.2 by 1.9 cm left adrenal mass, internal density 30 Hounsfield units. By my measurements this adrenal mass measured 1.4 by 1.4 cm on 10/05/2018 and was not  readily apparent on the prior PET-CT from 07/24/2016. Stomach/Bowel: Unremarkable Vascular/Lymphatic: Aortoiliac atherosclerotic vascular disease. Other: No supplemental non-categorized findings. Musculoskeletal: Ventral hernia mesh. Bridging spurring of the upper sacroiliac joints. Endplate sclerosis at W7-3 and L4-5 with associated Schmorl's nodes. IMPRESSION: 1. Enlarging left adrenal mass, currently 2.2 by 1.9 cm and previously 1.4 by 1.4 cm on 10/05/2018. This mass has nonspecific internal density. Given the rapid growth, this may well represent a metastatic lesion. 2. Indistinct left hilar density with associated occlusion of the majority of the left upper lobe tracheobronchial tree as shown  previously, with a resulting band of atelectasis in the left upper lobe. The contour of this process is not changed, and this only has low-grade metabolic activity on the prior PET-CT, and accordingly the left hilar density may represent previously treated tumor rather than necessarily being active tumor. 3. The ground-glass density left lower lobe nodule shown on the prior PET-CT has resolved. 4. Other imaging findings of potential clinical significance: Aortic Atherosclerosis (ICD10-I70.0). Coronary atherosclerosis. Old granulomatous disease. Electronically Signed   By: Van Clines M.D.   On: 02/01/2019 16:20   CT Chest Wo Contrast  Result Date: 02/01/2019 CLINICAL DATA:  Lung cancer restaging EXAM: CT CHEST AND ABDOMEN WITHOUT CONTRAST TECHNIQUE: Multidetector CT imaging of the chest and abdomen was performed following the standard protocol without intravenous contrast. COMPARISON:  PET-CT from 10/05/2018 FINDINGS: CT CHEST FINDINGS WITHOUT CONTRAST Cardiovascular: Coronary, aortic arch, and branch vessel atherosclerotic vascular disease. Mediastinum/Nodes: Calcified subcarinal lymph node compatible with old granulomatous disease. Lungs/Pleura: Indistinct but abnormal density at the left hilum resulting in occlusion of the majority of the left upper lobe tracheobronchial tree and a triangular band of atelectasis involving much of the left upper lobe although with some sparing of the lingula. The left hilar lesion demonstrated only low-grade metabolic activity on prior PET-CT roughly similar to the background blood pool, and much of the appearance on today's exam could reflect successfully treated disease rather than necessarily active malignancy. The lack of IV contrast makes it difficult to separate pulmonary vasculature from underlying lesion in this region. I do not see a significant contour change. Right infrahilar clustered calcifications compatible with old granulomatous disease. The ground-glass  density nodule previously shown in the left lower lobe has resolved. Musculoskeletal: Thoracic spondylosis. CT ABDOMEN FINDINGS WITHOUT CONTRAST Hepatobiliary: Unremarkable Pancreas: Unremarkable Spleen: Punctate calcifications compatible with old granulomatous disease. Adrenals/Urinary Tract: Left nephrectomy. The right kidney appears unremarkable. 2.2 by 1.9 cm left adrenal mass, internal density 30 Hounsfield units. By my measurements this adrenal mass measured 1.4 by 1.4 cm on 10/05/2018 and was not readily apparent on the prior PET-CT from 07/24/2016. Stomach/Bowel: Unremarkable Vascular/Lymphatic: Aortoiliac atherosclerotic vascular disease. Other: No supplemental non-categorized findings. Musculoskeletal: Ventral hernia mesh. Bridging spurring of the upper sacroiliac joints. Endplate sclerosis at X1-0 and L4-5 with associated Schmorl's nodes. IMPRESSION: 1. Enlarging left adrenal mass, currently 2.2 by 1.9 cm and previously 1.4 by 1.4 cm on 10/05/2018. This mass has nonspecific internal density. Given the rapid growth, this may well represent a metastatic lesion. 2. Indistinct left hilar density with associated occlusion of the majority of the left upper lobe tracheobronchial tree as shown previously, with a resulting band of atelectasis in the left upper lobe. The contour of this process is not changed, and this only has low-grade metabolic activity on the prior PET-CT, and accordingly the left hilar density may represent previously treated tumor rather than necessarily being active tumor. 3. The ground-glass density left lower lobe  nodule shown on the prior PET-CT has resolved. 4. Other imaging findings of potential clinical significance: Aortic Atherosclerosis (ICD10-I70.0). Coronary atherosclerosis. Old granulomatous disease. Electronically Signed   By: Van Clines M.D.   On: 02/01/2019 16:20    ASSESSMENT AND PLAN:  This is a very pleasant 69 years old white male with history of stage IIIa  non-small cell lung cancer status post concurrent chemoradiation with weekly carboplatin and paclitaxel followed by observation since the patient was noted good candidate for consolidation chemotherapy at that time. He also has a history of renal cell carcinoma status post left radical nephrectomy. The patient has been in observation since January 2015. The patient has been on observation since that time and he is feeling fine with no concerning complaints. Repeat CT scan of the chest, abdomen pelvis performed recently showed enlarging left adrenal mass but the mass has low hypermetabolic activity on the previous PET scan in September 2020. I discussed the scan results with the patient and give him the option of referral to radiation oncology for consideration of radiotherapy versus continuous observation and monitoring with repeat CT scan in 2 months. The patient would like to continue on observation for now. I will see him back for follow-up visit in 2 months for evaluation with repeat CT scan of the chest, abdomen pelvis for restaging of his disease. I strongly encouraged the patient to quit smoking. He was advised to call immediately if he has any concerning symptoms in the interval. I strongly encouraged the patient to call immediately if he has any concerning chest pain or shortness of breath in the interval. Disclaimer: This note was dictated with voice recognition software. Similar sounding words can inadvertently be transcribed and may not be corrected upon review.

## 2019-02-03 NOTE — Patient Instructions (Signed)
Steps to Quit Smoking Smoking tobacco is the leading cause of preventable death. It can affect almost every organ in the body. Smoking puts you and people around you at risk for many serious, long-lasting (chronic) diseases. Quitting smoking can be hard, but it is one of the best things that you can do for your health. It is never too late to quit. How do I get ready to quit? When you decide to quit smoking, make a plan to help you succeed. Before you quit:  Pick a date to quit. Set a date within the next 2 weeks to give you time to prepare.  Write down the reasons why you are quitting. Keep this list in places where you will see it often.  Tell your family, friends, and co-workers that you are quitting. Their support is important.  Talk with your doctor about the choices that may help you quit.  Find out if your health insurance will pay for these treatments.  Know the people, places, things, and activities that make you want to smoke (triggers). Avoid them. What first steps can I take to quit smoking?  Throw away all cigarettes at home, at work, and in your car.  Throw away the things that you use when you smoke, such as ashtrays and lighters.  Clean your car. Make sure to empty the ashtray.  Clean your home, including curtains and carpets. What can I do to help me quit smoking? Talk with your doctor about taking medicines and seeing a counselor at the same time. You are more likely to succeed when you do both.  If you are pregnant or breastfeeding, talk with your doctor about counseling or other ways to quit smoking. Do not take medicine to help you quit smoking unless your doctor tells you to do so. To quit smoking: Quit right away  Quit smoking totally, instead of slowly cutting back on how much you smoke over a period of time.  Go to counseling. You are more likely to quit if you go to counseling sessions regularly. Take medicine You may take medicines to help you quit. Some  medicines need a prescription, and some you can buy over-the-counter. Some medicines may contain a drug called nicotine to replace the nicotine in cigarettes. Medicines may:  Help you to stop having the desire to smoke (cravings).  Help to stop the problems that come when you stop smoking (withdrawal symptoms). Your doctor may ask you to use:  Nicotine patches, gum, or lozenges.  Nicotine inhalers or sprays.  Non-nicotine medicine that is taken by mouth. Find resources Find resources and other ways to help you quit smoking and remain smoke-free after you quit. These resources are most helpful when you use them often. They include:  Online chats with a counselor.  Phone quitlines.  Printed self-help materials.  Support groups or group counseling.  Text messaging programs.  Mobile phone apps. Use apps on your mobile phone or tablet that can help you stick to your quit plan. There are many free apps for mobile phones and tablets as well as websites. Examples include Quit Guide from the CDC and smokefree.gov  What things can I do to make it easier to quit?   Talk to your family and friends. Ask them to support and encourage you.  Call a phone quitline (1-800-QUIT-NOW), reach out to support groups, or work with a counselor.  Ask people who smoke to not smoke around you.  Avoid places that make you want to smoke,   such as: ? Bars. ? Parties. ? Smoke-break areas at work.  Spend time with people who do not smoke.  Lower the stress in your life. Stress can make you want to smoke. Try these things to help your stress: ? Getting regular exercise. ? Doing deep-breathing exercises. ? Doing yoga. ? Meditating. ? Doing a body scan. To do this, close your eyes, focus on one area of your body at a time from head to toe. Notice which parts of your body are tense. Try to relax the muscles in those areas. How will I feel when I quit smoking? Day 1 to 3 weeks Within the first 24 hours,  you may start to have some problems that come from quitting tobacco. These problems are very bad 2-3 days after you quit, but they do not often last for more than 2-3 weeks. You may get these symptoms:  Mood swings.  Feeling restless, nervous, angry, or annoyed.  Trouble concentrating.  Dizziness.  Strong desire for high-sugar foods and nicotine.  Weight gain.  Trouble pooping (constipation).  Feeling like you may vomit (nausea).  Coughing or a sore throat.  Changes in how the medicines that you take for other issues work in your body.  Depression.  Trouble sleeping (insomnia). Week 3 and afterward After the first 2-3 weeks of quitting, you may start to notice more positive results, such as:  Better sense of smell and taste.  Less coughing and sore throat.  Slower heart rate.  Lower blood pressure.  Clearer skin.  Better breathing.  Fewer sick days. Quitting smoking can be hard. Do not give up if you fail the first time. Some people need to try a few times before they succeed. Do your best to stick to your quit plan, and talk with your doctor if you have any questions or concerns. Summary  Smoking tobacco is the leading cause of preventable death. Quitting smoking can be hard, but it is one of the best things that you can do for your health.  When you decide to quit smoking, make a plan to help you succeed.  Quit smoking right away, not slowly over a period of time.  When you start quitting, seek help from your doctor, family, or friends. This information is not intended to replace advice given to you by your health care provider. Make sure you discuss any questions you have with your health care provider. Document Revised: 09/17/2018 Document Reviewed: 03/13/2018 Elsevier Patient Education  2020 [employer]  

## 2019-02-08 ENCOUNTER — Telehealth: Payer: Self-pay | Admitting: Internal Medicine

## 2019-02-08 NOTE — Telephone Encounter (Signed)
Scheduled per los. Called and left msg. Mailed printout  °

## 2019-02-10 ENCOUNTER — Telehealth: Payer: Self-pay | Admitting: Internal Medicine

## 2019-02-10 NOTE — Telephone Encounter (Signed)
Returned patient's phone call regarding rescheduling lab appointment time, per patient's request appointment has been rescheduled.

## 2019-04-01 ENCOUNTER — Other Ambulatory Visit: Payer: Medicare Other

## 2019-04-01 ENCOUNTER — Ambulatory Visit (HOSPITAL_COMMUNITY)
Admission: RE | Admit: 2019-04-01 | Discharge: 2019-04-01 | Disposition: A | Discharge status: Home / Self Care | Payer: Medicare Other | Source: Ambulatory Visit | Attending: Internal Medicine | Admitting: Internal Medicine

## 2019-04-01 ENCOUNTER — Other Ambulatory Visit: Payer: Self-pay

## 2019-04-01 ENCOUNTER — Inpatient Hospital Stay: Payer: Medicare Other | Attending: Internal Medicine

## 2019-04-01 DIAGNOSIS — Z9221 Personal history of antineoplastic chemotherapy: Secondary | ICD-10-CM | POA: Insufficient documentation

## 2019-04-01 DIAGNOSIS — C3490 Malignant neoplasm of unspecified part of unspecified bronchus or lung: Secondary | ICD-10-CM | POA: Diagnosis present

## 2019-04-01 DIAGNOSIS — Z85118 Personal history of other malignant neoplasm of bronchus and lung: Secondary | ICD-10-CM | POA: Insufficient documentation

## 2019-04-01 DIAGNOSIS — Z85528 Personal history of other malignant neoplasm of kidney: Secondary | ICD-10-CM | POA: Insufficient documentation

## 2019-04-01 DIAGNOSIS — Z905 Acquired absence of kidney: Secondary | ICD-10-CM | POA: Insufficient documentation

## 2019-04-01 DIAGNOSIS — Z8547 Personal history of malignant neoplasm of testis: Secondary | ICD-10-CM | POA: Insufficient documentation

## 2019-04-01 LAB — CMP (CANCER CENTER ONLY)
ALT: 8 U/L (ref 0–44)
AST: 11 U/L — ABNORMAL LOW (ref 15–41)
Albumin: 3.5 g/dL (ref 3.5–5.0)
Alkaline Phosphatase: 107 U/L (ref 38–126)
Anion gap: 8 (ref 5–15)
BUN: 14 mg/dL (ref 8–23)
CO2: 29 mmol/L (ref 22–32)
Calcium: 9.6 mg/dL (ref 8.9–10.3)
Chloride: 103 mmol/L (ref 98–111)
Creatinine: 1.33 mg/dL — ABNORMAL HIGH (ref 0.61–1.24)
GFR, Est AFR Am: >60 mL/min (ref >60)
GFR, Estimated: 54 mL/min — ABNORMAL LOW (ref >60)
Glucose, Bld: 90 mg/dL (ref 70–99)
Potassium: 4.7 mmol/L (ref 3.5–5.1)
Sodium: 140 mmol/L (ref 135–145)
Total Bilirubin: 0.4 mg/dL (ref 0.3–1.2)
Total Protein: 7.3 g/dL (ref 6.5–8.1)

## 2019-04-01 LAB — CBC WITH DIFFERENTIAL (CANCER CENTER ONLY)
Abs Immature Granulocytes: 0.03 10*3/uL (ref 0.00–0.07)
Basophils Absolute: 0.1 10*3/uL (ref 0.0–0.1)
Basophils Relative: 1 %
Eosinophils Absolute: 0.1 10*3/uL (ref 0.0–0.5)
Eosinophils Relative: 1 %
HCT: 47 % (ref 39.0–52.0)
Hemoglobin: 15.1 g/dL (ref 13.0–17.0)
Immature Granulocytes: 0 %
Lymphocytes Relative: 14 %
Lymphs Abs: 1.2 10*3/uL (ref 0.7–4.0)
MCH: 29.7 pg (ref 26.0–34.0)
MCHC: 32.1 g/dL (ref 30.0–36.0)
MCV: 92.5 fL (ref 80.0–100.0)
Monocytes Absolute: 0.5 10*3/uL (ref 0.1–1.0)
Monocytes Relative: 6 %
Neutro Abs: 6.5 10*3/uL (ref 1.7–7.7)
Neutrophils Relative %: 78 %
Platelet Count: 304 10*3/uL (ref 150–400)
RBC: 5.08 MIL/uL (ref 4.22–5.81)
RDW: 13.2 % (ref 11.5–15.5)
WBC Count: 8.4 10*3/uL (ref 4.0–10.5)
nRBC: 0 % (ref 0.0–0.2)

## 2019-04-04 ENCOUNTER — Other Ambulatory Visit: Payer: Self-pay

## 2019-04-04 ENCOUNTER — Encounter: Payer: Self-pay | Admitting: Internal Medicine

## 2019-04-04 ENCOUNTER — Inpatient Hospital Stay (HOSPITAL_BASED_OUTPATIENT_CLINIC_OR_DEPARTMENT_OTHER): Payer: Medicare Other | Admitting: Internal Medicine

## 2019-04-04 ENCOUNTER — Telehealth: Payer: Self-pay | Admitting: Internal Medicine

## 2019-04-04 VITALS — BP 116/75 | HR 95 | Temp 98.5°F | Resp 18 | Ht 67.0 in | Wt 170.2 lb

## 2019-04-04 DIAGNOSIS — F17200 Nicotine dependence, unspecified, uncomplicated: Secondary | ICD-10-CM

## 2019-04-04 DIAGNOSIS — C3490 Malignant neoplasm of unspecified part of unspecified bronchus or lung: Secondary | ICD-10-CM | POA: Diagnosis not present

## 2019-04-04 DIAGNOSIS — C642 Malignant neoplasm of left kidney, except renal pelvis: Secondary | ICD-10-CM

## 2019-04-04 DIAGNOSIS — C3492 Malignant neoplasm of unspecified part of left bronchus or lung: Secondary | ICD-10-CM

## 2019-04-04 DIAGNOSIS — Z85118 Personal history of other malignant neoplasm of bronchus and lung: Secondary | ICD-10-CM | POA: Diagnosis not present

## 2019-04-04 NOTE — Progress Notes (Signed)
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 2) Status post left laparoscopic radical nephrectomy under the care of Dr. Louis Meckel on 04/04/2013.  CURRENT THERAPY: Observation.  INTERVAL HISTORY: [name] 69 y.o. male returns to the clinic today for follow-up visit.  The patient is feeling fine today with no concerning complaints except for the baseline shortness of breath increased with exertion but no significant chest pain, cough or hemoptysis.  He denied having any fever or chills.  He has no nausea, vomiting, diarrhea or constipation.  He has no headache or visual changes.  He denied having any recent weight loss or night sweats.  The patient had repeat CT scan of the chest, abdomen pelvis performed recently and he is here for evaluation and discussion of his discuss results.  MEDICAL HISTORY: Past Medical History:  Diagnosis Date  . Arthritis   . Cancer of kidney ([place])    left  . Complication of anesthesia    Very little movement of neck- "self fusioning"  . Crohn's disease ([place])    no issues since 2012  . Difficulty sleeping    occasionally  . Dyspnea    with a little exertion,  "smoked cigarettes all my life"  . History of blood transfusion    "chron's flare"  . Lung cancer ([place])   . Renal cell carcinoma of left kidney ([place]) 02/20/2015  . Renal mass   . Renal mass, left   . Sleep apnea  05/2016  . Testicular cancer ([place]) 2000   s/p resection, no recurrence    ALLERGIES:  has No Known Allergies.  MEDICATIONS:  Current Outpatient Medications  Medication Sig Dispense Refill  . hydrOXYzine (ATARAX/VISTARIL) 50 MG tablet Take 12.5 mg by mouth at bedtime as needed (for sleep.).    Marland Kitchen OVER THE COUNTER MEDICATION Take 1 tablet by mouth daily.    . Oxycodone HCl 10 MG TABS Take 10 mg by mouth 5 (five) times daily.     . Probiotic Product (PROBIOTIC DAILY PO) Take 1 capsule by mouth daily.    . Vitamin D-Vitamin K (D3 + K2 DOTS PO) Take 1 tablet by mouth at bedtime.     No current facility-administered medications for this visit.    SURGICAL HISTORY:  Past Surgical History:  Procedure Laterality Date  . APPENDECTOMY    . COLONOSCOPY    . HERNIA REPAIR     x 2  . LAPAROSCOPIC NEPHRECTOMY Left 04/04/2013   Procedure: LAPAROSCOPIC LEFT RADICAL NEPHRECTOMY ;  Surgeon: [name], MD;  Location: [facility];  Service: Urology;  Laterality: Left;  . LUNG BIOPSY Left 08/08/2016   Procedure: LEFT LUNG BIOPSY;  Surgeon: Grace Isaac, MD;  Location: [facility];  Service: Thoracic;  Laterality: Left;  . testicular removal  2000  . VIDEO BRONCHOSCOPY N/A 11/30/2012   Procedure: VIDEO BRONCHOSCOPY WITH FLUORO;  Surgeon: Elsie Stain, MD;  Location: [facility];  Service:  Cardiopulmonary;  Laterality: N/A;  . VIDEO BRONCHOSCOPY N/A 08/08/2016   Procedure: VIDEO BRONCHOSCOPY;  Surgeon: Grace Isaac, MD;  Location: [facility];  Service: Thoracic;  Laterality: N/A;    REVIEW OF SYSTEMS:  Constitutional: positive for fatigue Eyes: negative Ears, nose, mouth, throat, and face: negative Respiratory: positive for dyspnea on exertion Cardiovascular: negative Gastrointestinal: negative Genitourinary:negative Integument/breast: negative Hematologic/lymphatic: negative Musculoskeletal:negative Neurological: negative Behavioral/Psych: negative Endocrine: negative  Allergic/Immunologic: negative   PHYSICAL EXAMINATION: General appearance: alert, cooperative and no distress Head: Normocephalic, without obvious abnormality, atraumatic Neck: no adenopathy, no JVD, supple, symmetrical, trachea midline and thyroid not enlarged, symmetric, no tenderness/mass/nodules Lymph nodes: Cervical, supraclavicular, and axillary nodes normal. Resp: clear to auscultation bilaterally Back: symmetric, no curvature. ROM normal. No CVA tenderness. Cardio: regular rate and rhythm, S1, S2 normal, no murmur, click, rub or gallop GI: soft, non-tender; bowel sounds normal; no masses,  no organomegaly Extremities: extremities normal, atraumatic, no cyanosis or edema Neurologic: Alert and oriented X 3, normal strength and tone. Normal symmetric reflexes. Normal coordination and gait  ECOG PERFORMANCE STATUS: 1 - Symptomatic but completely ambulatory  Blood pressure 116/75, pulse 95, temperature 98.5 F (36.9 C), temperature source Oral, resp. rate 18, height 5' 7"  (1.702 m), weight 170 lb 3.2 oz (77.2 kg), SpO2 95 %.  LABORATORY DATA: Lab Results  Component Value Date   WBC 8.4 04/01/2019   HGB 15.1 04/01/2019   HCT 47.0 04/01/2019   MCV 92.5 04/01/2019   PLT 304 04/01/2019      Chemistry      Component Value Date/Time   NA 140 04/01/2019 1208   NA 142 06/30/2016 1406   K 4.7 04/01/2019 1208   K 4.8 06/30/2016 1406   CL 103 04/01/2019 1208   CO2 29 04/01/2019 1208   CO2 29 06/30/2016 1406   BUN 14 04/01/2019 1208   BUN 14.8 06/30/2016 1406   CREATININE 1.33 (H) 04/01/2019 1208   CREATININE 1.4 (H) 06/30/2016 1406      Component Value Date/Time   CALCIUM 9.6 04/01/2019 1208   CALCIUM 9.8 06/30/2016 1406   ALKPHOS 107 04/01/2019 1208   ALKPHOS 122 06/30/2016 1406   AST 11 (L) 04/01/2019 1208   AST 11 06/30/2016 1406   ALT 8 04/01/2019 1208   ALT 13 06/30/2016 1406   BILITOT 0.4 04/01/2019 1208   BILITOT 0.40 06/30/2016 1406       RADIOGRAPHIC  STUDIES: CT Abdomen Pelvis Wo Contrast  Result Date: 04/01/2019 CLINICAL DATA:  Left upper lobe squamous cell carcinoma restaging. History of renal cell carcinoma. Currently on observation. EXAM: CT CHEST, ABDOMEN AND PELVIS WITHOUT CONTRAST TECHNIQUE: Multidetector CT imaging of the chest, abdomen and pelvis was performed following the standard protocol without IV contrast. COMPARISON:  02/01/2019 FINDINGS: CT CHEST FINDINGS Cardiovascular: Coronary, aortic arch, and branch vessel atherosclerotic vascular disease. Mild ascending aortic ectasia without overt aneurysm. Small stable anterior pericardial effusion. Mediastinum/Nodes: Stable calcified subcarinal lymph node. Lungs/Pleura: Continued occlusion of segmental branches of the left upper lobe and superior segment left lower lobe tracheobronchial tree associated with the masslike density and volume loss surrounding the left pulmonary artery at the left hilum. Differentiation of the pulmonary artery from the atelectasis and potential mass in this vicinity is problematic. In general the contour is not changed from 02/01/2019 and there is only been low-grade activity in PET-CT on this region which may reflect treated tumor. Right infrahilar clustered calcified lymph nodes, unchanged. No new pulmonary nodule. Musculoskeletal: Old healed right rib  fractures. Thoracic spondylosis. CT ABDOMEN PELVIS FINDINGS Hepatobiliary: Mildly contracted gallbladder. Otherwise unremarkable. Pancreas: Unremarkable Spleen: Unremarkable Adrenals/Urinary Tract: Stable 2.6 by 2.0 cm left adrenal mass, noncontrast density 22 Hounsfield units which is nonspecific. Right adrenal gland unremarkable. Left kidney is absent. Vascular calcifications along the right renal hilum. No urinary tract calculi are observed. Stomach/Bowel: Unremarkable Vascular/Lymphatic: Aortoiliac atherosclerotic vascular disease. No pathologic adenopathy is identified. Reproductive: Left orchectomy. Other: No  supplemental non-categorized findings. Musculoskeletal: Ventral periumbilical hernia mesh noted. Fused sacroiliac joints. Degenerative bilateral hip arthropathy. Lower lumbar spondylosis and degenerative disc disease causing bilateral foraminal impingement at L4-5 likely mild right foraminal impingement at L5-S1. IMPRESSION: 1. Stable appearance of the left hilum with volume loss and occlusion of segmental branches of the left upper lobe and superior segment left lower lobe tracheobronchial tree. No interval to suggest change recurrent active malignancy in the lung. 2. The nonspecific left adrenal mass is currently stable in size, although had previously enlarged and probably merits careful surveillance. 3. Other imaging findings of potential clinical significance: Coronary atherosclerosis. Small stable anterior pericardial effusion. Old granulomatous disease. Left orchectomy. Lower lumbar spondylosis and degenerative disc disease causing bilateral foraminal impingement at L4-5 and likely mild right foraminal impingement at L5-S1. Fused sacroiliac joints. Degenerative bilateral hip arthropathy. Aortic Atherosclerosis (ICD10-I70.0). Electronically Signed   By: Van Clines M.D.   On: 04/01/2019 15:42   CT Chest Wo Contrast  Result Date: 04/01/2019 CLINICAL DATA:  Left upper lobe squamous cell carcinoma restaging. History of renal cell carcinoma. Currently on observation. EXAM: CT CHEST, ABDOMEN AND PELVIS WITHOUT CONTRAST TECHNIQUE: Multidetector CT imaging of the chest, abdomen and pelvis was performed following the standard protocol without IV contrast. COMPARISON:  02/01/2019 FINDINGS: CT CHEST FINDINGS Cardiovascular: Coronary, aortic arch, and branch vessel atherosclerotic vascular disease. Mild ascending aortic ectasia without overt aneurysm. Small stable anterior pericardial effusion. Mediastinum/Nodes: Stable calcified subcarinal lymph node. Lungs/Pleura: Continued occlusion of segmental branches of  the left upper lobe and superior segment left lower lobe tracheobronchial tree associated with the masslike density and volume loss surrounding the left pulmonary artery at the left hilum. Differentiation of the pulmonary artery from the atelectasis and potential mass in this vicinity is problematic. In general the contour is not changed from 02/01/2019 and there is only been low-grade activity in PET-CT on this region which may reflect treated tumor. Right infrahilar clustered calcified lymph nodes, unchanged. No new pulmonary nodule. Musculoskeletal: Old healed right rib fractures. Thoracic spondylosis. CT ABDOMEN PELVIS FINDINGS Hepatobiliary: Mildly contracted gallbladder. Otherwise unremarkable. Pancreas: Unremarkable Spleen: Unremarkable Adrenals/Urinary Tract: Stable 2.6 by 2.0 cm left adrenal mass, noncontrast density 22 Hounsfield units which is nonspecific. Right adrenal gland unremarkable. Left kidney is absent. Vascular calcifications along the right renal hilum. No urinary tract calculi are observed. Stomach/Bowel: Unremarkable Vascular/Lymphatic: Aortoiliac atherosclerotic vascular disease. No pathologic adenopathy is identified. Reproductive: Left orchectomy. Other: No supplemental non-categorized findings. Musculoskeletal: Ventral periumbilical hernia mesh noted. Fused sacroiliac joints. Degenerative bilateral hip arthropathy. Lower lumbar spondylosis and degenerative disc disease causing bilateral foraminal impingement at L4-5 likely mild right foraminal impingement at L5-S1. IMPRESSION: 1. Stable appearance of the left hilum with volume loss and occlusion of segmental branches of the left upper lobe and superior segment left lower lobe tracheobronchial tree. No interval to suggest change recurrent active malignancy in the lung. 2. The nonspecific left adrenal mass is currently stable in size, although had previously enlarged and probably merits careful surveillance. 3. Other imaging findings of  potential clinical significance: Coronary atherosclerosis.  Small stable anterior pericardial effusion. Old granulomatous disease. Left orchectomy. Lower lumbar spondylosis and degenerative disc disease causing bilateral foraminal impingement at L4-5 and likely mild right foraminal impingement at L5-S1. Fused sacroiliac joints. Degenerative bilateral hip arthropathy. Aortic Atherosclerosis (ICD10-I70.0). Electronically Signed   By: Van Clines M.D.   On: 04/01/2019 15:42    ASSESSMENT AND PLAN:  This is a very pleasant 69 years old white male with history of stage IIIa non-small cell lung cancer status post concurrent chemoradiation with weekly carboplatin and paclitaxel followed by observation since the patient was noted good candidate for consolidation chemotherapy at that time. He also has a history of renal cell carcinoma status post left radical nephrectomy. The patient has been in observation since January 2015. The patient has been on observation since that time and he is feeling fine with no concerning complaints. Repeat CT scan of the chest, abdomen pelvis performed recently showed enlarging left adrenal mass but the mass has low hypermetabolic activity on the previous PET scan in September 2020. The patient is currently on observation and he had repeat CT scan of the chest, abdomen pelvis performed recently.  I personally and independently reviewed the scans and discussed the results with the patient today.  The scan showed no concerning findings for disease progression and the lung or the adrenal glands it has been stable. I recommended for the patient to continue on observation with repeat CT scan of the chest, abdomen and pelvis in 6 months. He was advised to call immediately if he has any concerning symptoms in the interval. I strongly encouraged the patient to call immediately if he has any concerning chest pain or shortness of breath in the interval. Disclaimer: This note was  dictated with voice recognition software. Similar sounding words can inadvertently be transcribed and may not be corrected upon review.

## 2019-04-04 NOTE — Telephone Encounter (Signed)
Scheduled appt per 3/29 los. Pt declined AVS and stated he would refer to mychart.

## 2019-06-10 IMAGING — CT CT CHEST W/O CM
2 of 4 series · 15 of 36 positions shown, 18 images · non-contrast
Comparison: 12/24/2015

CLINICAL DATA: Six-month restaging one cancer CT scan.

EXAM:
CT CHEST WITHOUT CONTRAST
TECHNIQUE: Multidetector CT imaging of the chest was performed following the
standard protocol without IV contrast.

[Series 2: thorax · axial · 0.79mm/px · z∈[-100,+192]mm · 12 of 172 slices shown, 15 images]
[im 13/172  mediastinal]
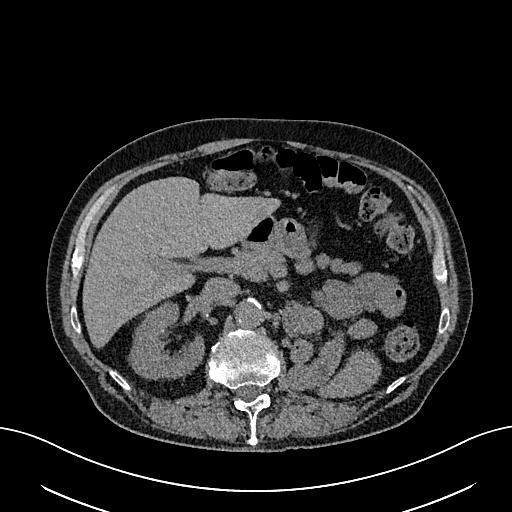
[im 13/172  lung]
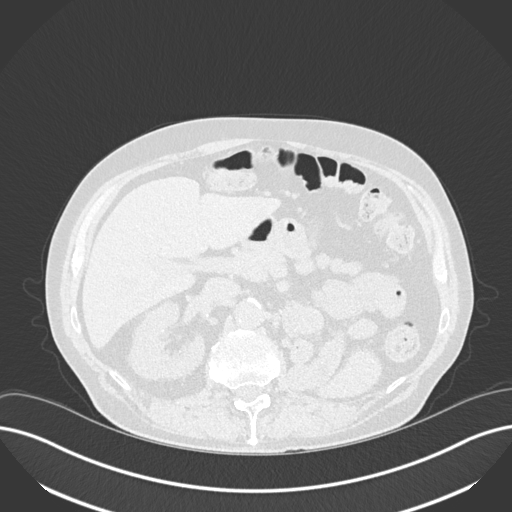
[im 25/172  lung]
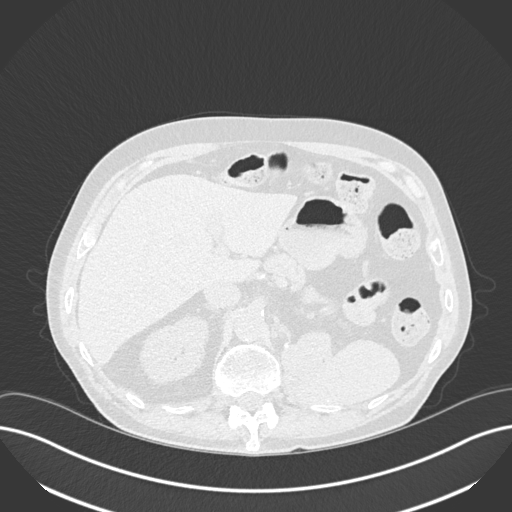
[im 37/172  lung]
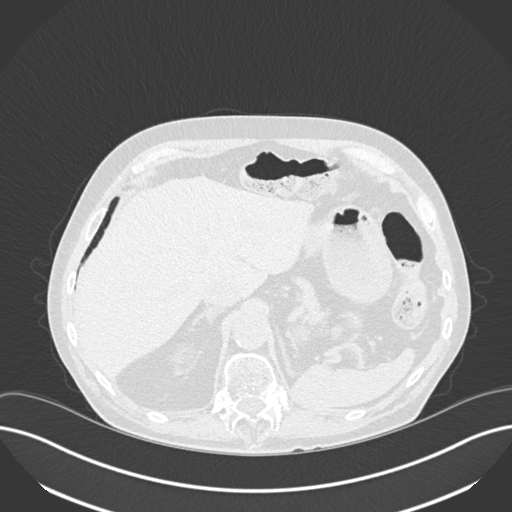
[im 49/172  lung]
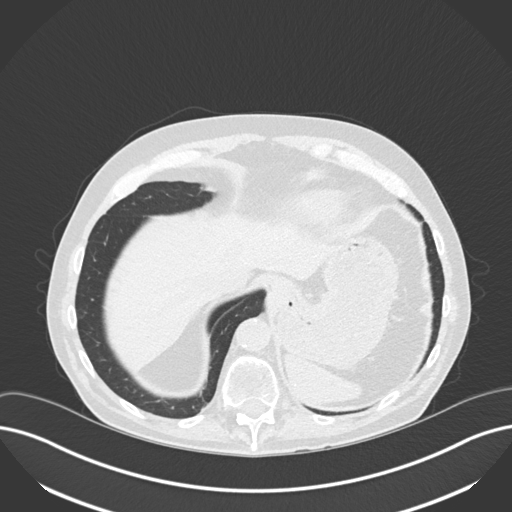
[im 62/172  mediastinal]
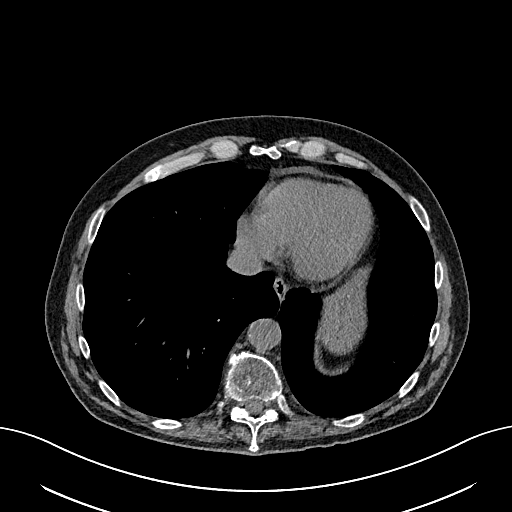
[im 62/172  lung]
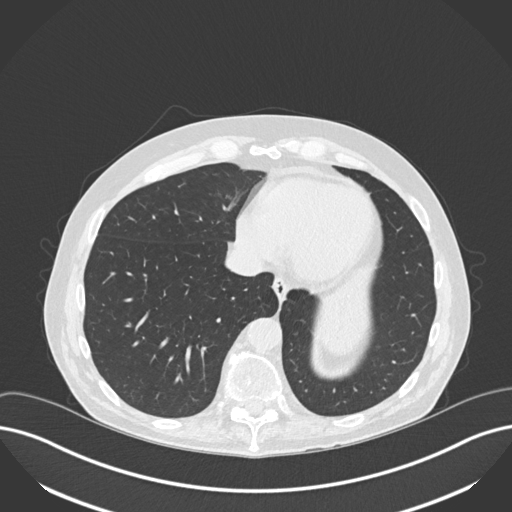
[im 74/172  lung]
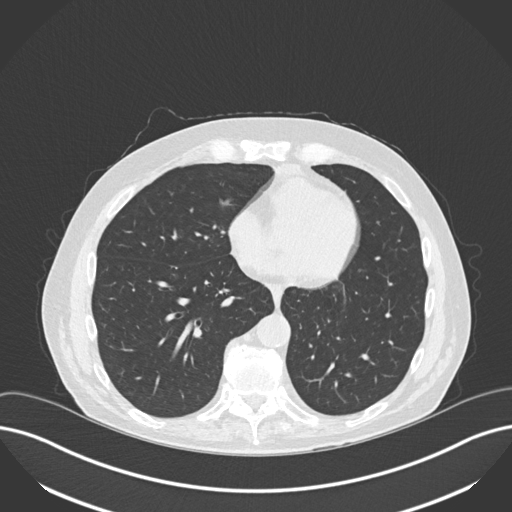
[im 98/172  lung]
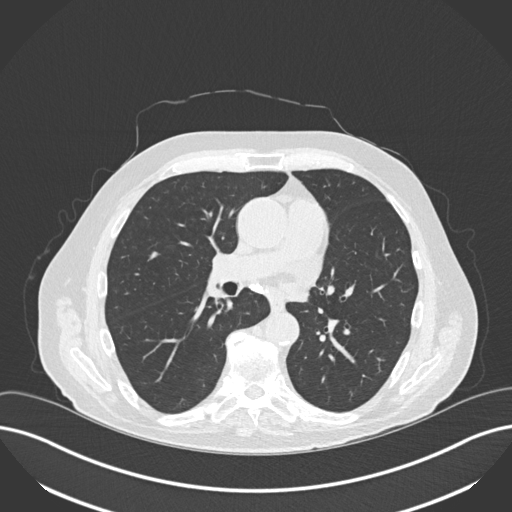
[im 110/172  lung]
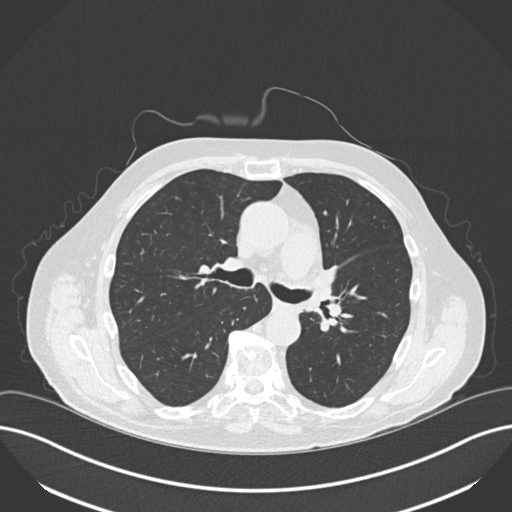
[im 123/172  mediastinal]
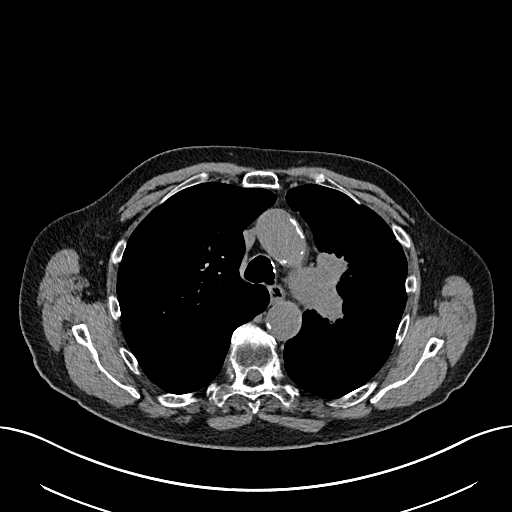
[im 123/172  lung]
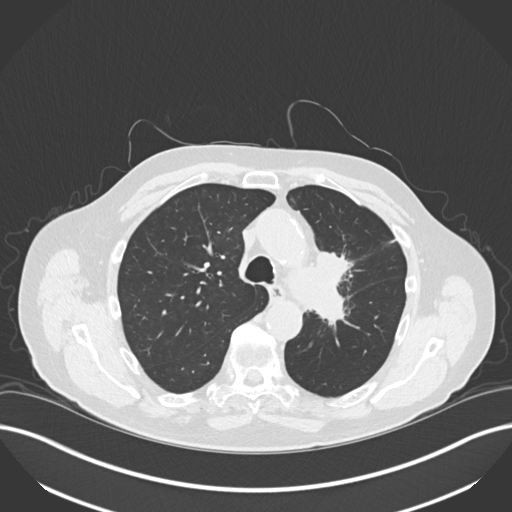
[im 135/172  lung]
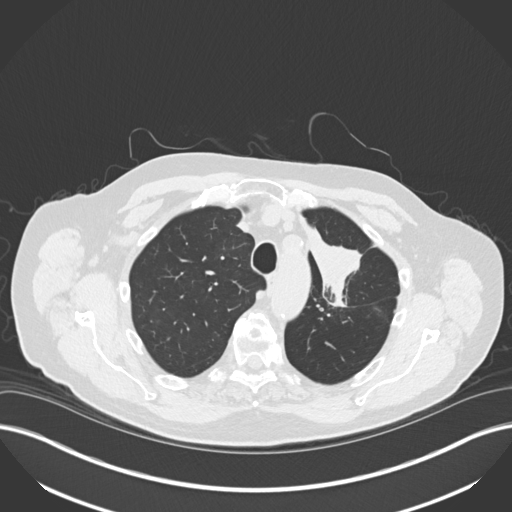
[im 147/172  lung]
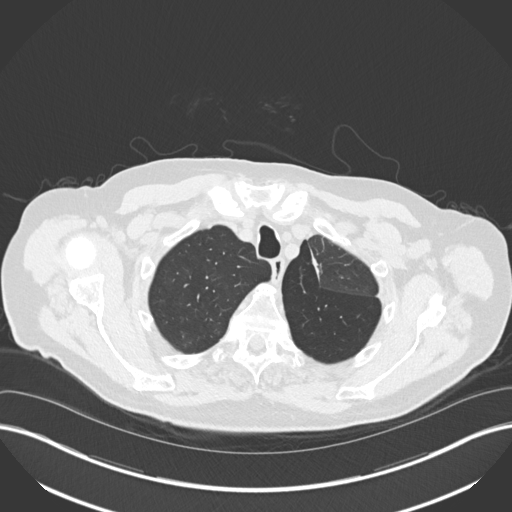
[im 159/172  lung]
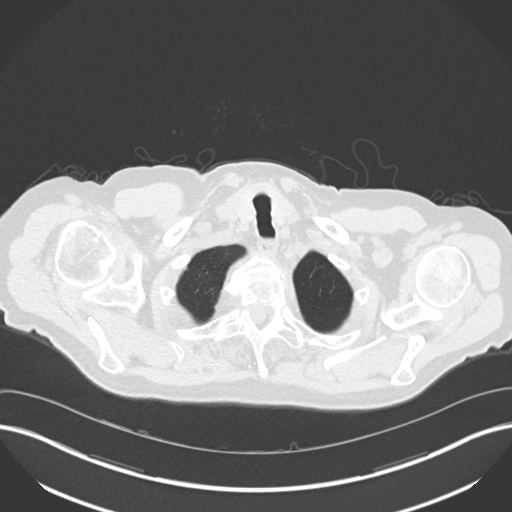

[Series 5: coronal · coronal · 0.74mm/px · 3 of 119 slices shown]
[im 24/119  lung]
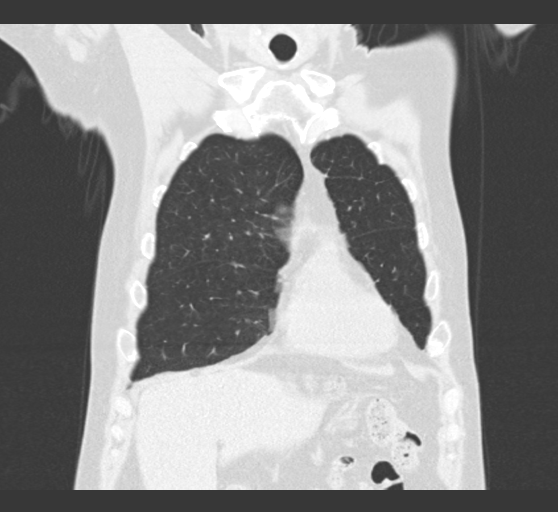
[im 48/119  lung]
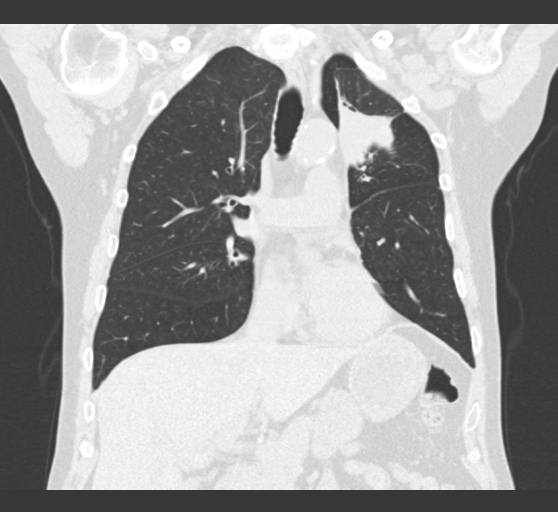
[im 71/119  lung]
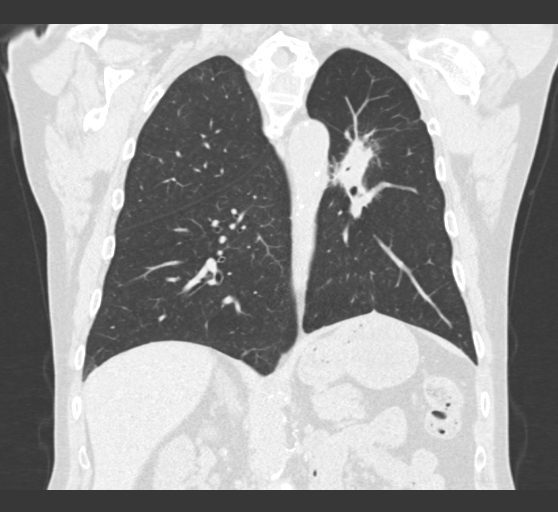

[15 of 36 positions shown; findings below may reference images not displayed]

FINDINGS: Cardiovascular: The heart is normal in size. No pericardial
effusion. Stable tortuosity, ectasia and calcification of the
thoracic aorta. Stable coronary artery calcifications.

Mediastinum/Nodes: No mediastinal lymphadenopathy. Stable calcified
subcarinal lymph nodes and right hilar lymph nodes. Soft tissue
density in the left hilar region appears relatively stable. This is
likely treated tumor and radiation fibrosis. Stable marked
irregularity of the left upper lobe and left lower lobe bronchi
likely areas of stenosis and scarring from the tumor and radiation.

The esophagus is grossly normal.

Lungs/Pleura: New confluent triangular soft tissue density in the
left upper lobe measuring 4.1 x 3.5 cm on image number 30. Previous
status with us more linear and ground-glass in appearance. It may be
progressive confluent radiation changes. Recurrent tumor could not
be excluded. Recommend PET-CT for further evaluation and to exclude
malignancy.

No worrisome pulmonary nodules to suggest pulmonary metastatic
disease. Stable underline emphysematous changes.

Upper Abdomen: No significant upper abdominal findings. Stable
surgical changes from a left nephrectomy. The adrenal glands are
unremarkable. No obvious hepatic metastatic disease.

Musculoskeletal: No significant bony findings. No evidence of
osseous metastatic disease. Stable degenerative changes and
osteoporosis.
IMPRESSION: 1. New confluent solid triangular soft tissue density in the left
upper lobe measuring 4.1 x 3.5 cm. Although this could be
confluence/coalescing radiation scarring changes, recurrent neoplasm
needs to be excluded. Recommend PET-CT for further evaluation.
2. No evidence of metastatic lymphadenopathy. Stable left hilar soft
tissue density, likely scar. Stable calcified lymph nodes.
3. No findings for upper abdominal metastatic disease or osseous
metastatic disease.
4. Stable emphysematous changes.

Aortic Atherosclerosis ([id number]-ZE0.0) and Emphysema (YD07G-HCC.7).

## 2019-10-03 ENCOUNTER — Ambulatory Visit (HOSPITAL_COMMUNITY)
Admission: RE | Admit: 2019-10-03 | Discharge: 2019-10-03 | Disposition: A | Discharge status: Home / Self Care | Payer: Medicare Other | Source: Ambulatory Visit | Attending: Internal Medicine | Admitting: Internal Medicine

## 2019-10-03 ENCOUNTER — Other Ambulatory Visit: Payer: Self-pay

## 2019-10-03 ENCOUNTER — Inpatient Hospital Stay: Payer: Medicare Other | Attending: Internal Medicine

## 2019-10-03 ENCOUNTER — Other Ambulatory Visit: Payer: Medicare Other

## 2019-10-03 DIAGNOSIS — Z85528 Personal history of other malignant neoplasm of kidney: Secondary | ICD-10-CM | POA: Diagnosis not present

## 2019-10-03 DIAGNOSIS — Z85118 Personal history of other malignant neoplasm of bronchus and lung: Secondary | ICD-10-CM | POA: Insufficient documentation

## 2019-10-03 DIAGNOSIS — C3490 Malignant neoplasm of unspecified part of unspecified bronchus or lung: Secondary | ICD-10-CM

## 2019-10-03 DIAGNOSIS — Z8547 Personal history of malignant neoplasm of testis: Secondary | ICD-10-CM | POA: Diagnosis not present

## 2019-10-03 DIAGNOSIS — Z905 Acquired absence of kidney: Secondary | ICD-10-CM | POA: Diagnosis not present

## 2019-10-03 DIAGNOSIS — Z9221 Personal history of antineoplastic chemotherapy: Secondary | ICD-10-CM | POA: Insufficient documentation

## 2019-10-03 LAB — CMP (CANCER CENTER ONLY)
ALT: 12 U/L (ref 0–44)
AST: 14 U/L — ABNORMAL LOW (ref 15–41)
Albumin: 3.3 g/dL — ABNORMAL LOW (ref 3.5–5.0)
Alkaline Phosphatase: 107 U/L (ref 38–126)
Anion gap: 4 — ABNORMAL LOW (ref 5–15)
BUN: 13 mg/dL (ref 8–23)
CO2: 31 mmol/L (ref 22–32)
Calcium: 9.8 mg/dL (ref 8.9–10.3)
Chloride: 101 mmol/L (ref 98–111)
Creatinine: 1.31 mg/dL — ABNORMAL HIGH (ref 0.61–1.24)
GFR, Est AFR Am: >60 mL/min (ref >60)
GFR, Estimated: 55 mL/min — ABNORMAL LOW (ref >60)
Glucose, Bld: 98 mg/dL (ref 70–99)
Potassium: 4.8 mmol/L (ref 3.5–5.1)
Sodium: 136 mmol/L (ref 135–145)
Total Bilirubin: 0.3 mg/dL (ref 0.3–1.2)
Total Protein: 7.2 g/dL (ref 6.5–8.1)

## 2019-10-03 LAB — CBC WITH DIFFERENTIAL (CANCER CENTER ONLY)
Abs Immature Granulocytes: 0.04 10*3/uL (ref 0.00–0.07)
Basophils Absolute: 0.1 10*3/uL (ref 0.0–0.1)
Basophils Relative: 1 %
Eosinophils Absolute: 0.1 10*3/uL (ref 0.0–0.5)
Eosinophils Relative: 2 %
HCT: 45.8 % (ref 39.0–52.0)
Hemoglobin: 14.4 g/dL (ref 13.0–17.0)
Immature Granulocytes: 0 %
Lymphocytes Relative: 15 %
Lymphs Abs: 1.4 10*3/uL (ref 0.7–4.0)
MCH: 27.9 pg (ref 26.0–34.0)
MCHC: 31.4 g/dL (ref 30.0–36.0)
MCV: 88.6 fL (ref 80.0–100.0)
Monocytes Absolute: 0.6 10*3/uL (ref 0.1–1.0)
Monocytes Relative: 7 %
Neutro Abs: 6.8 10*3/uL (ref 1.7–7.7)
Neutrophils Relative %: 75 %
Platelet Count: 331 10*3/uL (ref 150–400)
RBC: 5.17 MIL/uL (ref 4.22–5.81)
RDW: 13.5 % (ref 11.5–15.5)
WBC Count: 9 10*3/uL (ref 4.0–10.5)
nRBC: 0 % (ref 0.0–0.2)

## 2019-10-03 MED ORDER — IOHEXOL 9 MG/ML PO SOLN
ORAL | Status: AC
  Filled 2019-10-03: qty 1000

## 2019-10-03 MED ORDER — IOHEXOL 9 MG/ML PO SOLN
500.0000 mL | ORAL | Status: AC
  Administered 2019-10-03: 500 mL via ORAL

## 2019-10-04 ENCOUNTER — Inpatient Hospital Stay: Payer: Medicare Other | Admitting: Internal Medicine

## 2019-10-05 ENCOUNTER — Telehealth: Payer: Self-pay | Admitting: Physician Assistant

## 2019-10-05 ENCOUNTER — Other Ambulatory Visit: Payer: Self-pay

## 2019-10-05 ENCOUNTER — Inpatient Hospital Stay (HOSPITAL_BASED_OUTPATIENT_CLINIC_OR_DEPARTMENT_OTHER): Payer: Medicare Other | Admitting: Internal Medicine

## 2019-10-05 ENCOUNTER — Encounter: Payer: Self-pay | Admitting: Internal Medicine

## 2019-10-05 VITALS — BP 118/77 | HR 86 | Temp 98.1°F | Resp 18 | Ht 67.0 in | Wt 170.1 lb

## 2019-10-05 DIAGNOSIS — C3492 Malignant neoplasm of unspecified part of left bronchus or lung: Secondary | ICD-10-CM | POA: Diagnosis not present

## 2019-10-05 DIAGNOSIS — Z85118 Personal history of other malignant neoplasm of bronchus and lung: Secondary | ICD-10-CM | POA: Diagnosis not present

## 2019-10-05 DIAGNOSIS — C642 Malignant neoplasm of left kidney, except renal pelvis: Secondary | ICD-10-CM | POA: Diagnosis not present

## 2019-10-05 NOTE — Telephone Encounter (Signed)
I called the patient and let him know that Dr. Julien Nordmann will not be in the office at 3 PM today and asked if he can come in at 1:30 this afternoon instead. He seemed reluctant but agreed to come in. I have already sent a scheduling message to scheduling.

## 2019-10-05 NOTE — Progress Notes (Signed)
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 2) Status post left laparoscopic radical nephrectomy under the care of Dr. Louis Meckel on 04/04/2013.  CURRENT THERAPY: Observation.  INTERVAL HISTORY: [name] 69 y.o. male returns to the clinic today for follow-up visit.  The patient is feeling fine with no concerning complaints.  He denied having any chest pain, shortness of breath, cough or hemoptysis.  He denied having any nausea, vomiting, diarrhea or constipation.  He has no headache or visual changes.  The patient had repeat CT scan of the chest, abdomen pelvis performed recently and is here for evaluation and discussion of his discuss results.  MEDICAL HISTORY: Past Medical History:  Diagnosis Date  . Arthritis   . Cancer of kidney ([place])    left  . Complication of anesthesia    Very little movement of neck- "self fusioning"  . Crohn's disease ([facility])    no issues since 2012  . Difficulty sleeping    occasionally  . Dyspnea    with a little exertion,  "smoked cigarettes all my life"  . History of blood transfusion    "chron's flare"  . Lung cancer ([place])   . Renal cell carcinoma of left kidney ([place]) 02/20/2015  . Renal mass   . Renal mass, left   . Sleep apnea 05/2016  . Testicular cancer ([place]) 2000   s/p resection, no recurrence    ALLERGIES:  has No Known Allergies.  MEDICATIONS:  Current  Outpatient Medications  Medication Sig Dispense Refill  . Astaxanthin 4 MG CAPS Take by mouth.    . hydrOXYzine (ATARAX/VISTARIL) 50 MG tablet Take 12.5 mg by mouth at bedtime as needed (for sleep.).    Marland Kitchen OVER THE COUNTER MEDICATION Take 1 tablet by mouth daily.    . Oxycodone HCl 10 MG TABS Take 10 mg by mouth 5 (five) times daily.     . Probiotic Product (PROBIOTIC DAILY PO) Take 1 capsule by mouth daily.    . TURMERIC CURCUMIN PO Take by mouth.    . Vitamin D-Vitamin K (D3 + K2 DOTS PO) Take 1 tablet by mouth at bedtime.     No current facility-administered medications for this visit.    SURGICAL HISTORY:  Past Surgical History:  Procedure Laterality Date  . APPENDECTOMY    . COLONOSCOPY    . HERNIA REPAIR     x 2  . LAPAROSCOPIC NEPHRECTOMY Left 04/04/2013   Procedure: LAPAROSCOPIC LEFT RADICAL NEPHRECTOMY ;  Surgeon: [name], MD;  Location: [facility];  Service: Urology;  Laterality: Left;  . LUNG BIOPSY Left 08/08/2016   Procedure: LEFT LUNG BIOPSY;  Surgeon: Grace Isaac, MD;  Location: [facility];  Service: Thoracic;  Laterality: Left;  . testicular removal  2000  . VIDEO BRONCHOSCOPY N/A 11/30/2012   Procedure: VIDEO BRONCHOSCOPY WITH FLUORO;  Surgeon: Elsie Stain, MD;  Location: [facility];  Service: Cardiopulmonary;  Laterality: N/A;  .  VIDEO BRONCHOSCOPY N/A 08/08/2016   Procedure: VIDEO BRONCHOSCOPY;  Surgeon: Grace Isaac, MD;  Location: [facility];  Service: Thoracic;  Laterality: N/A;    REVIEW OF SYSTEMS:  A comprehensive review of systems was negative except for: Respiratory: positive for dyspnea on exertion   PHYSICAL EXAMINATION: General appearance: alert, cooperative and no distress Head: Normocephalic, without obvious abnormality, atraumatic Neck: no adenopathy, no JVD, supple, symmetrical, trachea midline and thyroid not enlarged, symmetric, no tenderness/mass/nodules Lymph nodes: Cervical, supraclavicular, and axillary nodes normal. Resp: clear  to auscultation bilaterally Back: symmetric, no curvature. ROM normal. No CVA tenderness. Cardio: regular rate and rhythm, S1, S2 normal, no murmur, click, rub or gallop GI: soft, non-tender; bowel sounds normal; no masses,  no organomegaly Extremities: extremities normal, atraumatic, no cyanosis or edema  ECOG PERFORMANCE STATUS: 1 - Symptomatic but completely ambulatory  Blood pressure 118/77, pulse 86, temperature 98.1 F (36.7 C), temperature source Tympanic, resp. rate 18, height 5' 7"  (1.702 m), weight 170 lb 2 oz (77.2 kg), SpO2 98 %.  LABORATORY DATA: Lab Results  Component Value Date   WBC 9.0 10/03/2019   HGB 14.4 10/03/2019   HCT 45.8 10/03/2019   MCV 88.6 10/03/2019   PLT 331 10/03/2019      Chemistry      Component Value Date/Time   NA 136 10/03/2019 1252   NA 142 06/30/2016 1406   K 4.8 10/03/2019 1252   K 4.8 06/30/2016 1406   CL 101 10/03/2019 1252   CO2 31 10/03/2019 1252   CO2 29 06/30/2016 1406   BUN 13 10/03/2019 1252   BUN 14.8 06/30/2016 1406   CREATININE 1.31 (H) 10/03/2019 1252   CREATININE 1.4 (H) 06/30/2016 1406      Component Value Date/Time   CALCIUM 9.8 10/03/2019 1252   CALCIUM 9.8 06/30/2016 1406   ALKPHOS 107 10/03/2019 1252   ALKPHOS 122 06/30/2016 1406   AST 14 (L) 10/03/2019 1252   AST 11 06/30/2016 1406   ALT 12 10/03/2019 1252   ALT 13 06/30/2016 1406   BILITOT 0.3 10/03/2019 1252   BILITOT 0.40 06/30/2016 1406       RADIOGRAPHIC STUDIES: CT Abdomen Pelvis Wo Contrast  Result Date: 10/04/2019 CLINICAL DATA:  Non-small-cell lung cancer.  Restaging. EXAM: CT CHEST, ABDOMEN AND PELVIS WITHOUT CONTRAST TECHNIQUE: Multidetector CT imaging of the chest, abdomen and pelvis was performed following the standard protocol without IV contrast. COMPARISON:  04/01/2019 FINDINGS: CT CHEST FINDINGS Cardiovascular: The heart size is normal. No substantial pericardial effusion. Coronary artery calcification is evident. Atherosclerotic  calcification is noted in the wall of the thoracic aorta. Mediastinum/Nodes: Calcified nodal tissue again noted in the subcarinal station and right hilum. No mediastinal lymphadenopathy. No evidence for gross hilar lymphadenopathy although assessment is limited by the lack of intravenous contrast on today's study. The esophagus has normal imaging features. There is no axillary lymphadenopathy. Lungs/Pleura: Centrilobular emphsyema noted. Volume loss left hemithorax with dense consolidative opacity in the left parahilar and suprahilar lung is stable in the interval, compatible with post treatment scarring. No new or progressive findings to suggest local recurrence. Patient does have a new 7 mm nodule in the posterior right lower lobe (74/4) that is associated with a impacted airway suggesting infectious/inflammatory etiology no additional new suspicious nodule or mass. No pleural effusion. Musculoskeletal: No worrisome lytic or sclerotic osseous abnormality. CT ABDOMEN PELVIS FINDINGS Hepatobiliary: No focal abnormality in the liver on this study without intravenous contrast. There is no evidence for gallstones, gallbladder  wall thickening, or pericholecystic fluid. No intrahepatic or extrahepatic biliary dilation. Pancreas: No focal mass lesion. No dilatation of the main duct. No intraparenchymal cyst. No peripancreatic edema. Spleen: No splenomegaly. No focal mass lesion. Adrenals/Urinary Tract: Stable mild thickening right adrenal gland. The left adrenal nodule has progressed in the interval measuring 3.2 x 2.5 cm today compared to 2.6 x 2.0 cm previously. Right ureter unremarkable. The urinary bladder appears normal for the degree of distention. Stomach/Bowel: Stomach is unremarkable. No gastric wall thickening. No evidence of outlet obstruction. Duodenum is normally positioned as is the ligament of Treitz. No small bowel wall thickening. No small bowel dilatation. The terminal ileum is normal. The appendix is  not visualized, but there is no edema or inflammation in the region of the cecum. No gross colonic mass. No colonic wall thickening. Vascular/Lymphatic: There is abdominal aortic atherosclerosis without aneurysm. There is no gastrohepatic or hepatoduodenal ligament lymphadenopathy. No retroperitoneal or mesenteric lymphadenopathy. No pelvic sidewall lymphadenopathy. Reproductive: The prostate gland and seminal vesicles are unremarkable. Other: No intraperitoneal free fluid. Musculoskeletal: No worrisome lytic or sclerotic osseous abnormality. IMPRESSION: 1. Stable appearance of post treatment scarring in the left parahilar and suprahilar lung. No new or progressive findings to suggest local recurrence. 2. New 7 mm nodule posterior right lower lobe associated with a impacted airway suggesting infectious/inflammatory etiology. Although infectious/inflammatory etiology is favored, close follow-up required and repeat CT chest in 3 months recommended. 3. Interval progression of the left adrenal nodule, now measuring 3.2 x 2.5 cm compared to 2.6 x 2.0 cm previously. This lesion measured 1.6 cm on PET-CT of 10/05/2018 and showed only low level FDG uptake at that time. Given continued progression, close follow-up recommended. Repeat PET-CT may be warranted. 4. Aortic Atherosclerosis (ICD10-I70.0) and Emphysema (ICD10-J43.9). Electronically Signed   By: Misty Stanley M.D.   On: 10/04/2019 09:58   CT Chest Wo Contrast  Result Date: 10/04/2019 CLINICAL DATA:  Non-small-cell lung cancer.  Restaging. EXAM: CT CHEST, ABDOMEN AND PELVIS WITHOUT CONTRAST TECHNIQUE: Multidetector CT imaging of the chest, abdomen and pelvis was performed following the standard protocol without IV contrast. COMPARISON:  04/01/2019 FINDINGS: CT CHEST FINDINGS Cardiovascular: The heart size is normal. No substantial pericardial effusion. Coronary artery calcification is evident. Atherosclerotic calcification is noted in the wall of the thoracic  aorta. Mediastinum/Nodes: Calcified nodal tissue again noted in the subcarinal station and right hilum. No mediastinal lymphadenopathy. No evidence for gross hilar lymphadenopathy although assessment is limited by the lack of intravenous contrast on today's study. The esophagus has normal imaging features. There is no axillary lymphadenopathy. Lungs/Pleura: Centrilobular emphsyema noted. Volume loss left hemithorax with dense consolidative opacity in the left parahilar and suprahilar lung is stable in the interval, compatible with post treatment scarring. No new or progressive findings to suggest local recurrence. Patient does have a new 7 mm nodule in the posterior right lower lobe (74/4) that is associated with a impacted airway suggesting infectious/inflammatory etiology no additional new suspicious nodule or mass. No pleural effusion. Musculoskeletal: No worrisome lytic or sclerotic osseous abnormality. CT ABDOMEN PELVIS FINDINGS Hepatobiliary: No focal abnormality in the liver on this study without intravenous contrast. There is no evidence for gallstones, gallbladder wall thickening, or pericholecystic fluid. No intrahepatic or extrahepatic biliary dilation. Pancreas: No focal mass lesion. No dilatation of the main duct. No intraparenchymal cyst. No peripancreatic edema. Spleen: No splenomegaly. No focal mass lesion. Adrenals/Urinary Tract: Stable mild thickening right adrenal gland. The left adrenal nodule has progressed in the interval  measuring 3.2 x 2.5 cm today compared to 2.6 x 2.0 cm previously. Right ureter unremarkable. The urinary bladder appears normal for the degree of distention. Stomach/Bowel: Stomach is unremarkable. No gastric wall thickening. No evidence of outlet obstruction. Duodenum is normally positioned as is the ligament of Treitz. No small bowel wall thickening. No small bowel dilatation. The terminal ileum is normal. The appendix is not visualized, but there is no edema or inflammation  in the region of the cecum. No gross colonic mass. No colonic wall thickening. Vascular/Lymphatic: There is abdominal aortic atherosclerosis without aneurysm. There is no gastrohepatic or hepatoduodenal ligament lymphadenopathy. No retroperitoneal or mesenteric lymphadenopathy. No pelvic sidewall lymphadenopathy. Reproductive: The prostate gland and seminal vesicles are unremarkable. Other: No intraperitoneal free fluid. Musculoskeletal: No worrisome lytic or sclerotic osseous abnormality. IMPRESSION: 1. Stable appearance of post treatment scarring in the left parahilar and suprahilar lung. No new or progressive findings to suggest local recurrence. 2. New 7 mm nodule posterior right lower lobe associated with a impacted airway suggesting infectious/inflammatory etiology. Although infectious/inflammatory etiology is favored, close follow-up required and repeat CT chest in 3 months recommended. 3. Interval progression of the left adrenal nodule, now measuring 3.2 x 2.5 cm compared to 2.6 x 2.0 cm previously. This lesion measured 1.6 cm on PET-CT of 10/05/2018 and showed only low level FDG uptake at that time. Given continued progression, close follow-up recommended. Repeat PET-CT may be warranted. 4. Aortic Atherosclerosis (ICD10-I70.0) and Emphysema (ICD10-J43.9). Electronically Signed   By: Misty Stanley M.D.   On: 10/04/2019 09:58    ASSESSMENT AND PLAN:  This is a very pleasant 69 years old white male with history of stage IIIa non-small cell lung cancer status post concurrent chemoradiation with weekly carboplatin and paclitaxel followed by observation since the patient was noted good candidate for consolidation chemotherapy at that time. He also has a history of renal cell carcinoma status post left radical nephrectomy. The patient has been in observation since January 2015. The patient has been on observation since that time and he is feeling fine with no concerning complaints. Repeat CT scan of the  chest, abdomen pelvis performed recently showed enlarging left adrenal mass but the mass has low hypermetabolic activity on the previous PET scan in September 2020. The patient is currently on observation and he is feeling fine. He had repeat CT scan of the chest, abdomen pelvis performed recently.  I personally and independently reviewed the scan images and discussed the results with the patient today. His scan showed no concerning findings for disease recurrence or progression in the lung except for new 7 mm nodule in the posterior right lower lobe associated with impacted airway suggesting infection/inflammatory etiology but he has further enlargement of the left adrenal nodule suspicious for disease metastasis. I recommended for the patient to have CT-guided core biopsy of the enlarging left adrenal nodule to rule out metastatic disease. I will see the patient back for follow-up visit in 3 weeks for evaluation and discussion of the biopsy results and further recommendation regarding treatment of his condition. He was advised to call immediately if he has any concerning symptoms in the interval.  I strongly encouraged the patient to call immediately if he has any concerning chest pain or shortness of breath in the interval. Disclaimer: This note was dictated with voice recognition software. Similar sounding words can inadvertently be transcribed and may not be corrected upon review.

## 2019-10-10 ENCOUNTER — Encounter (HOSPITAL_COMMUNITY): Payer: Self-pay

## 2019-10-10 NOTE — Progress Notes (Signed)
Hazle Nordmann [facility] Male, 70 y.o., 1950/03/05 MRN:  [id number] Phone:  [phone] (H) PCP:  Dineen Kid, MD Coverage:  [place] With Radiology ([facility]) 10/31/2019 at 11:00 AM  RE: Biopsy Received: 4 days ago Message Details  Corrie Mckusick, DO  [facility] for CT guided left adrenal gland mass biopsy.   Enlarged since last PET, suspicious for met.    Best for [employer] day.  Earleen Newport   Previous Messages  ----- Message -----  From: Lenore Cordia  Sent: 10/06/2019  4:19 PM EDT  To: [facility] Procedure Requests  Subject: Biopsy                      Procedure Requested: CT-guided core biopsy  Reason for Procedure: of the enlarging left adrenal gland lesion    Provider Requesting: Dr Curt Bears  Provider Telephone: [phone]    Other Info: Rad exam in Epic

## 2019-10-11 ENCOUNTER — Telehealth: Payer: Self-pay | Admitting: Internal Medicine

## 2019-10-11 NOTE — Telephone Encounter (Signed)
Scheduled per los. Called and spoke with patient. Confirmed appt 

## 2019-10-25 ENCOUNTER — Ambulatory Visit (HOSPITAL_COMMUNITY): Payer: Medicare Other

## 2019-10-27 ENCOUNTER — Other Ambulatory Visit: Payer: Self-pay | Admitting: Radiology

## 2019-10-28 ENCOUNTER — Other Ambulatory Visit: Payer: Self-pay | Admitting: Radiology

## 2019-10-31 ENCOUNTER — Other Ambulatory Visit: Payer: Self-pay

## 2019-10-31 ENCOUNTER — Ambulatory Visit (HOSPITAL_COMMUNITY)
Admission: RE | Admit: 2019-10-31 | Discharge: 2019-10-31 | Disposition: A | Discharge status: Home / Self Care | Payer: Medicare Other | Source: Ambulatory Visit | Attending: Internal Medicine | Admitting: Internal Medicine

## 2019-10-31 ENCOUNTER — Encounter (HOSPITAL_COMMUNITY): Payer: Self-pay

## 2019-10-31 DIAGNOSIS — Z8547 Personal history of malignant neoplasm of testis: Secondary | ICD-10-CM | POA: Diagnosis not present

## 2019-10-31 DIAGNOSIS — F17210 Nicotine dependence, cigarettes, uncomplicated: Secondary | ICD-10-CM | POA: Insufficient documentation

## 2019-10-31 DIAGNOSIS — E279 Disorder of adrenal gland, unspecified: Secondary | ICD-10-CM | POA: Insufficient documentation

## 2019-10-31 DIAGNOSIS — Z79899 Other long term (current) drug therapy: Secondary | ICD-10-CM | POA: Insufficient documentation

## 2019-10-31 DIAGNOSIS — C642 Malignant neoplasm of left kidney, except renal pelvis: Secondary | ICD-10-CM | POA: Insufficient documentation

## 2019-10-31 DIAGNOSIS — C3492 Malignant neoplasm of unspecified part of left bronchus or lung: Secondary | ICD-10-CM | POA: Insufficient documentation

## 2019-10-31 DIAGNOSIS — Z85118 Personal history of other malignant neoplasm of bronchus and lung: Secondary | ICD-10-CM | POA: Diagnosis not present

## 2019-10-31 DIAGNOSIS — Z85528 Personal history of other malignant neoplasm of kidney: Secondary | ICD-10-CM | POA: Insufficient documentation

## 2019-10-31 LAB — BASIC METABOLIC PANEL
Anion gap: 8 (ref 5–15)
BUN: 16 mg/dL (ref 8–23)
CO2: 26 mmol/L (ref 22–32)
Calcium: 9.3 mg/dL (ref 8.9–10.3)
Chloride: 105 mmol/L (ref 98–111)
Creatinine, Ser: 1.27 mg/dL — ABNORMAL HIGH (ref 0.61–1.24)
GFR, Estimated: >60 mL/min (ref >60)
Glucose, Bld: 98 mg/dL (ref 70–99)
Potassium: 4.5 mmol/L (ref 3.5–5.1)
Sodium: 139 mmol/L (ref 135–145)

## 2019-10-31 LAB — CBC WITH DIFFERENTIAL/PLATELET
Abs Immature Granulocytes: 0.04 10*3/uL (ref 0.00–0.07)
Basophils Absolute: 0.1 10*3/uL (ref 0.0–0.1)
Basophils Relative: 1 %
Eosinophils Absolute: 0.3 10*3/uL (ref 0.0–0.5)
Eosinophils Relative: 4 %
HCT: 46 % (ref 39.0–52.0)
Hemoglobin: 14.4 g/dL (ref 13.0–17.0)
Immature Granulocytes: 1 %
Lymphocytes Relative: 15 %
Lymphs Abs: 1.3 10*3/uL (ref 0.7–4.0)
MCH: 28.8 pg (ref 26.0–34.0)
MCHC: 31.3 g/dL (ref 30.0–36.0)
MCV: 92 fL (ref 80.0–100.0)
Monocytes Absolute: 0.6 10*3/uL (ref 0.1–1.0)
Monocytes Relative: 7 %
Neutro Abs: 6.4 10*3/uL (ref 1.7–7.7)
Neutrophils Relative %: 72 %
Platelets: 309 10*3/uL (ref 150–400)
RBC: 5 MIL/uL (ref 4.22–5.81)
RDW: 14 % (ref 11.5–15.5)
WBC: 8.7 10*3/uL (ref 4.0–10.5)
nRBC: 0 % (ref 0.0–0.2)

## 2019-10-31 LAB — PROTIME-INR
INR: 1 (ref 0.8–1.2)
Prothrombin Time: 13 seconds (ref 11.4–15.2)

## 2019-10-31 MED ORDER — FENTANYL CITRATE (PF) 100 MCG/2ML IJ SOLN
INTRAMUSCULAR | Status: AC | PRN
  Administered 2019-10-31 (×2): 50 ug via INTRAVENOUS

## 2019-10-31 MED ORDER — LIDOCAINE HCL (PF) 1 % IJ SOLN
INTRAMUSCULAR | Status: AC | PRN
  Administered 2019-10-31: 10 mL

## 2019-10-31 MED ORDER — MIDAZOLAM HCL 2 MG/2ML IJ SOLN
INTRAMUSCULAR | Status: AC
  Filled 2019-10-31: qty 4

## 2019-10-31 MED ORDER — HYDROCODONE-ACETAMINOPHEN 5-325 MG PO TABS: 1.0000 | ORAL_TABLET | ORAL | Status: DC | PRN

## 2019-10-31 MED ORDER — FENTANYL CITRATE (PF) 100 MCG/2ML IJ SOLN
INTRAMUSCULAR | Status: AC
  Filled 2019-10-31: qty 2

## 2019-10-31 MED ORDER — MIDAZOLAM HCL 2 MG/2ML IJ SOLN
INTRAMUSCULAR | Status: AC | PRN
  Administered 2019-10-31: 1 mg via INTRAVENOUS

## 2019-10-31 MED ORDER — SODIUM CHLORIDE 0.9 % IV SOLN: INTRAVENOUS | Status: DC

## 2019-10-31 NOTE — Discharge Instructions (Signed)
DO NOT SHOWER X 24 HRS  FOR ANY QUESTIONS OR CONCERNS CALL [phone]    Needle Biopsy, Care After These instructions tell you how to care for yourself after your procedure. Your doctor may also give you more specific instructions. Call your doctor if you have any problems or questions. What can I expect after the procedure? After the procedure, it is common to have:  Soreness.  Bruising.  Mild pain. Follow these instructions at home:   Return to your normal activities as told by your doctor. Ask your doctor what activities are safe for you.  Take over-the-counter and prescription medicines only as told by your doctor.  Wash your hands with soap and water before you change your bandage (dressing). If you cannot use soap and water, use hand sanitizer.  Follow instructions from your doctor about: ? How to take care of your puncture site. ? When and how to change your bandage. ? When to remove your bandage.  Check your puncture site every day for signs of infection. Watch for: ? Redness, swelling, or pain. ? Fluid or blood. ? Pus or a bad smell. ? Warmth.  Do not take baths, swim, or use a hot tub until your doctor approves. Ask your doctor if you may take showers. You may only be allowed to take sponge baths.  Keep all follow-up visits as told by your doctor. This is important. Contact a doctor if you have:  A fever.  Redness, swelling, or pain at the puncture site, and it lasts longer than a few days.  Fluid, blood, or pus coming from the puncture site.  Warmth coming from the puncture site. Get help right away if:  You have a lot of bleeding from the puncture site. Summary  After the procedure, it is common to have soreness, bruising, or mild pain at the puncture site.  Check your puncture site every day for signs of infection, such as redness, swelling, or pain.  Get help right away if you have severe bleeding from your puncture site. This information is  not intended to replace advice given to you by your health care provider. Make sure you discuss any questions you have with your health care provider. Document Revised: 01/05/2017 Document Reviewed: 01/05/2017 Elsevier Patient Education  [place]. Moderate Conscious Sedation, Adult, Care After These instructions provide you with information about caring for yourself after your procedure. Your health care provider may also give you more specific instructions. Your treatment has been planned according to current medical practices, but problems sometimes occur. Call your health care provider if you have any problems or questions after your procedure. What can I expect after the procedure? After your procedure, it is common:  To feel sleepy for several hours.  To feel clumsy and have poor balance for several hours.  To have poor judgment for several hours.  To vomit if you eat too soon. Follow these instructions at home: For at least 24 hours after the procedure:   Do not: ? Participate in activities where you could fall or become injured. ? Drive. ? Use heavy machinery. ? Drink alcohol. ? Take sleeping pills or medicines that cause drowsiness. ? Make important decisions or sign legal documents. ? Take care of children on your own.  Rest. Eating and drinking  Follow the diet recommended by your health care provider.  If you vomit: ? Drink water, juice, or soup when you can drink without vomiting. ? Make sure you have little or no  nausea before eating solid foods. General instructions  Have a responsible adult stay with you until you are awake and alert.  Take over-the-counter and prescription medicines only as told by your health care provider.  If you smoke, do not smoke without supervision.  Keep all follow-up visits as told by your health care provider. This is important. Contact a health care provider if:  You keep feeling nauseous or you keep vomiting.  You  feel light-headed.  You develop a rash.  You have a fever. Get help right away if:  You have trouble breathing. This information is not intended to replace advice given to you by your health care provider. Make sure you discuss any questions you have with your health care provider. Document Revised: 12/05/2016 Document Reviewed: 04/14/2015 Elsevier Patient Education  2020 [employer].

## 2019-10-31 NOTE — Procedures (Signed)
Interventional Radiology Procedure Note  Procedure: CT guided left adrenal mass biopsy  Findings: Please refer to procedural dictation for full description. 18 ga core x4  Complications: None immediate  Estimated Blood Loss: <5 mL  Recommendations: Bedrest 3 hours. Follow up Pathology results.   Ruthann Cancer, MD

## 2019-10-31 NOTE — Progress Notes (Signed)
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 2) Status post left laparoscopic radical nephrectomy under the care of Dr. Louis Meckel on 04/04/2013.  CURRENT THERAPY: Observation  INTERVAL HISTORY: [name] 69 y.o. male returns to the clinic today for a follow up visit. The patient is feeling fairly well today without any concerning complaints. The patient has been on observation since 2015. He recently had a restaging CT scan which showed new 7 mm nodule in the posterior right lower lobe associated with impacted airway suggesting infection/inflammatory etiology but he has further enlargement of the left adrenal nodule suspicious for disease metastasis. Dr. Julien Nordmann recommended that he have a CT guided core biopsy of the enlarging left adrenal nodule to rule out metastatic disease. He had this procedure on 10/21/19. He tolerated it well except for some soreness in the area.  Otherwise, he denies any concerning complaints. Denies fevers, chills, night sweats, or weight loss. Denies chest pain, shortness of breath, cough, or hemoptyosis. Denies nausea, vomiting, diarrhea, or constipation. Denies headaches or visual changes. Denies any hematuria. Denies rashes or skin changes. He is here to review the pathology results from his recent biopsy.   MEDICAL HISTORY: Past Medical History:  Diagnosis Date  . Arthritis   . Cancer of kidney ([facility])    left  . Complication of  anesthesia    Very little movement of neck- "self fusioning"  . Crohn's disease ([facility])    no issues since 2012  . Difficulty sleeping    occasionally  . Dyspnea    with a little exertion,  "smoked cigarettes all my life"  . History of blood transfusion    "chron's flare"  . Lung cancer ([facility])   . Renal cell carcinoma of left kidney ([facility]) 02/20/2015  . Renal mass   . Renal mass, left   . Sleep apnea 05/2016  . Testicular cancer ([facility]) 2000   s/p resection, no recurrence    ALLERGIES:  is allergic to ativan [lorazepam].  MEDICATIONS:  Current Outpatient Medications  Medication Sig Dispense Refill  . Astaxanthin 4 MG CAPS Take by mouth.    . hydrOXYzine (ATARAX/VISTARIL) 50 MG tablet Take 12.5 mg by mouth at bedtime as needed (for sleep.).    Marland Kitchen OVER THE COUNTER MEDICATION Take 1 tablet by mouth daily.    . Oxycodone HCl 10 MG TABS Take 10 mg by mouth 5 (five) times daily.     . Probiotic Product (PROBIOTIC DAILY PO) Take 1 capsule by mouth daily.    . TURMERIC CURCUMIN PO Take by mouth.    . Vitamin D-Vitamin K (D3 + K2 DOTS PO) Take 1 tablet by mouth at bedtime.     No current facility-administered medications for this visit.    SURGICAL HISTORY:  Past Surgical History:  Procedure Laterality Date  . APPENDECTOMY    . COLONOSCOPY    . HERNIA REPAIR     x 2  . LAPAROSCOPIC NEPHRECTOMY Left 04/04/2013   Procedure: LAPAROSCOPIC LEFT RADICAL NEPHRECTOMY ;  Surgeon: [name], MD;  Location: [facility];  Service: Urology;  Laterality: Left;  . LUNG BIOPSY Left 08/08/2016   Procedure: LEFT LUNG BIOPSY;  Surgeon: Grace Isaac, MD;  Location: [facility];  Service: Thoracic;  Laterality: Left;  . testicular removal  2000  . VIDEO BRONCHOSCOPY N/A 11/30/2012   Procedure: VIDEO BRONCHOSCOPY WITH FLUORO;  Surgeon: Elsie Stain, MD;  Location: [facility];  Service: Cardiopulmonary;  Laterality: N/A;  . VIDEO BRONCHOSCOPY N/A 08/08/2016   Procedure: VIDEO BRONCHOSCOPY;  Surgeon:  Grace Isaac, MD;  Location: [facility];  Service: Thoracic;  Laterality: N/A;    REVIEW OF SYSTEMS:   Review of Systems  Constitutional: Negative for appetite change, chills, fatigue, fever and unexpected weight change.  HENT:   Negative for mouth sores, nosebleeds, sore throat and trouble swallowing.   Eyes: Negative for eye problems and icterus.  Respiratory: Negative for cough, hemoptysis, shortness of breath and wheezing.   Cardiovascular: Negative for chest pain and leg swelling.  Gastrointestinal: Negative for abdominal pain, constipation, diarrhea, nausea and vomiting.  Genitourinary: Negative for bladder incontinence, difficulty urinating, dysuria, frequency and hematuria.   Musculoskeletal: Negative for back pain, gait problem, neck pain and neck stiffness.  Skin: Negative for itching and rash.  Neurological: Negative for dizziness, extremity weakness, gait problem, headaches, light-headedness and seizures.  Hematological: Negative for adenopathy. Does not bruise/bleed easily.  Psychiatric/Behavioral: Negative for confusion, depression and sleep disturbance. The patient is not nervous/anxious.     PHYSICAL EXAMINATION:  Blood pressure 127/83, pulse 100, temperature 98.6 F (37 C), temperature source Tympanic, resp. rate 12, height 5' 7"  (1.702 m), weight 169 lb 14.4 oz (77.1 kg), SpO2 96 %.  ECOG PERFORMANCE STATUS: 1 - Symptomatic but completely ambulatory  Physical Exam  Constitutional: Oriented to person, place, and time and well-developed, well-nourished, and in no distress.  HENT:  Head: Normocephalic and atraumatic.  Mouth/Throat: Oropharynx is clear and moist. No oropharyngeal exudate.  Eyes: Conjunctivae are normal. Right eye exhibits no discharge. Left eye exhibits no discharge. No scleral icterus.  Neck: Normal range of motion. Neck supple.  Cardiovascular: Normal rate, regular rhythm, normal heart sounds and intact distal pulses.   Pulmonary/Chest: Effort normal  and breath sounds normal. No respiratory distress. No wheezes. No rales.  Abdominal: Soft. Bowel sounds are normal. Exhibits no distension and no mass. There is no tenderness.  Musculoskeletal: Normal range of motion. Exhibits no edema.  Lymphadenopathy:    No cervical adenopathy.  Neurological: Alert and oriented to person, place, and time. Exhibits normal muscle tone. Gait normal. Coordination normal.  Skin: Skin is warm and dry. No rash noted. Not diaphoretic. No erythema. No pallor.  Psychiatric: Mood, memory and judgment normal.  Vitals reviewed.  LABORATORY DATA: Lab Results  Component Value Date   WBC 8.7 10/31/2019   HGB 14.4 10/31/2019   HCT 46.0 10/31/2019   MCV 92.0 10/31/2019   PLT 309 10/31/2019      Chemistry      Component Value Date/Time   NA 139 10/31/2019 0930   NA 142 06/30/2016 1406   K 4.5 10/31/2019 0930   K 4.8 06/30/2016 1406   CL 105 10/31/2019 0930   CO2 26 10/31/2019 0930   CO2 29 06/30/2016 1406   BUN 16 10/31/2019 0930   BUN 14.8 06/30/2016 1406   CREATININE 1.27 (H) 10/31/2019 0930   CREATININE 1.31 (H) 10/03/2019 1252   CREATININE 1.4 (H) 06/30/2016 1406      Component  Value Date/Time   CALCIUM 9.3 10/31/2019 0930   CALCIUM 9.8 06/30/2016 1406   ALKPHOS 107 10/03/2019 1252   ALKPHOS 122 06/30/2016 1406   AST 14 (L) 10/03/2019 1252   AST 11 06/30/2016 1406   ALT 12 10/03/2019 1252   ALT 13 06/30/2016 1406   BILITOT 0.3 10/03/2019 1252   BILITOT 0.40 06/30/2016 1406       RADIOGRAPHIC STUDIES:  CT Biopsy  Result Date: 10/31/2019 INDICATION: 69-year-old male with history of lung cancer, renal cell carcinoma, and testicular cancer presenting with enlarging left adrenal mass. EXAM: CT BIOPSY COMPARISON:  CT abdomen pelvis from 10/03/2018 MEDICATIONS: None. ANESTHESIA/SEDATION: Fentanyl 100 mcg IV; Versed 1 mg IV Sedation time: 28 minutes; The patient was continuously monitored during the procedure by the interventional radiology nurse  under my direct supervision. CONTRAST:  None. COMPLICATIONS: None immediate. PROCEDURE: Informed consent was obtained from the patient following an explanation of the procedure, risks, benefits and alternatives. A time out was performed prior to the initiation of the procedure. The patient was positioned left lateral decubitus on the CT table and a limited CT was performed for procedural planning demonstrating similar appearing left adrenal mass measuring up to approximately 3.2 cm. The procedure was planned. The operative site was prepped and draped in the usual sterile fashion. Appropriate trajectory was confirmed with a 22 gauge spinal needle after the adjacent tissues were anesthetized with 1% Lidocaine with epinephrine. Under intermittent CT guidance, a 17 gauge coaxial needle was advanced into the peripheral aspect of the mass. Appropriate positioning was confirmed and a total of 4 samples were obtained with an 18 gauge core needle biopsy device. The co-axial needle was removed while injecting Gel-Foam slurry and hemostasis was achieved with manual compression. A limited postprocedural CT was negative for hemorrhage or additional complication. A dressing was placed. The patient tolerated the procedure well without immediate postprocedural complication. IMPRESSION: Technically successful CT guided core needle biopsy of left adrenal mass. Ruthann Cancer, MD [facility] Electronically Signed   By: Ruthann Cancer MD   On: 10/31/2019 13:27     ASSESSMENT/PLAN:  This is a very pleasant 69 year old Caucasian male with a history of stage IIIa non-small cell lung cancer, squamous cell carcinoma.  He presented with a left upper lobe lung lesion in 2014.  The patient is status post concurrent chemoradiation with carboplatin and paclitaxel followed by observation since the patient was not a good candidate for consolidation chemotherapy at that time.  He also  has a history of renal cell carcinoma.  He is status post left radical nephrectomy in 2015 under the care of Dr. Louis Meckel.   The patient had been on observation since 2015.   In September 2020, he had a repeat CT scan of the chest, abdomen pelvis performed recently showed enlarging left adrenal mass but the mass has low hypermetabolic activity on the previous PET scan.   The patient recently had a repeat CT scan which showed new 7 mm nodule in the posterior right lower lobe associated with impacted airway suggesting infection/inflammatory etiology but he has further enlargement of the left adrenal nodule suspicious for disease metastasis. Dr. Julien Nordmann recommended a CT guided core biopsy of the enlarging left adrenal nodule to rule out metastatic disease.   The patient was seen with Dr. Julien Nordmann. The pathology was consistent with clear cell carcinoma.   Dr. Julien Nordmann recommends that he follow up with urology and see if he is a candidate for any  intervention. If not, we will see the patient back to discuss systemic treatment with immunotherapy.   I have placed a referral to Dr. Louis Meckel from urology.   We will see the patient back for a follow up appointment in 2 months for evaluation and repeat blood work.  The patient was advised to call immediately if he has any concerning symptoms in the interval. The patient voices understanding of current disease status and treatment options and is in agreement with the current care plan. All questions were answered. The patient knows to call the clinic with any problems, questions or concerns. We can certainly see the patient much sooner if necessary    Orders Placed This Encounter  Procedures  . CBC with Differential (Cancer Center Only)    Standing Status:   Future    Standing Expiration Date:   11/01/2020  . CMP ([facility] only)    Standing Status:   Future    Standing Expiration Date:   11/01/2020  . Ambulatory referral to Urology    Referral  Priority:   Routine    Referral Type:   Consultation    Referral Reason:   Specialty Services Required    Referred to Provider:   [name], MD    Requested Specialty:   Urology    Number of Visits Requested:   [place], PA-C 11/02/19  ADDENDUM: Hematology/Oncology Attending: I had a face-to-face encounter with the patient today.  I recommended his care plan.  This is a very pleasant 69 years old white male with history of stage IIIa non-small cell lung cancer, squamous cell carcinoma diagnosed in 2014 status post a course of concurrent chemoradiation and he did not receive consolidation chemotherapy at that time.  He has been followed by observation.  The patient was also diagnosed with left renal cell carcinoma status post left radical nephrectomy in 2015 under the care of Dr. Louis Meckel.  The patient has been on observation but recent imaging studies showed enlargement of the left adrenal nodule suspicious for disease metastasis.  The patient underwent CT-guided biopsy of the left adrenal mass and the pathology confirms clear cell carcinoma consistent with metastatic disease from his previous left renal cell carcinoma. I had a lengthy discussion with the patient today about his condition and treatment options. I recommended for the patient to see Dr. Louis Meckel for reevaluation and discussion of any surgical intervention for this solitary metastasis. If the patient is not a good candidate for surgical resection, we may consider him for treatment with immunotherapy. I will arrange for the patient to come back for follow-up visit in 2 months for evaluation and more detailed discussion of his treatment option after his surgical evaluation by urology. The patient was advised to call immediately if he has any other concerning symptoms in the interval.  Disclaimer: This note was dictated with voice recognition software. Similar sounding words can inadvertently be  transcribed and may be missed upon review. Eilleen Kempf, MD 11/02/19

## 2019-10-31 NOTE — H&P (Signed)
Chief Complaint: Patient was seen in consultation today for left adrenal mass biopsy at the request of [facility]  Referring Physician(s): Mohamed,Mohamed  Supervising Physician: Dr. Serafina Royals  Patient Status: [facility] - In-pt  History of Present Illness: [name] is a 69 y.o. male with enlarging left adrenal mass. He is referred for biopsy. PMHx, meds, labs, imaging, allergies reviewed. Feels well, no recent fevers, chills, illness. Has been NPO today as directed.    Past Medical History:  Diagnosis Date  . Arthritis   . Cancer of kidney ([place])    left  . Complication of anesthesia    Very little movement of neck- "self fusioning"  . Crohn's disease ([place])    no issues since 2012  . Difficulty sleeping    occasionally  . Dyspnea    with a little exertion,  "smoked cigarettes all my life"  . History of blood transfusion    "chron's flare"  . Lung cancer ([place])   . Renal cell carcinoma of left kidney ([place]) 02/20/2015  . Renal mass   . Renal mass, left   . Sleep apnea 05/2016  . Testicular cancer ([place]) 2000   s/p resection, no recurrence    Past Surgical History:  Procedure Laterality Date  . APPENDECTOMY    . COLONOSCOPY    . HERNIA REPAIR     x 2  . LAPAROSCOPIC NEPHRECTOMY Left 04/04/2013   Procedure: LAPAROSCOPIC LEFT RADICAL NEPHRECTOMY ;  Surgeon: [name], MD;  Location: [facility];  Service: Urology;  Laterality: Left;  . LUNG BIOPSY Left 08/08/2016   Procedure: LEFT LUNG BIOPSY;  Surgeon: Grace Isaac, MD;  Location: [place];  Service: Thoracic;  Laterality: Left;  . testicular removal  2000  . VIDEO BRONCHOSCOPY N/A 11/30/2012   Procedure: VIDEO BRONCHOSCOPY WITH FLUORO;  Surgeon: Elsie Stain, MD;  Location: [facility];  Service: Cardiopulmonary;  Laterality: N/A;  . VIDEO BRONCHOSCOPY N/A 08/08/2016   Procedure: VIDEO BRONCHOSCOPY;  Surgeon: Grace Isaac, MD;  Location: [facility];  Service: Thoracic;  Laterality: N/A;     Allergies: Ativan [lorazepam]  Medications: Prior to Admission medications   Medication Sig Start Date End Date Taking? Authorizing Provider  Astaxanthin 4 MG CAPS Take by mouth.   Yes [provider]  hydrOXYzine (ATARAX/VISTARIL) 50 MG tablet Take 12.5 mg by mouth at bedtime as needed (for sleep.).   Yes [provider]  OVER THE COUNTER MEDICATION Take 1 tablet by mouth daily.   Yes [provider]  Oxycodone HCl 10 MG TABS Take 10 mg by mouth 5 (five) times daily.  07/10/16  Yes [provider]  Probiotic Product (PROBIOTIC DAILY PO) Take 1 capsule by mouth daily.   Yes [provider]  TURMERIC CURCUMIN PO Take by mouth.   Yes [provider]  Vitamin D-Vitamin K (D3 + K2 DOTS PO) Take 1 tablet by mouth at bedtime.   Yes [provider]  DULoxetine (CYMBALTA) 30 MG capsule Take 30 mg by mouth at bedtime. 08/29/19   [provider]     Family History  Problem Relation Age of Onset  . Asthma Brother   . Testicular cancer Brother   . Cancer Brother   . Breast cancer Sister     Social History   Socioeconomic History  . Marital status: Married    Spouse name: Not on file  . Number of children: Not on file  . Years of education: Not on file  .  Highest education level: Not on file  Occupational History  . Occupation: Optometrist  Tobacco Use  . Smoking status: Current Every Day Smoker    Packs/day: 1.00    Years: 50.00    Pack years: 50.00    Types: Cigarettes    Start date: 01/06/1966  . Smokeless tobacco: Never Used  Vaping Use  . Vaping Use: Never used  Substance and Sexual Activity  . Alcohol use: Yes    Alcohol/week: 6.0 standard drinks    Types: 6 Cans of beer per week  . Drug use: No  . Sexual activity: Not on file  Other Topics Concern  . Not on file  Social History Narrative  . Not on file   Social Determinants of Health   Financial Resource Strain:   . Difficulty of Paying Living  Expenses: Not on file  Food Insecurity:   . Worried About Charity fundraiser in the Last Year: Not on file  . Ran Out of Food in the Last Year: Not on file  Transportation Needs:   . Lack of Transportation (Medical): Not on file  . Lack of Transportation (Non-Medical): Not on file  Physical Activity:   . Days of Exercise per Week: Not on file  . Minutes of Exercise per Session: Not on file  Stress:   . Feeling of Stress : Not on file  Social Connections:   . Frequency of Communication with Friends and Family: Not on file  . Frequency of Social Gatherings with Friends and Family: Not on file  . Attends Religious Services: Not on file  . Active Member of Clubs or Organizations: Not on file  . Attends Archivist Meetings: Not on file  . Marital Status: Not on file     Review of Systems: A 12 point ROS discussed and pertinent positives are indicated in the HPI above.  All other systems are negative.  Review of Systems  Vital Signs: BP 124/77   Pulse 89   Temp 99.2 F (37.3 C) (Oral)   Resp 18   SpO2 95%   Physical Exam Constitutional:      Appearance: Normal appearance.  HENT:     Mouth/Throat:     Mouth: Mucous membranes are moist.  Cardiovascular:     Rate and Rhythm: Normal rate and regular rhythm.     Heart sounds: Normal heart sounds.  Pulmonary:     Effort: Pulmonary effort is normal. No respiratory distress.     Breath sounds: Normal breath sounds.  Abdominal:     General: Abdomen is flat. There is no distension.     Palpations: Abdomen is soft. There is no mass.     Tenderness: There is no abdominal tenderness.  Skin:    General: Skin is warm and dry.  Neurological:     General: No focal deficit present.     Mental Status: He is alert and oriented to person, place, and time.  Psychiatric:        Mood and Affect: Mood normal.        Thought Content: Thought content normal.        Judgment: Judgment normal.     Imaging: CT Abdomen Pelvis Wo  Contrast  Result Date: 10/04/2019 CLINICAL DATA:  Non-small-cell lung cancer.  Restaging. EXAM: CT CHEST, ABDOMEN AND PELVIS WITHOUT CONTRAST TECHNIQUE: Multidetector CT imaging of the chest, abdomen and pelvis was performed following the standard protocol without IV contrast. COMPARISON:  04/01/2019 FINDINGS: CT CHEST FINDINGS Cardiovascular:  The heart size is normal. No substantial pericardial effusion. Coronary artery calcification is evident. Atherosclerotic calcification is noted in the wall of the thoracic aorta. Mediastinum/Nodes: Calcified nodal tissue again noted in the subcarinal station and right hilum. No mediastinal lymphadenopathy. No evidence for gross hilar lymphadenopathy although assessment is limited by the lack of intravenous contrast on today's study. The esophagus has normal imaging features. There is no axillary lymphadenopathy. Lungs/Pleura: Centrilobular emphsyema noted. Volume loss left hemithorax with dense consolidative opacity in the left parahilar and suprahilar lung is stable in the interval, compatible with post treatment scarring. No new or progressive findings to suggest local recurrence. Patient does have a new 7 mm nodule in the posterior right lower lobe (74/4) that is associated with a impacted airway suggesting infectious/inflammatory etiology no additional new suspicious nodule or mass. No pleural effusion. Musculoskeletal: No worrisome lytic or sclerotic osseous abnormality. CT ABDOMEN PELVIS FINDINGS Hepatobiliary: No focal abnormality in the liver on this study without intravenous contrast. There is no evidence for gallstones, gallbladder wall thickening, or pericholecystic fluid. No intrahepatic or extrahepatic biliary dilation. Pancreas: No focal mass lesion. No dilatation of the main duct. No intraparenchymal cyst. No peripancreatic edema. Spleen: No splenomegaly. No focal mass lesion. Adrenals/Urinary Tract: Stable mild thickening right adrenal gland. The left adrenal  nodule has progressed in the interval measuring 3.2 x 2.5 cm today compared to 2.6 x 2.0 cm previously. Right ureter unremarkable. The urinary bladder appears normal for the degree of distention. Stomach/Bowel: Stomach is unremarkable. No gastric wall thickening. No evidence of outlet obstruction. Duodenum is normally positioned as is the ligament of Treitz. No small bowel wall thickening. No small bowel dilatation. The terminal ileum is normal. The appendix is not visualized, but there is no edema or inflammation in the region of the cecum. No gross colonic mass. No colonic wall thickening. Vascular/Lymphatic: There is abdominal aortic atherosclerosis without aneurysm. There is no gastrohepatic or hepatoduodenal ligament lymphadenopathy. No retroperitoneal or mesenteric lymphadenopathy. No pelvic sidewall lymphadenopathy. Reproductive: The prostate gland and seminal vesicles are unremarkable. Other: No intraperitoneal free fluid. Musculoskeletal: No worrisome lytic or sclerotic osseous abnormality. IMPRESSION: 1. Stable appearance of post treatment scarring in the left parahilar and suprahilar lung. No new or progressive findings to suggest local recurrence. 2. New 7 mm nodule posterior right lower lobe associated with a impacted airway suggesting infectious/inflammatory etiology. Although infectious/inflammatory etiology is favored, close follow-up required and repeat CT chest in 3 months recommended. 3. Interval progression of the left adrenal nodule, now measuring 3.2 x 2.5 cm compared to 2.6 x 2.0 cm previously. This lesion measured 1.6 cm on PET-CT of 10/05/2018 and showed only low level FDG uptake at that time. Given continued progression, close follow-up recommended. Repeat PET-CT may be warranted. 4. Aortic Atherosclerosis (ICD10-I70.0) and Emphysema (ICD10-J43.9). Electronically Signed   By: Misty Stanley M.D.   On: 10/04/2019 09:58   CT Chest Wo Contrast  Result Date: 10/04/2019 CLINICAL DATA:   Non-small-cell lung cancer.  Restaging. EXAM: CT CHEST, ABDOMEN AND PELVIS WITHOUT CONTRAST TECHNIQUE: Multidetector CT imaging of the chest, abdomen and pelvis was performed following the standard protocol without IV contrast. COMPARISON:  04/01/2019 FINDINGS: CT CHEST FINDINGS Cardiovascular: The heart size is normal. No substantial pericardial effusion. Coronary artery calcification is evident. Atherosclerotic calcification is noted in the wall of the thoracic aorta. Mediastinum/Nodes: Calcified nodal tissue again noted in the subcarinal station and right hilum. No mediastinal lymphadenopathy. No evidence for gross hilar lymphadenopathy although assessment is limited by  the lack of intravenous contrast on today's study. The esophagus has normal imaging features. There is no axillary lymphadenopathy. Lungs/Pleura: Centrilobular emphsyema noted. Volume loss left hemithorax with dense consolidative opacity in the left parahilar and suprahilar lung is stable in the interval, compatible with post treatment scarring. No new or progressive findings to suggest local recurrence. Patient does have a new 7 mm nodule in the posterior right lower lobe (74/4) that is associated with a impacted airway suggesting infectious/inflammatory etiology no additional new suspicious nodule or mass. No pleural effusion. Musculoskeletal: No worrisome lytic or sclerotic osseous abnormality. CT ABDOMEN PELVIS FINDINGS Hepatobiliary: No focal abnormality in the liver on this study without intravenous contrast. There is no evidence for gallstones, gallbladder wall thickening, or pericholecystic fluid. No intrahepatic or extrahepatic biliary dilation. Pancreas: No focal mass lesion. No dilatation of the main duct. No intraparenchymal cyst. No peripancreatic edema. Spleen: No splenomegaly. No focal mass lesion. Adrenals/Urinary Tract: Stable mild thickening right adrenal gland. The left adrenal nodule has progressed in the interval measuring 3.2  x 2.5 cm today compared to 2.6 x 2.0 cm previously. Right ureter unremarkable. The urinary bladder appears normal for the degree of distention. Stomach/Bowel: Stomach is unremarkable. No gastric wall thickening. No evidence of outlet obstruction. Duodenum is normally positioned as is the ligament of Treitz. No small bowel wall thickening. No small bowel dilatation. The terminal ileum is normal. The appendix is not visualized, but there is no edema or inflammation in the region of the cecum. No gross colonic mass. No colonic wall thickening. Vascular/Lymphatic: There is abdominal aortic atherosclerosis without aneurysm. There is no gastrohepatic or hepatoduodenal ligament lymphadenopathy. No retroperitoneal or mesenteric lymphadenopathy. No pelvic sidewall lymphadenopathy. Reproductive: The prostate gland and seminal vesicles are unremarkable. Other: No intraperitoneal free fluid. Musculoskeletal: No worrisome lytic or sclerotic osseous abnormality. IMPRESSION: 1. Stable appearance of post treatment scarring in the left parahilar and suprahilar lung. No new or progressive findings to suggest local recurrence. 2. New 7 mm nodule posterior right lower lobe associated with a impacted airway suggesting infectious/inflammatory etiology. Although infectious/inflammatory etiology is favored, close follow-up required and repeat CT chest in 3 months recommended. 3. Interval progression of the left adrenal nodule, now measuring 3.2 x 2.5 cm compared to 2.6 x 2.0 cm previously. This lesion measured 1.6 cm on PET-CT of 10/05/2018 and showed only low level FDG uptake at that time. Given continued progression, close follow-up recommended. Repeat PET-CT may be warranted. 4. Aortic Atherosclerosis (ICD10-I70.0) and Emphysema (ICD10-J43.9). Electronically Signed   By: Misty Stanley M.D.   On: 10/04/2019 09:58    Labs:  CBC: Recent Labs    02/01/19 1354 04/01/19 1208 10/03/19 1252 10/31/19 0930  WBC 9.5 8.4 9.0 8.7  HGB  15.4 15.1 14.4 14.4  HCT 47.5 47.0 45.8 46.0  PLT 302 304 331 309    COAGS: Recent Labs    10/31/19 0930  INR 1.0    BMP: Recent Labs    02/01/19 1354 02/01/19 1354 02/03/19 1112 04/01/19 1208 10/03/19 1252 10/31/19 0930  NA 136   < > 141 140 136 139  K 5.6*   < > 5.0 4.7 4.8 4.5  CL 99   < > 102 103 101 105  CO2 28   < > 31 29 31 26   GLUCOSE 92   < > 95 90 98 98  BUN 19   < > 17 14 13 16   CALCIUM 9.7   < > 9.4 9.6 9.8 9.3  CREATININE  1.47*   < > 1.48* 1.33* 1.31* 1.27*  GFRNONAA 48*   < > 47* 54* 55* >60  GFRAA 55*  --  55* >60 >60  --    < > = values in this interval not displayed.    LIVER FUNCTION TESTS: Recent Labs    02/01/19 1354 04/01/19 1208 10/03/19 1252  BILITOT 0.3 0.4 0.3  AST 14* 11* 14*  ALT 12 8 12   ALKPHOS 119 107 107  PROT 7.9 7.3 7.2  ALBUMIN 3.8 3.5 3.3*    TUMOR MARKERS: No results for input(s): AFPTM, CEA, CA199, CHROMGRNA in the last 8760 hours.  Assessment and Plan: Left adrenal mass Hx of lung and renal cell ca For image guided biopsy Labs okay Risks and benefits of left adrenal mass biopsy was discussed with the patient and/or patient's family including, but not limited to bleeding, infection, damage to adjacent structures or low yield requiring additional tests.  All of the questions were answered and there is agreement to proceed.  Consent signed and in chart.   Thank you for this interesting consult.  I greatly enjoyed meeting DAYN BARICH and look forward to participating in their care.  A copy of this report was sent to the requesting provider on this date.  Electronically Signed: Ascencion Dike, PA-C 10/31/2019, 10:28 AM   I spent a total of 20 in face to face in clinical consultation, greater than 50% of which was counseling/coordinating care for left adrenal mass bx

## 2019-11-02 ENCOUNTER — Other Ambulatory Visit: Payer: Self-pay

## 2019-11-02 ENCOUNTER — Encounter: Payer: Self-pay | Admitting: Physician Assistant

## 2019-11-02 ENCOUNTER — Inpatient Hospital Stay: Payer: Medicare Other | Attending: Internal Medicine | Admitting: Physician Assistant

## 2019-11-02 VITALS — BP 127/83 | HR 100 | Temp 98.6°F | Resp 12 | Ht 67.0 in | Wt 169.9 lb

## 2019-11-02 DIAGNOSIS — Z85118 Personal history of other malignant neoplasm of bronchus and lung: Secondary | ICD-10-CM | POA: Diagnosis present

## 2019-11-02 DIAGNOSIS — Z923 Personal history of irradiation: Secondary | ICD-10-CM | POA: Diagnosis not present

## 2019-11-02 DIAGNOSIS — Z85528 Personal history of other malignant neoplasm of kidney: Secondary | ICD-10-CM | POA: Insufficient documentation

## 2019-11-02 DIAGNOSIS — Z905 Acquired absence of kidney: Secondary | ICD-10-CM | POA: Insufficient documentation

## 2019-11-02 DIAGNOSIS — C642 Malignant neoplasm of left kidney, except renal pelvis: Secondary | ICD-10-CM

## 2019-11-02 DIAGNOSIS — Z7189 Other specified counseling: Secondary | ICD-10-CM

## 2019-11-03 ENCOUNTER — Telehealth: Payer: Self-pay | Admitting: Internal Medicine

## 2019-11-03 ENCOUNTER — Telehealth: Payer: Self-pay | Admitting: Physician Assistant

## 2019-11-03 LAB — SURGICAL PATHOLOGY

## 2019-11-03 NOTE — Telephone Encounter (Signed)
Release: [id number] Faxed medical records to [facility] @ [phone]

## 2019-11-03 NOTE — Telephone Encounter (Signed)
Scheduled per los. Called and spoke with patient. Confirmed appt 

## 2020-01-03 ENCOUNTER — Telehealth: Payer: Self-pay | Admitting: Medical Oncology

## 2020-01-03 ENCOUNTER — Inpatient Hospital Stay: Payer: Medicare Other

## 2020-01-03 ENCOUNTER — Inpatient Hospital Stay: Payer: Medicare Other | Admitting: Internal Medicine

## 2020-01-03 NOTE — Telephone Encounter (Signed)
Cancelled app today -He has an appt with urology jan 25th and wants to wait to see [facility] after urology visit.

## 2020-01-31 DIAGNOSIS — C3490 Malignant neoplasm of unspecified part of unspecified bronchus or lung: Secondary | ICD-10-CM | POA: Diagnosis not present

## 2020-01-31 DIAGNOSIS — C7972 Secondary malignant neoplasm of left adrenal gland: Secondary | ICD-10-CM | POA: Diagnosis not present

## 2020-01-31 DIAGNOSIS — C642 Malignant neoplasm of left kidney, except renal pelvis: Secondary | ICD-10-CM | POA: Diagnosis not present

## 2020-02-01 ENCOUNTER — Other Ambulatory Visit: Payer: Self-pay | Admitting: Urology

## 2020-02-06 NOTE — Patient Instructions (Addendum)
DUE TO COVID-19 ONLY ONE VISITOR IS ALLOWED TO COME WITH YOU AND STAY IN THE WAITING ROOM ONLY DURING PRE OP AND PROCEDURE.   IF YOU WILL BE ADMITTED INTO THE HOSPITAL YOU ARE ALLOWED ONE SUPPORT PERSON DURING VISITATION HOURS ONLY (10AM -8PM)   . The support person may change daily. . The support person must pass our screening, gel in and out, and wear a mask at all times, including in the patient's room. . Patients must also wear a mask when staff or their support person are in the room.   COVID SWAB TESTING MUST BE COMPLETED ON:   Tuesday, 02-14-20 @  12:00 PM   [address] [place], [place] [address]  (Must self quarantine after testing. Follow instructions on handout.)         Your procedure is scheduled on:  Friday, 02-17-20   Report to [facility] Main  Entrance   Report to Short Stay at 5:30 AM   [facility])   Call this number if you have problems the morning of surgery [phone]   Do not eat food :After Midnight.   May have liquids until 4:30 AM day of surgery  CLEAR LIQUID DIET  Foods Allowed                                                                     Foods Excluded  Water, Black Coffee and tea, regular and decaf             liquids that you cannot  Plain Jell-O in any flavor  (No red)                                    see through such as: Fruit ices (not with fruit pulp)                                      milk, soups, orange juice              Iced Popsicles (No red)                                      All solid food                                   Apple juices Sports drinks like Gatorade (No red) Lightly seasoned clear broth or consume(fat free) Sugar, honey syrup      Oral Hygiene is also important to reduce your risk of infection.                                    Remember - BRUSH YOUR TEETH THE MORNING OF SURGERY WITH YOUR REGULAR TOOTHPASTE   Do NOT smoke after Midnight   Take these medicines the morning of surgery with A SIP  OF WATER:  None  You may not have any metal on your body including  jewelry, and body piercings             Do not wear lotions, powders, perfumes/cologne, or deodorant             Men may shave face and neck.   Do not bring valuables to the hospital. [place].   Contacts, dentures or bridgework may not be worn into surgery.   Bring small overnight bag day of surgery.                Please read over the following fact sheets you were given: IF YOU HAVE QUESTIONS ABOUT YOUR PRE OP INSTRUCTIONS PLEASE CALL [facility] - Preparing for Surgery Before surgery, you can play an important role.  Because skin is not sterile, your skin needs to be as free of germs as possible.  You can reduce the number of germs on your skin by washing with CHG (chlorahexidine gluconate) soap before surgery.  CHG is an antiseptic cleaner which kills germs and bonds with the skin to continue killing germs even after washing. Please DO NOT use if you have an allergy to CHG or antibacterial soaps.  If your skin becomes reddened/irritated stop using the CHG and inform your nurse when you arrive at Short Stay. Do not shave (including legs and underarms) for at least 48 hours prior to the first CHG shower.  You may shave your face/neck.  Please follow these instructions carefully:  1.  Shower with CHG Soap the night before surgery and the  morning of surgery.  2.  If you choose to wash your hair, wash your hair first as usual with your normal  shampoo.  3.  After you shampoo, rinse your hair and body thoroughly to remove the shampoo.                             4.  Use CHG as you would any other liquid soap.  You can apply chg directly to the skin and wash.  Gently with a scrungie or clean washcloth.  5.  Apply the CHG Soap to your body ONLY FROM THE NECK DOWN.   Do   not use on face/ open                           Wound or open sores.  Avoid contact with eyes, ears mouth and   genitals (private parts).                       Wash face,  Genitals (private parts) with your normal soap.             6.  Wash thoroughly, paying special attention to the area where your    surgery  will be performed.  7.  Thoroughly rinse your body with warm water from the neck down.  8.  DO NOT shower/wash with your normal soap after using and rinsing off the CHG Soap.                9.  Pat yourself dry with a clean towel.            10.  Wear clean pajamas.            11.  Place clean sheets  on your bed the night of your first shower and do not  sleep with pets. Day of Surgery : Do not apply any lotions/deodorants the morning of surgery.  Please wear clean clothes to the hospital/surgery center.  FAILURE TO FOLLOW THESE INSTRUCTIONS MAY RESULT IN THE CANCELLATION OF YOUR SURGERY  PATIENT SIGNATURE_________________________________  NURSE SIGNATURE__________________________________  ________________________________________________________________________   WHAT IS A BLOOD TRANSFUSION? Blood Transfusion Information  A transfusion is the replacement of blood or some of its parts. Blood is made up of multiple cells which provide different functions.  Red blood cells carry oxygen and are used for blood loss replacement.  White blood cells fight against infection.  Platelets control bleeding.  Plasma helps clot blood.  Other blood products are available for specialized needs, such as hemophilia or other clotting disorders. BEFORE THE TRANSFUSION  Who gives blood for transfusions?   Healthy volunteers who are fully evaluated to make sure their blood is safe. This is blood bank blood. Transfusion therapy is the safest it has ever been in the practice of medicine. Before blood is taken from a donor, a complete history is taken to make sure that person has no history of diseases nor engages in risky social behavior (examples are intravenous  drug use or sexual activity with multiple partners). The donor's travel history is screened to minimize risk of transmitting infections, such as malaria. The donated blood is tested for signs of infectious diseases, such as HIV and hepatitis. The blood is then tested to be sure it is compatible with you in order to minimize the chance of a transfusion reaction. If you or a relative donates blood, this is often done in anticipation of surgery and is not appropriate for emergency situations. It takes many days to process the donated blood. RISKS AND COMPLICATIONS Although transfusion therapy is very safe and saves many lives, the main dangers of transfusion include:   Getting an infectious disease.  Developing a transfusion reaction. This is an allergic reaction to something in the blood you were given. Every precaution is taken to prevent this. The decision to have a blood transfusion has been considered carefully by your caregiver before blood is given. Blood is not given unless the benefits outweigh the risks. AFTER THE TRANSFUSION  Right after receiving a blood transfusion, you will usually feel much better and more energetic. This is especially true if your red blood cells have gotten low (anemic). The transfusion raises the level of the red blood cells which carry oxygen, and this usually causes an energy increase.  The nurse administering the transfusion will monitor you carefully for complications. HOME CARE INSTRUCTIONS  No special instructions are needed after a transfusion. You may find your energy is better. Speak with your caregiver about any limitations on activity for underlying diseases you may have. SEEK MEDICAL CARE IF:   Your condition is not improving after your transfusion.  You develop redness or irritation at the intravenous (IV) site. SEEK IMMEDIATE MEDICAL CARE IF:  Any of the following symptoms occur over the next 12 hours:  Shaking chills.  You have a temperature by  mouth above 102 F (38.9 C), not controlled by medicine.  Chest, back, or muscle pain.  People around you feel you are not acting correctly or are confused.  Shortness of breath or difficulty breathing.  Dizziness and fainting.  You get a rash or develop hives.  You have a decrease in urine output.  Your urine turns a dark color or changes to  pink, red, or brown. Any of the following symptoms occur over the next 10 days:  You have a temperature by mouth above 102 F (38.9 C), not controlled by medicine.  Shortness of breath.  Weakness after normal activity.  The white part of the eye turns yellow (jaundice).  You have a decrease in the amount of urine or are urinating less often.  Your urine turns a dark color or changes to pink, red, or brown. Document Released: 12/21/1999 Document Revised: 03/17/2011 Document Reviewed: 08/09/2007 [facility] Patient Information 2014 [place], [place].  _______________________________________________________________________

## 2020-02-06 NOTE — Progress Notes (Addendum)
COVID Vaccine Completed:  No Date COVID Vaccine completed: COVID vaccine manufacturer: Maceo   PCP - Lennette Bihari Via, MD Cardiologist - N/A  Chest x-ray - CT chest 10-04-19 in Epic EKG - N/A Stress Test -  ECHO -  Cardiac Cath -  Pacemaker/ICD device last checked:  Sleep Study - 02-26-16 in Epic, +sleep apnea CPAP - No  Fasting Blood Sugar - N/A Checks Blood Sugar _____ times a day  Blood Thinner Instructions: Aspirin Instructions:N/A Last Dose:  Anesthesia review:  COPD, sleep apnea  Patient denies shortness of breath, fever, cough and chest pain at PAT appointment.  Patient able to climb a flight of stairs and do housework.     Patient verbalized understanding of instructions that were given to them at the PAT appointment. Patient was also instructed that they will need to review over the PAT instructions again at home before surgery.

## 2020-02-07 ENCOUNTER — Encounter (HOSPITAL_COMMUNITY): Payer: Self-pay

## 2020-02-07 ENCOUNTER — Encounter (HOSPITAL_COMMUNITY)
Admission: RE | Admit: 2020-02-07 | Discharge: 2020-02-07 | Disposition: A | Discharge status: Home / Self Care | Payer: Medicare Other | Source: Ambulatory Visit | Attending: Urology | Admitting: Urology

## 2020-02-07 ENCOUNTER — Other Ambulatory Visit: Payer: Self-pay

## 2020-02-07 DIAGNOSIS — Z01812 Encounter for preprocedural laboratory examination: Secondary | ICD-10-CM | POA: Insufficient documentation

## 2020-02-07 HISTORY — DX: Malignant neoplasm of unspecified part of left adrenal gland: C74.92

## 2020-02-07 HISTORY — DX: Chronic obstructive pulmonary disease, unspecified: J44.9

## 2020-02-07 LAB — COMPREHENSIVE METABOLIC PANEL
ALT: 17 U/L (ref 0–44)
AST: 15 U/L (ref 15–41)
Albumin: 3.9 g/dL (ref 3.5–5.0)
Alkaline Phosphatase: 101 U/L (ref 38–126)
Anion gap: 11 (ref 5–15)
BUN: 21 mg/dL (ref 8–23)
CO2: 24 mmol/L (ref 22–32)
Calcium: 9.4 mg/dL (ref 8.9–10.3)
Chloride: 103 mmol/L (ref 98–111)
Creatinine, Ser: 1.35 mg/dL — ABNORMAL HIGH (ref 0.61–1.24)
GFR, Estimated: 56 mL/min — ABNORMAL LOW (ref >60)
Glucose, Bld: 95 mg/dL (ref 70–99)
Potassium: 4.7 mmol/L (ref 3.5–5.1)
Sodium: 138 mmol/L (ref 135–145)
Total Bilirubin: 0.7 mg/dL (ref 0.3–1.2)
Total Protein: 7.3 g/dL (ref 6.5–8.1)

## 2020-02-07 LAB — CBC
HCT: 46.7 % (ref 39.0–52.0)
Hemoglobin: 15.2 g/dL (ref 13.0–17.0)
MCH: 29.8 pg (ref 26.0–34.0)
MCHC: 32.5 g/dL (ref 30.0–36.0)
MCV: 91.6 fL (ref 80.0–100.0)
Platelets: 330 10*3/uL (ref 150–400)
RBC: 5.1 MIL/uL (ref 4.22–5.81)
RDW: 13.7 % (ref 11.5–15.5)
WBC: 8.8 10*3/uL (ref 4.0–10.5)
nRBC: 0 % (ref 0.0–0.2)

## 2020-02-09 LAB — URINE CULTURE: Culture: NO GROWTH

## 2020-02-14 ENCOUNTER — Other Ambulatory Visit (HOSPITAL_COMMUNITY): Payer: Medicare Other

## 2020-02-14 ENCOUNTER — Other Ambulatory Visit (HOSPITAL_COMMUNITY)
Admission: RE | Admit: 2020-02-14 | Discharge: 2020-02-14 | Disposition: A | Discharge status: Home / Self Care | Payer: Medicare Other | Source: Ambulatory Visit | Attending: Urology | Admitting: Urology

## 2020-02-14 DIAGNOSIS — K660 Peritoneal adhesions (postprocedural) (postinfection): Secondary | ICD-10-CM | POA: Diagnosis not present

## 2020-02-14 DIAGNOSIS — M47812 Spondylosis without myelopathy or radiculopathy, cervical region: Secondary | ICD-10-CM | POA: Diagnosis not present

## 2020-02-14 DIAGNOSIS — M1990 Unspecified osteoarthritis, unspecified site: Secondary | ICD-10-CM | POA: Diagnosis not present

## 2020-02-14 DIAGNOSIS — Z87891 Personal history of nicotine dependence: Secondary | ICD-10-CM | POA: Diagnosis not present

## 2020-02-14 DIAGNOSIS — M47817 Spondylosis without myelopathy or radiculopathy, lumbosacral region: Secondary | ICD-10-CM | POA: Diagnosis not present

## 2020-02-14 DIAGNOSIS — Z79899 Other long term (current) drug therapy: Secondary | ICD-10-CM | POA: Diagnosis not present

## 2020-02-14 DIAGNOSIS — Z79891 Long term (current) use of opiate analgesic: Secondary | ICD-10-CM | POA: Diagnosis not present

## 2020-02-14 DIAGNOSIS — Z01812 Encounter for preprocedural laboratory examination: Secondary | ICD-10-CM | POA: Insufficient documentation

## 2020-02-14 DIAGNOSIS — Z9221 Personal history of antineoplastic chemotherapy: Secondary | ICD-10-CM | POA: Diagnosis not present

## 2020-02-14 DIAGNOSIS — Z20822 Contact with and (suspected) exposure to covid-19: Secondary | ICD-10-CM | POA: Insufficient documentation

## 2020-02-14 DIAGNOSIS — G4700 Insomnia, unspecified: Secondary | ICD-10-CM | POA: Diagnosis not present

## 2020-02-14 DIAGNOSIS — J449 Chronic obstructive pulmonary disease, unspecified: Secondary | ICD-10-CM | POA: Diagnosis not present

## 2020-02-14 DIAGNOSIS — G4733 Obstructive sleep apnea (adult) (pediatric): Secondary | ICD-10-CM | POA: Diagnosis not present

## 2020-02-14 DIAGNOSIS — C649 Malignant neoplasm of unspecified kidney, except renal pelvis: Secondary | ICD-10-CM | POA: Diagnosis not present

## 2020-02-14 DIAGNOSIS — G894 Chronic pain syndrome: Secondary | ICD-10-CM | POA: Diagnosis not present

## 2020-02-14 DIAGNOSIS — Z85118 Personal history of other malignant neoplasm of bronchus and lung: Secondary | ICD-10-CM | POA: Diagnosis not present

## 2020-02-14 DIAGNOSIS — Z981 Arthrodesis status: Secondary | ICD-10-CM | POA: Diagnosis not present

## 2020-02-14 DIAGNOSIS — Z888 Allergy status to other drugs, medicaments and biological substances status: Secondary | ICD-10-CM | POA: Diagnosis not present

## 2020-02-14 DIAGNOSIS — Z8547 Personal history of malignant neoplasm of testis: Secondary | ICD-10-CM | POA: Diagnosis not present

## 2020-02-14 DIAGNOSIS — C7972 Secondary malignant neoplasm of left adrenal gland: Secondary | ICD-10-CM | POA: Diagnosis not present

## 2020-02-14 DIAGNOSIS — C642 Malignant neoplasm of left kidney, except renal pelvis: Secondary | ICD-10-CM | POA: Diagnosis not present

## 2020-02-14 DIAGNOSIS — Z9079 Acquired absence of other genital organ(s): Secondary | ICD-10-CM | POA: Diagnosis not present

## 2020-02-14 DIAGNOSIS — Z923 Personal history of irradiation: Secondary | ICD-10-CM | POA: Diagnosis not present

## 2020-02-14 DIAGNOSIS — Z8043 Family history of malignant neoplasm of testis: Secondary | ICD-10-CM | POA: Diagnosis not present

## 2020-02-14 DIAGNOSIS — Z905 Acquired absence of kidney: Secondary | ICD-10-CM | POA: Diagnosis not present

## 2020-02-14 LAB — SARS CORONAVIRUS 2 (TAT 6-24 HRS): SARS Coronavirus 2: NEGATIVE

## 2020-02-16 DIAGNOSIS — M47812 Spondylosis without myelopathy or radiculopathy, cervical region: Secondary | ICD-10-CM | POA: Diagnosis not present

## 2020-02-16 DIAGNOSIS — G894 Chronic pain syndrome: Secondary | ICD-10-CM | POA: Diagnosis not present

## 2020-02-16 DIAGNOSIS — G4700 Insomnia, unspecified: Secondary | ICD-10-CM | POA: Diagnosis not present

## 2020-02-16 DIAGNOSIS — M47817 Spondylosis without myelopathy or radiculopathy, lumbosacral region: Secondary | ICD-10-CM | POA: Diagnosis not present

## 2020-02-16 NOTE — Anesthesia Preprocedure Evaluation (Addendum)
Anesthesia Evaluation  Patient identified by MRN, date of birth, ID band Patient awake    Reviewed: Allergy & Precautions, NPO status   Airway Mallampati: I       Dental  (+) Poor Dentition, Missing   Pulmonary COPD, Current Smoker and Patient abstained from smoking.,    Pulmonary exam normal        Cardiovascular negative cardio ROS Normal cardiovascular exam     Neuro/Psych negative neurological ROS  negative psych ROS   GI/Hepatic negative GI ROS, Neg liver ROS,   Endo/Other    Renal/GU Renal InsufficiencyRenal disease     Musculoskeletal   Abdominal Normal abdominal exam  (+)   Peds  Hematology negative hematology ROS (+)   Anesthesia Other Findings   Reproductive/Obstetrics                            Anesthesia Physical Anesthesia Plan  ASA: III  Anesthesia Plan: General   Post-op Pain Management:    Induction: Intravenous  PONV Risk Score and Plan: 3 and Ondansetron, Dexamethasone and Midazolam  Airway Management Planned: Oral ETT  Additional Equipment: None  Intra-op Plan:   Post-operative Plan: Extubation in OR  Informed Consent: I have reviewed the patients History and Physical, chart, labs and discussed the procedure including the risks, benefits and alternatives for the proposed anesthesia with the patient or authorized representative who has indicated his/her understanding and acceptance.     Dental advisory given  Plan Discussed with: CRNA  Anesthesia Plan Comments:        Anesthesia Quick Evaluation

## 2020-02-17 ENCOUNTER — Encounter (HOSPITAL_COMMUNITY): Payer: Self-pay | Admitting: Urology

## 2020-02-17 ENCOUNTER — Inpatient Hospital Stay (HOSPITAL_COMMUNITY): Payer: Medicare Other | Admitting: Physician Assistant

## 2020-02-17 ENCOUNTER — Encounter (HOSPITAL_COMMUNITY)
Admission: RE | Disposition: A | Discharge status: Home / Self Care | Payer: Self-pay | Source: Ambulatory Visit | Attending: Urology

## 2020-02-17 ENCOUNTER — Inpatient Hospital Stay (HOSPITAL_COMMUNITY): Payer: Medicare Other | Admitting: Anesthesiology

## 2020-02-17 ENCOUNTER — Other Ambulatory Visit: Payer: Self-pay

## 2020-02-17 ENCOUNTER — Inpatient Hospital Stay (HOSPITAL_COMMUNITY)
Admission: RE | Admit: 2020-02-17 | Discharge: 2020-02-18 | DRG: 614 | Disposition: A | Discharge status: Home / Self Care | Payer: Medicare Other | Source: Ambulatory Visit | Attending: Urology | Admitting: Urology

## 2020-02-17 DIAGNOSIS — C3492 Malignant neoplasm of unspecified part of left bronchus or lung: Secondary | ICD-10-CM

## 2020-02-17 DIAGNOSIS — J449 Chronic obstructive pulmonary disease, unspecified: Secondary | ICD-10-CM

## 2020-02-17 DIAGNOSIS — C7972 Secondary malignant neoplasm of left adrenal gland: Secondary | ICD-10-CM | POA: Diagnosis present

## 2020-02-17 DIAGNOSIS — Z8547 Personal history of malignant neoplasm of testis: Secondary | ICD-10-CM | POA: Diagnosis not present

## 2020-02-17 DIAGNOSIS — Z8043 Family history of malignant neoplasm of testis: Secondary | ICD-10-CM | POA: Diagnosis not present

## 2020-02-17 DIAGNOSIS — E278 Other specified disorders of adrenal gland: Secondary | ICD-10-CM | POA: Diagnosis present

## 2020-02-17 DIAGNOSIS — Z85118 Personal history of other malignant neoplasm of bronchus and lung: Secondary | ICD-10-CM

## 2020-02-17 DIAGNOSIS — Z923 Personal history of irradiation: Secondary | ICD-10-CM | POA: Diagnosis not present

## 2020-02-17 DIAGNOSIS — Z905 Acquired absence of kidney: Secondary | ICD-10-CM | POA: Diagnosis not present

## 2020-02-17 DIAGNOSIS — Z9079 Acquired absence of other genital organ(s): Secondary | ICD-10-CM | POA: Diagnosis not present

## 2020-02-17 DIAGNOSIS — Z9221 Personal history of antineoplastic chemotherapy: Secondary | ICD-10-CM

## 2020-02-17 DIAGNOSIS — Z888 Allergy status to other drugs, medicaments and biological substances status: Secondary | ICD-10-CM

## 2020-02-17 DIAGNOSIS — Z79891 Long term (current) use of opiate analgesic: Secondary | ICD-10-CM | POA: Diagnosis not present

## 2020-02-17 DIAGNOSIS — Z20822 Contact with and (suspected) exposure to covid-19: Secondary | ICD-10-CM | POA: Diagnosis present

## 2020-02-17 DIAGNOSIS — Z79899 Other long term (current) drug therapy: Secondary | ICD-10-CM | POA: Diagnosis not present

## 2020-02-17 DIAGNOSIS — Z981 Arthrodesis status: Secondary | ICD-10-CM

## 2020-02-17 DIAGNOSIS — C642 Malignant neoplasm of left kidney, except renal pelvis: Secondary | ICD-10-CM | POA: Diagnosis present

## 2020-02-17 DIAGNOSIS — K660 Peritoneal adhesions (postprocedural) (postinfection): Secondary | ICD-10-CM | POA: Diagnosis present

## 2020-02-17 DIAGNOSIS — Z87891 Personal history of nicotine dependence: Secondary | ICD-10-CM | POA: Diagnosis not present

## 2020-02-17 HISTORY — PX: ROBOTIC ADRENALECTOMY: SHX6407

## 2020-02-17 LAB — HEMOGLOBIN AND HEMATOCRIT, BLOOD
HCT: 40.3 % (ref 39.0–52.0)
Hemoglobin: 12.4 g/dL — ABNORMAL LOW (ref 13.0–17.0)

## 2020-02-17 LAB — TYPE AND SCREEN
ABO/RH(D): O NEG
Antibody Screen: NEGATIVE

## 2020-02-17 SURGERY — ADRENALECTOMY, ROBOT-ASSISTED
Anesthesia: General | Laterality: Left

## 2020-02-17 MED ORDER — LACTATED RINGERS IR SOLN
Status: DC | PRN
  Administered 2020-02-17: 1000 mL

## 2020-02-17 MED ORDER — BUPIVACAINE LIPOSOME 1.3 % IJ SUSP
20.0000 mL | Freq: Once | INTRAMUSCULAR | Status: AC
  Administered 2020-02-17: 20 mL
  Filled 2020-02-17: qty 20

## 2020-02-17 MED ORDER — HYDROMORPHONE HCL 1 MG/ML IJ SOLN
INTRAMUSCULAR | Status: AC
  Filled 2020-02-17: qty 1

## 2020-02-17 MED ORDER — BUPIVACAINE-EPINEPHRINE (PF) 0.5% -1:200000 IJ SOLN
INTRAMUSCULAR | Status: DC | PRN
  Administered 2020-02-17: 28 mL

## 2020-02-17 MED ORDER — CHLORHEXIDINE GLUCONATE 0.12 % MT SOLN
15.0000 mL | Freq: Once | OROMUCOSAL | Status: AC
  Administered 2020-02-17: 15 mL via OROMUCOSAL

## 2020-02-17 MED ORDER — DEXAMETHASONE SODIUM PHOSPHATE 10 MG/ML IJ SOLN
INTRAMUSCULAR | Status: DC | PRN
  Administered 2020-02-17: 5 mg via INTRAVENOUS

## 2020-02-17 MED ORDER — KETOROLAC TROMETHAMINE 15 MG/ML IJ SOLN: 15.0000 mg | Freq: Four times a day (QID) | INTRAMUSCULAR | Status: DC | PRN

## 2020-02-17 MED ORDER — ROCURONIUM BROMIDE 10 MG/ML (PF) SYRINGE
PREFILLED_SYRINGE | INTRAVENOUS | Status: DC | PRN
  Administered 2020-02-17: 40 mg via INTRAVENOUS
  Administered 2020-02-17: 60 mg via INTRAVENOUS

## 2020-02-17 MED ORDER — SUGAMMADEX SODIUM 200 MG/2ML IV SOLN
INTRAVENOUS | Status: DC | PRN
  Administered 2020-02-17: 200 mg via INTRAVENOUS

## 2020-02-17 MED ORDER — ONDANSETRON HCL 4 MG/2ML IJ SOLN
INTRAMUSCULAR | Status: DC | PRN
  Administered 2020-02-17: 4 mg via INTRAVENOUS

## 2020-02-17 MED ORDER — PROPOFOL 10 MG/ML IV BOLUS
INTRAVENOUS | Status: DC | PRN
  Administered 2020-02-17: 140 mg via INTRAVENOUS

## 2020-02-17 MED ORDER — OXYCODONE HCL 10 MG PO TABS: 10.0000 mg | ORAL_TABLET | Freq: Every day | ORAL | 0 refills | Status: AC

## 2020-02-17 MED ORDER — CHLORHEXIDINE GLUCONATE CLOTH 2 % EX PADS: 6.0000 | MEDICATED_PAD | Freq: Every day | CUTANEOUS | Status: DC

## 2020-02-17 MED ORDER — DEXTROSE-NACL 5-0.45 % IV SOLN: INTRAVENOUS | Status: DC

## 2020-02-17 MED ORDER — PROPOFOL 10 MG/ML IV BOLUS
INTRAVENOUS | Status: AC
  Filled 2020-02-17: qty 20

## 2020-02-17 MED ORDER — BACITRACIN-NEOMYCIN-POLYMYXIN 400-5-5000 EX OINT: 1.0000 "application " | TOPICAL_OINTMENT | Freq: Three times a day (TID) | CUTANEOUS | Status: DC | PRN

## 2020-02-17 MED ORDER — HYDROMORPHONE HCL 1 MG/ML IJ SOLN
0.2500 mg | INTRAMUSCULAR | Status: DC | PRN
  Administered 2020-02-17 (×3): 0.5 mg via INTRAVENOUS

## 2020-02-17 MED ORDER — ORAL CARE MOUTH RINSE: 15.0000 mL | Freq: Once | OROMUCOSAL | Status: AC

## 2020-02-17 MED ORDER — FENTANYL CITRATE (PF) 100 MCG/2ML IJ SOLN
INTRAMUSCULAR | Status: DC | PRN
  Administered 2020-02-17 (×2): 50 ug via INTRAVENOUS
  Administered 2020-02-17: 100 ug via INTRAVENOUS
  Administered 2020-02-17: 50 ug via INTRAVENOUS

## 2020-02-17 MED ORDER — LACTATED RINGERS IV SOLN: INTRAVENOUS | Status: DC | PRN

## 2020-02-17 MED ORDER — LIDOCAINE 2% (20 MG/ML) 5 ML SYRINGE
INTRAMUSCULAR | Status: DC | PRN
  Administered 2020-02-17: 60 mg via INTRAVENOUS

## 2020-02-17 MED ORDER — BUPIVACAINE-EPINEPHRINE (PF) 0.5% -1:200000 IJ SOLN
INTRAMUSCULAR | Status: AC
  Filled 2020-02-17: qty 30

## 2020-02-17 MED ORDER — CEFAZOLIN SODIUM-DEXTROSE 2-4 GM/100ML-% IV SOLN
2.0000 g | INTRAVENOUS | Status: AC
  Administered 2020-02-17: 2 g via INTRAVENOUS
  Filled 2020-02-17: qty 100

## 2020-02-17 MED ORDER — DIPHENHYDRAMINE HCL 12.5 MG/5ML PO ELIX: 12.5000 mg | ORAL_SOLUTION | Freq: Four times a day (QID) | ORAL | Status: DC | PRN

## 2020-02-17 MED ORDER — HYDROMORPHONE HCL 1 MG/ML IJ SOLN
0.5000 mg | INTRAMUSCULAR | Status: DC | PRN
  Administered 2020-02-17 (×2): 0.5 mg via INTRAVENOUS
  Filled 2020-02-17 (×2): qty 1

## 2020-02-17 MED ORDER — PROMETHAZINE HCL 25 MG/ML IJ SOLN
6.2500 mg | INTRAMUSCULAR | Status: DC | PRN
Start: 2020-02-17 — End: 2020-02-17

## 2020-02-17 MED ORDER — OXYCODONE HCL 5 MG PO TABS
10.0000 mg | ORAL_TABLET | Freq: Four times a day (QID) | ORAL | Status: DC | PRN
  Administered 2020-02-18 (×3): 10 mg via ORAL
  Filled 2020-02-17 (×3): qty 2

## 2020-02-17 MED ORDER — BELLADONNA ALKALOIDS-OPIUM 16.2-60 MG RE SUPP
1.0000 | Freq: Four times a day (QID) | RECTAL | Status: DC | PRN
Start: 2020-02-17 — End: 2020-02-18

## 2020-02-17 MED ORDER — FENTANYL CITRATE (PF) 250 MCG/5ML IJ SOLN
INTRAMUSCULAR | Status: AC
  Filled 2020-02-17: qty 5

## 2020-02-17 MED ORDER — ACETAMINOPHEN 10 MG/ML IV SOLN
1000.0000 mg | Freq: Four times a day (QID) | INTRAVENOUS | Status: DC
  Administered 2020-02-17 – 2020-02-18 (×3): 1000 mg via INTRAVENOUS
  Filled 2020-02-17 (×4): qty 100

## 2020-02-17 MED ORDER — SENNOSIDES-DOCUSATE SODIUM 8.6-50 MG PO TABS
2.0000 | ORAL_TABLET | Freq: Every day | ORAL | Status: DC
  Administered 2020-02-17: 2 via ORAL
  Filled 2020-02-17: qty 2

## 2020-02-17 MED ORDER — DIPHENHYDRAMINE HCL 50 MG/ML IJ SOLN: 12.5000 mg | Freq: Four times a day (QID) | INTRAMUSCULAR | Status: DC | PRN

## 2020-02-17 MED ORDER — ONDANSETRON HCL 4 MG/2ML IJ SOLN: 4.0000 mg | INTRAMUSCULAR | Status: DC | PRN

## 2020-02-17 MED ORDER — PHENYLEPHRINE 40 MCG/ML (10ML) SYRINGE FOR IV PUSH (FOR BLOOD PRESSURE SUPPORT)
PREFILLED_SYRINGE | INTRAVENOUS | Status: DC | PRN
  Administered 2020-02-17: 80 ug via INTRAVENOUS

## 2020-02-17 MED ORDER — LACTATED RINGERS IV SOLN: INTRAVENOUS | Status: DC

## 2020-02-17 MED ORDER — ACETAMINOPHEN 325 MG PO TABS: 650.0000 mg | ORAL_TABLET | ORAL | Status: DC | PRN

## 2020-02-17 MED ORDER — ACETAMINOPHEN 10 MG/ML IV SOLN: 1000.0000 mg | Freq: Once | INTRAVENOUS | Status: DC | PRN

## 2020-02-17 MED ORDER — MEPERIDINE HCL 50 MG/ML IJ SOLN: 6.2500 mg | INTRAMUSCULAR | Status: DC | PRN

## 2020-02-17 MED ORDER — STERILE WATER FOR IRRIGATION IR SOLN
Status: DC | PRN
  Administered 2020-02-17: 1000 mL

## 2020-02-17 SURGICAL SUPPLY — 61 items
ADH SKN CLS APL DERMABOND .7 (GAUZE/BANDAGES/DRESSINGS) ×1
APL PRP STRL LF DISP 70% ISPRP (MISCELLANEOUS) ×1
BAG LAPAROSCOPIC 12 15 PORT 16 (BASKET) ×1 IMPLANT
BAG RETRIEVAL 10 (BASKET) ×1
BAG RETRIEVAL 12/15 (BASKET)
CHLORAPREP W/TINT 26 (MISCELLANEOUS) ×2 IMPLANT
CLIP VESOLOCK LG 6/CT PURPLE (CLIP) ×2 IMPLANT
CLIP VESOLOCK MED LG 6/CT (CLIP) ×2 IMPLANT
CLIP VESOLOCK XL 6/CT (CLIP) ×2 IMPLANT
COVER SURGICAL LIGHT HANDLE (MISCELLANEOUS) ×2 IMPLANT
COVER TIP SHEARS 8 DVNC (MISCELLANEOUS) ×1 IMPLANT
COVER TIP SHEARS 8MM DA VINCI (MISCELLANEOUS) ×2
COVER WAND RF STERILE (DRAPES) ×2 IMPLANT
CUTTER ECHEON FLEX ENDO 45 340 (ENDOMECHANICALS) IMPLANT
DECANTER SPIKE VIAL GLASS SM (MISCELLANEOUS) ×2 IMPLANT
DERMABOND ADVANCED (GAUZE/BANDAGES/DRESSINGS) ×1
DERMABOND ADVANCED .7 DNX12 (GAUZE/BANDAGES/DRESSINGS) ×2 IMPLANT
DRAIN CHANNEL 15F RND FF 3/16 (WOUND CARE) IMPLANT
DRAPE ARM DVNC X/XI (DISPOSABLE) ×4 IMPLANT
DRAPE COLUMN DVNC XI (DISPOSABLE) ×1 IMPLANT
DRAPE DA VINCI XI ARM (DISPOSABLE) ×8
DRAPE DA VINCI XI COLUMN (DISPOSABLE) ×2
DRAPE INCISE IOBAN 66X45 STRL (DRAPES) ×2 IMPLANT
DRAPE SHEET LG 3/4 BI-LAMINATE (DRAPES) ×2 IMPLANT
ELECT PENCIL ROCKER SW 15FT (MISCELLANEOUS) ×1 IMPLANT
ELECT REM PT RETURN 15FT ADLT (MISCELLANEOUS) ×2 IMPLANT
EVACUATOR SILICONE 100CC (DRAIN) IMPLANT
GLOVE SURG ENC MOIS LTX SZ6.5 (GLOVE) ×2 IMPLANT
GLOVE SURG ENC TEXT LTX SZ7.5 (GLOVE) ×4 IMPLANT
GOWN STRL REUS W/TWL LRG LVL3 (GOWN DISPOSABLE) ×6 IMPLANT
HEMOSTAT SURGICEL 4X8 (HEMOSTASIS) ×1 IMPLANT
IRRIG SUCT STRYKERFLOW 2 WTIP (MISCELLANEOUS) ×2
IRRIGATION SUCT STRKRFLW 2 WTP (MISCELLANEOUS) ×1 IMPLANT
KIT BASIN OR (CUSTOM PROCEDURE TRAY) ×2 IMPLANT
KIT TURNOVER KIT A (KITS) ×2 IMPLANT
LOOP VESSEL MAXI BLUE (MISCELLANEOUS) ×1 IMPLANT
PENCIL SMOKE EVACUATOR (MISCELLANEOUS) IMPLANT
PROTECTOR NERVE ULNAR (MISCELLANEOUS) ×4 IMPLANT
RELOAD STAPLE 45 2.6 WHT THIN (STAPLE) IMPLANT
SEAL CANN UNIV 5-8 DVNC XI (MISCELLANEOUS) ×4 IMPLANT
SEAL XI 5MM-8MM UNIVERSAL (MISCELLANEOUS) ×8
SET TUBE SMOKE EVAC HIGH FLOW (TUBING) ×2 IMPLANT
SOLUTION ELECTROLUBE (MISCELLANEOUS) ×2 IMPLANT
SPONGE LAP 4X18 RFD (DISPOSABLE) ×2 IMPLANT
STAPLE RELOAD 45 WHT (STAPLE) IMPLANT
STAPLE RELOAD 45MM WHITE (STAPLE)
SUT ETHILON 3 0 PS 1 (SUTURE) IMPLANT
SUT MNCRL AB 4-0 PS2 18 (SUTURE) ×4 IMPLANT
SUT VIC AB 0 CT1 27 (SUTURE) ×2
SUT VIC AB 0 CT1 27XBRD ANTBC (SUTURE) IMPLANT
SUT VICRYL 0 UR6 27IN ABS (SUTURE) ×3 IMPLANT
SYS BAG RETRIEVAL 10MM (BASKET) ×1
SYSTEM BAG RETRIEVAL 10MM (BASKET) IMPLANT
TOWEL OR 17X26 10 PK STRL BLUE (TOWEL DISPOSABLE) ×2 IMPLANT
TOWEL OR NON WOVEN STRL DISP B (DISPOSABLE) ×2 IMPLANT
TRAY FOLEY MTR SLVR 16FR STAT (SET/KITS/TRAYS/PACK) ×2 IMPLANT
TRAY LAPAROSCOPIC (CUSTOM PROCEDURE TRAY) ×2 IMPLANT
TROCAR BLADELESS OPT 5 100 (ENDOMECHANICALS) ×1 IMPLANT
TROCAR UNIVERSAL OPT 12M 100M (ENDOMECHANICALS) IMPLANT
TROCAR XCEL 12X100 BLDLESS (ENDOMECHANICALS) ×2 IMPLANT
WATER STERILE IRR 1000ML POUR (IV SOLUTION) ×2 IMPLANT

## 2020-02-17 NOTE — H&P (Signed)
64M who is here today to discuss new developement of left adrenal metastasis.   In April 2015 her remove the patient's left kidney laparoscopically for a 5.1 cm clear cell carcinoma. This was an adrenal sparing surgery. The pathology was favorable, T1b clear cell renal cell carcinoma, Fuhrman grade 2, he had negative margins. Tolerated the surgery well and had uneventful recovery. He has been following up with Oncology since that time given his other history of lung cancer. As part of the surveillance he has been obtaining periodically CT scans.   September 2020 the patient was noted to have a new left adrenal lesion. Follow-up CT scans demonstrated interval growth of this area. In September 2021 the lesion was noted to have increased to 3.2 x 2.5 cm. The following months the patient underwent a biopsy of this region which demonstrated clear cell carcinoma consistent with metastatic kidney cancer.   Patient has a history of squamous cell carcinoma of the long. He was treated with chemo and radiation. He completed all his chemotherapy treatment in 2015 and has been on surveillance since that time. There has been no evidence of any other metastatic focus.   The patient has a history of T1 left testicular seminoma, status post left orchiectomy in 2000 with peri-aortic radiation 21 days for follow-up 2550 gray. No evidence of recurrence.   In 2009 the patient underwent a ventral hernia with mesh for a approximately 5 cm supra-umbilical bulge.   The patient's past medical history is largely unremarkable aside from his cancer. Does have a history of Crohn's disease but this is been quite stable and he has not had flare of this in the last 10 years or so. He has no history of heart attack or diabetes. He does have decreased exercise tolerance. He also has a near complete cervical fusion for and has little mobility of his neck.     ALLERGIES: Benadryl CAPS    MEDICATIONS: Curcumin  Hydroxyzine Hcl   Oxycodone Hcl 10 mg tablet  Probiotic CAPS Oral  Selenium  Vitamin D3 75 mcg (3,000 unit) tablet Oral  Vitamin K2 100 mcg capsule Oral  Zinc     GU PSH: Radical nephrectomy (laparoscopic) - 2015       PSH Notes: Kidney Surgery Laparoscopic Radical Nephrectomy, Appendectomy, Hernia Repair, Surgery Testis, lung surgery 2015   NON-GU PSH: Appendectomy - 2014 Hernia Repair - 2014     GU PMH: Other Disorder Kidney/ureter, Left renal mass - 2015 Testicular Cancer, Unspec, Malignant neoplasm of testis - 2015    NON-GU PMH: Malignant neoplasm of unspecified part of unspecified bronchus or lung, Lung cancer - 2015 Encounter for general adult medical examination without abnormal findings, Encounter for preventive health examination - 2015 Personal history of other diseases of the digestive system, History of Crohn's disease - 2014    FAMILY HISTORY: 1 Daughter - Daughter 1 son - Son Asthma - Runs In Family malignant neoplasm of breast - Runs In Family No pertinent family history - Runs In Family Prostate Cancer - Runs In Family testicular cancer - Runs In Family   SOCIAL HISTORY: Marital Status: Married Current Smoking Status: Patient does not smoke anymore. Smoked for 50 years. Smoked 1 pack per day.   Tobacco Use Assessment Completed: Used Tobacco in last 30 days? Does not drink anymore.  Drinks 3 caffeinated drinks per day. 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     Notes: Occupation, Caffeine use, Two children, Current every day smoker, Alcohol use, Married  REVIEW OF SYSTEMS:    GU Review Male:   Patient denies frequent urination, hard to postpone urination, burning/ pain with urination, get up at night to urinate, leakage of urine, stream starts and stops, trouble starting your stream, have to strain to urinate , erection problems, and penile pain.  Gastrointestinal (Upper):   Patient denies nausea, vomiting, and indigestion/ heartburn.  Gastrointestinal  (Lower):   Patient denies diarrhea and constipation.  Constitutional:   Patient denies fever, night sweats, weight loss, and fatigue.  Skin:   Patient denies skin rash/ lesion and itching.  Eyes:   Patient denies blurred vision and double vision.  Ears/ Nose/ Throat:   Patient denies sore throat and sinus problems.  Hematologic/Lymphatic:   Patient denies swollen glands and easy bruising.  Cardiovascular:   Patient denies leg swelling and chest pains.  Respiratory:   Patient reports cough and shortness of breath.   Endocrine:   Patient denies excessive thirst.  Musculoskeletal:   Patient reports back pain. Patient denies joint pain.  Neurological:   Patient denies headaches and dizziness.  Psychologic:   Patient denies depression and anxiety.   VITAL SIGNS:      01/31/2020 02:48 PM  Weight 171 lb / 77.56 kg  Height 67 in / 170.18 cm  BP 104/71 mmHg  Pulse 97 /min  Temperature 97.3 F / 36.2 C  BMI 26.8 kg/m   MULTI-SYSTEM PHYSICAL EXAMINATION:    Constitutional: Well-nourished. No physical deformities. Normally developed. Good grooming.  Neck: Neck symmetrical, not swollen. Normal tracheal position.  Respiratory: Normal breath sounds. No labored breathing, no use of accessory muscles.   Cardiovascular: Regular rate and rhythm. No murmur, no gallop. Normal temperature, normal extremity pulses, no swelling, no varicosities.   Lymphatic: No enlargement of neck, axillae, groin.  Skin: No paleness, no jaundice, no cyanosis. No lesion, no ulcer, no rash.  Neurologic / Psychiatric: Oriented to time, oriented to place, oriented to person. No depression, no anxiety, no agitation.  Gastrointestinal: No mass, no tenderness, no rigidity, non obese abdomen.  Eyes: Normal conjunctivae. Normal eyelids.  Ears, Nose, Mouth, and Throat: Left ear no scars, no lesions, no masses. Right ear no scars, no lesions, no masses. Nose no scars, no lesions, no masses. Normal hearing. Normal lips.   Musculoskeletal: Normal gait and station of head and neck.     Complexity of Data:  Source Of History:  Patient  Records Review:   Previous Doctor Records, Previous Patient Records  Urine Test Review:   Urinalysis  X-Ray Review: C.T. Abdomen/Pelvis: Reviewed Films. Discussed With Patient.     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  Chantix 1 mg tablet Oral  Start: 03/15/2013  Discontinue: 01/31/2020  - Reason: The medication cycle was completed.  Hydromorphone Hcl 2 mg tablet Oral  Start: 05/03/2013  Discontinue: 01/31/2020  - Reason: The medication cycle was completed.  Resveratrol 50 mg capsule Oral  Start: 12/13/2012  Discontinue: 01/31/2020  - Reason: The medication cycle was completed.            Document Letter(s):  Created for Patient: Clinical Summary         Notes:   The patient has what appears to be metastatic kidney cancer with a localized oligo metastatic focus in his left adrenal gland, biospied in 10/21. His lung cancer appears to be in remission having not had any recurrence since treatment 2015.   I reviewed the patient's CT scan and discussed findings of the biopsy with he and his wife. Have gone through the recommended treatment of oligo metastatic kidney cancer being primary  surgical resection. I have offered him a left adrenalectomy as the recommended therapy. We discussed the surgery in detail really risks and benefits. We primarily discussed performing this robotically which I think would be easier than attempting to do a laparoscopic Lea. Given his history of mesh and his recent history of nephrectomy, he likely does have some adhesions that will require delicate attention. However, I do think that this is a plausible and reasonable approach.   The patient has agreed to proceed with the recommended treatment. Hopefully we can get this done in a reasonable time.

## 2020-02-17 NOTE — Transfer of Care (Signed)
Immediate Anesthesia Transfer of Care Note  Patient: [name]  Procedure(s) Performed: XI ROBOTIC LEFT ADRENALECTOMY (Left )  Patient Location: PACU  Anesthesia Type:General  Level of Consciousness: sedated, patient cooperative and responds to stimulation  Airway & Oxygen Therapy: Patient Spontanous Breathing and Patient connected to face mask oxygen  Post-op Assessment: Report given to RN and Post -op Vital signs reviewed and stable  Post vital signs: Reviewed and stable  Last Vitals:  Vitals Value Taken Time  BP 119/83 02/17/20 1107  Temp    Pulse 73 02/17/20 1110  Resp 14 02/17/20 1110  SpO2 100 % 02/17/20 1110  Vitals shown include unvalidated device data.  Last Pain:  Vitals:   02/17/20 0548  TempSrc:   PainSc: 0-No pain         Complications: No complications documented.

## 2020-02-17 NOTE — Anesthesia Postprocedure Evaluation (Signed)
Anesthesia Post Note  Patient: [name]  Procedure(s) Performed: XI ROBOTIC LEFT ADRENALECTOMY (Left )     Patient location during evaluation: PACU Anesthesia Type: General Level of consciousness: sedated Pain management: pain level controlled Vital Signs Assessment: post-procedure vital signs reviewed and stable Respiratory status: spontaneous breathing Cardiovascular status: stable Postop Assessment: no apparent nausea or vomiting Anesthetic complications: no   No complications documented.  Last Vitals:  Vitals:   02/17/20 1130 02/17/20 1145  BP: 138/72 (!) 148/75  Pulse: 69 67  Resp: 11 11  Temp:    SpO2: 100% 100%    Last Pain:  Vitals:   02/17/20 1145  TempSrc:   PainSc: [name]

## 2020-02-17 NOTE — Interval H&P Note (Signed)
History and Physical Interval Note:  02/17/2020 6:46 AM  [name]  has presented today for surgery, with the diagnosis of LEFT ADRENAL METASTASIS.  The various methods of treatment have been discussed with the patient and family. After consideration of risks, benefits and other options for treatment, the patient has consented to  Procedure(s) with comments: XI ROBOTIC LEFT ADRENALECTOMY (Left) - REQUESTING 4HRS as a surgical intervention.  The patient's history has been reviewed, patient examined, no change in status, stable for surgery.  I have reviewed the patient's chart and labs.  Questions were answered to the patient's satisfaction.     [name]

## 2020-02-17 NOTE — Discharge Instructions (Signed)
1.  Activity:  You are encouraged to ambulate frequently (about every hour during waking hours) to help prevent blood clots from forming in your legs or lungs.  However, you should not engage in any heavy lifting (> 10-15 lbs), strenuous activity, or straining. 2. Diet: You should advance your diet as instructed by your physician.  It will be normal to have some bloating, nausea, and abdominal discomfort intermittently. 3. Prescriptions:  You will be provided a prescription for pain medication to take as needed.  If your pain is not severe enough to require the prescription pain medication, you may take extra strength Tylenol instead which will have less side effects.  You should also take a prescribed stool softener to avoid straining with bowel movements as the prescription pain medication may constipate you. 4. Incisions: You may remove your dressing bandages 48 hours after surgery if not removed in the hospital.  You will either have some small staples or special tissue glue at each of the incision sites. Once the bandages are removed (if present), the incisions may stay open to air.  You may start showering (but not soaking or bathing in water) the 2nd day after surgery and the incisions simply need to be patted dry after the shower.  No additional care is needed. 5. What to call us about: You should call the office [phone]) if you develop fever > 101 or develop persistent vomiting.   You can resume aspirin, advil, aleve, vitamins, and supplements 7 days after surgery.

## 2020-02-17 NOTE — Anesthesia Procedure Notes (Signed)
Procedure Name: Intubation Performed by: Gean Maidens, CRNA Pre-anesthesia Checklist: Patient identified, Emergency Drugs available, Suction available, Patient being monitored and Timeout performed Patient Re-evaluated:Patient Re-evaluated prior to induction Oxygen Delivery Method: Circle system utilized Preoxygenation: Pre-oxygenation with 100% oxygen Induction Type: IV induction Ventilation: Mask ventilation without difficulty Laryngoscope Size: Glidescope and 4 Grade View: Grade I Tube type: Oral Tube size: 7.5 mm Number of attempts: 1 Airway Equipment and Method: Video-laryngoscopy and Stylet Placement Confirmation: ETT inserted through vocal cords under direct vision,  positive ETCO2 and breath sounds checked- equal and bilateral Secured at: 23 cm Tube secured with: Tape Dental Injury: Teeth and Oropharynx as per pre-operative assessment  Difficulty Due To: Difficult Airway- due to reduced neck mobility

## 2020-02-17 NOTE — Op Note (Signed)
Preoperative diagnosis:  1. 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 same   Procedure: Robotic assisted laparoscopic left adrenalectomy and metastasectomy  Surgeon: [name], MD 1st assistant: Debbrah Alar  Anesthesia: General  Complications: None  Intraoperative findings:  #1.  The adrenal gland was easily located and removed fairly atraumatically with minimal disruption of the surrounding tissues and little blood loss.  EBL: 25 cc  Specimens: left adrenal and metastasis   Indication: [name] is a 70 y.o. patient with history of kidney cancer and 5 years ago underwent a left nephrectomy.  He had been on surveillance for that and a CT scan demonstrated a new lesion in the left adrenal gland.  This was biopsied and demonstrated renal cell carcinoma.  As such he presented to me and we opted to proceed with removal of the adrenal gland.  After reviewing the management options for treatment, he elected to proceed with the above surgical procedure(s). We have discussed the potential benefits and risks of the procedure, side effects of the proposed treatment, the likelihood of the patient achieving the goals of the procedure, and any potential problems that might occur during the procedure or recuperation. Informed consent has been obtained.  Description of procedure:  The patient was taken to the operating room and a general anesthetic was administered. The patient was given preoperative antibiotics, placed in the right modified flank position with care to pad all potential pressure points, and prepped and draped in the usual sterile fashion. Next a preoperative timeout was performed.  A site was selected on the left side of the umbilicus for placement of the camera port. This was placed using a standard modified Hassan technique with entry into the peritoneum with a  8 mm tocar. We entered the peritoneum without incident and  established pneumoperitoneum. The camera was then used to inspect the abdomen and there was no evidence of any intra-abdominal injuries or other abnormalities. The remaining abdominal ports were then placed. 8 mm robotic ports were placed in the left upper quadrant, left lower quadrant, and left lateral abdominal wall, making a soft J. A 12 mm port was placed in the upper midline for laparoscopic assistance. All ports were placed under direct vision without difficulty. The surgical cart was then docked.   Utilizing the cautery scissors, mesenteric adhesions were taken down along the sidewall and moved medially.  The spleen was also excised from the sidewall in the mesentery.  Once the mesentery had been reflected medial enough the adrenal gland and mass was easily visualized.  I then slowly worked around the edges of the mass taking care to ensure that the pancreas had been reflected medial and inferiorly.  On the medial and inferior aspect of the mass the stapled renal hilum became apparent and there was some adhesions of the adrenal gland to the staple line.  I was able to carefully dissect the staple line off of the mass.  The blood supply to the adrenal gland was taken with the bipolar.  The adrenal vein was clipped twice prior to transecting it.  The mass in the adrenal gland came out en bloc and was placed in a specimen bag.  The resection site was then inspected for any bleeding and hemostasis was quickly obtained.  I reinspected the spleen as well to ensure that there was no bleeding or splenic injury from retraction.  An additional hemostatic agent (Surgiflo) was then placed into the renal defect.  The surgical robotic cart was undocked. The specimen was removed intact within an endopouch retrieval bag via the assistant port site. The camera port site and the other 12 mm port site were then closed at the fascial level with 0-vicryl suture. All other laparoscopic/robotic ports were removed under  direct vision and the pneumoperitoneum let down with inspection of the operative field performed and hemostasis again confirmed. All incision sites were then injected with local anesthetic and reapproximated at the skin level with 4-0 monocryl subcuticular closures. Dermabond was applied to the skin. The patient tolerated the procedure well and without complications. The patient was able to be extubated and transferred to the recovery unit in satisfactory condition.  [name], M.D.

## 2020-02-18 ENCOUNTER — Encounter (HOSPITAL_COMMUNITY): Payer: Self-pay | Admitting: Urology

## 2020-02-18 LAB — BASIC METABOLIC PANEL
Anion gap: 7 (ref 5–15)
BUN: 16 mg/dL (ref 8–23)
CO2: 27 mmol/L (ref 22–32)
Calcium: 8.9 mg/dL (ref 8.9–10.3)
Chloride: 104 mmol/L (ref 98–111)
Creatinine, Ser: 0.96 mg/dL (ref 0.61–1.24)
GFR, Estimated: >60 mL/min (ref >60)
Glucose, Bld: 115 mg/dL — ABNORMAL HIGH (ref 70–99)
Potassium: 4.3 mmol/L (ref 3.5–5.1)
Sodium: 138 mmol/L (ref 135–145)

## 2020-02-18 LAB — HEMOGLOBIN AND HEMATOCRIT, BLOOD
HCT: 42.1 % (ref 39.0–52.0)
Hemoglobin: 13.2 g/dL (ref 13.0–17.0)

## 2020-02-18 MED ORDER — ONDANSETRON HCL 4 MG PO TABS: 4.0000 mg | ORAL_TABLET | Freq: Every day | ORAL | 1 refills | Status: AC | PRN

## 2020-02-18 NOTE — Discharge Summary (Signed)
Date of admission: 02/17/2020  Date of discharge: 02/18/2020  Admission diagnosis: Metastatic renal cell carcinoma  Discharge diagnosis: Metastatic renal cell carcinoma  Procedures: Robot-assisted laparoscopic left adrenalectomy and metastatectomy with Dr. Louis Meckel  History and Physical: For full details, please see admission history and physical. Briefly, [name] is a 70 y.o. year old patient with a history of left-sided renal cell carcinoma, status post left nephrectomy approximately 5 years ago.  He was subsequently found to have a metastatic lesion adjacent to the left adrenal gland that was biopsy confirmed RCC.  He underwent a robot-assisted laparoscopic left adrenalectomy and metastatectomy with Dr. Louis Meckel on 02/17/2020.  Hospital Course: Postoperatively, the patient was monitored on the floor.  On postop day one, the patient was ambulating without difficulty, tolerating a regular diet, passing flatus and his pain is controlled.  Physical Exam:  General: Alert and oriented CV: RRR, palpable distal pulses Lungs: CTAB, equal chest rise Abdomen: Soft, NTND, no rebound or guarding Incisions: Clean, dry and intact Ext: NT, No erythema  Laboratory values: Recent Labs    02/17/20 1127 02/18/20 0615  HGB 12.4* 13.2  HCT 40.3 42.1   Recent Labs    02/18/20 0615  CREATININE 0.96    Disposition: Home  Discharge instruction: The patient was instructed to be ambulatory but told to refrain from heavy lifting, strenuous activity, or driving.  Discharge medications:  Allergies as of 02/18/2020      Reactions   Ativan [lorazepam] Other (See Comments)   Wife states " he became severally confused and combative"       Medication List    STOP taking these medications   D3 + K2 DOTS PO   OVER THE COUNTER MEDICATION   PROBIOTIC DAILY PO   TURMERIC CURCUMIN PO     TAKE these medications   hydrOXYzine 50 MG tablet Commonly known as: ATARAX/VISTARIL Take 12.5 mg by mouth  at bedtime as needed (for sleep.).   ondansetron 4 MG tablet Commonly known as: Zofran Take 1 tablet (4 mg total) by mouth daily as needed for nausea or vomiting.   Oxycodone HCl 10 MG Tabs Take 1 tablet (10 mg total) by mouth 5 (five) times daily.       Followup:   Follow-up Information    Karen Kays, NP On 03/02/2020.   Specialty: Nurse Practitioner Why: at 3:00 Contact information: [address] [place] [address] [phone]

## 2020-02-18 NOTE — Progress Notes (Signed)
Pt given and explained discharge instructions. All questions answered. IVs removed. Pt dressed in personal clothing. Pt taken to main entrance via wheelchair with all belongings.

## 2020-02-20 LAB — SURGICAL PATHOLOGY

## 2021-06-18 ENCOUNTER — Telehealth: Payer: Self-pay | Admitting: *Deleted

## 2021-06-18 NOTE — Telephone Encounter (Signed)
This patient called, he has a lab & MD appointment on 06/20/21, he is asking if he needs a scan before this appointment.

## 2021-06-19 ENCOUNTER — Other Ambulatory Visit: Payer: Self-pay | Admitting: Medical Oncology

## 2021-06-19 ENCOUNTER — Telehealth: Payer: Self-pay | Admitting: Physician Assistant

## 2021-06-19 ENCOUNTER — Encounter: Payer: Self-pay | Admitting: *Deleted

## 2021-06-19 ENCOUNTER — Other Ambulatory Visit: Payer: Self-pay | Admitting: Physician Assistant

## 2021-06-19 DIAGNOSIS — C642 Malignant neoplasm of left kidney, except renal pelvis: Secondary | ICD-10-CM

## 2021-06-19 DIAGNOSIS — C3492 Malignant neoplasm of unspecified part of left bronchus or lung: Secondary | ICD-10-CM

## 2021-06-19 NOTE — Progress Notes (Signed)
Per Cassie, I completed a scheduling message to re-schedule [name] appt to be seen with Dr. Julien Nordmann in 2 weeks.

## 2021-06-19 NOTE — Progress Notes (Signed)
Patient called me to check on a pet scan. I checked and this scan was not ordered.  Patient is a kidney cancer patient so I am not familiar with [name] needs. I sent Dr. Julien Nordmann a message to help clarify issues. Patient updated.

## 2021-06-19 NOTE — Telephone Encounter (Signed)
The patient called the clinic to clarify if he needs imaging before his appointment with Dr. Julien Nordmann tomorrow on 06/20/2021.  The patient saw his surgeon, Dr. Hassell Done recently who recommended a PET scan.  No order was placed for a PET scan.  Therefore, Dr. Julien Nordmann recommended arranging for a CT scan of the chest, abdomen, pelvis and to reschedule his follow-up appointment for 2 weeks until after he can have the CT scan performed.  I reviewed this with the patient.  He is in agreement with the plan.

## 2021-06-20 ENCOUNTER — Inpatient Hospital Stay: Payer: Medicare Other | Admitting: Internal Medicine

## 2021-06-20 ENCOUNTER — Inpatient Hospital Stay: Payer: Medicare Other

## 2021-06-20 ENCOUNTER — Other Ambulatory Visit: Payer: Self-pay | Admitting: Surgery

## 2021-06-20 ENCOUNTER — Other Ambulatory Visit (HOSPITAL_COMMUNITY): Payer: Self-pay | Admitting: Surgery

## 2021-06-20 DIAGNOSIS — R222 Localized swelling, mass and lump, trunk: Secondary | ICD-10-CM

## 2021-06-20 DIAGNOSIS — Z85528 Personal history of other malignant neoplasm of kidney: Secondary | ICD-10-CM

## 2021-06-20 DIAGNOSIS — Z85118 Personal history of other malignant neoplasm of bronchus and lung: Secondary | ICD-10-CM

## 2021-06-21 ENCOUNTER — Telehealth: Payer: Self-pay | Admitting: Internal Medicine

## 2021-06-21 NOTE — Telephone Encounter (Signed)
.  Called patient to schedule appointment per 6/16 inbasket,  left pt msg

## 2021-06-26 ENCOUNTER — Inpatient Hospital Stay: Payer: Medicare Other | Attending: Internal Medicine

## 2021-06-26 ENCOUNTER — Other Ambulatory Visit: Payer: Self-pay

## 2021-06-26 ENCOUNTER — Ambulatory Visit (HOSPITAL_COMMUNITY)
Admission: RE | Admit: 2021-06-26 | Discharge: 2021-06-26 | Disposition: A | Discharge status: Home / Self Care | Payer: Medicare Other | Source: Ambulatory Visit | Attending: Physician Assistant | Admitting: Physician Assistant

## 2021-06-26 ENCOUNTER — Ambulatory Visit (HOSPITAL_COMMUNITY)
Admission: RE | Admit: 2021-06-26 | Discharge: 2021-06-26 | Disposition: A | Discharge status: Home / Self Care | Payer: Medicare Other | Source: Ambulatory Visit | Attending: Surgery | Admitting: Surgery

## 2021-06-26 DIAGNOSIS — Z85528 Personal history of other malignant neoplasm of kidney: Secondary | ICD-10-CM | POA: Diagnosis present

## 2021-06-26 DIAGNOSIS — R222 Localized swelling, mass and lump, trunk: Secondary | ICD-10-CM

## 2021-06-26 DIAGNOSIS — Z85118 Personal history of other malignant neoplasm of bronchus and lung: Secondary | ICD-10-CM | POA: Insufficient documentation

## 2021-06-26 DIAGNOSIS — C642 Malignant neoplasm of left kidney, except renal pelvis: Secondary | ICD-10-CM

## 2021-06-26 DIAGNOSIS — C3492 Malignant neoplasm of unspecified part of left bronchus or lung: Secondary | ICD-10-CM | POA: Insufficient documentation

## 2021-06-26 DIAGNOSIS — Z923 Personal history of irradiation: Secondary | ICD-10-CM | POA: Insufficient documentation

## 2021-06-26 DIAGNOSIS — Z9221 Personal history of antineoplastic chemotherapy: Secondary | ICD-10-CM | POA: Insufficient documentation

## 2021-06-26 LAB — CMP (CANCER CENTER ONLY)
ALT: 12 U/L (ref 0–44)
AST: 16 U/L (ref 15–41)
Albumin: 3.8 g/dL (ref 3.5–5.0)
Alkaline Phosphatase: 105 U/L (ref 38–126)
Anion gap: 9 (ref 5–15)
BUN: 16 mg/dL (ref 8–23)
CO2: 28 mmol/L (ref 22–32)
Calcium: 9.1 mg/dL (ref 8.9–10.3)
Chloride: 101 mmol/L (ref 98–111)
Creatinine: 1.38 mg/dL — ABNORMAL HIGH (ref 0.61–1.24)
GFR, Estimated: 54 mL/min — ABNORMAL LOW (ref >60)
Glucose, Bld: 96 mg/dL (ref 70–99)
Potassium: 4.1 mmol/L (ref 3.5–5.1)
Sodium: 138 mmol/L (ref 135–145)
Total Bilirubin: 0.6 mg/dL (ref 0.3–1.2)
Total Protein: 7.5 g/dL (ref 6.5–8.1)

## 2021-06-26 LAB — CBC WITH DIFFERENTIAL (CANCER CENTER ONLY)
Abs Immature Granulocytes: 0.03 10*3/uL (ref 0.00–0.07)
Basophils Absolute: 0.1 10*3/uL (ref 0.0–0.1)
Basophils Relative: 1 %
Eosinophils Absolute: 0.1 10*3/uL (ref 0.0–0.5)
Eosinophils Relative: 1 %
HCT: 47 % (ref 39.0–52.0)
Hemoglobin: 15.4 g/dL (ref 13.0–17.0)
Immature Granulocytes: 0 %
Lymphocytes Relative: 18 %
Lymphs Abs: 1.4 10*3/uL (ref 0.7–4.0)
MCH: 29.1 pg (ref 26.0–34.0)
MCHC: 32.8 g/dL (ref 30.0–36.0)
MCV: 88.8 fL (ref 80.0–100.0)
Monocytes Absolute: 0.4 10*3/uL (ref 0.1–1.0)
Monocytes Relative: 6 %
Neutro Abs: 5.9 10*3/uL (ref 1.7–7.7)
Neutrophils Relative %: 74 %
Platelet Count: 295 10*3/uL (ref 150–400)
RBC: 5.29 MIL/uL (ref 4.22–5.81)
RDW: 13.9 % (ref 11.5–15.5)
WBC Count: 8 10*3/uL (ref 4.0–10.5)
nRBC: 0 % (ref 0.0–0.2)

## 2021-06-26 LAB — GLUCOSE, CAPILLARY: Glucose-Capillary: 103 mg/dL — ABNORMAL HIGH (ref 70–99)

## 2021-06-26 MED ORDER — SODIUM CHLORIDE (PF) 0.9 % IJ SOLN
INTRAMUSCULAR | Status: AC
  Filled 2021-06-26: qty 50

## 2021-06-26 MED ORDER — FLUDEOXYGLUCOSE F - 18 (FDG) INJECTION
8.2700 | Freq: Once | INTRAVENOUS | Status: AC
  Administered 2021-06-26: 8.27 via INTRAVENOUS

## 2021-06-26 MED ORDER — IOHEXOL 300 MG/ML  SOLN
100.0000 mL | Freq: Once | INTRAMUSCULAR | Status: AC | PRN
  Administered 2021-06-26: 100 mL via INTRAVENOUS

## 2021-07-03 ENCOUNTER — Inpatient Hospital Stay: Payer: Medicare Other

## 2021-07-03 ENCOUNTER — Inpatient Hospital Stay: Payer: Medicare Other | Admitting: Internal Medicine

## 2021-07-03 ENCOUNTER — Other Ambulatory Visit: Payer: Self-pay

## 2021-07-03 VITALS — BP 107/81 | HR 87 | Temp 98.1°F | Resp 18 | Ht 67.0 in | Wt 168.0 lb

## 2021-07-03 DIAGNOSIS — C3492 Malignant neoplasm of unspecified part of left bronchus or lung: Secondary | ICD-10-CM | POA: Diagnosis not present

## 2021-07-03 DIAGNOSIS — Z85118 Personal history of other malignant neoplasm of bronchus and lung: Secondary | ICD-10-CM | POA: Diagnosis present

## 2021-07-03 DIAGNOSIS — Z9221 Personal history of antineoplastic chemotherapy: Secondary | ICD-10-CM | POA: Diagnosis not present

## 2021-07-03 DIAGNOSIS — Z923 Personal history of irradiation: Secondary | ICD-10-CM | POA: Diagnosis not present

## 2021-07-03 NOTE — Progress Notes (Signed)
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 2) Status post left laparoscopic radical nephrectomy under the care of Dr. Louis Meckel on 04/04/2013.  CURRENT THERAPY: Observation.  INTERVAL HISTORY: [name] 71 y.o. male returns to the clinic today for follow-up visit accompanied by his wife.  The patient was seen recently by Dr. Johnathan Hausen for evaluation and consideration of hernia repair.  During his examination he was found to have a epigastric subcutaneous nodule.  There was concern about disease progression and the patient was referred back to me today for evaluation and recommendation regarding his condition.  When seen today he is feeling fine except for the baseline shortness of breath and mild cough with no significant chest pain or hemoptysis.  He denied having any fever or chills.  He has no nausea, vomiting, diarrhea or constipation.  He has no headache or visual changes.  He had CT scan of the chest, abdomen and pelvis as well as a recent PET scan performed and he is here for evaluation and discussion of his imaging studies and recommendation regarding his condition.  MEDICAL HISTORY: Past Medical History:  Diagnosis Date   Adrenal cancer, left ([facility])    Arthritis    Cancer of kidney ([facility])    left   Complication of anesthesia    Very little movement of neck- "self fusioning"   COPD (chronic  obstructive pulmonary disease) ([facility])    Crohn's disease ([facility])    no issues since 2012   Difficulty sleeping    occasionally   Dyspnea    with a little exertion,  "smoked cigarettes all my life"   History of blood transfusion    "chron's flare"   Lung cancer ([facility])    Renal cell carcinoma of left kidney ([facility]) 02/20/2015   Renal mass    Renal mass, left    Sleep apnea 05/2016   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  MEDICATIONS:  Current Outpatient Medications  Medication Sig Dispense Refill   hydrOXYzine (ATARAX/VISTARIL) 50 MG tablet Take 12.5 mg by mouth at bedtime as needed (for sleep.).     Oxycodone HCl 10 MG TABS Take 1 tablet (10 mg total) by mouth 5 (five) times daily. 15 tablet 0   No current facility-administered medications for this visit.    SURGICAL HISTORY:  Past Surgical History:  Procedure Laterality Date   APPENDECTOMY     COLONOSCOPY     HERNIA REPAIR     x 2   LAPAROSCOPIC NEPHRECTOMY Left 04/04/2013   Procedure: LAPAROSCOPIC LEFT RADICAL NEPHRECTOMY ;  Surgeon: [name], MD;  Location: [facility];  Service: Urology;  Laterality: Left;   LUNG BIOPSY Left 08/08/2016   Procedure: LEFT LUNG BIOPSY;  Surgeon: Grace Isaac, MD;  Location: [facility];  Service: Thoracic;  Laterality: Left;   ROBOTIC ADRENALECTOMY Left 02/17/2020  Procedure: XI ROBOTIC LEFT ADRENALECTOMY;  Surgeon: [name], MD;  Location: [facility];  Service: Urology;  Laterality: Left;  REQUESTING 4HRS   testicular removal  2000   VIDEO BRONCHOSCOPY N/A 11/30/2012   Procedure: VIDEO BRONCHOSCOPY WITH FLUORO;  Surgeon: Elsie Stain, MD;  Location: [facility];  Service: Cardiopulmonary;  Laterality: N/A;   VIDEO BRONCHOSCOPY N/A 08/08/2016   Procedure: VIDEO BRONCHOSCOPY;  Surgeon: Grace Isaac, MD;  Location: [facility];  Service: Thoracic;  Laterality: N/A;    REVIEW OF SYSTEMS:  Constitutional:  positive for fatigue Eyes: negative Ears, nose, mouth, throat, and face: negative Respiratory: positive for dyspnea on exertion Cardiovascular: negative Gastrointestinal: negative Genitourinary:negative Integument/breast: negative Hematologic/lymphatic: negative Musculoskeletal:negative Neurological: negative Behavioral/Psych: negative Endocrine: negative Allergic/Immunologic: negative   PHYSICAL EXAMINATION: General appearance: alert, cooperative, and no distress Head: Normocephalic, without obvious abnormality, atraumatic Neck: no adenopathy, no JVD, supple, symmetrical, trachea midline, and thyroid not enlarged, symmetric, no tenderness/mass/nodules Lymph nodes: Cervical, supraclavicular, and axillary nodes normal. Resp: clear to auscultation bilaterally Back: symmetric, no curvature. ROM normal. No CVA tenderness. Cardio: regular rate and rhythm, S1, S2 normal, no murmur, click, rub or gallop GI: soft, non-tender; bowel sounds normal; no masses,  no organomegaly Extremities: extremities normal, atraumatic, no cyanosis or edema Neurologic: Alert and oriented X 3, normal strength and tone. Normal symmetric reflexes. Normal coordination and gait  ECOG PERFORMANCE STATUS: 1 - Symptomatic but completely ambulatory  Blood pressure 107/81, pulse 87, temperature 98.1 F (36.7 C), temperature source Tympanic, resp. rate 18, height 5' 7"  (1.702 m), weight 168 lb (76.2 kg), SpO2 95 %.  LABORATORY DATA: Lab Results  Component Value Date   WBC 8.0 06/26/2021   HGB 15.4 06/26/2021   HCT 47.0 06/26/2021   MCV 88.8 06/26/2021   PLT 295 06/26/2021      Chemistry      Component Value Date/Time   NA 138 06/26/2021 1353   NA 142 06/30/2016 1406   K 4.1 06/26/2021 1353   K 4.8 06/30/2016 1406   CL 101 06/26/2021 1353   CO2 28 06/26/2021 1353   CO2 29 06/30/2016 1406   BUN 16 06/26/2021 1353   BUN 14.8 06/30/2016 1406   CREATININE 1.38 (H) 06/26/2021 1353   CREATININE 1.4 (H)  06/30/2016 1406      Component Value Date/Time   CALCIUM 9.1 06/26/2021 1353   CALCIUM 9.8 06/30/2016 1406   ALKPHOS 105 06/26/2021 1353   ALKPHOS 122 06/30/2016 1406   AST 16 06/26/2021 1353   AST 11 06/30/2016 1406   ALT 12 06/26/2021 1353   ALT 13 06/30/2016 1406   BILITOT 0.6 06/26/2021 1353   BILITOT 0.40 06/30/2016 1406       RADIOGRAPHIC STUDIES: NM PET Image Restage (PS) Skull Base to Thigh (F-18 FDG)  Addendum Date: 06/28/2021   ADDENDUM REPORT: 06/28/2021 09:54 ADDENDUM: As noted in the body of the report, there is a 1.6 cm superficial subcutaneous nodule in the epigastric region of the anterior midline abdominal wall with low level hypermetabolism. This appears to enhance on the CT scan performed today. Given enhancement and low level FDG uptake, correlation for metastatic disease recommended. Electronically Signed   By: Misty Stanley M.D.   On: 06/28/2021 09:54   Result Date: 06/28/2021 CLINICAL DATA:  Subsequent treatment strategy for lung cancer. EXAM: NUCLEAR MEDICINE PET SKULL BASE TO THIGH TECHNIQUE: 8.3 mCi F-18 FDG was injected intravenously. Full-ring PET imaging was performed from the skull base to thigh after the  radiotracer. CT data was obtained and used for attenuation correction and anatomic localization. Fasting blood glucose: 103 mg/dl PET-CT COMPARISON:  09/29/2020chest abdomen pelvis CT 10/03/2019 FINDINGS: Mediastinal blood pool activity: SUV max 2.8 Liver activity: SUV max NA NECK: No hypermetabolic lymph nodes in the neck. Incidental CT findings: none CHEST: Low level hypermetabolism is identified in the left parahilar scarring ( SUV max = 2.4-3.6). 16 mm lesion in the posterior right lower lobe on 85/8 is hypermetabolic with SUV max = 4.3. No hypermetabolic lymphadenopathy in the mediastinum or hilar regions. Incidental CT findings: Coronary artery calcification is evident. Mild atherosclerotic calcification is noted in the wall of the thoracic aorta. Ascending  thoracic aorta measures 4 cm diameter. Calcified nodal tissue identified right hilum. Centrilobular emphsyema noted. ABDOMEN/PELVIS: No abnormal hypermetabolic activity within the liver, pancreas, adrenal glands, or spleen. No hypermetabolic lymph nodes in the abdomen or pelvis. 1.6 cm midline subcutaneous anterior abdominal wall nodule in the epigastric region (93/215) shows low level hypermetabolism with SUV max = 2.7. This nodule showed enhancement on CT scan performed today and has increased in size from 9 mm on CT scan 05/29/2020. Incidental CT findings: Right kidney surgically absent. Ventral hernia just cranial to some mesh contains small bowel loops without complicating features. SKELETON: Small focus of mild hypermetabolism identified in the left T12 vertebral body near the costovertebral junction (image 88/4) with SUV max = 3.6. There is some subtle sclerosis in this region on CT imaging. Incidental CT findings: No acute findings. IMPRESSION: 1. Enlarging 16 mm right lower lobe sub solid nodule is hypermetabolic. This nodule has been increasing in size over previous 2 CT exams. Imaging features suspicious for metachronous lung primary although metastatic disease is not excluded. 2. Subtle focus of hypermetabolism identified in the left aspect of the T12 vertebral body near the pedicle. There is subtle sclerosis in this region on CT imaging. Metastatic involvement a concern. 3. Stable appearance post treatment changes in the left hilum and suprahilar left lung. Only low level FDG accumulation in these regions today suggesting granulation/scarring. 4. No evidence for hypermetabolic disease in the left adrenalectomy and nephrectomy bed. 5. Midline ventral hernia cranial to some mash contains small bowel loops without complicating features. 6.  Aortic Atherosclerois (ICD10-170.0) Electronically Signed: By: Misty Stanley M.D. On: 06/28/2021 09:42   CT Chest W Contrast  Result Date: 06/28/2021 CLINICAL DATA:   Status post left nephrectomy for renal cell cancer about 5 years ago. Left adrenal adenectomy and metastatic resection on 02/17/2020. Restaging. * Tracking Code: BO * EXAM: CT CHEST, ABDOMEN, AND PELVIS WITH CONTRAST TECHNIQUE: Multidetector CT imaging of the chest, abdomen and pelvis was performed following the standard protocol during bolus administration of intravenous contrast. RADIATION DOSE REDUCTION: This exam was performed according to the departmental dose-optimization program which includes automated exposure control, adjustment of the mA and/or kV according to patient size and/or use of iterative reconstruction technique. CONTRAST:  132m OMNIPAQUE IOHEXOL 300 MG/ML  SOLN COMPARISON:  Chest abdomen pelvis CT 05/29/2020 FINDINGS: CT CHEST FINDINGS Cardiovascular: The heart size is normal. No substantial pericardial effusion. Coronary artery calcification is evident. Mild atherosclerotic calcification is noted in the wall of the thoracic aorta. Ascending thoracic aorta measures 3.8 cm diameter. Mediastinum/Nodes: No mediastinal lymphadenopathy. Calcified nodal tissue is seen in the subcarinal station. Soft tissue density in the AP window and left suprahilar region is similar to prior likely related to radiation scarring. The esophagus has normal imaging features. There is no axillary lymphadenopathy. Lungs/Pleura: Volume  loss with scarring in the left upper lobe is similar to prior. 1.7 x 1.4 cm sub solid nodule in the right lower lobe (image [phone]) has increased in size from 6 mm on the 2021 exam. This nodule is noted to be hypermetabolic on PET-CT performed today. No focal airspace consolidation. There is no evidence of pleural effusion. Musculoskeletal: Subtle sclerotic lesion noted left aspect of the T12 vertebral body (see PET-CT report). No other suspicious lytic or sclerotic osseous abnormality. 16 mm enhancing subcutaneous lesion identified in the midline epigastric anterior abdominal wall, just  caudal to the tip of the xiphoid process. This has increased in size from 10 mm on an abdomen pelvis CT of 05/29/2020. CT ABDOMEN PELVIS FINDINGS Hepatobiliary: No suspicious focal abnormality within the liver parenchyma. There is no evidence for gallstones, gallbladder wall thickening, or pericholecystic fluid. No intrahepatic or extrahepatic biliary dilation. Pancreas: No focal mass lesion. No dilatation of the main duct. No intraparenchymal cyst. No peripancreatic edema. Spleen: No splenomegaly. No focal mass lesion. Adrenals/Urinary Tract: Right adrenal gland and kidney unremarkable. Left adrenal gland and left kidney surgically absent. Right ureter unremarkable. The urinary bladder appears normal for the degree of distention. Stomach/Bowel: Stomach is unremarkable. No gastric wall thickening. No evidence of outlet obstruction. Duodenum is normally positioned as is the ligament of Treitz. No small bowel wall thickening. No small bowel dilatation. The terminal ileum is normal. The appendix is not well visualized, but there is no edema or inflammation in the region of the cecum. No gross colonic mass. No colonic wall thickening. Vascular/Lymphatic: There is moderate atherosclerotic calcification of the abdominal aorta without aneurysm. There is no gastrohepatic or hepatoduodenal ligament lymphadenopathy. No retroperitoneal or mesenteric lymphadenopathy. No pelvic sidewall lymphadenopathy. Reproductive: The prostate gland and seminal vesicles are unremarkable. Other: No intraperitoneal free fluid. Musculoskeletal: No worrisome lytic or sclerotic osseous abnormality. IMPRESSION: 1. Interval increase in size of the 1.7 x 1.4 cm sub solid nodule in the right lower lobe, noted to be hypermetabolic on PET-CT performed today. Imaging features are highly suspicious for metachronous primary neoplasm. Metastatic disease not entirely excluded. 2. Subtle sclerotic lesion left aspect of the T12 vertebral body with low level  hypermetabolism on PET imaging (see PET-CT report). 3. 16 mm enhancing subcutaneous lesion in the midline epigastric anterior abdominal wall, just caudal to the tip of the xiphoid process, increased in size from 7 mm on a abdomen pelvis CT of 05/29/2020. This nodule shows low level hypermetabolism on PET imaging. This is indeterminate but given apparent enhancement and low level hypermetabolism, close correlation for metastatic lesion recommended. 4. Status post left nephrectomy and adrenalectomy. No evidence for local recurrence or metastatic disease in the abdomen or pelvis. 5. Midline ventral hernia contains small bowel, just cranial to the ventral mesh. No complicating features. 6. Aortic Atherosclerosis (ICD10-I70.0). Electronically Signed   By: Misty Stanley M.D.   On: 06/28/2021 09:53   CT Abdomen Pelvis W Contrast  Result Date: 06/28/2021 CLINICAL DATA:  Status post left nephrectomy for renal cell cancer about 5 years ago. Left adrenal adenectomy and metastatic resection on 02/17/2020. Restaging. * Tracking Code: BO * EXAM: CT CHEST, ABDOMEN, AND PELVIS WITH CONTRAST TECHNIQUE: Multidetector CT imaging of the chest, abdomen and pelvis was performed following the standard protocol during bolus administration of intravenous contrast. RADIATION DOSE REDUCTION: This exam was performed according to the departmental dose-optimization program which includes automated exposure control, adjustment of the mA and/or kV according to patient size and/or use of iterative  reconstruction technique. CONTRAST:  119m OMNIPAQUE IOHEXOL 300 MG/ML  SOLN COMPARISON:  Chest abdomen pelvis CT 05/29/2020 FINDINGS: CT CHEST FINDINGS Cardiovascular: The heart size is normal. No substantial pericardial effusion. Coronary artery calcification is evident. Mild atherosclerotic calcification is noted in the wall of the thoracic aorta. Ascending thoracic aorta measures 3.8 cm diameter. Mediastinum/Nodes: No mediastinal lymphadenopathy.  Calcified nodal tissue is seen in the subcarinal station. Soft tissue density in the AP window and left suprahilar region is similar to prior likely related to radiation scarring. The esophagus has normal imaging features. There is no axillary lymphadenopathy. Lungs/Pleura: Volume loss with scarring in the left upper lobe is similar to prior. 1.7 x 1.4 cm sub solid nodule in the right lower lobe (image [phone] has increased in size from 6 mm on the 2021 exam. This nodule is noted to be hypermetabolic on PET-CT performed today. No focal airspace consolidation. There is no evidence of pleural effusion. Musculoskeletal: Subtle sclerotic lesion noted left aspect of the T12 vertebral body (see PET-CT report). No other suspicious lytic or sclerotic osseous abnormality. 16 mm enhancing subcutaneous lesion identified in the midline epigastric anterior abdominal wall, just caudal to the tip of the xiphoid process. This has increased in size from 10 mm on an abdomen pelvis CT of 05/29/2020. CT ABDOMEN PELVIS FINDINGS Hepatobiliary: No suspicious focal abnormality within the liver parenchyma. There is no evidence for gallstones, gallbladder wall thickening, or pericholecystic fluid. No intrahepatic or extrahepatic biliary dilation. Pancreas: No focal mass lesion. No dilatation of the main duct. No intraparenchymal cyst. No peripancreatic edema. Spleen: No splenomegaly. No focal mass lesion. Adrenals/Urinary Tract: Right adrenal gland and kidney unremarkable. Left adrenal gland and left kidney surgically absent. Right ureter unremarkable. The urinary bladder appears normal for the degree of distention. Stomach/Bowel: Stomach is unremarkable. No gastric wall thickening. No evidence of outlet obstruction. Duodenum is normally positioned as is the ligament of Treitz. No small bowel wall thickening. No small bowel dilatation. The terminal ileum is normal. The appendix is not well visualized, but there is no edema or inflammation in  the region of the cecum. No gross colonic mass. No colonic wall thickening. Vascular/Lymphatic: There is moderate atherosclerotic calcification of the abdominal aorta without aneurysm. There is no gastrohepatic or hepatoduodenal ligament lymphadenopathy. No retroperitoneal or mesenteric lymphadenopathy. No pelvic sidewall lymphadenopathy. Reproductive: The prostate gland and seminal vesicles are unremarkable. Other: No intraperitoneal free fluid. Musculoskeletal: No worrisome lytic or sclerotic osseous abnormality. IMPRESSION: 1. Interval increase in size of the 1.7 x 1.4 cm sub solid nodule in the right lower lobe, noted to be hypermetabolic on PET-CT performed today. Imaging features are highly suspicious for metachronous primary neoplasm. Metastatic disease not entirely excluded. 2. Subtle sclerotic lesion left aspect of the T12 vertebral body with low level hypermetabolism on PET imaging (see PET-CT report). 3. 16 mm enhancing subcutaneous lesion in the midline epigastric anterior abdominal wall, just caudal to the tip of the xiphoid process, increased in size from 7 mm on a abdomen pelvis CT of 05/29/2020. This nodule shows low level hypermetabolism on PET imaging. This is indeterminate but given apparent enhancement and low level hypermetabolism, close correlation for metastatic lesion recommended. 4. Status post left nephrectomy and adrenalectomy. No evidence for local recurrence or metastatic disease in the abdomen or pelvis. 5. Midline ventral hernia contains small bowel, just cranial to the ventral mesh. No complicating features. 6. Aortic Atherosclerosis (ICD10-I70.0). Electronically Signed   By: EMisty StanleyM.D.   On: 06/28/2021 09:53  ASSESSMENT AND PLAN:  This is a very pleasant 71 years old white male with history of stage IIIa non-small cell lung cancer status post concurrent chemoradiation with weekly carboplatin and paclitaxel followed by observation since the patient was noted good  candidate for consolidation chemotherapy at that time. He also has a history of renal cell carcinoma status post left radical nephrectomy. The patient has been in observation since January 2015. The patient has been on observation since that time and he is feeling fine with no concerning complaints. Repeat CT scan of the chest, abdomen pelvis performed recently showed enlarging left adrenal mass but the mass has low hypermetabolic activity on the previous PET scan in September 2020. The patient has been on observation since that time and he is feeling fine with no concerning complaints. Repeat CT scan of the chest, abdomen pelvis as well as PET scan showed interval increase in the size of the 1.7 x 1.4 cm subsolid nodule in the right lower lobe noted to be hypermetabolic on the PET scan and suspicious for metachronous primary neoplasm.  Metastatic disease not entirely excluded.  The patient also has a supple sclerotic lesion in the left aspect of the T12 vertebral body with low-level hypermetabolism on the PET scan and a 1.6 cm enhancing subcutaneous lesion in the midline epigastric anterior abdominal wall increased in size from 0.7 cm on CT of the abdomen pelvis in May 2022. I personally and independently reviewed the scan images and discussed the result and showed the images to the patient and his wife. I recommended for the patient to proceed with excision of the anterior abdominal epigastric nodule by Dr. Hassell Done followed by his hernia repair. If the nodule is suspicious for metastatic lung cancer, I will arrange for the patient to come sooner for discussion of treatment options. If the abdominal nodule is not consistent with non-small cell lung cancer or malignancy, I will arrange for the patient to come back for follow-up visit in 2 months for evaluation and consideration of biopsy of the right lower lobe lung nodule by either pulmonary medicine or interventional radiology.  Hopefully by that time he  would have recovered from his hernia surgery.  His hernia is presenting asymptomatic problem for him right now and it is okay to proceed with the surgical intervention before considering The patient for additional treatment if he has recurrent lung cancer. The patient and his wife are in agreement with the current plan. He was advised to call immediately if he has any other concerning symptoms in the interval.  I strongly encouraged the patient to call immediately if he has any concerning chest pain or shortness of breath in the interval. Disclaimer: This note was dictated with voice recognition software. Similar sounding words can inadvertently be transcribed and may not be corrected upon review.

## 2021-07-29 NOTE — Progress Notes (Signed)
COVID Vaccine Completed:  Date of COVID positive in last 90 days:  PCP - Dineen Kid, MD Cardiologist -   Chest x-ray - 06/26/21 Epic EKG -  Stress Test -  ECHO -  Cardiac Cath -  Pacemaker/ICD device last checked: Spinal Cord Stimulator:  Bowel Prep -   Sleep Study -  CPAP -   Fasting Blood Sugar -  Checks Blood Sugar _____ times a day  Blood Thinner Instructions: Aspirin Instructions: Last Dose:  Activity level:  Can go up a flight of stairs and perform activities of daily living without stopping and without symptoms of chest pain or shortness of breath.  Able to exercise without symptoms  Unable to go up a flight of stairs without symptoms of     Anesthesia review:   Patient denies shortness of breath, fever, cough and chest pain at PAT appointment  Patient verbalized understanding of instructions that were given to them at the PAT appointment. Patient was also instructed that they will need to review over the PAT instructions again at home before surgery.

## 2021-07-29 NOTE — Patient Instructions (Signed)
SURGICAL WAITING ROOM VISITATION Patients having surgery or a procedure may have no more than 2 support people in the waiting area - these visitors may rotate.   Children under the age of 64 must have an adult with them who is not the patient. If the patient needs to stay at the hospital during part of their recovery, the visitor guidelines for inpatient rooms apply. Pre-op nurse will coordinate an appropriate time for 1 support person to accompany patient in pre-op.  This support person may not rotate.    Please refer to the [facility] website for the visitor guidelines for Inpatients (after your surgery is over and you are in a regular room).    Your procedure is scheduled on: 08/13/21   Report to [facility] Main Entrance    Report to admitting at 7:15 AM   Call this number if you have problems the morning of surgery [phone]   Do not eat food :After Midnight.   After Midnight you may have the following liquids until 6:30 AM DAY OF SURGERY  Water Non-Citrus Juices (without pulp, NO RED) Carbonated Beverages Black Coffee (NO MILK/CREAM OR CREAMERS, sugar ok)  Clear Tea (NO MILK/CREAM OR CREAMERS, sugar ok) regular and decaf                             Plain Jell-O (NO RED)                                           Fruit ices (not with fruit pulp, NO RED)                                     Popsicles (NO RED)                                                               Sports drinks like Gatorade (NO RED)  FOLLOW BOWEL PREP AND ANY ADDITIONAL PRE OP INSTRUCTIONS YOU RECEIVED FROM YOUR SURGEON'S OFFICE!!!     Oral Hygiene is also important to reduce your risk of infection.                                    Remember - BRUSH YOUR TEETH THE MORNING OF SURGERY WITH YOUR REGULAR TOOTHPASTE   Take these medicines the morning of surgery with A SIP OF WATER: Oxycodone                               You may not have any metal on your body including jewelry, and body  piercing             Do not wear lotions, powders, cologne, or deodorant              Men may shave face and neck.   Do not bring valuables to the hospital. [place] IS NOT  RESPONSIBLE   FOR VALUABLES.   Bring small overnight bag day of surgery.   DO NOT [place]. PHARMACY WILL DISPENSE MEDICATIONS LISTED ON YOUR MEDICATION LIST TO YOU DURING YOUR ADMISSION [place]!               Please read over the following fact sheets you were given: IF YOU HAVE QUESTIONS ABOUT YOUR PRE-OP INSTRUCTIONS PLEASE CALL [place] - Preparing for Surgery Before surgery, you can play an important role.  Because skin is not sterile, your skin needs to be as free of germs as possible.  You can reduce the number of germs on your skin by washing with CHG (chlorahexidine gluconate) soap before surgery.  CHG is an antiseptic cleaner which kills germs and bonds with the skin to continue killing germs even after washing. Please DO NOT use if you have an allergy to CHG or antibacterial soaps.  If your skin becomes reddened/irritated stop using the CHG and inform your nurse when you arrive at Short Stay. Do not shave (including legs and underarms) for at least 48 hours prior to the first CHG shower.  You may shave your face/neck.  Please follow these instructions carefully:  1.  Shower with CHG Soap the night before surgery and the  morning of surgery.  2.  If you choose to wash your hair, wash your hair first as usual with your normal  shampoo.  3.  After you shampoo, rinse your hair and body thoroughly to remove the shampoo.                             4.  Use CHG as you would any other liquid soap.  You can apply chg directly to the skin and wash.  Gently with a scrungie or clean washcloth.  5.  Apply the CHG Soap to your body ONLY FROM THE NECK DOWN.   Do   not use on face/ open                           Wound or open sores. Avoid  contact with eyes, ears mouth and   genitals (private parts).                       Wash face,  Genitals (private parts) with your normal soap.             6.  Wash thoroughly, paying special attention to the area where your    surgery  will be performed.  7.  Thoroughly rinse your body with warm water from the neck down.  8.  DO NOT shower/wash with your normal soap after using and rinsing off the CHG Soap.                9.  Pat yourself dry with a clean towel.            10.  Wear clean pajamas.            11.  Place clean sheets on your bed the night of your first shower and do not  sleep with pets. Day of Surgery : Do not apply any lotions/deodorants the morning of surgery.  Please wear clean clothes to the hospital/surgery center.  FAILURE TO FOLLOW THESE INSTRUCTIONS MAY RESULT IN THE CANCELLATION OF  YOUR SURGERY  PATIENT SIGNATURE_________________________________  NURSE SIGNATURE__________________________________  ________________________________________________________________________

## 2021-07-29 NOTE — Progress Notes (Signed)
Please place orders for PAT appointment scheduled 07/30/21.

## 2021-07-30 ENCOUNTER — Encounter (HOSPITAL_COMMUNITY): Payer: Self-pay

## 2021-07-30 ENCOUNTER — Encounter (HOSPITAL_COMMUNITY)
Admission: RE | Admit: 2021-07-30 | Discharge: 2021-07-30 | Disposition: A | Discharge status: Home / Self Care | Payer: Medicare Other | Source: Ambulatory Visit | Attending: Surgery | Admitting: Surgery

## 2021-07-30 VITALS — BP 130/73 | HR 81 | Temp 98.4°F | Resp 12 | Ht 67.0 in | Wt 166.6 lb

## 2021-07-30 DIAGNOSIS — Z01818 Encounter for other preprocedural examination: Secondary | ICD-10-CM | POA: Diagnosis present

## 2021-07-30 DIAGNOSIS — Z01812 Encounter for preprocedural laboratory examination: Secondary | ICD-10-CM | POA: Diagnosis not present

## 2021-07-30 LAB — CBC
HCT: 44.3 % (ref 39.0–52.0)
Hemoglobin: 14.2 g/dL (ref 13.0–17.0)
MCH: 29 pg (ref 26.0–34.0)
MCHC: 32.1 g/dL (ref 30.0–36.0)
MCV: 90.4 fL (ref 80.0–100.0)
Platelets: 279 10*3/uL (ref 150–400)
RBC: 4.9 MIL/uL (ref 4.22–5.81)
RDW: 13.2 % (ref 11.5–15.5)
WBC: 7.4 10*3/uL (ref 4.0–10.5)
nRBC: 0 % (ref 0.0–0.2)

## 2021-08-12 NOTE — H&P (Signed)
PROVIDER: Joya San, MD  MRN: [id number] DOB: [birth year]  Chief Complaint: Follow-up (Discuss PET scan and hernia)   History of Present Illness: [name] is a 71 y.o. male who is seen today for follow-up after his PET scan. He saw Dr. Earlie Server last week who suggested that he go ahead and have this other fixed eluding his ventral hernia and the subcutaneous nodule removed. He has an enlarging 1.6 cm right lower lobe solid nodule that is hypermetabolic and is suspicious for metachronous lung cancer. He also has a left aspect of T12 that may be recurrent cancer. The nodule in his upper midline is suspicious for but is did not really become hypermetabolic but it needs to be excised. The midline hernia is ventral and contains small bowel with within within it. Fanny Skates placed mesh years ago and this is occurred above it..  I discussed repair with him. We will do this at [facility] under general anesthesia. I do a lap assisted repair of his ventral hernia and then with a separate set of instruments excise this nodule in his upper midline. He is on board with this. In addition he had a date later in July for jury duty in [place]. I think this should be postponed. Dr. Earlie Server wants me to do this surgery first and then he needs to be treated and worked up for possible recurrent lung cancer.  Review of Systems: See HPI as well for other ROS.  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)  Tobacco use disorder   Past Surgical History:  Procedure Laterality Date  APPENDECTOMY  HERNIA REPAIR   NEPHRECTOMY Left    Allergies  Allergen Reactions  Lorazepam Other (See Comments)  Wife states " he became severally confused and combative"   Current Outpatient Medications on File Prior to Visit  Medication Sig Dispense Refill  astaxanthin 4 mg Cap Take by mouth  cholecalciferol, vitamin D3, (VITAMIN D3) 125 mcg (5,000 unit) tablet Take 5,000 Units by mouth once daily  Lactobac no.41/Bifidobact no.7 (PROBIOTIC-10 ORAL) Take by mouth  multivitamin/zinc sulfate/sel (MULTIVITAMIN-ZINC-SELENIUM ORAL) Take by mouth  oxyCODONE (DAZIDOX) 10 mg immediate release tablet TAKE ONE TABLET BY MOUTH FIVE TIMES DAILY AS NEEDED  turmeric (CURCUMIN MISC) Use   No current facility-administered medications on file prior to visit.   No family history on file.   Social History   Tobacco Use  Smoking Status Every Day  Packs/day: 0.50  Types: Cigarettes  Smokeless Tobacco Never    Social History   Socioeconomic History  Marital status: Married  Tobacco Use  Smoking status: Every Day  Packs/day: 0.50  Types: Cigarettes  Smokeless tobacco: Never  Vaping Use  Vaping Use: Never used  Substance and Sexual Activity  Alcohol use: Not Currently  Drug use: Never   Objective:   There were no vitals filed for this visit.  There is no height or weight on file to calculate BMI.  Physical Exam General: Well-developed well-nourished white male  Abdomen: Soft bulge in the upper midline consistent with a ventral hernia. Incision below that were [facility] placed mesh. There is a same  nodule noted just below the xiphoid in the upper midline is suspicious for metastatic nodule  Labs, Imaging and Diagnostic Testing:  I reviewed his PET scan and CT scans reports with him.  Assessment and Plan:  Diagnoses and all orders for this visit:  Recurrent ventral hernia  History of lung cancer    We will schedule surgery at [facility] long under general anesthesia including repair of his ventral hernia and  then excision of the nodule.   Chantee Cerino Donia Pounds, MD

## 2021-08-13 ENCOUNTER — Encounter (HOSPITAL_COMMUNITY)
Admission: RE | Disposition: A | Discharge status: Home / Self Care | Payer: Self-pay | Source: Ambulatory Visit | Attending: Surgery

## 2021-08-13 ENCOUNTER — Ambulatory Visit (HOSPITAL_COMMUNITY)
Admission: RE | Admit: 2021-08-13 | Discharge: 2021-08-13 | Disposition: A | Discharge status: Home / Self Care | Payer: Medicare Other | Source: Ambulatory Visit | Attending: Surgery | Admitting: Surgery

## 2021-08-13 ENCOUNTER — Ambulatory Visit (HOSPITAL_BASED_OUTPATIENT_CLINIC_OR_DEPARTMENT_OTHER): Payer: Medicare Other | Admitting: Anesthesiology

## 2021-08-13 ENCOUNTER — Encounter (HOSPITAL_COMMUNITY): Payer: Self-pay | Admitting: Surgery

## 2021-08-13 ENCOUNTER — Other Ambulatory Visit: Payer: Self-pay

## 2021-08-13 ENCOUNTER — Ambulatory Visit (HOSPITAL_COMMUNITY): Payer: Medicare Other | Admitting: Physician Assistant

## 2021-08-13 DIAGNOSIS — Z87891 Personal history of nicotine dependence: Secondary | ICD-10-CM | POA: Diagnosis not present

## 2021-08-13 DIAGNOSIS — F17210 Nicotine dependence, cigarettes, uncomplicated: Secondary | ICD-10-CM | POA: Diagnosis not present

## 2021-08-13 DIAGNOSIS — C3490 Malignant neoplasm of unspecified part of unspecified bronchus or lung: Secondary | ICD-10-CM | POA: Diagnosis not present

## 2021-08-13 DIAGNOSIS — G4733 Obstructive sleep apnea (adult) (pediatric): Secondary | ICD-10-CM | POA: Diagnosis not present

## 2021-08-13 DIAGNOSIS — C7989 Secondary malignant neoplasm of other specified sites: Secondary | ICD-10-CM | POA: Diagnosis not present

## 2021-08-13 DIAGNOSIS — Z8589 Personal history of malignant neoplasm of other organs and systems: Secondary | ICD-10-CM | POA: Insufficient documentation

## 2021-08-13 DIAGNOSIS — M1990 Unspecified osteoarthritis, unspecified site: Secondary | ICD-10-CM | POA: Insufficient documentation

## 2021-08-13 DIAGNOSIS — K436 Other and unspecified ventral hernia with obstruction, without gangrene: Secondary | ICD-10-CM

## 2021-08-13 DIAGNOSIS — C642 Malignant neoplasm of left kidney, except renal pelvis: Secondary | ICD-10-CM | POA: Diagnosis not present

## 2021-08-13 DIAGNOSIS — J449 Chronic obstructive pulmonary disease, unspecified: Secondary | ICD-10-CM | POA: Diagnosis not present

## 2021-08-13 DIAGNOSIS — K430 Incisional hernia with obstruction, without gangrene: Secondary | ICD-10-CM | POA: Diagnosis present

## 2021-08-13 HISTORY — PX: VENTRAL HERNIA REPAIR: SHX424

## 2021-08-13 HISTORY — PX: MASS EXCISION: SHX2000

## 2021-08-13 SURGERY — REPAIR, HERNIA, VENTRAL, LAPAROSCOPIC
Anesthesia: General | Site: Chest

## 2021-08-13 MED ORDER — OXYCODONE HCL 5 MG PO TABS: 5.0000 mg | ORAL_TABLET | Freq: Four times a day (QID) | ORAL | 0 refills | Status: DC | PRN

## 2021-08-13 MED ORDER — SUGAMMADEX SODIUM 200 MG/2ML IV SOLN
INTRAVENOUS | Status: DC | PRN
  Administered 2021-08-13: 200 mg via INTRAVENOUS

## 2021-08-13 MED ORDER — 0.9 % SODIUM CHLORIDE (POUR BTL) OPTIME
TOPICAL | Status: DC | PRN
  Administered 2021-08-13: 1000 mL

## 2021-08-13 MED ORDER — FENTANYL CITRATE (PF) 100 MCG/2ML IJ SOLN
INTRAMUSCULAR | Status: DC | PRN
  Administered 2021-08-13 (×3): 50 ug via INTRAVENOUS
  Administered 2021-08-13: 100 ug via INTRAVENOUS

## 2021-08-13 MED ORDER — PHENYLEPHRINE 80 MCG/ML (10ML) SYRINGE FOR IV PUSH (FOR BLOOD PRESSURE SUPPORT)
PREFILLED_SYRINGE | INTRAVENOUS | Status: DC | PRN
  Administered 2021-08-13 (×2): 120 ug via INTRAVENOUS

## 2021-08-13 MED ORDER — PROPOFOL 10 MG/ML IV BOLUS
INTRAVENOUS | Status: AC
  Filled 2021-08-13: qty 20

## 2021-08-13 MED ORDER — KETAMINE HCL 10 MG/ML IJ SOLN
INTRAMUSCULAR | Status: DC | PRN
  Administered 2021-08-13: 30 mg via INTRAVENOUS
  Administered 2021-08-13 (×2): 10 mg via INTRAVENOUS

## 2021-08-13 MED ORDER — CHLORHEXIDINE GLUCONATE CLOTH 2 % EX PADS: 6.0000 | MEDICATED_PAD | Freq: Once | CUTANEOUS | Status: DC

## 2021-08-13 MED ORDER — PROPOFOL 10 MG/ML IV BOLUS
INTRAVENOUS | Status: DC | PRN
  Administered 2021-08-13: 60 mg via INTRAVENOUS
  Administered 2021-08-13: 140 mg via INTRAVENOUS

## 2021-08-13 MED ORDER — FENTANYL CITRATE (PF) 250 MCG/5ML IJ SOLN
INTRAMUSCULAR | Status: AC
  Filled 2021-08-13: qty 5

## 2021-08-13 MED ORDER — SODIUM CHLORIDE (PF) 0.9 % IJ SOLN
INTRAMUSCULAR | Status: DC | PRN
  Administered 2021-08-13: 10 mL

## 2021-08-13 MED ORDER — ACETAMINOPHEN 500 MG PO TABS
1000.0000 mg | ORAL_TABLET | Freq: Once | ORAL | Status: AC
  Administered 2021-08-13: 1000 mg via ORAL
  Filled 2021-08-13: qty 2

## 2021-08-13 MED ORDER — ROCURONIUM BROMIDE 10 MG/ML (PF) SYRINGE
PREFILLED_SYRINGE | INTRAVENOUS | Status: AC
  Filled 2021-08-13: qty 10

## 2021-08-13 MED ORDER — FENTANYL CITRATE PF 50 MCG/ML IJ SOSY
PREFILLED_SYRINGE | INTRAMUSCULAR | Status: AC
  Administered 2021-08-13: 50 ug via INTRAVENOUS
  Filled 2021-08-13: qty 3

## 2021-08-13 MED ORDER — ONDANSETRON HCL 4 MG/2ML IJ SOLN: 4.0000 mg | Freq: Once | INTRAMUSCULAR | Status: DC | PRN

## 2021-08-13 MED ORDER — BUPIVACAINE LIPOSOME 1.3 % IJ SUSP: 20.0000 mL | Freq: Once | INTRAMUSCULAR | Status: DC

## 2021-08-13 MED ORDER — CHLORHEXIDINE GLUCONATE 0.12 % MT SOLN
15.0000 mL | Freq: Once | OROMUCOSAL | Status: AC
  Administered 2021-08-13: 15 mL via OROMUCOSAL

## 2021-08-13 MED ORDER — BUPIVACAINE LIPOSOME 1.3 % IJ SUSP
INTRAMUSCULAR | Status: DC | PRN
  Administered 2021-08-13: 20 mL

## 2021-08-13 MED ORDER — BUPIVACAINE LIPOSOME 1.3 % IJ SUSP
INTRAMUSCULAR | Status: AC
  Filled 2021-08-13: qty 20

## 2021-08-13 MED ORDER — SCOPOLAMINE 1 MG/3DAYS TD PT72
1.0000 | MEDICATED_PATCH | TRANSDERMAL | Status: DC
  Administered 2021-08-13: 1.5 mg via TRANSDERMAL
  Filled 2021-08-13: qty 1

## 2021-08-13 MED ORDER — KETAMINE HCL 50 MG/5ML IJ SOSY
PREFILLED_SYRINGE | INTRAMUSCULAR | Status: AC
  Filled 2021-08-13: qty 5

## 2021-08-13 MED ORDER — LACTATED RINGERS IV SOLN: INTRAVENOUS | Status: DC

## 2021-08-13 MED ORDER — DEXAMETHASONE SODIUM PHOSPHATE 4 MG/ML IJ SOLN
INTRAMUSCULAR | Status: DC | PRN
  Administered 2021-08-13: 5 mg via INTRAVENOUS

## 2021-08-13 MED ORDER — FENTANYL CITRATE PF 50 MCG/ML IJ SOSY
25.0000 ug | PREFILLED_SYRINGE | INTRAMUSCULAR | Status: DC | PRN
  Administered 2021-08-13 (×2): 50 ug via INTRAVENOUS

## 2021-08-13 MED ORDER — CEFAZOLIN SODIUM-DEXTROSE 2-4 GM/100ML-% IV SOLN
2.0000 g | INTRAVENOUS | Status: AC
  Administered 2021-08-13: 2 g via INTRAVENOUS
  Filled 2021-08-13: qty 100

## 2021-08-13 MED ORDER — ONDANSETRON HCL 4 MG/2ML IJ SOLN
INTRAMUSCULAR | Status: AC
  Filled 2021-08-13: qty 2

## 2021-08-13 MED ORDER — ONDANSETRON HCL 4 MG PO TABS: 4.0000 mg | ORAL_TABLET | Freq: Every day | ORAL | 1 refills | Status: DC | PRN

## 2021-08-13 MED ORDER — ONDANSETRON HCL 4 MG/2ML IJ SOLN
INTRAMUSCULAR | Status: DC | PRN
  Administered 2021-08-13: 4 mg via INTRAVENOUS

## 2021-08-13 MED ORDER — ROCURONIUM BROMIDE 10 MG/ML (PF) SYRINGE
PREFILLED_SYRINGE | INTRAVENOUS | Status: DC | PRN
  Administered 2021-08-13: 60 mg via INTRAVENOUS
  Administered 2021-08-13: 40 mg via INTRAVENOUS

## 2021-08-13 MED ORDER — LIDOCAINE 2% (20 MG/ML) 5 ML SYRINGE
INTRAMUSCULAR | Status: DC | PRN
  Administered 2021-08-13: 80 mg via INTRAVENOUS

## 2021-08-13 MED ORDER — ORAL CARE MOUTH RINSE: 15.0000 mL | Freq: Once | OROMUCOSAL | Status: AC

## 2021-08-13 MED ORDER — DEXAMETHASONE SODIUM PHOSPHATE 10 MG/ML IJ SOLN
INTRAMUSCULAR | Status: AC
  Filled 2021-08-13: qty 1

## 2021-08-13 MED ORDER — LIDOCAINE HCL (PF) 2 % IJ SOLN
INTRAMUSCULAR | Status: AC
  Filled 2021-08-13: qty 5

## 2021-08-13 SURGICAL SUPPLY — 61 items
ADH SKN CLS APL DERMABOND .7 (GAUZE/BANDAGES/DRESSINGS) ×2
APL PRP STRL LF DISP 70% ISPRP (MISCELLANEOUS) ×2
APL SKNCLS STERI-STRIP NONHPOA (GAUZE/BANDAGES/DRESSINGS)
BAG COUNTER SPONGE SURGICOUNT (BAG) IMPLANT
BAG SPNG CNTER NS LX DISP (BAG)
BENZOIN TINCTURE PRP APPL 2/3 (GAUZE/BANDAGES/DRESSINGS) IMPLANT
BINDER ABDOMINAL 12 ML 46-62 (SOFTGOODS) ×1 IMPLANT
CABLE HIGH FREQUENCY MONO STRZ (ELECTRODE) IMPLANT
CHLORAPREP W/TINT 26 (MISCELLANEOUS) ×3 IMPLANT
COVER SURGICAL LIGHT HANDLE (MISCELLANEOUS) ×3 IMPLANT
DERMABOND ADVANCED (GAUZE/BANDAGES/DRESSINGS) ×1
DERMABOND ADVANCED .7 DNX12 (GAUZE/BANDAGES/DRESSINGS) ×2 IMPLANT
DEVICE SECURE STRAP 25 ABSORB (INSTRUMENTS) ×1 IMPLANT
DEVICE TROCAR PUNCTURE CLOSURE (ENDOMECHANICALS) ×4 IMPLANT
DISSECTOR BLUNT TIP ENDO 5MM (MISCELLANEOUS) IMPLANT
DRAPE LAPAROSCOPIC ABDOMINAL (DRAPES) IMPLANT
ELECT L-HOOK LAP 45CM DISP (ELECTROSURGICAL)
ELECT PENCIL ROCKER SW 15FT (MISCELLANEOUS) IMPLANT
ELECT REM PT RETURN 15FT ADLT (MISCELLANEOUS) ×3 IMPLANT
ELECTRODE L-HOOK LAP 45CM DISP (ELECTROSURGICAL) IMPLANT
GAUZE PACKING IODOFORM 1/4X15 (PACKING) IMPLANT
GAUZE SPONGE 4X4 12PLY STRL (GAUZE/BANDAGES/DRESSINGS) ×3 IMPLANT
GLOVE SURG LX 8.0 MICRO (GLOVE) ×1
GLOVE SURG LX STRL 8.0 MICRO (GLOVE) ×2 IMPLANT
GOWN STRL REUS W/ TWL XL LVL3 (GOWN DISPOSABLE) ×6 IMPLANT
GOWN STRL REUS W/TWL XL LVL3 (GOWN DISPOSABLE) ×9
IRRIG SUCT STRYKERFLOW 2 WTIP (MISCELLANEOUS)
IRRIGATION SUCT STRKRFLW 2 WTP (MISCELLANEOUS) IMPLANT
KIT BASIN OR (CUSTOM PROCEDURE TRAY) ×3 IMPLANT
KIT TURNOVER KIT A (KITS) IMPLANT
MARKER SKIN DUAL TIP RULER LAB (MISCELLANEOUS) ×2 IMPLANT
MESH VENTRALIGHT ST 6IN CRC (Mesh General) ×1 IMPLANT
NDL SPNL 22GX3.5 QUINCKE BK (NEEDLE) ×2 IMPLANT
NEEDLE HYPO 22GX1.5 SAFETY (NEEDLE) ×3 IMPLANT
NEEDLE SPNL 22GX3.5 QUINCKE BK (NEEDLE) ×3 IMPLANT
PAD TELFA 2X3 NADH STRL (GAUZE/BANDAGES/DRESSINGS) IMPLANT
SCISSORS LAP 5X45 EPIX DISP (ENDOMECHANICALS) ×3 IMPLANT
SET TUBE SMOKE EVAC HIGH FLOW (TUBING) ×3 IMPLANT
SHEARS HARMONIC ACE PLUS 45CM (MISCELLANEOUS) ×1 IMPLANT
SLEEVE ADV FIXATION 5X100MM (TROCAR) ×1 IMPLANT
SLEEVE Z-THREAD 5X100MM (TROCAR) IMPLANT
SPIKE FLUID TRANSFER (MISCELLANEOUS) ×3 IMPLANT
SPONGE T-LAP 4X18 ~~LOC~~+RFID (SPONGE) IMPLANT
STAPLER VISISTAT 35W (STAPLE) IMPLANT
SUT MNCRL AB 4-0 PS2 18 (SUTURE) ×3 IMPLANT
SUT NOVA NAB DX-16 0-1 5-0 T12 (SUTURE) ×2 IMPLANT
SUT PROLENE 0 CT 1 CR/8 (SUTURE) ×2 IMPLANT
SUT VIC AB 3-0 SH 27 (SUTURE)
SUT VIC AB 3-0 SH 27XBRD (SUTURE) IMPLANT
SYR 10ML ECCENTRIC (SYRINGE) IMPLANT
SYR 20ML LL LF (SYRINGE) ×2 IMPLANT
TACKER 5MM HERNIA 3.5CML NAB (ENDOMECHANICALS) IMPLANT
TOWEL OR 17X26 10 PK STRL BLUE (TOWEL DISPOSABLE) ×2 IMPLANT
TOWEL OR NON WOVEN STRL DISP B (DISPOSABLE) ×2 IMPLANT
TRAY FOLEY MTR SLVR 16FR STAT (SET/KITS/TRAYS/PACK) IMPLANT
TRAY LAPAROSCOPIC (CUSTOM PROCEDURE TRAY) ×3 IMPLANT
TROCAR 11X100 Z THREAD (TROCAR) IMPLANT
TROCAR ADV FIXATION 5X100MM (TROCAR) ×1 IMPLANT
TROCAR XCEL NON-BLD 5MMX100MML (ENDOMECHANICALS) ×1 IMPLANT
TROCAR Z-THREAD OPTICAL 5X100M (TROCAR) ×3 IMPLANT
YANKAUER SUCT BULB TIP NO VENT (SUCTIONS) ×3 IMPLANT

## 2021-08-13 NOTE — Anesthesia Preprocedure Evaluation (Addendum)
Anesthesia Evaluation  Patient identified by MRN, date of birth, ID band Patient awake    Reviewed: Allergy & Precautions, NPO status , Patient's Chart, lab work & pertinent test results  Airway Mallampati: II  TM Distance: >3 FB Neck ROM: Limited    Dental  (+) Dental Advisory Given, Poor Dentition   Pulmonary sleep apnea , COPD, Current Smoker and Patient abstained from smoking.,  Lung cancer    Pulmonary exam normal breath sounds clear to auscultation       Cardiovascular negative cardio ROS Normal cardiovascular exam Rhythm:Regular Rate:Normal     Neuro/Psych negative neurological ROS  negative psych ROS   GI/Hepatic Neg liver ROS, VENTRAL HERNIA Crohn's disease   Endo/Other  negative endocrine ROS  Renal/GU Renal disease (RCC; Left adrenal cancer)     Musculoskeletal  (+) Arthritis ,   Abdominal   Peds  Hematology negative hematology ROS (+)   Anesthesia Other Findings Day of surgery medications reviewed with the patient.  Reproductive/Obstetrics                            Anesthesia Physical Anesthesia Plan  ASA: 3  Anesthesia Plan: General   Post-op Pain Management: Tylenol PO (pre-op)*   Induction: Intravenous  PONV Risk Score and Plan: 2 and Dexamethasone and Ondansetron  Airway Management Planned: Oral ETT and Video Laryngoscope Planned  Additional Equipment:   Intra-op Plan:   Post-operative Plan: Extubation in OR  Informed Consent: I have reviewed the patients History and Physical, chart, labs and discussed the procedure including the risks, benefits and alternatives for the proposed anesthesia with the patient or authorized representative who has indicated his/her understanding and acceptance.     Dental advisory given  Plan Discussed with: CRNA  Anesthesia Plan Comments:         Anesthesia Quick Evaluation

## 2021-08-13 NOTE — Interval H&P Note (Signed)
History and Physical Interval Note:  08/13/2021 9:31 AM  [name]  has presented today for surgery, with the diagnosis of VENTRAL HERNIA, SKIN NODULE.  The various methods of treatment have been discussed with the patient and family. After consideration of risks, benefits and other options for treatment, the patient has consented to  Procedure(s): LAPAROSCOPIC VENTRAL HERNIA (N/A) EXCISION OF SKIN NODULE (N/A) as a surgical intervention.  The patient's history has been reviewed, patient examined, no change in status, stable for surgery.  I have reviewed the patient's chart and labs.  Questions were answered to the patient's satisfaction.     [name]

## 2021-08-13 NOTE — Anesthesia Procedure Notes (Signed)
Procedure Name: Intubation Date/Time: 08/13/2021 10:00 AM  Performed by: Claudia Desanctis, CRNAPre-anesthesia Checklist: Emergency Drugs available, Suction available, Patient identified and Patient being monitored Patient Re-evaluated:Patient Re-evaluated prior to induction Oxygen Delivery Method: Circle system utilized Preoxygenation: Pre-oxygenation with 100% oxygen Induction Type: IV induction Ventilation: Mask ventilation without difficulty Laryngoscope Size: Glidescope and 4 Grade View: Grade I Tube type: Oral Tube size: 7.5 mm Number of attempts: 1 Airway Equipment and Method: Video-laryngoscopy Placement Confirmation: ETT inserted through vocal cords under direct vision, positive ETCO2 and breath sounds checked- equal and bilateral Tube secured with: Tape Dental Injury: Teeth and Oropharynx as per pre-operative assessment  Difficulty Due To: Difficulty was anticipated and Difficult Airway- due to reduced neck mobility Comments: Pts neck with extreme flexion, required 3 pillows to keep supported, easy grade 1 view with glidescope

## 2021-08-13 NOTE — Op Note (Signed)
[name]  02-01-1950   08/13/2021    PCP:  Dineen Kid, MD   Surgeon: Kaylyn Lim, MD, FACS  Asst:  Gwynn Burly, MD  Anes:  general  Preop Dx: Ventral hernia (recurrent, incarcerated) with subxiphoid subcutaneous nodule Postop Dx: Same   Procedure: Lap assisted repair of ventral hernia with Ventralex coated ~6 inch round mesh, excision of nodule of subcutaneous tissue Location Surgery: [facility] 1 Complications: None noted  EBL:   minimal cc  Drains: none  Description of Procedure:  The patient was taken to OR 1 .  After anesthesia was administered and the patient was prepped  with chloroprep  and a timeout was performed.  Access the abdomen was achieved with a 5 mm Optiview through the left upper quadrant.  A second port in the left lower quadrant was used.  The previous pariah Tex mesh had a lot of adhesions principally to the titanium tacks and secured it.  We took all this down with sharp dissection taking care to avoid any bowel which we did not see any and this was mainly omentum.  There was a hernia in the upper portion of this with a septum.  We reduced the material from that.  We made a transverse incision over this bulge and freed the hernia sac and when this was clear of the subcutaneous tissue this was amputated with a Bovie.  The resultant defect was large enough being about 3 finger breaths to have a larger round piece of mesh.  We took a circular piece of mesh and after noting the center we had for anchor horizontal mattress long Prolene sutures which we then used the Endo Close to go away from the opening and go in and grass that pull it out the subsequently tie.  When this was had been done and the mesh was deployed in the abdomen we then closed the fascial defect primarily with figure-of-eight sutures of 0 Prolene.  Next we reinflated the abdomen and we pulled up the sutures that were holding the mesh.  When these were tied these became transfascial fixation points.  We  will send with the closure of the midline it did cause a little bit of gusset in the middle but not too bad.  We secured the remaining portion with a secure strap absorbable tacker.  The upper midline body was addressed after we had closed the incisions and irrigated from the previous surgery with both 3-0 Vicryl and with 4-0 Monocryl and Dermabond.  I was suspicious that the subcutaneous nodule was hematogenous mets from his previous adrenal surgery where he had metastatic disease.  This was excised transversely.  There was no obvious tumor transected and it was sent in toto to pathology.  I also sent the hernia sac for examination since there is a previous history of cancer.  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  Following closure of the nodule excision Dermabond was applied.  Also I injected all the incisions with Exparel that been diluted to 30 cc.  This included the 2 port sites on the left and the transverse incision where the hernia sac was removed.  The patient tolerated the procedure well and was taken to the PACU in stable condition.     Matt B. Hassell Done, Kay, [facility] Surgery, [place]

## 2021-08-13 NOTE — Transfer of Care (Signed)
Immediate Anesthesia Transfer of Care Note  Patient: [name]  Procedure(s) Performed: LAPAROSCOPIC VENTRAL HERNIA WITH MESH (Abdomen) EXCISION OF SUBXIPHOID SUBCUTANEOUS  NODULE (Chest)  Patient Location: PACU  Anesthesia Type:General  Level of Consciousness: awake and patient cooperative  Airway & Oxygen Therapy: Patient Spontanous Breathing and Patient connected to face mask  Post-op Assessment: Report given to RN and Post -op Vital signs reviewed and stable  Post vital signs: Reviewed and stable  Last Vitals:  Vitals Value Taken Time  BP 150/72 08/13/21 1203  Temp    Pulse 68 08/13/21 1208  Resp 13 08/13/21 1208  SpO2 100 % 08/13/21 1208  Vitals shown include unvalidated device data.  Last Pain:  Vitals:   08/13/21 0738  TempSrc:   PainSc: 0-No pain         Complications:  Encounter Notable Events  Notable Event Outcome Phase Comment  Difficult to intubate - expected  Intraprocedure Filed from anesthesia note documentation.

## 2021-08-14 ENCOUNTER — Encounter (HOSPITAL_COMMUNITY): Payer: Self-pay | Admitting: Surgery

## 2021-08-14 LAB — SURGICAL PATHOLOGY

## 2021-08-14 NOTE — Anesthesia Postprocedure Evaluation (Signed)
Anesthesia Post Note  Patient: [name]  Procedure(s) Performed: LAPAROSCOPIC VENTRAL HERNIA WITH MESH (Abdomen) EXCISION OF SUBXIPHOID SUBCUTANEOUS  NODULE (Chest)     Patient location during evaluation: PACU Anesthesia Type: General Level of consciousness: awake and alert Pain management: pain level controlled Vital Signs Assessment: post-procedure vital signs reviewed and stable Respiratory status: spontaneous breathing, nonlabored ventilation, respiratory function stable and patient connected to nasal cannula oxygen Cardiovascular status: blood pressure returned to baseline and stable Postop Assessment: no apparent nausea or vomiting Anesthetic complications: yes   Encounter Notable Events  Notable Event Outcome Phase Comment  Difficult to intubate - expected  Intraprocedure Filed from anesthesia note documentation.    Last Vitals:  Vitals:   08/13/21 1325 08/13/21 1340  BP:  136/69  Pulse: 72 81  Resp: 12 20  Temp:  (!) 36.4 C  SpO2: 93% 94%    Last Pain:  Vitals:   08/13/21 1340  TempSrc:   PainSc: Twining Marilynn Ekstein

## 2021-09-04 ENCOUNTER — Other Ambulatory Visit: Payer: Self-pay

## 2021-09-04 ENCOUNTER — Inpatient Hospital Stay: Payer: Medicare Other

## 2021-09-04 ENCOUNTER — Inpatient Hospital Stay: Payer: Medicare Other | Attending: Internal Medicine | Admitting: Internal Medicine

## 2021-09-04 VITALS — BP 104/73 | HR 92 | Temp 98.2°F | Resp 14 | Wt 167.1 lb

## 2021-09-04 DIAGNOSIS — C3492 Malignant neoplasm of unspecified part of left bronchus or lung: Secondary | ICD-10-CM

## 2021-09-04 DIAGNOSIS — Z85118 Personal history of other malignant neoplasm of bronchus and lung: Secondary | ICD-10-CM | POA: Diagnosis present

## 2021-09-04 DIAGNOSIS — C642 Malignant neoplasm of left kidney, except renal pelvis: Secondary | ICD-10-CM | POA: Diagnosis not present

## 2021-09-04 DIAGNOSIS — C3490 Malignant neoplasm of unspecified part of unspecified bronchus or lung: Secondary | ICD-10-CM

## 2021-09-04 DIAGNOSIS — Z923 Personal history of irradiation: Secondary | ICD-10-CM | POA: Diagnosis not present

## 2021-09-04 LAB — CMP (CANCER CENTER ONLY)
ALT: 13 U/L (ref 0–44)
AST: 16 U/L (ref 15–41)
Albumin: 3.7 g/dL (ref 3.5–5.0)
Alkaline Phosphatase: 113 U/L (ref 38–126)
Anion gap: 9 (ref 5–15)
BUN: 18 mg/dL (ref 8–23)
CO2: 28 mmol/L (ref 22–32)
Calcium: 10 mg/dL (ref 8.9–10.3)
Chloride: 105 mmol/L (ref 98–111)
Creatinine: 1.34 mg/dL — ABNORMAL HIGH (ref 0.61–1.24)
GFR, Estimated: 56 mL/min — ABNORMAL LOW (ref >60)
Glucose, Bld: 98 mg/dL (ref 70–99)
Potassium: 5 mmol/L (ref 3.5–5.1)
Sodium: 142 mmol/L (ref 135–145)
Total Bilirubin: 0.4 mg/dL (ref 0.3–1.2)
Total Protein: 7.5 g/dL (ref 6.5–8.1)

## 2021-09-04 LAB — CBC WITH DIFFERENTIAL (CANCER CENTER ONLY)
Abs Immature Granulocytes: 0.02 10*3/uL (ref 0.00–0.07)
Basophils Absolute: 0.1 10*3/uL (ref 0.0–0.1)
Basophils Relative: 1 %
Eosinophils Absolute: 0.2 10*3/uL (ref 0.0–0.5)
Eosinophils Relative: 2 %
HCT: 44 % (ref 39.0–52.0)
Hemoglobin: 14.4 g/dL (ref 13.0–17.0)
Immature Granulocytes: 0 %
Lymphocytes Relative: 16 %
Lymphs Abs: 1.3 10*3/uL (ref 0.7–4.0)
MCH: 28.9 pg (ref 26.0–34.0)
MCHC: 32.7 g/dL (ref 30.0–36.0)
MCV: 88.4 fL (ref 80.0–100.0)
Monocytes Absolute: 0.5 10*3/uL (ref 0.1–1.0)
Monocytes Relative: 7 %
Neutro Abs: 5.6 10*3/uL (ref 1.7–7.7)
Neutrophils Relative %: 74 %
Platelet Count: 322 10*3/uL (ref 150–400)
RBC: 4.98 MIL/uL (ref 4.22–5.81)
RDW: 13.2 % (ref 11.5–15.5)
WBC Count: 7.6 10*3/uL (ref 4.0–10.5)
nRBC: 0 % (ref 0.0–0.2)

## 2021-09-04 NOTE — Progress Notes (Signed)
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  The patient had metastatic renal cell carcinoma and a subxiphoid subcutaneous nodule status post resection on 08/13/2021.  PRIOR THERAPY:  1) Concurrent chemoradiation with weekly carboplatin for AUC of 2 and paclitaxel 45 mg/M2, status post 7 cycles, last dose was given 01/31/2013. 2) Status post left laparoscopic radical nephrectomy under the care of Dr. Louis Meckel on 04/04/2013.  CURRENT THERAPY: Observation.  INTERVAL HISTORY: [name] 71 y.o. male returns to the clinic today for follow-up visit accompanied by his wife.  The patient is feeling fine today with no concerning complaints.  He recently underwent a hernia repair by Dr. Hassell Done with excision of subxiphoid subcutaneous nodule and unfortunately the final pathology was consistent with metastatic renal cell carcinoma.  The patient denied having any current chest pain but has the baseline shortness of breath increased with exertion with no cough or hemoptysis.  He has no nausea, vomiting, diarrhea or constipation.  He has no headache or visual changes.  He denied having any recent weight loss or night sweats.  He continues to smoke but trying to quit.  He is here today for evaluation and discussion of his condition and possible treatment options.   MEDICAL HISTORY: Past Medical History:  Diagnosis Date   Adrenal cancer, left ([place])    Arthritis    Cancer of kidney ([place])    left   Complication of anesthesia     Very little movement of neck- "self fusioning"   COPD (chronic obstructive pulmonary disease) (HCC)    Crohn's disease ([place])    no issues since 2012   Difficulty sleeping    occasionally   Dyspnea    with a little exertion,  "smoked cigarettes all my life"   History of blood transfusion    "chron's flare"   Lung cancer (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  MEDICATIONS:  Current Outpatient Medications  Medication Sig Dispense Refill   Astaxanthin 4 MG CAPS Take 4 mg by mouth daily.     ondansetron (ZOFRAN) 4 MG tablet Take 1 tablet (4 mg total) by mouth daily as needed for nausea or vomiting. 90 each 1   oxyCODONE (OXY IR/ROXICODONE) 5 MG immediate release tablet Take 1 tablet (5 mg total) by mouth every 6 (six) hours as needed for severe pain. 15 tablet 0   Oxycodone HCl 10 MG TABS Take 1 tablet (10 mg total) by mouth 5 (five) times daily. 15 tablet 0   Probiotic Product (PROBIOTIC DAILY PO) Take 270 mg by mouth daily. 70 Billion cfu/ 10 strains     Turmeric (QC TUMERIC COMPLEX) 500 MG CAPS Take 500 mg by mouth daily.     Vitamin D-Vitamin K (VITAMIN K2-VITAMIN D3 PO) Take 1 tablet by mouth daily. 125 mcg/ 180  mcg     Zinc Sulfate (ZINC 15 PO) Take 1 tablet by mouth daily. Zinc with Selenium 200 mcg     No current facility-administered medications for this visit.    SURGICAL HISTORY:  Past Surgical History:  Procedure Laterality Date   APPENDECTOMY     COLONOSCOPY     HERNIA REPAIR     x 2   LAPAROSCOPIC NEPHRECTOMY Left 04/04/2013   Procedure: LAPAROSCOPIC LEFT RADICAL NEPHRECTOMY ;  Surgeon: [name], MD;  [facility];  Service: Urology;  Laterality: Left;   LUNG BIOPSY Left 08/08/2016   Procedure: LEFT LUNG BIOPSY;  Surgeon: Grace Isaac, MD;  Location: [facility];   Service: Thoracic;  Laterality: Left;   MASS EXCISION N/A 08/13/2021   Procedure: EXCISION OF SUBXIPHOID SUBCUTANEOUS  NODULE;  Surgeon: Johnathan Hausen, MD;  [facility];  Service: General;  Laterality: N/A;   ROBOTIC ADRENALECTOMY Left 02/17/2020   Procedure: XI ROBOTIC LEFT ADRENALECTOMY;  Surgeon: [name], MD;  [facility];  Service: Urology;  Laterality: Left;  REQUESTING 4HRS   testicular removal  2000   VENTRAL HERNIA REPAIR N/A 08/13/2021   Procedure: LAPAROSCOPIC VENTRAL HERNIA WITH MESH;  Surgeon: Johnathan Hausen, MD;  [facility];  Service: General;  Laterality: N/A;   VIDEO BRONCHOSCOPY N/A 11/30/2012   Procedure: VIDEO BRONCHOSCOPY WITH FLUORO;  Surgeon: Elsie Stain, MD;  [facility];  Service: Cardiopulmonary;  Laterality: N/A;   VIDEO BRONCHOSCOPY N/A 08/08/2016   Procedure: VIDEO BRONCHOSCOPY;  Surgeon: Grace Isaac, MD;  [facility];  Service: Thoracic;  Laterality: N/A;    REVIEW OF SYSTEMS:  Constitutional: positive for fatigue Eyes: negative Ears, nose, mouth, throat, and face: negative Respiratory: positive for dyspnea on exertion Cardiovascular: negative Gastrointestinal: negative Genitourinary:negative Integument/breast: negative Hematologic/lymphatic: negative Musculoskeletal:negative Neurological: negative Behavioral/Psych: negative Endocrine: negative Allergic/Immunologic: negative   PHYSICAL EXAMINATION: General appearance: alert, cooperative, fatigued, and no distress Head: Normocephalic, without obvious abnormality, atraumatic Neck: no adenopathy, no JVD, supple, symmetrical, trachea midline, and thyroid not enlarged, symmetric, no tenderness/mass/nodules Lymph nodes: Cervical, supraclavicular, and axillary nodes normal. Resp: clear to auscultation bilaterally Back: symmetric, no curvature. ROM normal. No CVA tenderness. Cardio: regular rate and rhythm, S1, S2 normal, no murmur, click, rub or gallop GI:  soft, non-tender; bowel sounds normal; no masses,  no organomegaly Extremities: extremities normal, atraumatic, no cyanosis or edema Neurologic: Alert and oriented X 3, normal strength and tone. Normal symmetric reflexes. Normal coordination and gait  ECOG PERFORMANCE STATUS: 1 - Symptomatic but completely ambulatory  Blood pressure 104/73, pulse 92, temperature 98.2 F (36.8 C), temperature source Oral, resp. rate 14, weight 167 lb 1.6 oz (75.8 kg), SpO2 97 %.  LABORATORY DATA: Lab Results  Component Value Date   WBC 7.6 09/04/2021   HGB 14.4 09/04/2021   HCT 44.0 09/04/2021   MCV 88.4 09/04/2021   PLT 322 09/04/2021      Chemistry      Component Value Date/Time   NA 138 06/26/2021 1353   NA 142 06/30/2016 1406   K 4.1 06/26/2021 1353   K 4.8 06/30/2016 1406   CL 101 06/26/2021 1353   CO2 28 06/26/2021 1353   CO2 29 06/30/2016 1406   BUN 16 06/26/2021 1353   BUN 14.8 06/30/2016 1406   CREATININE 1.38 (H) 06/26/2021 1353   CREATININE 1.4 (H) 06/30/2016 1406      Component Value Date/Time   CALCIUM 9.1 06/26/2021 1353   CALCIUM 9.8  06/30/2016 1406   ALKPHOS 105 06/26/2021 1353   ALKPHOS 122 06/30/2016 1406   AST 16 06/26/2021 1353   AST 11 06/30/2016 1406   ALT 12 06/26/2021 1353   ALT 13 06/30/2016 1406   BILITOT 0.6 06/26/2021 1353   BILITOT 0.40 06/30/2016 1406       RADIOGRAPHIC STUDIES: No results found.   ASSESSMENT AND PLAN:  This is a very pleasant 71 years old white male with history of stage IIIa non-small cell lung cancer status post concurrent chemoradiation with weekly carboplatin and paclitaxel followed by observation since the patient was noted good candidate for consolidation chemotherapy at that time. He also has a history of renal cell carcinoma status post left radical nephrectomy. The patient has been in observation since January 2015. The patient has been on observation since that time and he is feeling fine with no concerning  complaints. Repeat CT scan of the chest, abdomen pelvis performed recently showed enlarging left adrenal mass but the mass has low hypermetabolic activity on the previous PET scan in September 2020. The patient has been on observation since that time and he is feeling fine with no concerning complaints. Repeat CT scan of the chest, abdomen pelvis as well as PET scan on June 26, 2021 showed interval increase in the size of the 1.7 x 1.4 cm subsolid nodule in the right lower lobe noted to be hypermetabolic on the PET scan and suspicious for metachronous primary neoplasm.  Metastatic disease not entirely excluded.  The patient also has a supple sclerotic lesion in the left aspect of the T12 vertebral body with low-level hypermetabolism on the PET scan and a 1.6 cm enhancing subcutaneous lesion in the midline epigastric anterior abdominal wall increased in size from 0.7 cm on CT of the abdomen pelvis in May 2022. The patient recently underwent a hernia repair with excision of the subxiphoid subcutaneous nodule and the final pathology was consistent with metastatic renal cell carcinoma.  I had a lengthy discussion with the patient and his wife today about his condition and further investigation and treatment options. Based on the previous CT scan of the chest, abdomen and pelvis as well as the PET scan he has suspicious enlarging right lower lobe lung nodule that was hypermetabolic on the previous scan and this could be metachronous primary lung cancer versus metastatic renal cell carcinoma especially with the recent pathology from the subcutaneous nodule. I recommended for the patient to see Dr. Valeta Harms for consideration of bronchoscopy and biopsy of the enlarging right lower lobe nodule. I will see the patient back for follow-up visit in 1 months with repeat CT scan of the chest, abdomen and pelvis for more discussion of his treatment options based on the pathology and the imaging studies. For the smoking  cessation I strongly encouraged the patient to quit smoking. He was advised to call immediately if he has any concerning symptoms in the interval.  I strongly encouraged the patient to call immediately if he has any concerning chest pain or shortness of breath in the interval. Disclaimer: This note was dictated with voice recognition software. Similar sounding words can inadvertently be transcribed and may not be corrected upon review.

## 2021-09-05 ENCOUNTER — Telehealth: Payer: Self-pay | Admitting: Pulmonary Disease

## 2021-09-05 NOTE — Telephone Encounter (Signed)
PCCM:  I have called and tried to reach the patient.  I left a voicemail on his mobile telephone number as well as his wife's mobile telephone number.  I also called her home number.  If the patient calls back please let them know that I am happy to see [name] tomorrow in clinic at any time.  Okay to try to work him in tomorrow.  Garner Nash, DO [facility] 09/05/2021 2:50 PM

## 2021-09-05 NOTE — Telephone Encounter (Signed)
Left voicemail for patient to call back to schedule consult with Dr. Valeta Harms.

## 2021-09-06 ENCOUNTER — Telehealth: Payer: Self-pay | Admitting: Internal Medicine

## 2021-09-06 ENCOUNTER — Ambulatory Visit: Payer: Medicare Other | Admitting: Pulmonary Disease

## 2021-09-06 ENCOUNTER — Encounter: Payer: Self-pay | Admitting: Pulmonary Disease

## 2021-09-06 VITALS — BP 92/70 | HR 110 | Ht 69.0 in | Wt 168.0 lb

## 2021-09-06 DIAGNOSIS — Z85858 Personal history of malignant neoplasm of other endocrine glands: Secondary | ICD-10-CM

## 2021-09-06 DIAGNOSIS — Z85528 Personal history of other malignant neoplasm of kidney: Secondary | ICD-10-CM | POA: Diagnosis not present

## 2021-09-06 DIAGNOSIS — Z720 Tobacco use: Secondary | ICD-10-CM | POA: Diagnosis not present

## 2021-09-06 DIAGNOSIS — R911 Solitary pulmonary nodule: Secondary | ICD-10-CM

## 2021-09-06 NOTE — Telephone Encounter (Signed)
Scheduled per 08/30 los, patient has been called and notified.

## 2021-09-06 NOTE — H&P (View-Only) (Signed)
Synopsis: Referred in September 2023 for pulmonary nodule by Via, Lennette Bihari, MD  Subjective:   PATIENT ID: [name] GENDER: male DOB: Jul 08, 1950, MRN: [id number]  Chief Complaint  Patient presents with   Follow-up    This is a 71 year old gentleman, past medical history of adrenal cancer left, history of testicular cancer, history of renal cell cancer.  He was referred after having follow-up CT imaging from medical oncology.CT imaging completed on 06/26/2021 showed an interval increase in a 1.7 x 1.4 cm subsolid pulmonary nodule in the right lower lobe noted to be hypermetabolic on previous pet imaging.  This is concerning for metachronous primary lung neoplasm versus metastatic disease due to the patient's history.  Patient was referred for medical oncology for consideration of robotic assisted navigational bronchoscopy and tissue sampling for biopsy to confirm disease state.     Past Medical History:  Diagnosis Date   Adrenal cancer, left ([place])    Arthritis    Cancer of kidney ([place])    left   Complication of anesthesia    Very little movement of neck- "self fusioning"   COPD (chronic obstructive pulmonary disease) (HCC)    Crohn's disease ([place])    no issues since 2012   Difficulty sleeping    occasionally   Dyspnea    with a little exertion,  "smoked cigarettes all my life"   History of blood transfusion    "chron's flare"   Lung cancer ([place])    Renal cell carcinoma of left kidney ([place]) 02/20/2015   Renal mass    Renal mass, left    Sleep apnea 05/2016   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   s/p resection, no recurrence     Family History  Problem Relation Age of Onset   Asthma Brother    Testicular cancer Brother    Cancer Brother    Breast cancer Sister      Past Surgical History:  Procedure Laterality Date   APPENDECTOMY     COLONOSCOPY     HERNIA REPAIR     x 2   LAPAROSCOPIC NEPHRECTOMY Left 04/04/2013   Procedure: LAPAROSCOPIC LEFT RADICAL NEPHRECTOMY ;   Surgeon: [name], MD;  [facility];  Service: Urology;  Laterality: Left;   LUNG BIOPSY Left 08/08/2016   Procedure: LEFT LUNG BIOPSY;  Surgeon: Grace Isaac, MD;  Location: [facility];  Service: Thoracic;  Laterality: Left;   MASS EXCISION N/A 08/13/2021   Procedure: EXCISION OF SUBXIPHOID SUBCUTANEOUS  NODULE;  Surgeon: Johnathan Hausen, MD;  [facility];  Service: General;  Laterality: N/A;   ROBOTIC ADRENALECTOMY Left 02/17/2020   Procedure: XI ROBOTIC LEFT ADRENALECTOMY;  Surgeon: [name], MD;  [facility];  Service: Urology;  Laterality: Left;  REQUESTING 4HRS   testicular removal  2000   VENTRAL HERNIA REPAIR N/A 08/13/2021   Procedure: LAPAROSCOPIC VENTRAL HERNIA WITH MESH;  Surgeon: Johnathan Hausen, MD;  [facility];  Service: General;  Laterality: N/A;   VIDEO BRONCHOSCOPY N/A 11/30/2012   Procedure: VIDEO BRONCHOSCOPY WITH FLUORO;  Surgeon: Elsie Stain, MD;  [facility];  Service: Cardiopulmonary;  Laterality: N/A;   VIDEO BRONCHOSCOPY N/A 08/08/2016   Procedure: VIDEO BRONCHOSCOPY;  Surgeon: Grace Isaac, MD;  [facility];  Service: Thoracic;  Laterality: N/A;    Social History   Socioeconomic History   Marital status: Married    Spouse name: Not on file   Number of children: Not on file  Years of education: Not on file   Highest education level: Not on file  Occupational History   Occupation: Consultant  Tobacco Use   Smoking status: Former    Packs/day: 1.00    Years: 50.00    Total pack years: 50.00    Types: Cigarettes    Start date: 01/06/1966    Quit date: 07/08/2021    Years since quitting: 0.1   Smokeless tobacco: Never  Vaping Use   Vaping Use: Never used  Substance and Sexual Activity   Alcohol use: Not Currently   Drug use: No   Sexual activity: Not on file  Other Topics Concern   Not on file  Social History Narrative   Not on file   Social Determinants of Health   Financial Resource  Strain: Not on file  Food Insecurity: Not on file  Transportation Needs: Not on file  Physical Activity: Not on file  Stress: Not on file  Social Connections: Not on file  Intimate Partner Violence: Not on file     Allergies  Allergen Reactions   Ativan [Lorazepam] Other (See Comments)    Wife states " he became severally confused and combative"      Outpatient Medications Prior to Visit  Medication Sig Dispense Refill   Astaxanthin 4 MG CAPS Take 4 mg by mouth daily.     Oxycodone HCl 10 MG TABS Take 1 tablet (10 mg total) by mouth 5 (five) times daily. 15 tablet 0   Probiotic Product (PROBIOTIC DAILY PO) Take 270 mg by mouth daily. 70 Billion cfu/ 10 strains     Turmeric (QC TUMERIC COMPLEX) 500 MG CAPS Take 500 mg by mouth daily.     Vitamin D-Vitamin K (VITAMIN K2-VITAMIN D3 PO) Take 1 tablet by mouth daily. 125 mcg/ 180 mcg     Zinc Sulfate (ZINC 15 PO) Take 1 tablet by mouth daily. Zinc with Selenium 200 mcg     ondansetron (ZOFRAN) 4 MG tablet Take 1 tablet (4 mg total) by mouth daily as needed for nausea or vomiting. 90 each 1   oxyCODONE (OXY IR/ROXICODONE) 5 MG immediate release tablet Take 1 tablet (5 mg total) by mouth every 6 (six) hours as needed for severe pain. 15 tablet 0   No facility-administered medications prior to visit.    Review of Systems  Constitutional:  Negative for chills, fever, malaise/fatigue and weight loss.  HENT:  Negative for hearing loss, sore throat and tinnitus.   Eyes:  Negative for blurred vision and double vision.  Respiratory:  Negative for cough, hemoptysis, sputum production, shortness of breath, wheezing and stridor.   Cardiovascular:  Negative for chest pain, palpitations, orthopnea, leg swelling and PND.  Gastrointestinal:  Negative for abdominal pain, constipation, diarrhea, heartburn, nausea and vomiting.  Genitourinary:  Negative for dysuria, hematuria and urgency.  Musculoskeletal:  Negative for joint pain and myalgias.  Skin:   Negative for itching and rash.  Neurological:  Negative for dizziness, tingling, weakness and headaches.  Endo/Heme/Allergies:  Negative for environmental allergies. Does not bruise/bleed easily.  Psychiatric/Behavioral:  Negative for depression. The patient is not nervous/anxious and does not have insomnia.   All other systems reviewed and are negative.    Objective:  Physical Exam Vitals reviewed.  Constitutional:      General: He is not in acute distress.    Appearance: He is well-developed.  HENT:     Head: Normocephalic and atraumatic.  Eyes:     General: No scleral icterus.  Conjunctiva/sclera: Conjunctivae normal.     Pupils: Pupils are equal, round, and reactive to light.  Neck:     Vascular: No JVD.     Trachea: No tracheal deviation.  Cardiovascular:     Rate and Rhythm: Normal rate and regular rhythm.     Heart sounds: Normal heart sounds. No murmur heard. Pulmonary:     Effort: Pulmonary effort is normal. No tachypnea, accessory muscle usage or respiratory distress.     Breath sounds: No stridor. No wheezing, rhonchi or rales.  Abdominal:     General: There is no distension.     Palpations: Abdomen is soft.     Tenderness: There is no abdominal tenderness.     Comments: Recent incision for a nodule removal from his skin.  Musculoskeletal:        General: No tenderness.     Cervical back: Neck supple.  Lymphadenopathy:     Cervical: No cervical adenopathy.  Skin:    General: Skin is warm and dry.     Capillary Refill: Capillary refill takes less than 2 seconds.     Findings: No rash.  Neurological:     Mental Status: He is alert and oriented to person, place, and time.  Psychiatric:        Behavior: Behavior normal.      Vitals:   09/06/21 1425  BP: 92/70  Pulse: (!) 110  SpO2: 95%  Weight: 168 lb (76.2 kg)  Height: 5' 9"  (1.753 m)   95% on RA BMI Readings from Last 3 Encounters:  09/06/21 24.81 kg/m  09/04/21 26.17 kg/m  08/13/21 26.09  kg/m   Wt Readings from Last 3 Encounters:  09/06/21 168 lb (76.2 kg)  09/04/21 167 lb 1.6 oz (75.8 kg)  08/13/21 166 lb 9.6 oz (75.6 kg)     CBC    Component Value Date/Time   WBC 7.6 09/04/2021 1501   WBC 7.4 07/30/2021 1430   RBC 4.98 09/04/2021 1501   HGB 14.4 09/04/2021 1501   HGB 14.6 06/30/2016 1405   HCT 44.0 09/04/2021 1501   HCT 45.6 06/30/2016 1405   PLT 322 09/04/2021 1501   PLT 370 06/30/2016 1405   MCV 88.4 09/04/2021 1501   MCV 91.2 06/30/2016 1405   MCH 28.9 09/04/2021 1501   MCHC 32.7 09/04/2021 1501   RDW 13.2 09/04/2021 1501   RDW 14.5 06/30/2016 1405   LYMPHSABS 1.3 09/04/2021 1501   LYMPHSABS 1.1 06/30/2016 1405   MONOABS 0.5 09/04/2021 1501   MONOABS 0.5 06/30/2016 1405   EOSABS 0.2 09/04/2021 1501   EOSABS 0.1 06/30/2016 1405   BASOSABS 0.1 09/04/2021 1501   BASOSABS 0.0 06/30/2016 1405     Chest Imaging: CT chest June 2023: Right lower lobe cavitating lobulated nodule concerning for malignancy. The patient's images have been independently reviewed by me.    Pulmonary Functions Testing Results:    Latest Ref Rng & Units 09/17/2016    2:42 PM  PFT Results  FVC-Pre L 2.83   FVC-Predicted Pre % 70   FVC-Post L 3.10   FVC-Predicted Post % 77   Pre FEV1/FVC % % 60   Post FEV1/FCV % % 63   FEV1-Pre L 1.69   FEV1-Predicted Pre % 57   FEV1-Post L 1.96   DLCO uncorrected ml/min/mmHg 18.53   DLCO UNC% % 65   DLCO corrected ml/min/mmHg 20.18   DLCO COR %Predicted % 71   DLVA Predicted % 90   TLC L 6.12  TLC % Predicted % 95   RV % Predicted % 138     FeNO:   Pathology:   Echocardiogram:   Heart Catheterization:     Assessment & Plan:     ICD-10-CM   1. Lung nodule  R91.1 Procedural/ Surgical Case Request: ROBOTIC ASSISTED NAVIGATIONAL BRONCHOSCOPY    Ambulatory referral to Pulmonology    Novel Coronavirus, NAA ([facility])    2. History of adrenal cancer  Z85.858     3. History of renal cell cancer  Z85.528     4. Tobacco  use  Z72.0       Discussion:  This is a 71 year old gentleman, history of adrenal cancer, history of renal cell cancer, tobacco use.  Has a slowly enlarging right lower lobe pulmonary nodule concerning for malignancy.  The appears to have morphology similar to a primary lung cancer.  Plan: I think next a step for the patient would be consideration for biopsy. He was referred here for medical oncology for tissue biopsy. He has a repeat CT ordered. We will talk about the pros cons risk benefits of robotic assisted navigational bronchoscopy. Patient is agreeable to proceed. We talked about the risk of bleeding and pneumothorax. Tentative bronchoscopy date on 09/24/2021.    Current Outpatient Medications:    Astaxanthin 4 MG CAPS, Take 4 mg by mouth daily., Disp: , Rfl:    Oxycodone HCl 10 MG TABS, Take 1 tablet (10 mg total) by mouth 5 (five) times daily., Disp: 15 tablet, Rfl: 0   Probiotic Product (PROBIOTIC DAILY PO), Take 270 mg by mouth daily. 70 Billion cfu/ 10 strains, Disp: , Rfl:    Turmeric (QC TUMERIC COMPLEX) 500 MG CAPS, Take 500 mg by mouth daily., Disp: , Rfl:    Vitamin D-Vitamin K (VITAMIN K2-VITAMIN D3 PO), Take 1 tablet by mouth daily. 125 mcg/ 180 mcg, Disp: , Rfl:    Zinc Sulfate (ZINC 15 PO), Take 1 tablet by mouth daily. Zinc with Selenium 200 mcg, Disp: , Rfl:   I spent 62 minutes dedicated to the care of this patient on the date of this encounter to include pre-visit review of records, face-to-face time with the patient discussing conditions above, post visit ordering of testing, clinical documentation with the electronic health record, making appropriate referrals as documented, and communicating necessary findings to members of the patients care team.   Garner Nash, [facility] 09/06/2021 2:54 PM

## 2021-09-06 NOTE — Patient Instructions (Addendum)
Thank you for visiting Dr. Valeta Harms at [facility]. Today we recommend the following:  Orders Placed This Encounter  Procedures   Procedural/ Surgical Case Request: ROBOTIC ASSISTED NAVIGATIONAL BRONCHOSCOPY   Ambulatory referral to Pulmonology   Bronchoscopy on 09/24/2021  Return in about 25 days (around 10/01/2021) for with Eric Form, NP.    Please do your part to reduce the spread of COVID-19.

## 2021-09-06 NOTE — Progress Notes (Signed)
Synopsis: Referred in September 2023 for pulmonary nodule by Via, Lennette Bihari, MD  Subjective:   PATIENT ID: [name] GENDER: male DOB: 1950/10/17, MRN: [id number]  Chief Complaint  Patient presents with   Follow-up    This is a 71 year old gentleman, past medical history of adrenal cancer left, history of testicular cancer, history of renal cell cancer.  He was referred after having follow-up CT imaging from medical oncology.CT imaging completed on 06/26/2021 showed an interval increase in a 1.7 x 1.4 cm subsolid pulmonary nodule in the right lower lobe noted to be hypermetabolic on previous pet imaging.  This is concerning for metachronous primary lung neoplasm versus metastatic disease due to the patient's history.  Patient was referred for medical oncology for consideration of robotic assisted navigational bronchoscopy and tissue sampling for biopsy to confirm disease state.     Past Medical History:  Diagnosis Date   Adrenal cancer, left ([place])    Arthritis    Cancer of kidney ([place])    left   Complication of anesthesia    Very little movement of neck- "self fusioning"   COPD (chronic obstructive pulmonary disease) ([facility])    Crohn's disease ([place])    no issues since 2012   Difficulty sleeping    occasionally   Dyspnea    with a little exertion,  "smoked cigarettes all my life"   History of blood transfusion    "chron's flare"   Lung cancer ([place])    Renal cell carcinoma of left kidney ([place]) 02/20/2015   Renal mass    Renal mass, left    Sleep apnea 05/2016   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   s/p resection, no recurrence     Family History  Problem Relation Age of Onset   Asthma Brother    Testicular cancer Brother    Cancer Brother    Breast cancer Sister      Past Surgical History:  Procedure Laterality Date   APPENDECTOMY     COLONOSCOPY     HERNIA REPAIR     x 2   LAPAROSCOPIC NEPHRECTOMY Left 04/04/2013   Procedure: LAPAROSCOPIC LEFT RADICAL NEPHRECTOMY ;   Surgeon: [name], MD;  Location: [facility];  Service: Urology;  Laterality: Left;   LUNG BIOPSY Left 08/08/2016   Procedure: LEFT LUNG BIOPSY;  Surgeon: Grace Isaac, MD;  Location: [facility];  Service: Thoracic;  Laterality: Left;   MASS EXCISION N/A 08/13/2021   Procedure: EXCISION OF SUBXIPHOID SUBCUTANEOUS  NODULE;  Surgeon: Johnathan Hausen, MD;  Location: [facility];  Service: General;  Laterality: N/A;   ROBOTIC ADRENALECTOMY Left 02/17/2020   Procedure: XI ROBOTIC LEFT ADRENALECTOMY;  Surgeon: [name], MD;  Location: [facility];  Service: Urology;  Laterality: Left;  REQUESTING 4HRS   testicular removal  2000   VENTRAL HERNIA REPAIR N/A 08/13/2021   Procedure: LAPAROSCOPIC VENTRAL HERNIA WITH MESH;  Surgeon: Johnathan Hausen, MD;  Location: [facility];  Service: General;  Laterality: N/A;   VIDEO BRONCHOSCOPY N/A 11/30/2012   Procedure: VIDEO BRONCHOSCOPY WITH FLUORO;  Surgeon: Elsie Stain, MD;  [facility];  Service: Cardiopulmonary;  Laterality: N/A;   VIDEO BRONCHOSCOPY N/A 08/08/2016   Procedure: VIDEO BRONCHOSCOPY;  Surgeon: Grace Isaac, MD;  Location: [facility];  Service: Thoracic;  Laterality: N/A;    Social History   Socioeconomic History   Marital status: Married    Spouse name: Not on file   Number of children: Not on file  Years of education: Not on file   Highest education level: Not on file  Occupational History   Occupation: Consultant  Tobacco Use   Smoking status: Former    Packs/day: 1.00    Years: 50.00    Total pack years: 50.00    Types: Cigarettes    Start date: 01/06/1966    Quit date: 07/08/2021    Years since quitting: 0.1   Smokeless tobacco: Never  Vaping Use   Vaping Use: Never used  Substance and Sexual Activity   Alcohol use: Not Currently   Drug use: No   Sexual activity: Not on file  Other Topics Concern   Not on file  Social History Narrative   Not on file   Social Determinants of Health   Financial Resource  Strain: Not on file  Food Insecurity: Not on file  Transportation Needs: Not on file  Physical Activity: Not on file  Stress: Not on file  Social Connections: Not on file  Intimate Partner Violence: Not on file     Allergies  Allergen Reactions   Ativan [Lorazepam] Other (See Comments)    Wife states " he became severally confused and combative"      Outpatient Medications Prior to Visit  Medication Sig Dispense Refill   Astaxanthin 4 MG CAPS Take 4 mg by mouth daily.     Oxycodone HCl 10 MG TABS Take 1 tablet (10 mg total) by mouth 5 (five) times daily. 15 tablet 0   Probiotic Product (PROBIOTIC DAILY PO) Take 270 mg by mouth daily. 70 Billion cfu/ 10 strains     Turmeric (QC TUMERIC COMPLEX) 500 MG CAPS Take 500 mg by mouth daily.     Vitamin D-Vitamin K (VITAMIN K2-VITAMIN D3 PO) Take 1 tablet by mouth daily. 125 mcg/ 180 mcg     Zinc Sulfate (ZINC 15 PO) Take 1 tablet by mouth daily. Zinc with Selenium 200 mcg     ondansetron (ZOFRAN) 4 MG tablet Take 1 tablet (4 mg total) by mouth daily as needed for nausea or vomiting. 90 each 1   oxyCODONE (OXY IR/ROXICODONE) 5 MG immediate release tablet Take 1 tablet (5 mg total) by mouth every 6 (six) hours as needed for severe pain. 15 tablet 0   No facility-administered medications prior to visit.    Review of Systems  Constitutional:  Negative for chills, fever, malaise/fatigue and weight loss.  HENT:  Negative for hearing loss, sore throat and tinnitus.   Eyes:  Negative for blurred vision and double vision.  Respiratory:  Negative for cough, hemoptysis, sputum production, shortness of breath, wheezing and stridor.   Cardiovascular:  Negative for chest pain, palpitations, orthopnea, leg swelling and PND.  Gastrointestinal:  Negative for abdominal pain, constipation, diarrhea, heartburn, nausea and vomiting.  Genitourinary:  Negative for dysuria, hematuria and urgency.  Musculoskeletal:  Negative for joint pain and myalgias.  Skin:   Negative for itching and rash.  Neurological:  Negative for dizziness, tingling, weakness and headaches.  Endo/Heme/Allergies:  Negative for environmental allergies. Does not bruise/bleed easily.  Psychiatric/Behavioral:  Negative for depression. The patient is not nervous/anxious and does not have insomnia.   All other systems reviewed and are negative.    Objective:  Physical Exam Vitals reviewed.  Constitutional:      General: He is not in acute distress.    Appearance: He is well-developed.  HENT:     Head: Normocephalic and atraumatic.  Eyes:     General: No scleral icterus.  Conjunctiva/sclera: Conjunctivae normal.     Pupils: Pupils are equal, round, and reactive to light.  Neck:     Vascular: No JVD.     Trachea: No tracheal deviation.  Cardiovascular:     Rate and Rhythm: Normal rate and regular rhythm.     Heart sounds: Normal heart sounds. No murmur heard. Pulmonary:     Effort: Pulmonary effort is normal. No tachypnea, accessory muscle usage or respiratory distress.     Breath sounds: No stridor. No wheezing, rhonchi or rales.  Abdominal:     General: There is no distension.     Palpations: Abdomen is soft.     Tenderness: There is no abdominal tenderness.     Comments: Recent incision for a nodule removal from his skin.  Musculoskeletal:        General: No tenderness.     Cervical back: Neck supple.  Lymphadenopathy:     Cervical: No cervical adenopathy.  Skin:    General: Skin is warm and dry.     Capillary Refill: Capillary refill takes less than 2 seconds.     Findings: No rash.  Neurological:     Mental Status: He is alert and oriented to person, place, and time.  Psychiatric:        Behavior: Behavior normal.      Vitals:   09/06/21 1425  BP: 92/70  Pulse: (!) 110  SpO2: 95%  Weight: 168 lb (76.2 kg)  Height: 5' 9"  (1.753 m)   95% on RA BMI Readings from Last 3 Encounters:  09/06/21 24.81 kg/m  09/04/21 26.17 kg/m  08/13/21 26.09  kg/m   Wt Readings from Last 3 Encounters:  09/06/21 168 lb (76.2 kg)  09/04/21 167 lb 1.6 oz (75.8 kg)  08/13/21 166 lb 9.6 oz (75.6 kg)     CBC    Component Value Date/Time   WBC 7.6 09/04/2021 1501   WBC 7.4 07/30/2021 1430   RBC 4.98 09/04/2021 1501   HGB 14.4 09/04/2021 1501   HGB 14.6 06/30/2016 1405   HCT 44.0 09/04/2021 1501   HCT 45.6 06/30/2016 1405   PLT 322 09/04/2021 1501   PLT 370 06/30/2016 1405   MCV 88.4 09/04/2021 1501   MCV 91.2 06/30/2016 1405   MCH 28.9 09/04/2021 1501   MCHC 32.7 09/04/2021 1501   RDW 13.2 09/04/2021 1501   RDW 14.5 06/30/2016 1405   LYMPHSABS 1.3 09/04/2021 1501   LYMPHSABS 1.1 06/30/2016 1405   MONOABS 0.5 09/04/2021 1501   MONOABS 0.5 06/30/2016 1405   EOSABS 0.2 09/04/2021 1501   EOSABS 0.1 06/30/2016 1405   BASOSABS 0.1 09/04/2021 1501   BASOSABS 0.0 06/30/2016 1405     Chest Imaging: CT chest June 2023: Right lower lobe cavitating lobulated nodule concerning for malignancy. The patient's images have been independently reviewed by me.    Pulmonary Functions Testing Results:    Latest Ref Rng & Units 09/17/2016    2:42 PM  PFT Results  FVC-Pre L 2.83   FVC-Predicted Pre % 70   FVC-Post L 3.10   FVC-Predicted Post % 77   Pre FEV1/FVC % % 60   Post FEV1/FCV % % 63   FEV1-Pre L 1.69   FEV1-Predicted Pre % 57   FEV1-Post L 1.96   DLCO uncorrected ml/min/mmHg 18.53   DLCO UNC% % 65   DLCO corrected ml/min/mmHg 20.18   DLCO COR %Predicted % 71   DLVA Predicted % 90   TLC L 6.12  TLC % Predicted % 95   RV % Predicted % 138     FeNO:   Pathology:   Echocardiogram:   Heart Catheterization:     Assessment & Plan:     ICD-10-CM   1. Lung nodule  R91.1 Procedural/ Surgical Case Request: ROBOTIC ASSISTED NAVIGATIONAL BRONCHOSCOPY    Ambulatory referral to Pulmonology    Novel Coronavirus, NAA ([facility])    2. History of adrenal cancer  Z85.858     3. History of renal cell cancer  Z85.528     4. Tobacco  use  Z72.0       Discussion:  This is a 71 year old gentleman, history of adrenal cancer, history of renal cell cancer, tobacco use.  Has a slowly enlarging right lower lobe pulmonary nodule concerning for malignancy.  The appears to have morphology similar to a primary lung cancer.  Plan: I think next a step for the patient would be consideration for biopsy. He was referred here for medical oncology for tissue biopsy. He has a repeat CT ordered. We will talk about the pros cons risk benefits of robotic assisted navigational bronchoscopy. Patient is agreeable to proceed. We talked about the risk of bleeding and pneumothorax. Tentative bronchoscopy date on 09/24/2021.    Current Outpatient Medications:    Astaxanthin 4 MG CAPS, Take 4 mg by mouth daily., Disp: , Rfl:    Oxycodone HCl 10 MG TABS, Take 1 tablet (10 mg total) by mouth 5 (five) times daily., Disp: 15 tablet, Rfl: 0   Probiotic Product (PROBIOTIC DAILY PO), Take 270 mg by mouth daily. 70 Billion cfu/ 10 strains, Disp: , Rfl:    Turmeric (QC TUMERIC COMPLEX) 500 MG CAPS, Take 500 mg by mouth daily., Disp: , Rfl:    Vitamin D-Vitamin K (VITAMIN K2-VITAMIN D3 PO), Take 1 tablet by mouth daily. 125 mcg/ 180 mcg, Disp: , Rfl:    Zinc Sulfate (ZINC 15 PO), Take 1 tablet by mouth daily. Zinc with Selenium 200 mcg, Disp: , Rfl:   I spent 62 minutes dedicated to the care of this patient on the date of this encounter to include pre-visit review of records, face-to-face time with the patient discussing conditions above, post visit ordering of testing, clinical documentation with the electronic health record, making appropriate referrals as documented, and communicating necessary findings to members of the patients care team.   Garner Nash, [facility] 09/06/2021 2:54 PM

## 2021-09-18 ENCOUNTER — Other Ambulatory Visit: Payer: Self-pay | Admitting: Pulmonary Disease

## 2021-09-18 DIAGNOSIS — R911 Solitary pulmonary nodule: Secondary | ICD-10-CM

## 2021-09-18 NOTE — Progress Notes (Unsigned)
PCCM:  Super D CT chest orders placed   Garner Nash, DO [facility] 09/18/2021 12:36 PM

## 2021-09-20 ENCOUNTER — Other Ambulatory Visit: Payer: Medicare Other

## 2021-09-20 DIAGNOSIS — R911 Solitary pulmonary nodule: Secondary | ICD-10-CM

## 2021-09-22 LAB — NOVEL CORONAVIRUS, NAA: SARS-CoV-2, NAA: NOT DETECTED

## 2021-09-22 LAB — SPECIMEN STATUS REPORT

## 2021-09-23 ENCOUNTER — Encounter (HOSPITAL_COMMUNITY): Payer: Self-pay | Admitting: Pulmonary Disease

## 2021-09-23 ENCOUNTER — Other Ambulatory Visit: Payer: Self-pay

## 2021-09-23 NOTE — Progress Notes (Signed)
PCP -[facility]   CPAP - Does not believe he has it and does not have a machine  ERAS Protcol - NPO  COVID TEST- Neg 09/20/21  Anesthesia review: N  Patient verbally denies any shortness of breath, fever, cough and chest pain during phone call   -------------  SDW INSTRUCTIONS given:  Your procedure is scheduled on 09/24/21.  Report to [facility] Main Entrance "A" at 0830 A.M., and check in at the Admitting office.  Call this number if you have problems the morning of surgery:  [phone]   Remember:  Do not eat or drink after midnight the night before your surgery     Take these medicines the morning of surgery with A SIP OF WATER  Oxycodone   As of today, STOP taking any Aspirin (unless otherwise instructed by your surgeon) Aleve, Naproxen, Ibuprofen, Motrin, Advil, Goody's, BC's, all herbal medications, fish oil, and all vitamins.                      Do not wear jewelry, make up, or nail polish            Do not wear lotions, powders, perfumes/colognes, or deodorant.            Do not shave 48 hours prior to surgery.  Men may shave face and neck.            Do not bring valuables to the hospital.            [facility] is not responsible for any belongings or valuables.  Do NOT Smoke (Tobacco/Vaping) 24 hours prior to your procedure If you use a CPAP at night, you may bring all equipment for your overnight stay.   Contacts, glasses, dentures or bridgework may not be worn into surgery.      For patients admitted to the hospital, discharge time will be determined by your treatment team.   Patients discharged the day of surgery will not be allowed to drive home, and someone needs to stay with them for 24 hours.    Special instructions:   [facility]- Preparing For Surgery  Before surgery, you can play an important role. Because skin is not sterile, your skin needs to be as free of germs as possible. You can reduce the number of germs on your skin by washing  with CHG (chlorahexidine gluconate) Soap before surgery.  CHG is an antiseptic cleaner which kills germs and bonds with the skin to continue killing germs even after washing.    Oral Hygiene is also important to reduce your risk of infection.  Remember - BRUSH YOUR TEETH THE MORNING OF SURGERY WITH YOUR REGULAR TOOTHPASTE  Please do not use if you have an allergy to CHG or antibacterial soaps. If your skin becomes reddened/irritated stop using the CHG.  Do not shave (including legs and underarms) for at least 48 hours prior to first CHG shower. It is OK to shave your face.  Please follow these instructions carefully.   Shower the NIGHT BEFORE SURGERY and the MORNING OF SURGERY with DIAL Soap.   Pat yourself dry with a CLEAN TOWEL.  Wear CLEAN PAJAMAS to bed the night before surgery  Place CLEAN SHEETS on your bed the night of your first shower and DO NOT SLEEP WITH PETS.   Day of Surgery: Please shower morning of surgery  Wear Clean/Comfortable clothing the morning of surgery Do not apply any deodorants/lotions.   Remember to brush  your teeth WITH YOUR REGULAR TOOTHPASTE.   Questions were answered. Patient verbalized understanding of instructions.

## 2021-09-24 ENCOUNTER — Ambulatory Visit (HOSPITAL_COMMUNITY): Payer: Medicare Other

## 2021-09-24 ENCOUNTER — Encounter (HOSPITAL_COMMUNITY)
Admission: RE | Disposition: A | Discharge status: Home / Self Care | Payer: Self-pay | Source: Home / Self Care | Attending: Pulmonary Disease

## 2021-09-24 ENCOUNTER — Ambulatory Visit (HOSPITAL_COMMUNITY)
Admission: RE | Admit: 2021-09-24 | Discharge: 2021-09-24 | Disposition: A | Discharge status: Home / Self Care | Payer: Medicare Other | Attending: Pulmonary Disease | Admitting: Pulmonary Disease

## 2021-09-24 ENCOUNTER — Ambulatory Visit (HOSPITAL_BASED_OUTPATIENT_CLINIC_OR_DEPARTMENT_OTHER): Payer: Medicare Other | Admitting: Anesthesiology

## 2021-09-24 ENCOUNTER — Encounter (HOSPITAL_COMMUNITY): Payer: Self-pay | Admitting: Pulmonary Disease

## 2021-09-24 ENCOUNTER — Ambulatory Visit (HOSPITAL_COMMUNITY): Payer: Medicare Other | Admitting: Anesthesiology

## 2021-09-24 DIAGNOSIS — C3431 Malignant neoplasm of lower lobe, right bronchus or lung: Secondary | ICD-10-CM | POA: Insufficient documentation

## 2021-09-24 DIAGNOSIS — R911 Solitary pulmonary nodule: Secondary | ICD-10-CM | POA: Diagnosis present

## 2021-09-24 DIAGNOSIS — J449 Chronic obstructive pulmonary disease, unspecified: Secondary | ICD-10-CM

## 2021-09-24 DIAGNOSIS — Z8547 Personal history of malignant neoplasm of testis: Secondary | ICD-10-CM | POA: Diagnosis not present

## 2021-09-24 DIAGNOSIS — Z85528 Personal history of other malignant neoplasm of kidney: Secondary | ICD-10-CM | POA: Insufficient documentation

## 2021-09-24 DIAGNOSIS — Z87891 Personal history of nicotine dependence: Secondary | ICD-10-CM | POA: Insufficient documentation

## 2021-09-24 DIAGNOSIS — G4730 Sleep apnea, unspecified: Secondary | ICD-10-CM | POA: Diagnosis not present

## 2021-09-24 HISTORY — PX: BRONCHIAL BIOPSY: SHX5109

## 2021-09-24 HISTORY — PX: FIDUCIAL MARKER PLACEMENT: SHX6858

## 2021-09-24 SURGERY — BRONCHOSCOPY, WITH BIOPSY USING ELECTROMAGNETIC NAVIGATION
Anesthesia: General | Laterality: Right

## 2021-09-24 MED ORDER — MIDAZOLAM HCL 2 MG/2ML IJ SOLN: 0.5000 mg | Freq: Once | INTRAMUSCULAR | Status: DC | PRN

## 2021-09-24 MED ORDER — LACTATED RINGERS IV SOLN: INTRAVENOUS | Status: DC

## 2021-09-24 MED ORDER — PHENYLEPHRINE 80 MCG/ML (10ML) SYRINGE FOR IV PUSH (FOR BLOOD PRESSURE SUPPORT)
PREFILLED_SYRINGE | INTRAVENOUS | Status: DC | PRN
  Administered 2021-09-24: 80 ug via INTRAVENOUS
  Administered 2021-09-24: 120 ug via INTRAVENOUS
  Administered 2021-09-24 (×3): 80 ug via INTRAVENOUS

## 2021-09-24 MED ORDER — DEXAMETHASONE SODIUM PHOSPHATE 10 MG/ML IJ SOLN
INTRAMUSCULAR | Status: DC | PRN
  Administered 2021-09-24: 10 mg via INTRAVENOUS

## 2021-09-24 MED ORDER — FENTANYL CITRATE (PF) 100 MCG/2ML IJ SOLN: 25.0000 ug | INTRAMUSCULAR | Status: DC | PRN

## 2021-09-24 MED ORDER — OXYCODONE HCL 5 MG/5ML PO SOLN: 5.0000 mg | Freq: Once | ORAL | Status: DC | PRN

## 2021-09-24 MED ORDER — MEPERIDINE HCL 25 MG/ML IJ SOLN: 6.2500 mg | INTRAMUSCULAR | Status: DC | PRN

## 2021-09-24 MED ORDER — ROCURONIUM BROMIDE 10 MG/ML (PF) SYRINGE
PREFILLED_SYRINGE | INTRAVENOUS | Status: DC | PRN
  Administered 2021-09-24: 60 mg via INTRAVENOUS

## 2021-09-24 MED ORDER — PROMETHAZINE HCL 25 MG/ML IJ SOLN: 6.2500 mg | INTRAMUSCULAR | Status: DC | PRN

## 2021-09-24 MED ORDER — FENTANYL CITRATE (PF) 250 MCG/5ML IJ SOLN
INTRAMUSCULAR | Status: DC | PRN
  Administered 2021-09-24: 50 ug via INTRAVENOUS

## 2021-09-24 MED ORDER — CHLORHEXIDINE GLUCONATE 0.12 % MT SOLN
OROMUCOSAL | Status: AC
  Filled 2021-09-24: qty 15

## 2021-09-24 MED ORDER — SUGAMMADEX SODIUM 200 MG/2ML IV SOLN
INTRAVENOUS | Status: DC | PRN
  Administered 2021-09-24: 200 mg via INTRAVENOUS

## 2021-09-24 MED ORDER — ACETAMINOPHEN 500 MG PO TABS
1000.0000 mg | ORAL_TABLET | Freq: Once | ORAL | Status: AC
Start: 2021-09-24 — End: 2021-09-24
  Administered 2021-09-24: 1000 mg via ORAL
  Filled 2021-09-24: qty 2

## 2021-09-24 MED ORDER — PROPOFOL 10 MG/ML IV BOLUS
INTRAVENOUS | Status: DC | PRN
  Administered 2021-09-24: 90 mg via INTRAVENOUS

## 2021-09-24 MED ORDER — ONDANSETRON HCL 4 MG/2ML IJ SOLN
INTRAMUSCULAR | Status: DC | PRN
  Administered 2021-09-24: 4 mg via INTRAVENOUS

## 2021-09-24 MED ORDER — LIDOCAINE 2% (20 MG/ML) 5 ML SYRINGE
INTRAMUSCULAR | Status: DC | PRN
  Administered 2021-09-24: 40 mg via INTRAVENOUS

## 2021-09-24 MED ORDER — OXYCODONE HCL 5 MG PO TABS: 5.0000 mg | ORAL_TABLET | Freq: Once | ORAL | Status: DC | PRN

## 2021-09-24 SURGICAL SUPPLY — 1 items: fiducial IMPLANT

## 2021-09-24 NOTE — Transfer of Care (Signed)
Immediate Anesthesia Transfer of Care Note  Patient: [name]  Procedure(s) Performed: ROBOTIC ASSISTED NAVIGATIONAL BRONCHOSCOPY (Right) BRONCHIAL BIOPSIES FIDUCIAL MARKER PLACEMENT  Patient Location: PACU  Anesthesia Type:General  Level of Consciousness: awake, alert  and oriented  Airway & Oxygen Therapy: Patient Spontanous Breathing  Post-op Assessment: Report given to RN and Post -op Vital signs reviewed and stable  Post vital signs: Reviewed and stable  Last Vitals:  Vitals Value Taken Time  BP 130/75 09/24/21 1145  Temp 36.7 C 09/24/21 1145  Pulse 73 09/24/21 1147  Resp 13 09/24/21 1147  SpO2 94 % 09/24/21 1147  Vitals shown include unvalidated device data.  Last Pain:  Vitals:   09/24/21 0925  TempSrc:   PainSc: 0-No pain         Complications:  Encounter Notable Events  Notable Event Outcome Phase Comment  Difficult to intubate - expected  Intraprocedure Filed from anesthesia note documentation.

## 2021-09-24 NOTE — Anesthesia Procedure Notes (Signed)
Procedure Name: Intubation Date/Time: 09/24/2021 10:25 AM  Performed by: Betha Loa, CRNAPre-anesthesia Checklist: Patient identified, Emergency Drugs available, Suction available and Patient being monitored Patient Re-evaluated:Patient Re-evaluated prior to induction Oxygen Delivery Method: Circle System Utilized Preoxygenation: Pre-oxygenation with 100% oxygen Induction Type: IV induction Ventilation: Mask ventilation without difficulty Laryngoscope Size: Mac and 4 Grade View: Grade I Tube type: Oral Number of attempts: 1 Airway Equipment and Method: Rigid stylet and Video-laryngoscopy Placement Confirmation: ETT inserted through vocal cords under direct vision, positive ETCO2 and breath sounds checked- equal and bilateral Secured at: 24 cm Tube secured with: Tape Dental Injury: Teeth and Oropharynx as per pre-operative assessment  Difficulty Due To: Difficulty was anticipated and Difficult Airway- due to reduced neck mobility

## 2021-09-24 NOTE — Discharge Instructions (Signed)
Flexible Bronchoscopy, Care After This sheet gives you information about how to care for yourself after your test. Your doctor may also give you more specific instructions. If you have problems or questions, contact your doctor. Follow these instructions at home: Eating and drinking Do not eat or drink anything (not even water) for 2 hours after your test, or until your numbing medicine (local anesthetic) wears off. When your numbness is gone and your cough and gag reflexes have come back, you may: Eat only soft foods. Slowly drink liquids. The day after the test, go back to your normal diet. Driving Do not drive for 24 hours if you were given a medicine to help you relax (sedative). Do not drive or use heavy machinery while taking prescription pain medicine. General instructions  Take over-the-counter and prescription medicines only as told by your doctor. Return to your normal activities as told. Ask what activities are safe for you. Do not use any products that have nicotine or tobacco in them. This includes cigarettes and e-cigarettes. If you need help quitting, ask your doctor. Keep all follow-up visits as told by your doctor. This is important. It is very important if you had a tissue sample (biopsy) taken. Get help right away if: You have shortness of breath that gets worse. You get light-headed. You feel like you are going to pass out (faint). You have chest pain. You cough up: More than a little blood. More blood than before. Summary Do not eat or drink anything (not even water) for 2 hours after your test, or until your numbing medicine wears off. Do not use cigarettes. Do not use e-cigarettes. Get help right away if you have chest pain.  This information is not intended to replace advice given to you by your health care provider. Make sure you discuss any questions you have with your health care provider. Document Released: 10/20/2008 Document Revised: 12/05/2016 Document  Reviewed: 01/11/2016 Elsevier Patient Education  2020 [employer].

## 2021-09-24 NOTE — Interval H&P Note (Signed)
History and Physical Interval Note:  09/24/2021 9:08 AM  [name]  has presented today for surgery, with the diagnosis of lung nodule.  The various methods of treatment have been discussed with the patient and family. After consideration of risks, benefits and other options for treatment, the patient has consented to  Procedure(s) with comments: ROBOTIC ASSISTED NAVIGATIONAL BRONCHOSCOPY (Right) - Ion w/ CIOS as a surgical intervention.  The patient's history has been reviewed, patient examined, no change in status, stable for surgery.  I have reviewed the patient's chart and labs.  Questions were answered to the patient's satisfaction.     [place]

## 2021-09-24 NOTE — Op Note (Signed)
Video Bronchoscopy with Robotic Assisted Bronchoscopic Navigation   Date of Operation: 09/24/2021   Pre-op Diagnosis: Right lower lobe lung nodule  Post-op Diagnosis: Right lower lobe lung nodule  Surgeon: Garner Nash, DO   Assistants: None   Anesthesia: General endotracheal anesthesia  Operation: Flexible video fiberoptic bronchoscopy with robotic assistance and biopsies.  Estimated Blood Loss: Minimal  Complications: None  Indications and History: [name] is a 71 y.o. male with history of right lower lobe lung nodule. The risks, benefits, complications, treatment options and expected outcomes were discussed with the patient.  The possibilities of pneumothorax, pneumonia, reaction to medication, pulmonary aspiration, perforation of a viscus, bleeding, failure to diagnose a condition and creating a complication requiring transfusion or operation were discussed with the patient who freely signed the consent.    Description of Procedure: The patient was seen in the Preoperative Area, was examined and was deemed appropriate to proceed.  The patient was taken to [facility] endoscopy room 3, identified as [name] and the procedure verified as Flexible Video Fiberoptic Bronchoscopy.  A Time Out was held and the above information confirmed.   Prior to the date of the procedure a high-resolution CT scan of the chest was performed. Utilizing ION software program a virtual tracheobronchial tree was generated to allow the creation of distinct navigation pathways to the patient's parenchymal abnormalities. After being taken to the operating room general anesthesia was initiated and the patient  was orally intubated. The video fiberoptic bronchoscope was introduced via the endotracheal tube and a general inspection was performed which showed normal right and left lung anatomy, aspiration of the bilateral mainstems was completed to remove any remaining secretions. Robotic catheter inserted  into patient's endotracheal tube.   Target #1 right lower lobe lung nodule: The distinct navigation pathways prepared prior to this procedure were then utilized to navigate to patient's lesion identified on CT scan. The robotic catheter was secured into place and the vision probe was withdrawn.  Lesion location was approximated using fluoroscopy and three-dimensional cone beam CT imaging. Under fluoroscopic guidance transbronchial forceps biopsies were performed to be sent for cytology and pathology.  At the end of the procedure a general airway inspection was performed and there was no evidence of active bleeding. The bronchoscope was removed.  The patient tolerated the procedure well. There was no significant blood loss and there were no obvious complications. A post-procedural chest x-ray is pending.  Samples Target #1: 1. Transbronchial forceps biopsies from RLL  Plans:  The patient will be discharged from the PACU to home when recovered from anesthesia and after chest x-ray is reviewed. We will review the cytology, pathology results with the patient when they become available. Outpatient followup will be with Garner Nash, DO.   Garner Nash, DO [facility] 09/24/2021 11:46 AM

## 2021-09-24 NOTE — Anesthesia Preprocedure Evaluation (Addendum)
Anesthesia Evaluation  Patient identified by MRN, date of birth, ID band Patient awake    Reviewed: Allergy & Precautions, NPO status , Patient's Chart, lab work & pertinent test results  History of Anesthesia Complications Negative for: history of anesthetic complications  Airway Mallampati: III  TM Distance: >3 FB Neck ROM: Limited   Comment: ankylosed Dental  (+) Dental Advisory Given   Pulmonary sleep apnea (does not use CPAP) , COPD, Patient abstained from smoking., former smoker,    breath sounds clear to auscultation       Cardiovascular negative cardio ROS   Rhythm:Regular Rate:Normal     Neuro/Psych negative neurological ROS     GI/Hepatic Neg liver ROS, Crohn's   Endo/Other  negative endocrine ROS  Renal/GU Renal cancer, adrenal cancer     Musculoskeletal  (+) Arthritis ,   Abdominal   Peds  Hematology negative hematology ROS (+)   Anesthesia Other Findings   Reproductive/Obstetrics H/o testicular cancer                            Anesthesia Physical Anesthesia Plan  ASA: 3  Anesthesia Plan: General   Post-op Pain Management: Tylenol PO (pre-op)*   Induction: Intravenous  PONV Risk Score and Plan: 2 and Ondansetron and Dexamethasone  Airway Management Planned: Oral ETT and Video Laryngoscope Planned  Additional Equipment: None  Intra-op Plan:   Post-operative Plan: Extubation in OR  Informed Consent: I have reviewed the patients History and Physical, chart, labs and discussed the procedure including the risks, benefits and alternatives for the proposed anesthesia with the patient or authorized representative who has indicated his/her understanding and acceptance.     Dental advisory given  Plan Discussed with: CRNA and Surgeon  Anesthesia Plan Comments:         Anesthesia Quick Evaluation

## 2021-09-24 NOTE — Anesthesia Postprocedure Evaluation (Signed)
Anesthesia Post Note  Patient: PROSPER PAFF  Procedure(s) Performed: ROBOTIC ASSISTED NAVIGATIONAL BRONCHOSCOPY (Right) BRONCHIAL BIOPSIES FIDUCIAL MARKER PLACEMENT     Patient location during evaluation: PACU Anesthesia Type: General Level of consciousness: awake and alert, patient cooperative and oriented Pain management: pain level controlled Vital Signs Assessment: post-procedure vital signs reviewed and stable Respiratory status: spontaneous breathing, nonlabored ventilation and respiratory function stable Cardiovascular status: blood pressure returned to baseline and stable Postop Assessment: no apparent nausea or vomiting, able to ambulate and adequate PO intake Anesthetic complications: yes   Encounter Notable Events  Notable Event Outcome Phase Comment  Difficult to intubate - expected  Intraprocedure Filed from anesthesia note documentation.    Last Vitals:  Vitals:   09/24/21 1251 09/24/21 1252  BP:    Pulse: 65 68  Resp: 14 12  Temp:    SpO2: 98% 98%    Last Pain:  Vitals:   09/24/21 1245  TempSrc:   PainSc: 0-No pain                 Rhena Glace,E. Beatric Fulop

## 2021-09-26 LAB — CYTOLOGY - NON PAP

## 2021-09-26 NOTE — Progress Notes (Signed)
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 He has an appt with Dr. Julien Nordmann already scheduled.  Thanks,  BLI  Garner Nash, DO [facility] 09/26/2021 2:37 PM

## 2021-10-03 ENCOUNTER — Encounter: Payer: Self-pay | Admitting: Adult Health

## 2021-10-03 ENCOUNTER — Ambulatory Visit (INDEPENDENT_AMBULATORY_CARE_PROVIDER_SITE_OTHER): Payer: Medicare Other | Admitting: Adult Health

## 2021-10-03 DIAGNOSIS — F17200 Nicotine dependence, unspecified, uncomplicated: Secondary | ICD-10-CM

## 2021-10-03 DIAGNOSIS — C3431 Malignant neoplasm of lower lobe, right bronchus or lung: Secondary | ICD-10-CM | POA: Diagnosis not present

## 2021-10-03 DIAGNOSIS — C3490 Malignant neoplasm of unspecified part of unspecified bronchus or lung: Secondary | ICD-10-CM | POA: Insufficient documentation

## 2021-10-03 DIAGNOSIS — J449 Chronic obstructive pulmonary disease, unspecified: Secondary | ICD-10-CM | POA: Diagnosis not present

## 2021-10-03 NOTE — Assessment & Plan Note (Signed)
History of left-sided lung cancer diagnosed in 2014 status post chemoradiation CT chest 2023 with enlarging right lower lobe nodule, hypermetabolic on PET scan.  Status post bronchoscopy on September 24, 2021 with positive cytology for squamous cell carcinoma. Discussed results in detail with patient and wife.  Patient has a follow-up appointment with Dr. Earlie Server next week to discuss treatment options  Plan  Patient Instructions  Work on quitting smoking.  Follow up with Dr. Julien Nordmann next week as planned and As needed   Follow up with Dr. Valeta Harms As needed

## 2021-10-03 NOTE — Assessment & Plan Note (Signed)
Smoking cessation discussed in detail 

## 2021-10-03 NOTE — Assessment & Plan Note (Signed)
History of COPD with emphysema.  Has no significant symptoms.  Previous PFT showed moderate airflow obstruction in 2018.  Patient is encouraged on smoking cessation.   If develops symptoms can add an maintenance regimen.

## 2021-10-03 NOTE — Patient Instructions (Signed)
Work on quitting smoking.  Follow up with Dr. Julien Nordmann next week as planned and As needed   Follow up with Dr. Valeta Harms As needed

## 2021-10-03 NOTE — Progress Notes (Signed)
@Patient  ID: [name], male    DOB: Feb 18, 1950, 71 y.o.   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  Status post left radical nephrectomy March 2015   metastatic renal cell carcinoma and a sub- xiphoid subcutaneous nodule status post resection August 13, 2021  TEST/EVENTS :   10/03/2021 Follow up : Hypermetabolic lung nodule Patient returns for a 1 month follow-up.  Patient was seen last visit for a pulmonary consult.  Patient has a history of non-small cell lung cancer diagnosed in 2014 status post concurrent chemoradiation.  Patient recently had a hernia repair with excision of a subxiphoid subcutaneous nodule with final pathology consistent with metastatic renal cell carcinoma. Recent CT chest showed enlarging   1.7 x 1.4 cm subsolid pulmonary nodule in the right lower lobe noted to be hypermetabolic on PET scan.  Patient underwent navigational bronchoscopy on September 24, 2021.  Cytology for the right lower lobe biopsies was positive for malignant cells consistent with squamous cell carcinoma.  He has been referred back to oncology and has an appointment with them next week to discuss treatment plan options He denies any hemoptysis, chest pain, orthopnea.  Says overall breathing is doing okay.   Still smoking , has cut back to 2 cigs /day.  We discussed smoking cessation.  Has Moderate COPD with emphysema. Is not on any inhalers.  Denies any cough or shortness of  breath   Allergies  Allergen Reactions   Ativan [Lorazepam] Other (See Comments)    Wife states " he became severally confused and combative"     Immunization History  Administered Date(s) Administered   Pneumococcal-Unspecified 09/20/2014    Past Medical History:  Diagnosis Date   Adrenal cancer, left ([place])    Arthritis    Cancer of kidney ([place])    left   Complication of anesthesia    Very little movement of neck- "self fusioning"   COPD (chronic obstructive pulmonary disease) ([facility])    Crohn's disease ([place])    no issues since 2012   Difficulty sleeping    occasionally   Dyspnea    with a little exertion,  "smoked cigarettes all my life"   History of blood transfusion    "chron's flare"   Lung cancer ([place])    Renal cell carcinoma of left kidney ([place]) 02/20/2015   Renal mass    Renal mass, left    Sleep apnea 05/2016   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     nicotine polacrilex (NICORETTE) 4 MG gum Take 4 mg by mouth as needed for smoking cessation.     Oxycodone HCl 10 MG TABS Take 1 tablet (10 mg total) by mouth 5 (five) times daily. 15 tablet 0   Probiotic Product (PROBIOTIC DAILY PO) Take 1 capsule by mouth daily. 70 Billion cfu/ 10 strains     TURMERIC CURCUMIN PO Take 1 capsule by mouth in the morning.     Vitamin D-Vitamin K (VITAMIN K2-VITAMIN D3 PO) Take 1 tablet by mouth in the morning. 125 mcg/ 180 mcg     Zinc  Sulfate (ZINC 15 PO) Take 1 tablet by mouth daily. Zinc with Selenium 200 mcg     No facility-administered medications prior to visit.     Review of Systems:   Constitutional:   No  weight loss, night sweats,  Fevers, chills,  +fatigue, or  lassitude.  HEENT:   No headaches,  Difficulty swallowing,  Tooth/dental problems, or  Sore throat,                No sneezing, itching, ear ache, nasal congestion, post nasal drip,   CV:  No chest pain,  Orthopnea, PND, swelling in lower extremities, anasarca, dizziness, palpitations, syncope.   GI  No heartburn, indigestion, abdominal pain, nausea, vomiting, diarrhea, change in bowel habits, loss of appetite, bloody stools.   Resp: No shortness of breath with exertion or at rest.  No excess mucus, no productive cough,  No non-productive cough,  No coughing up of blood.  No change in color of mucus.  No wheezing.  No chest wall deformity  Skin: no rash or lesions.  GU: no dysuria, change in color of urine, no urgency or frequency.  No flank pain, no hematuria   MS:  No joint pain or swelling.  No decreased range of motion.  No back pain.    Physical Exam  BP 122/70 (BP Location: Left Arm, Patient Position: Sitting, Cuff Size: Normal)   Pulse 82   Temp 98 F (36.7 C) (Oral)   Ht 5' 8"  (1.727 m)   Wt 171 lb 3.2 oz (77.7 kg)   SpO2 98%   BMI 26.03 kg/m   GEN: A/Ox3; pleasant , NAD, well nourished    HEENT:  [place]/AT,   NOSE-clear, THROAT-clear, no lesions, no postnasal drip or exudate noted.   NECK:  Supple w/ fair ROM; no JVD; normal carotid impulses w/o bruits; no thyromegaly or nodules palpated; no lymphadenopathy.    RESP  Clear  P & A; w/o, wheezes/ rales/ or rhonchi. no accessory muscle use, no dullness to percussion  CARD:  RRR, no m/r/g, no peripheral edema, pulses intact, no cyanosis or clubbing.  GI:   Soft & nt; nml bowel sounds; no organomegaly or masses detected.   Musco: Warm bil, no deformities or joint swelling noted.    Neuro: alert, no focal deficits noted.    Skin: Warm, no lesions or rashes    Lab Results:  CBC     BNP No results found for: "BNP"  ProBNP No results found for: "PROBNP"  Imaging: DG Chest Port 1 View  Result Date: 09/24/2021 CLINICAL DATA:  Postop. EXAM: PORTABLE CHEST 1 VIEW COMPARISON:  August 18, 2016. FINDINGS: The heart size and mediastinal contours are within normal limits. Right lung is clear. Stable postsurgical changes seen in left hilar and left upper lobe regions. The visualized skeletal structures  are unremarkable. IMPRESSION: Stable postsurgical post treatment changes are noted in the left hilar and upper lobe regions. No definite acute abnormality is noted. Electronically Signed   By: Marijo Conception M.D.   On: 09/24/2021 12:48   DG C-ARM BRONCHOSCOPY  Result Date: 09/24/2021 C-ARM BRONCHOSCOPY: Fluoroscopy was utilized by the requesting physician.  No radiographic interpretation.   DG C-Arm 1-60 Min-No Report  Result Date: 09/24/2021 Fluoroscopy was utilized by the requesting physician.  No radiographic interpretation.   CT Super D Chest Wo Contrast  Result Date: 09/24/2021 CLINICAL DATA:  History of left lung cancer, testicular cancer, left nephrectomy for renal cell carcinoma. Left adrenalectomy for metastatic renal cell carcinoma 02/17/2020. Enlarging hypermetabolic right lower lobe pulmonary nodule. * Tracking Code: BO * EXAM: CT CHEST WITHOUT CONTRAST TECHNIQUE: Multidetector CT imaging of the chest was performed using thin slice collimation for electromagnetic bronchoscopy planning purposes, without intravenous contrast. RADIATION DOSE REDUCTION: This exam was performed according to the departmental dose-optimization program which includes automated exposure control, adjustment of the mA and/or kV according to patient size and/or use of iterative reconstruction technique. COMPARISON:  06/26/2021 PET-CT and CT chest, abdomen and pelvis. FINDINGS:  Cardiovascular: Normal heart size. Trace pericardial effusion is similar. Left anterior descending coronary atherosclerosis. Atherosclerotic nonaneurysmal thoracic aorta. Normal caliber pulmonary arteries. Mediastinum/Nodes: No significant thyroid nodules. Unremarkable esophagus. No axillary adenopathy. Coarsely calcified subcarinal and right hilar nodes are unchanged and compatible with prior granulomatous disease. No pathologically enlarged mediastinal or discrete hilar nodes on these noncontrast images. Lungs/Pleura: No pneumothorax. No pleural effusion. Mixed cystic and solid 2.1 x 1.8 cm posterior right lower lobe pulmonary nodule (series 5/image 73), previously 2.0 x 1.7 cm on 06/26/2021 using similar measurement technique, slightly increased. Sharply marginated left upper perihilar and left upper lobe geographic consolidation with associated volume loss and distortion, compatible with radiation fibrosis. No acute consolidative airspace disease or new significant pulmonary nodules. Upper abdomen: Status post left adrenalectomy and left nephrectomy with no acute abnormality in the visualized upper abdomen. Musculoskeletal: No aggressive appearing focal osseous lesions. Mild thoracic spondylosis. IMPRESSION: 1. Continued slight growth of mixed cystic and solid 2.1 cm posterior right lower lobe pulmonary nodule, suspicious for malignancy, favoring metachronous primary bronchogenic carcinoma. 2. 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 3. Otherwise no evidence of metastatic disease in the chest. 4. One vessel coronary atherosclerosis. 5. Stable trace pericardial effusion. 6. Aortic Atherosclerosis (ICD10-I70.0). Electronically Signed   By: Ilona Sorrel M.D.   On: 09/24/2021 09:12         Latest Ref Rng & Units 09/17/2016    2:42 PM  PFT Results  FVC-Pre L 2.83   FVC-Predicted Pre % 70   FVC-Post L 3.10   FVC-Predicted Post % 77   Pre FEV1/FVC % % 60   Post  FEV1/FCV % % 63   FEV1-Pre L 1.69   FEV1-Predicted Pre % 57   FEV1-Post L 1.96   DLCO uncorrected ml/min/mmHg 18.53   DLCO UNC% % 65   DLCO corrected ml/min/mmHg 20.18   DLCO COR %Predicted % 71   DLVA Predicted % 90   TLC L 6.12   TLC % Predicted % 95   RV % Predicted % 138     No results found for: "NITRICOXIDE"      Assessment & Plan:   Lung cancer ([place]) History of left-sided lung cancer diagnosed in 2014 status post chemoradiation CT chest 2023 with enlarging right lower lobe  nodule, hypermetabolic on PET scan.  Status post bronchoscopy on September 24, 2021 with positive cytology for squamous cell carcinoma. Discussed results in detail with patient and wife.  Patient has a follow-up appointment with Dr. Earlie Server next week to discuss treatment options  Plan  Patient Instructions  Work on quitting smoking.  Follow up with Dr. Julien Nordmann next week as planned and As needed   Follow up with Dr. 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  Has no significant symptoms.  Previous PFT showed moderate airflow obstruction in 2018.  Patient is encouraged on smoking cessation.   If develops symptoms can add an maintenance regimen.  Tobacco use disorder Smoking cessation discussed in detail     Rexene Edison, NP 10/03/2021

## 2021-10-04 ENCOUNTER — Other Ambulatory Visit: Payer: Medicare Other

## 2021-10-04 ENCOUNTER — Ambulatory Visit (HOSPITAL_COMMUNITY)
Admission: RE | Admit: 2021-10-04 | Discharge: 2021-10-04 | Disposition: A | Discharge status: Home / Self Care | Payer: Medicare Other | Source: Ambulatory Visit | Attending: Internal Medicine | Admitting: Internal Medicine

## 2021-10-04 ENCOUNTER — Inpatient Hospital Stay: Payer: Medicare Other

## 2021-10-04 ENCOUNTER — Inpatient Hospital Stay: Payer: Medicare Other | Attending: Internal Medicine

## 2021-10-04 ENCOUNTER — Other Ambulatory Visit: Payer: Self-pay

## 2021-10-04 DIAGNOSIS — C3490 Malignant neoplasm of unspecified part of unspecified bronchus or lung: Secondary | ICD-10-CM | POA: Insufficient documentation

## 2021-10-04 DIAGNOSIS — Z85118 Personal history of other malignant neoplasm of bronchus and lung: Secondary | ICD-10-CM | POA: Insufficient documentation

## 2021-10-04 LAB — CBC WITH DIFFERENTIAL (CANCER CENTER ONLY)
Abs Immature Granulocytes: 0.04 10*3/uL (ref 0.00–0.07)
Basophils Absolute: 0.1 10*3/uL (ref 0.0–0.1)
Basophils Relative: 1 %
Eosinophils Absolute: 0.2 10*3/uL (ref 0.0–0.5)
Eosinophils Relative: 2 %
HCT: 43.8 % (ref 39.0–52.0)
Hemoglobin: 14.4 g/dL (ref 13.0–17.0)
Immature Granulocytes: 1 %
Lymphocytes Relative: 19 %
Lymphs Abs: 1.6 10*3/uL (ref 0.7–4.0)
MCH: 29.3 pg (ref 26.0–34.0)
MCHC: 32.9 g/dL (ref 30.0–36.0)
MCV: 89.2 fL (ref 80.0–100.0)
Monocytes Absolute: 0.6 10*3/uL (ref 0.1–1.0)
Monocytes Relative: 7 %
Neutro Abs: 5.9 10*3/uL (ref 1.7–7.7)
Neutrophils Relative %: 70 %
Platelet Count: 269 10*3/uL (ref 150–400)
RBC: 4.91 MIL/uL (ref 4.22–5.81)
RDW: 13.8 % (ref 11.5–15.5)
WBC Count: 8.3 10*3/uL (ref 4.0–10.5)
nRBC: 0 % (ref 0.0–0.2)

## 2021-10-04 LAB — CMP (CANCER CENTER ONLY)
ALT: 16 U/L (ref 0–44)
AST: 16 U/L (ref 15–41)
Albumin: 3.9 g/dL (ref 3.5–5.0)
Alkaline Phosphatase: 117 U/L (ref 38–126)
Anion gap: 3 — ABNORMAL LOW (ref 5–15)
BUN: 15 mg/dL (ref 8–23)
CO2: 32 mmol/L (ref 22–32)
Calcium: 9.7 mg/dL (ref 8.9–10.3)
Chloride: 102 mmol/L (ref 98–111)
Creatinine: 1.3 mg/dL — ABNORMAL HIGH (ref 0.61–1.24)
GFR, Estimated: 58 mL/min — ABNORMAL LOW (ref >60)
Glucose, Bld: 104 mg/dL — ABNORMAL HIGH (ref 70–99)
Potassium: 5.4 mmol/L — ABNORMAL HIGH (ref 3.5–5.1)
Sodium: 137 mmol/L (ref 135–145)
Total Bilirubin: 0.5 mg/dL (ref 0.3–1.2)
Total Protein: 7.1 g/dL (ref 6.5–8.1)

## 2021-10-04 MED ORDER — IOHEXOL 9 MG/ML PO SOLN
ORAL | Status: AC
  Filled 2021-10-04: qty 1000

## 2021-10-04 MED ORDER — IOHEXOL 300 MG/ML  SOLN
100.0000 mL | Freq: Once | INTRAMUSCULAR | Status: AC | PRN
  Administered 2021-10-04: 100 mL via INTRAVENOUS

## 2021-10-07 ENCOUNTER — Telehealth: Payer: Self-pay | Admitting: Radiation Oncology

## 2021-10-07 ENCOUNTER — Inpatient Hospital Stay: Payer: Medicare Other | Attending: Internal Medicine | Admitting: Internal Medicine

## 2021-10-07 VITALS — BP 116/80 | HR 89 | Temp 98.1°F | Resp 17 | Wt 170.2 lb

## 2021-10-07 DIAGNOSIS — Z9221 Personal history of antineoplastic chemotherapy: Secondary | ICD-10-CM | POA: Diagnosis not present

## 2021-10-07 DIAGNOSIS — Z923 Personal history of irradiation: Secondary | ICD-10-CM | POA: Insufficient documentation

## 2021-10-07 DIAGNOSIS — Z905 Acquired absence of kidney: Secondary | ICD-10-CM | POA: Diagnosis not present

## 2021-10-07 DIAGNOSIS — F17210 Nicotine dependence, cigarettes, uncomplicated: Secondary | ICD-10-CM | POA: Insufficient documentation

## 2021-10-07 DIAGNOSIS — Z85118 Personal history of other malignant neoplasm of bronchus and lung: Secondary | ICD-10-CM | POA: Diagnosis present

## 2021-10-07 DIAGNOSIS — Z85528 Personal history of other malignant neoplasm of kidney: Secondary | ICD-10-CM | POA: Diagnosis not present

## 2021-10-07 DIAGNOSIS — Z79899 Other long term (current) drug therapy: Secondary | ICD-10-CM | POA: Diagnosis not present

## 2021-10-07 DIAGNOSIS — C3490 Malignant neoplasm of unspecified part of unspecified bronchus or lung: Secondary | ICD-10-CM | POA: Diagnosis not present

## 2021-10-07 NOTE — Telephone Encounter (Signed)
10/2 @ 4:20 pm Left voicemail for patient to call our office to be schedule for consult.

## 2021-10-07 NOTE — Progress Notes (Signed)
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  The patient had metastatic renal cell carcinoma and a subxiphoid subcutaneous nodule status post resection on 08/13/2021. 4) stage Ia (T1c, N0, M0) non-small cell lung cancer, squamous cell carcinoma presented with right lower lobe lung nodule diagnosed in September 2023.  PRIOR THERAPY:  1) Concurrent chemoradiation with weekly carboplatin for AUC of 2 and paclitaxel 45 mg/M2, status post 7 cycles, last dose was given 01/31/2013. 2) Status post left laparoscopic radical nephrectomy under the care of Dr. Louis Meckel on 04/04/2013.  CURRENT THERAPY: The patient is expected to undergo curative SBRT to this nodule under the care of Dr. Tammi Klippel in the next few weeks.  INTERVAL HISTORY: [name] 71 y.o. male returns to the clinic today for follow-up visit accompanied by his wife.  The patient is feeling fine today with no concerning complaints.  He denied having any current chest pain but has shortness of breath increased with exertion.  He has no chest pain, cough or hemoptysis.  He has no nausea, vomiting, diarrhea or constipation.  He has no headache or visual changes.  He was seen recently by Dr. Valeta Harms and he underwent bronchoscopy with biopsy of the right lower lobe lung nodule and the final pathology was consistent with squamous cell carcinoma.  The patient also had repeat CT scan of the chest, abdomen and pelvis performed recently and he is here for evaluation and discussion of  his scan results and treatment options.   MEDICAL HISTORY: Past Medical History:  Diagnosis Date   Adrenal cancer, left ([facility])    Arthritis    Cancer of kidney ([place])    left   Complication of anesthesia    Very little movement of neck- "self fusioning"   COPD (chronic obstructive pulmonary disease) ([facility])    Crohn's disease ([facility])    no issues since 2012   Difficulty sleeping    occasionally   Dyspnea    with a little exertion,  "smoked cigarettes all my life"   History of blood transfusion    "chron's flare"   Lung cancer ([facility])    Renal cell carcinoma of left kidney ([facility]) 02/20/2015   Renal mass    Renal mass, left    Sleep apnea 05/2016   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  MEDICATIONS:  Current Outpatient Medications  Medication Sig Dispense Refill   ASTAXANTHIN PO Take 1 capsule by mouth in the morning.     nicotine polacrilex (NICORETTE) 4 MG gum Take 4 mg by mouth as needed for smoking cessation.     Oxycodone HCl 10 MG TABS Take 1 tablet (10 mg total) by mouth 5 (five) times daily. 15 tablet 0   Probiotic Product (PROBIOTIC DAILY PO) Take 1 capsule by mouth daily. 70 Billion cfu/ 10 strains     TURMERIC CURCUMIN PO Take 1 capsule by mouth in the morning.     Vitamin D-Vitamin K (VITAMIN K2-VITAMIN D3 PO) Take 1  tablet by mouth in the morning. 125 mcg/ 180 mcg     Zinc Sulfate (ZINC 15 PO) Take 1 tablet by mouth daily. Zinc with Selenium 200 mcg     No current facility-administered medications for this visit.    SURGICAL HISTORY:  Past Surgical History:  Procedure Laterality Date   APPENDECTOMY     BRONCHIAL BIOPSY  09/24/2021   Procedure: BRONCHIAL BIOPSIES;  Surgeon: Garner Nash, DO;  Location: [facility];  Service: Pulmonary;;   COLONOSCOPY     FIDUCIAL MARKER PLACEMENT  09/24/2021   Procedure: FIDUCIAL MARKER PLACEMENT;  Surgeon: Garner Nash, DO;  Location: [facility];   Service: Pulmonary;;   HERNIA REPAIR     x 2   LAPAROSCOPIC NEPHRECTOMY Left 04/04/2013   Procedure: LAPAROSCOPIC LEFT RADICAL NEPHRECTOMY ;  Surgeon: [name], MD;  Location: [facility];  Service: Urology;  Laterality: Left;   LUNG BIOPSY Left 08/08/2016   Procedure: LEFT LUNG BIOPSY;  Surgeon: Grace Isaac, MD;  Location: [facility];  Service: Thoracic;  Laterality: Left;   MASS EXCISION N/A 08/13/2021   Procedure: EXCISION OF SUBXIPHOID SUBCUTANEOUS  NODULE;  Surgeon: Johnathan Hausen, MD;  Location: [facility];  Service: General;  Laterality: N/A;   ROBOTIC ADRENALECTOMY Left 02/17/2020   Procedure: XI ROBOTIC LEFT ADRENALECTOMY;  Surgeon: [name], MD;  Location: [facility];  Service: Urology;  Laterality: Left;  REQUESTING 4HRS   testicular removal  2000   VENTRAL HERNIA REPAIR N/A 08/13/2021   Procedure: LAPAROSCOPIC VENTRAL HERNIA WITH MESH;  Surgeon: Johnathan Hausen, MD;  Location: [facility];  Service: General;  Laterality: N/A;   VIDEO BRONCHOSCOPY N/A 11/30/2012   Procedure: VIDEO BRONCHOSCOPY WITH FLUORO;  Surgeon: Elsie Stain, MD;  Location: [facility];  Service: Cardiopulmonary;  Laterality: N/A;   VIDEO BRONCHOSCOPY N/A 08/08/2016   Procedure: VIDEO BRONCHOSCOPY;  Surgeon: Grace Isaac, MD;  Location: [facility];  Service: Thoracic;  Laterality: N/A;    REVIEW OF SYSTEMS:  Constitutional: negative Eyes: negative Ears, nose, mouth, throat, and face: negative Respiratory: positive for dyspnea on exertion Cardiovascular: negative Gastrointestinal: negative Genitourinary:negative Integument/breast: negative Hematologic/lymphatic: negative Musculoskeletal:negative Neurological: negative Behavioral/Psych: negative Endocrine: negative Allergic/Immunologic: negative   PHYSICAL EXAMINATION: General appearance: alert, cooperative, fatigued, and no distress Head: Normocephalic, without obvious abnormality, atraumatic Neck: no adenopathy, no JVD, supple, symmetrical,  trachea midline, and thyroid not enlarged, symmetric, no tenderness/mass/nodules Lymph nodes: Cervical, supraclavicular, and axillary nodes normal. Resp: clear to auscultation bilaterally Back: symmetric, no curvature. ROM normal. No CVA tenderness. Cardio: regular rate and rhythm, S1, S2 normal, no murmur, click, rub or gallop GI: soft, non-tender; bowel sounds normal; no masses,  no organomegaly Extremities: extremities normal, atraumatic, no cyanosis or edema Neurologic: Alert and oriented X 3, normal strength and tone. Normal symmetric reflexes. Normal coordination and gait  ECOG PERFORMANCE STATUS: 1 - Symptomatic but completely ambulatory  Blood pressure 116/80, pulse 89, temperature 98.1 F (36.7 C), temperature source Oral, resp. rate 17, weight 170 lb 4 oz (77.2 kg), SpO2 96 %.  LABORATORY DATA: Lab Results  Component Value Date   WBC 8.3 10/04/2021   HGB 14.4 10/04/2021   HCT 43.8 10/04/2021   MCV 89.2 10/04/2021   PLT 269 10/04/2021      Chemistry      Component Value Date/Time   NA 137 10/04/2021 1414   NA 142 06/30/2016 1406   K 5.4 (H) 10/04/2021 1414   K 4.8 06/30/2016 1406   CL 102  10/04/2021 1414   CO2 32 10/04/2021 1414   CO2 29 06/30/2016 1406   BUN 15 10/04/2021 1414   BUN 14.8 06/30/2016 1406   CREATININE 1.30 (H) 10/04/2021 1414   CREATININE 1.4 (H) 06/30/2016 1406      Component Value Date/Time   CALCIUM 9.7 10/04/2021 1414   CALCIUM 9.8 06/30/2016 1406   ALKPHOS 117 10/04/2021 1414   ALKPHOS 122 06/30/2016 1406   AST 16 10/04/2021 1414   AST 11 06/30/2016 1406   ALT 16 10/04/2021 1414   ALT 13 06/30/2016 1406   BILITOT 0.5 10/04/2021 1414   BILITOT 0.40 06/30/2016 1406       RADIOGRAPHIC STUDIES: CT Chest W Contrast  Result Date: 10/07/2021 CLINICAL DATA:  Non-small-cell lung cancer. Squamous cell carcinoma. Left upper lobe primary. Left nephrectomy for renal cell carcinoma. Left testicular cancer. * Tracking Code: BO * EXAM: CT CHEST,  ABDOMEN, AND PELVIS WITH CONTRAST TECHNIQUE: Multidetector CT imaging of the chest, abdomen and pelvis was performed following the standard protocol during bolus administration of intravenous contrast. RADIATION DOSE REDUCTION: This exam was performed according to the departmental dose-optimization program which includes automated exposure control, adjustment of the mA and/or kV according to patient size and/or use of iterative reconstruction technique. CONTRAST:  100 cc of Omnipaque 300 COMPARISON:  09/24/2021 chest CT. 06/26/2021 chest abdomen and pelvic CTs. FINDINGS: CT CHEST FINDINGS Cardiovascular: Aortic atherosclerosis. Normal heart size, with minimal anterior pericardial thickening or fluid. Lad coronary artery calcification. No central pulmonary embolism, on this non-dedicated study. Left upper lobe pulmonary artery branches are surrounded by radiation induced consolidation. Mediastinum/Nodes: No supraclavicular adenopathy. Calcified mediastinal and right hilar nodes are likely related to old granulomatous disease. No mediastinal or hilar adenopathy. Lungs/Pleura: No pleural fluid.  Mild centrilobular emphysema. Mixed attenuation right lower lobe pulmonary nodule measures 2.2 x 1.5 cm on 79/4 and is similar to 2.1 x 1.8 cm on the prior exam (when remeasured). Adjacent biopsy or radiation clips. Central left upper lobe/suprahilar radiation induced consolidation is unchanged, without local recurrence. Musculoskeletal: No acute osseous abnormality. CT ABDOMEN PELVIS FINDINGS Hepatobiliary: Normal liver. Normal gallbladder, without biliary ductal dilatation. Pancreas: Normal, without mass or ductal dilatation. Spleen: Normal in size, without focal abnormality. Adrenals/Urinary Tract: Normal right adrenal gland. Left adrenalectomy and nephrectomy, without local recurrence. Too small to characterize lower pole right renal lesion of 11 mm measured 9 mm on the prior exam. Greater than fluid density and 9 mm  craniocaudal on coronal image 97 today versus 8 mm on the prior. No hydronephrosis. Normal urinary bladder. Stomach/Bowel: Normal stomach, without wall thickening. Normal colon and terminal ileum. Appendectomy. Repair of small bowel containing ventral abdominal wall hernias in 06/26/2021. Vascular/Lymphatic: Aortic atherosclerosis. No abdominopelvic adenopathy. Reproductive: Normal prostate. Other: No significant free fluid. No free intraperitoneal air. No evidence of omental or peritoneal disease. minimal anterior abdominal wall fluid is relatively ill-defined at 5.8 x 1.1 cm on 78/2. the enhancing nodule just caudal to the xiphoid process of the sternum has been resected or resolved with mild interstitial thickening remaining on 55/2. Musculoskeletal: Osteopenia. IMPRESSION: 1. Similar size of a mixed attenuation right lower lobe pulmonary nodule since the chest CT of 10 days ago. 2. No evidence of metastatic disease in the chest, abdomen, or pelvis. 3. Left nephrectomy and adrenalectomy. 4. Mild enlargement of a lower pole right renal lesion which measures greater than fluid density. Considerations include complex cyst or solid neoplasm. Consider further evaluation with dedicated pre and post contrast abdominal MRI  versus attention on follow-up CTs. 5. Interval ventral abdominal wall hernia repair with minimal nonspecific anterior abdominal wall fluid. 6. Aortic atherosclerosis (ICD10-I70.0), coronary artery atherosclerosis and emphysema (ICD10-J43.9). Electronically Signed   By: Abigail Miyamoto M.D.   On: 10/07/2021 14:54   CT Abdomen Pelvis W Contrast  Result Date: 10/07/2021 CLINICAL DATA:  Non-small-cell lung cancer. Squamous cell carcinoma. Left upper lobe primary. Left nephrectomy for renal cell carcinoma. Left testicular cancer. * Tracking Code: BO * EXAM: CT CHEST, ABDOMEN, AND PELVIS WITH CONTRAST TECHNIQUE: Multidetector CT imaging of the chest, abdomen and pelvis was performed following the standard  protocol during bolus administration of intravenous contrast. RADIATION DOSE REDUCTION: This exam was performed according to the departmental dose-optimization program which includes automated exposure control, adjustment of the mA and/or kV according to patient size and/or use of iterative reconstruction technique. CONTRAST:  100 cc of Omnipaque 300 COMPARISON:  09/24/2021 chest CT. 06/26/2021 chest abdomen and pelvic CTs. FINDINGS: CT CHEST FINDINGS Cardiovascular: Aortic atherosclerosis. Normal heart size, with minimal anterior pericardial thickening or fluid. Lad coronary artery calcification. No central pulmonary embolism, on this non-dedicated study. Left upper lobe pulmonary artery branches are surrounded by radiation induced consolidation. Mediastinum/Nodes: No supraclavicular adenopathy. Calcified mediastinal and right hilar nodes are likely related to old granulomatous disease. No mediastinal or hilar adenopathy. Lungs/Pleura: No pleural fluid.  Mild centrilobular emphysema. Mixed attenuation right lower lobe pulmonary nodule measures 2.2 x 1.5 cm on 79/4 and is similar to 2.1 x 1.8 cm on the prior exam (when remeasured). Adjacent biopsy or radiation clips. Central left upper lobe/suprahilar radiation induced consolidation is unchanged, without local recurrence. Musculoskeletal: No acute osseous abnormality. CT ABDOMEN PELVIS FINDINGS Hepatobiliary: Normal liver. Normal gallbladder, without biliary ductal dilatation. Pancreas: Normal, without mass or ductal dilatation. Spleen: Normal in size, without focal abnormality. Adrenals/Urinary Tract: Normal right adrenal gland. Left adrenalectomy and nephrectomy, without local recurrence. Too small to characterize lower pole right renal lesion of 11 mm measured 9 mm on the prior exam. Greater than fluid density and 9 mm craniocaudal on coronal image 97 today versus 8 mm on the prior. No hydronephrosis. Normal urinary bladder. Stomach/Bowel: Normal stomach, without  wall thickening. Normal colon and terminal ileum. Appendectomy. Repair of small bowel containing ventral abdominal wall hernias in 06/26/2021. Vascular/Lymphatic: Aortic atherosclerosis. No abdominopelvic adenopathy. Reproductive: Normal prostate. Other: No significant free fluid. No free intraperitoneal air. No evidence of omental or peritoneal disease. minimal anterior abdominal wall fluid is relatively ill-defined at 5.8 x 1.1 cm on 78/2. the enhancing nodule just caudal to the xiphoid process of the sternum has been resected or resolved with mild interstitial thickening remaining on 55/2. Musculoskeletal: Osteopenia. IMPRESSION: 1. Similar size of a mixed attenuation right lower lobe pulmonary nodule since the chest CT of 10 days ago. 2. No evidence of metastatic disease in the chest, abdomen, or pelvis. 3. Left nephrectomy and adrenalectomy. 4. Mild enlargement of a lower pole right renal lesion which measures greater than fluid density. Considerations include complex cyst or solid neoplasm. Consider further evaluation with dedicated pre and post contrast abdominal MRI versus attention on follow-up CTs. 5. Interval ventral abdominal wall hernia repair with minimal nonspecific anterior abdominal wall fluid. 6. Aortic atherosclerosis (ICD10-I70.0), coronary artery atherosclerosis and emphysema (ICD10-J43.9). Electronically Signed   By: Abigail Miyamoto M.D.   On: 10/07/2021 14:54   DG Chest Port 1 View  Result Date: 09/24/2021 CLINICAL DATA:  Postop. EXAM: PORTABLE CHEST 1 VIEW COMPARISON:  August 18, 2016. FINDINGS: The  heart size and mediastinal contours are within normal limits. Right lung is clear. Stable postsurgical changes seen in left hilar and left upper lobe regions. The visualized skeletal structures are unremarkable. IMPRESSION: Stable postsurgical post treatment changes are noted in the left hilar and upper lobe regions. No definite acute abnormality is noted. Electronically Signed   By: Marijo Conception M.D.   On: 09/24/2021 12:48   DG C-ARM BRONCHOSCOPY  Result Date: 09/24/2021 C-ARM BRONCHOSCOPY: Fluoroscopy was utilized by the requesting physician.  No radiographic interpretation.   DG C-Arm 1-60 Min-No Report  Result Date: 09/24/2021 Fluoroscopy was utilized by the requesting physician.  No radiographic interpretation.   CT Super D Chest Wo Contrast  Result Date: 09/24/2021 CLINICAL DATA:  History of left lung cancer, testicular cancer, left nephrectomy for renal cell carcinoma. Left adrenalectomy for metastatic renal cell carcinoma 02/17/2020. Enlarging hypermetabolic right lower lobe pulmonary nodule. * Tracking Code: BO * EXAM: CT CHEST WITHOUT CONTRAST TECHNIQUE: Multidetector CT imaging of the chest was performed using thin slice collimation for electromagnetic bronchoscopy planning purposes, without intravenous contrast. RADIATION DOSE REDUCTION: This exam was performed according to the departmental dose-optimization program which includes automated exposure control, adjustment of the mA and/or kV according to patient size and/or use of iterative reconstruction technique. COMPARISON:  06/26/2021 PET-CT and CT chest, abdomen and pelvis. FINDINGS: Cardiovascular: Normal heart size. Trace pericardial effusion is similar. Left anterior descending coronary atherosclerosis. Atherosclerotic nonaneurysmal thoracic aorta. Normal caliber pulmonary arteries. Mediastinum/Nodes: No significant thyroid nodules. Unremarkable esophagus. No axillary adenopathy. Coarsely calcified subcarinal and right hilar nodes are unchanged and compatible with prior granulomatous disease. No pathologically enlarged mediastinal or discrete hilar nodes on these noncontrast images. Lungs/Pleura: No pneumothorax. No pleural effusion. Mixed cystic and solid 2.1 x 1.8 cm posterior right lower lobe pulmonary nodule (series 5/image 73), previously 2.0 x 1.7 cm on 06/26/2021 using similar measurement technique, slightly increased.  Sharply marginated left upper perihilar and left upper lobe geographic consolidation with associated volume loss and distortion, compatible with radiation fibrosis. No acute consolidative airspace disease or new significant pulmonary nodules. Upper abdomen: Status post left adrenalectomy and left nephrectomy with no acute abnormality in the visualized upper abdomen. Musculoskeletal: No aggressive appearing focal osseous lesions. Mild thoracic spondylosis. IMPRESSION: 1. Continued slight growth of mixed cystic and solid 2.1 cm posterior right lower lobe pulmonary nodule, suspicious for malignancy, favoring metachronous primary bronchogenic carcinoma. 2. 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 3. Otherwise no evidence of metastatic disease in the chest. 4. One vessel coronary atherosclerosis. 5. Stable trace pericardial effusion. 6. Aortic Atherosclerosis (ICD10-I70.0). Electronically Signed   By: Ilona Sorrel M.D.   On: 09/24/2021 09:12     ASSESSMENT AND PLAN:  This is a very pleasant 71 years old white male with history of stage IIIa non-small cell lung cancer status post concurrent chemoradiation with weekly carboplatin and paclitaxel followed by observation since the patient was noted good candidate for consolidation chemotherapy at that time. He also has a history of renal cell carcinoma status post left radical nephrectomy. The patient has been in observation since January 2015. The patient has been on observation since that time and he is feeling fine with no concerning complaints. Repeat CT scan of the chest, abdomen pelvis performed recently showed enlarging left adrenal mass but the mass has low hypermetabolic activity on the previous PET scan in September 2020. The patient has been on observation since that time and he is  feeling fine with no concerning complaints. Repeat CT scan of the chest, abdomen pelvis as well as PET scan on June 26, 2021 showed  interval increase in the size of the 1.7 x 1.4 cm subsolid nodule in the right lower lobe noted to be hypermetabolic on the PET scan and suspicious for metachronous primary neoplasm.  Metastatic disease not entirely excluded.  The patient also has a supple sclerotic lesion in the left aspect of the T12 vertebral body with low-level hypermetabolism on the PET scan and a 1.6 cm enhancing subcutaneous lesion in the midline epigastric anterior abdominal wall increased in size from 0.7 cm on CT of the abdomen pelvis in May 2022. The patient recently underwent a hernia repair with excision of the subxiphoid subcutaneous nodule and the final pathology was consistent with metastatic renal cell carcinoma.  I had a lengthy discussion with the patient and his wife today about his condition and further investigation and treatment options. Based on the previous CT scan of the chest, abdomen and pelvis as well as the PET scan he has suspicious enlarging right lower lobe lung nodule that was hypermetabolic on the previous scan and this could be metachronous primary lung cancer versus metastatic renal cell carcinoma especially with the recent pathology from the subcutaneous nodule. The patient underwent bronchoscopy with biopsy of the right lower lobe cavitary lesion and it was consistent with a squamous cell carcinoma of the lung. I recommended for the patient to see Dr. Tammi Klippel for consideration of curative SBRT to this new lesion. Regarding the metastatic renal cell carcinoma, the patient has no other evidence of metastasis and we will continue to monitor it closely and consider The patient for treatment with immunotherapy if there is any other concerning metastatic lesions. For the smoking cessation I strongly encouraged the patient to quit smoking. I will see him back for follow-up visit in 4 months for evaluation and repeat CT scan of the chest, abdomen and pelvis for restaging of his disease. He was advised to call  immediately if he has any other concerning symptoms in the interval.  I strongly encouraged the patient to call immediately if he has any concerning chest pain or shortness of breath in the interval. Disclaimer: This note was dictated with voice recognition software. Similar sounding words can inadvertently be transcribed and may not be corrected upon review.

## 2021-10-21 NOTE — Progress Notes (Signed)
Thoracic Location of Tumor / Histology:  stage Ia (T1c, N0, M0) non-small cell lung cancer, squamous cell carcinoma presented with right lower lobe lung nodule diagnosed in September 2023.   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  The patient had metastatic renal cell carcinoma and a subxiphoid subcutaneous nodule status post resection on 08/13/2021.   10/04/2021 CT Chest, Abdomen,and Pelvis with Contrast CLINICAL DATA:  Non-small-cell lung cancer. Squamous cell carcinoma. Left upper lobe primary. Left nephrectomy for renal cell carcinoma. Left testicular cancer. * Tracking Code: BO *  FINDINGS: CT CHEST FINDINGS Cardiovascular: Aortic atherosclerosis. Normal heart size, with minimal anterior pericardial thickening or fluid. Lad coronary artery calcification. No central pulmonary embolism, on this non-dedicated study. Left upper lobe pulmonary artery branches are surrounded by radiation induced consolidation.   Mediastinum/Nodes: No supraclavicular adenopathy. Calcified mediastinal and right hilar nodes are likely related to old granulomatous disease. No mediastinal or hilar adenopathy.   Lungs/Pleura: No pleural fluid.  Mild centrilobular emphysema.   Mixed attenuation right lower lobe pulmonary nodule measures 2.2 x 1.5 cm on 79/4 and is similar to 2.1 x 1.8 cm on the prior exam (when remeasured). Adjacent biopsy or radiation clips.   Central left upper lobe/suprahilar radiation induced consolidation is unchanged, without local recurrence.   Musculoskeletal: No acute osseous abnormality.   CT ABDOMEN PELVIS FINDINGS   Hepatobiliary: Normal liver. Normal  gallbladder, without biliary ductal dilatation.   Pancreas: Normal, without mass or ductal dilatation.   Spleen: Normal in size, without focal abnormality.   Adrenals/Urinary Tract: Normal right adrenal gland. Left adrenalectomy and nephrectomy, without local recurrence. Too small to characterize lower pole right renal lesion of 11 mm measured 9 mm on the prior exam. Greater than fluid density and 9 mm craniocaudal on coronal image 97 today versus 8 mm on the prior. No hydronephrosis. Normal urinary bladder.   Stomach/Bowel: Normal stomach, without wall thickening. Normal colon and terminal ileum. Appendectomy. Repair of small bowel containing ventral abdominal wall hernias in 06/26/2021.   Vascular/Lymphatic: Aortic atherosclerosis. No abdominopelvic adenopathy.   Reproductive: Normal prostate.   Other: No significant free fluid. No free intraperitoneal air. No evidence of omental or peritoneal disease. minimal anterior abdominal wall fluid is relatively ill-defined at 5.8 x 1.1 cm on 78/2. the enhancing nodule just caudal to the xiphoid process of the sternum has been resected or resolved with mild interstitial thickening remaining on 55/2.   Musculoskeletal: Osteopenia.   IMPRESSION: 1. Similar size of a mixed attenuation right lower lobe pulmonary nodule since the chest CT of 10 days ago. 2. No evidence of metastatic disease in the chest, abdomen, or pelvis. 3. Left nephrectomy and adrenalectomy. 4. Mild enlargement of a lower pole right renal lesion which measures greater than fluid density. Considerations include complex cyst or solid neoplasm. Consider further evaluation with dedicated pre and post contrast abdominal MRI versus attention on follow-up CTs. 5. Interval ventral abdominal wall hernia repair with minimal nonspecific anterior abdominal wall fluid. 6. Aortic atherosclerosis (ICD10-I70.0), coronary artery atherosclerosis and emphysema  (ICD10-J43.9).  09/24/2021 Dr. Tonia Brooms CT Super D Chest without Contrast CLINICAL DATA:  History of left lung cancer, testicular cancer, left nephrectomy for renal cell carcinoma. Left adrenalectomy for metastatic  renal cell carcinoma 02/17/2020. Enlarging hypermetabolic right lower lobe pulmonary nodule.  *Tracking Code:  BO*  FINDINGS: Cardiovascular: Normal heart size. Trace pericardial effusion is similar. Left anterior descending coronary atherosclerosis. Atherosclerotic nonaneurysmal thoracic aorta. Normal caliber pulmonary arteries.   Mediastinum/Nodes: No significant thyroid nodules. Unremarkable esophagus. No axillary adenopathy. Coarsely calcified subcarinal and right hilar nodes are unchanged and compatible with prior granulomatous disease. No pathologically enlarged mediastinal or discrete hilar nodes on these noncontrast images.   Lungs/Pleura: No pneumothorax. No pleural effusion. Mixed cystic and solid 2.1 x 1.8 cm posterior right lower lobe pulmonary nodule (series 5/image 73), previously 2.0 x 1.7 cm on 06/26/2021 using similar measurement technique, slightly increased. Sharply marginated left upper perihilar and left upper lobe geographic consolidation with associated volume loss and distortion, compatible with radiation fibrosis. No acute consolidative airspace disease or new significant pulmonary nodules.   Upper abdomen: Status post left adrenalectomy and left nephrectomy with no acute abnormality in the visualized upper abdomen.   Musculoskeletal: No aggressive appearing focal osseous lesions. Mild thoracic spondylosis.   IMPRESSION: 1. Continued slight growth of mixed cystic and solid 2.1 cm posterior right lower lobe pulmonary nodule, suspicious for malignancy, favoring metachronous primary bronchogenic carcinoma. 2. 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 3. Otherwise no evidence of metastatic disease in the  chest. 4. One vessel coronary atherosclerosis. 5. Stable trace pericardial effusion. 6. Aortic Atherosclerosis (ICD10-I70.0).    06/26/2021 Cassandra Heilingoetter, PA-C CT Chest, Abdomen, Pelvis, with Contrast CLINICAL DATA:  Status post left nephrectomy for renal cell cancer about 5 years ago. Left adrenal adenectomy and metastatic resection on 02/17/2020. Restaging. * Tracking Code: BO *   FINDINGS: CT CHEST FINDINGS Cardiovascular: The heart size is normal. No substantial pericardial effusion. Coronary artery calcification is evident. Mild atherosclerotic calcification is noted in the wall of the thoracic aorta. Ascending thoracic aorta measures 3.8 cm diameter.   Mediastinum/Nodes: No mediastinal lymphadenopathy. Calcified nodal tissue is seen in the subcarinal station. Soft tissue density in the AP window and left suprahilar region is similar to prior likely related to radiation scarring. The esophagus has normal imaging features. There is no axillary lymphadenopathy.   Lungs/Pleura: Volume loss with scarring in the left upper lobe is similar to prior. 1.7 x 1.4 cm sub solid nodule in the right lower lobe (image [id number]) has increased in size from 6 mm on the 2021 exam. This nodule is noted to be hypermetabolic on PET-CT performed today. No focal airspace consolidation. There is no evidence of pleural effusion.   Musculoskeletal: Subtle sclerotic lesion noted left aspect of the T12 vertebral body (see PET-CT report). No other suspicious lytic or sclerotic osseous abnormality. 16 mm enhancing subcutaneous lesion identified in the midline epigastric anterior abdominal wall, just caudal to the tip of the xiphoid process. This has increased in size from 10 mm on an abdomen pelvis CT of 05/29/2020.   CT ABDOMEN PELVIS FINDINGS Hepatobiliary: No suspicious focal abnormality within the liver parenchyma. There is no evidence for gallstones, gallbladder wall thickening, or  pericholecystic fluid. No intrahepatic or extrahepatic biliary dilation.   Pancreas: No focal mass lesion. No dilatation of the main duct. No intraparenchymal cyst. No peripancreatic edema.   Spleen: No splenomegaly. No focal mass lesion.   Adrenals/Urinary Tract: Right adrenal gland and kidney unremarkable. Left adrenal gland and left kidney surgically absent. Right ureter unremarkable. The urinary bladder appears normal for the degree of distention.   Stomach/Bowel: Stomach is unremarkable. No  gastric wall thickening. No evidence of outlet obstruction. Duodenum is normally positioned as is the ligament of Treitz. No small bowel wall thickening. No small bowel dilatation. The terminal ileum is normal. The appendix is not well visualized, but there is no edema or inflammation in the region of the cecum. No gross colonic mass. No colonic wall thickening.   Vascular/Lymphatic: There is moderate atherosclerotic calcification of the abdominal aorta without aneurysm. There is no gastrohepatic or hepatoduodenal ligament lymphadenopathy. No retroperitoneal or mesenteric lymphadenopathy. No pelvic sidewall lymphadenopathy.   Reproductive: The prostate gland and seminal vesicles are unremarkable.   Other: No intraperitoneal free fluid.   Musculoskeletal: No worrisome lytic or sclerotic osseous abnormality.   IMPRESSION: 1. Interval increase in size of the 1.7 x 1.4 cm sub solid nodule in the right lower lobe, noted to be hypermetabolic on PET-CT performed today. Imaging features are highly suspicious for metachronous primary neoplasm. Metastatic disease not entirely excluded. 2. Subtle sclerotic lesion left aspect of the T12 vertebral body with low level hypermetabolism on PET imaging (see PET-CT report). 3. 16 mm enhancing subcutaneous lesion in the midline epigastric anterior abdominal wall, just caudal to the tip of the xiphoid process, increased in size from 7 mm on a abdomen  pelvis CT of 05/29/2020. This nodule shows low level hypermetabolism on PET imaging. This is indeterminate but given apparent enhancement and low level hypermetabolism, close correlation for metastatic lesion recommended. 4. Status post left nephrectomy and adrenalectomy. No evidence for local recurrence or metastatic disease in the abdomen or pelvis. 5. Midline ventral hernia contains small bowel, just cranial to the ventral mesh. No complicating features. 6. Aortic Atherosclerosis (ICD10-I70.0).   Tobacco/Marijuana/Snuff/ETOH use:  Nicorette gum (Smoking cessation), no drug, smokeless, or alcohol.  Past/Anticipated interventions by cardiothoracic surgery, if any:    09/24/2021 Dr. Tonia Brooms Robotic Assisted Navigational Bronchoscopy      Past/Anticipated interventions by medical oncology, if any:    10/07/2021 Dr. Arbutus Ped PRIOR THERAPY:  1) Concurrent chemoradiation with weekly carboplatin for AUC of 2 and paclitaxel 45 mg/M2, status post 7 cycles, last dose was given 01/31/2013. 2) Status post left laparoscopic radical nephrectomy under the care of Dr. Marlou Porch on 04/04/2013.   CURRENT THERAPY: The patient is expected to undergo curative SBRT to this nodule under the care of Dr. Kathrynn Running in the next few weeks.  Signs/Symptoms Weight changes, if any: No Respiratory complaints, if any:  Yes, "It's due to smoking" Hemoptysis, if any: No Pain issues, if any:  0/10  SAFETY ISSUES: Prior radiation?  Yes,  Pacemaker/ICD?  No Possible current pregnancy?  Male Is the patient on methotrexate? No  Current Complaints / other details:

## 2021-10-22 ENCOUNTER — Ambulatory Visit
Admission: RE | Admit: 2021-10-22 | Discharge: 2021-10-22 | Disposition: A | Discharge status: Home / Self Care | Payer: Medicare Other | Source: Ambulatory Visit | Attending: Radiation Oncology | Admitting: Radiation Oncology

## 2021-10-22 ENCOUNTER — Other Ambulatory Visit: Payer: Self-pay

## 2021-10-22 VITALS — BP 125/70 | HR 96 | Temp 97.6°F | Resp 20 | Ht 67.0 in | Wt 171.0 lb

## 2021-10-22 DIAGNOSIS — M47814 Spondylosis without myelopathy or radiculopathy, thoracic region: Secondary | ICD-10-CM | POA: Insufficient documentation

## 2021-10-22 DIAGNOSIS — Z803 Family history of malignant neoplasm of breast: Secondary | ICD-10-CM | POA: Insufficient documentation

## 2021-10-22 DIAGNOSIS — C3491 Malignant neoplasm of unspecified part of right bronchus or lung: Secondary | ICD-10-CM

## 2021-10-22 DIAGNOSIS — Z8547 Personal history of malignant neoplasm of testis: Secondary | ICD-10-CM | POA: Diagnosis not present

## 2021-10-22 DIAGNOSIS — C3431 Malignant neoplasm of lower lobe, right bronchus or lung: Secondary | ICD-10-CM | POA: Insufficient documentation

## 2021-10-22 DIAGNOSIS — Z85528 Personal history of other malignant neoplasm of kidney: Secondary | ICD-10-CM | POA: Diagnosis not present

## 2021-10-22 DIAGNOSIS — Z8043 Family history of malignant neoplasm of testis: Secondary | ICD-10-CM | POA: Diagnosis not present

## 2021-10-22 DIAGNOSIS — I3139 Other pericardial effusion (noninflammatory): Secondary | ICD-10-CM | POA: Insufficient documentation

## 2021-10-22 DIAGNOSIS — I700 Atherosclerosis of aorta: Secondary | ICD-10-CM | POA: Insufficient documentation

## 2021-10-22 DIAGNOSIS — J432 Centrilobular emphysema: Secondary | ICD-10-CM | POA: Insufficient documentation

## 2021-10-22 DIAGNOSIS — F17210 Nicotine dependence, cigarettes, uncomplicated: Secondary | ICD-10-CM | POA: Insufficient documentation

## 2021-10-22 DIAGNOSIS — K5090 Crohn's disease, unspecified, without complications: Secondary | ICD-10-CM | POA: Diagnosis not present

## 2021-10-22 NOTE — Progress Notes (Signed)
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    ICD-10-CM   1. 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 Non-small cell lung cancer, right (HCC)  C34.91       HISTORY OF PRESENT ILLNESS: [name] is a 71 y.o. male seen at the request of Dr. Julien Nordmann. 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 He was initially seen in our multidisciplinary lung cancer clinic on 12/09/12. He was treated with concurrent chemoradiation with 7 cycles of weekly carboplatin and paclitaxel and 6.5 weeks of IMRT to the LUL lung mass and mediastinal lymphadenopathy completed 02/07/13. He subsequently underwent radical left nephrectomy on 04/04/13 for his renal cell carcinoma. He did very well in observation for several years but was found to have metastatic clear cell carcinoma by left adrenal biopsy on 10/31/19.  This was treated with robotic left adrenalectomy and meniscectomy under the care of Dr. Lovena Neighbours on 02/17/20.   Aside from the procedures above for his renal cell carcinoma, he has been on surveillance since treatment completion in 2015. More recently, he was being worked up for hernia repair with Dr. Kaylyn Lim and was noted to have an epigastric subcutaneous nodule as well as a new pulmonary nodule in the  RLL.  This was further evaluated with a repeat CT C/A/P and surveillance PET scan on 06/26/21 showing  an enlarging, hypermetabolic 16 mm RLL sub-solid nodule, which has been increasing in size over previous two CT scans.  Additionally, there was low-level hypermetabolism in the 1.6 cm superficial subcutaneous epigastric nodule and a subtle focus of hypermetabolism in the left T12 vertebral body near the pedicle with a stable appearance of post-treatment changes in the left hilum and suprahilar left lung without evidence of hypermetabolic disease in the left adrenalectomy and nephrectomy beds.  Dr. Hassell Done excised the subxiphoid nodule at the time of recent ventral hernia repair on 08/13/2021 and final pathology returned consistent with metastatic renal cell carcinoma.  He underwent robotic assisted bronchoscopy with biopsy of the RLL nodule on 09/24/21 under the care of Dr. Valeta Harms.  Final pathology confirmed malignant cells consistent with squamous cell carcinoma.  On restaging CT C/A/P on 10/04/21 there was continued slight growth of the 2.1 cm RLL pulmonary nodule without evidence of metastatic disease. He has been kindly referred back to [place] today to discuss the potential for radiotherapy in the management of his new, early stage lung cancer.  Dr. Julien Nordmann plans to continue to monitor the metastatic renal cell carcinoma and if other lesions present in the future, they would consider immunotherapy at that time.  PREVIOUS RADIATION THERAPY: Yes  12/20/13 - 02/07/13:  The patient's primary left lung cancer and grossly evident mediastinal lymphadenopathy were treated to 66 gray in 33 fractions of 2 gray  2000: peri-aortic / 25.5 Gy over 21 days  PAST MEDICAL HISTORY:  Past Medical History:  Diagnosis  Date   Adrenal cancer, left ([place])    Arthritis    Cancer of kidney ([place])    left   Complication of anesthesia    Very little movement of neck- "self fusioning"   COPD (chronic obstructive pulmonary disease) (HCC)    Crohn's disease ([place])    no issues since 2012   Difficulty sleeping    occasionally   Dyspnea     with a little exertion,  "smoked cigarettes all my life"   History of blood transfusion    "chron's flare"   Lung cancer ([place])    Renal cell carcinoma of left kidney ([place]) 02/20/2015   Renal mass    Renal mass, left    Sleep apnea 05/2016   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   s/p resection, no recurrence      PAST SURGICAL HISTORY: Past Surgical History:  Procedure Laterality Date   APPENDECTOMY     BRONCHIAL BIOPSY  09/24/2021   Procedure: BRONCHIAL BIOPSIES;  Surgeon: Garner Nash, DO;  Location: [facility];  Service: Pulmonary;;   COLONOSCOPY     FIDUCIAL MARKER PLACEMENT  09/24/2021   Procedure: FIDUCIAL MARKER PLACEMENT;  Surgeon: Garner Nash, DO;  Location: [facility];  Service: Pulmonary;;   HERNIA REPAIR     x 2   LAPAROSCOPIC NEPHRECTOMY Left 04/04/2013   Procedure: LAPAROSCOPIC LEFT RADICAL NEPHRECTOMY ;  Surgeon: [name], MD;  Location: [facility];  Service: Urology;  Laterality: Left;   LUNG BIOPSY Left 08/08/2016   Procedure: LEFT LUNG BIOPSY;  Surgeon: Grace Isaac, MD;  Location: [facility];  Service: Thoracic;  Laterality: Left;   MASS EXCISION N/A 08/13/2021   Procedure: EXCISION OF SUBXIPHOID SUBCUTANEOUS  NODULE;  Surgeon: Johnathan Hausen, MD;  Location: [facility];  Service: General;  Laterality: N/A;   ROBOTIC ADRENALECTOMY Left 02/17/2020   Procedure: XI ROBOTIC LEFT ADRENALECTOMY;  Surgeon: [name], MD;  Location: [facility];  Service: Urology;  Laterality: Left;  REQUESTING 4HRS   testicular removal  2000   VENTRAL HERNIA REPAIR N/A 08/13/2021   Procedure: LAPAROSCOPIC VENTRAL HERNIA WITH MESH;  Surgeon: Johnathan Hausen, MD;  Location: [facility];  Service: General;  Laterality: N/A;   VIDEO BRONCHOSCOPY N/A 11/30/2012   Procedure: VIDEO BRONCHOSCOPY WITH FLUORO;  Surgeon: Elsie Stain, MD;  Location: [facility];  Service: Cardiopulmonary;  Laterality: N/A;   VIDEO BRONCHOSCOPY N/A 08/08/2016   Procedure: VIDEO BRONCHOSCOPY;  Surgeon: Grace Isaac, MD;  Location: [facility];  Service: Thoracic;  Laterality: N/A;    FAMILY HISTORY:  Family History  Problem Relation Age of Onset   Asthma Brother    Testicular cancer Brother    Cancer Brother    Breast cancer Sister     SOCIAL HISTORY:  Social History   Socioeconomic History   Marital status: Married    Spouse name: Not on file   Number of children: Not on file   Years of education: Not on file   Highest education level: Not on file  Occupational History   Occupation: Consultant  Tobacco Use   Smoking status: Every Day    Packs/day: 0.25    Years: 50.00    Total pack years: 12.50    Types: Cigarettes    Start date: 01/06/1966    Passive exposure: Never   Smokeless tobacco: Never   Tobacco comments:    Pt smokes 2-3 ciggs as of 10/03/21 LW  Vaping Use   Vaping Use: Never used  Substance and Sexual Activity   Alcohol use: Not Currently   Drug use: No   Sexual activity: Not on file  Other Topics Concern   Not on file  Social History Narrative   Not on file   Social Determinants of Health   Financial Resource Strain: Not on file  Food Insecurity: Not on file  Transportation Needs: Not on file  Physical Activity: Not on file  Stress: Not on file  Social Connections: Not on file  Intimate Partner Violence: Not on file    ALLERGIES: Ativan [lorazepam]  MEDICATIONS:  Current Outpatient Medications  Medication Sig Dispense Refill   ASTAXANTHIN PO Take 1 capsule by mouth in the morning.     nicotine polacrilex (NICORETTE) 4 MG gum Take 4 mg by mouth as needed for smoking cessation.     Oxycodone HCl 10 MG TABS Take 1 tablet (10 mg total) by mouth 5 (five) times daily. 15 tablet 0   Probiotic Product (PROBIOTIC DAILY PO) Take 1 capsule by mouth daily. 70 Billion cfu/ 10 strains     TURMERIC CURCUMIN PO Take 1 capsule by mouth in the morning.     Vitamin D-Vitamin K (VITAMIN K2-VITAMIN D3 PO) Take 1 tablet by mouth in the morning. 125 mcg/ 180 mcg     Zinc  Sulfate (ZINC 15 PO) Take 1 tablet by mouth daily. Zinc with Selenium 200 mcg     No current facility-administered medications for this encounter.    REVIEW OF SYSTEMS:  On review of systems, the patient reports that he is doing well overall. He denies any chest pain, fevers, chills, night sweats, unintended weight changes. He does report some respiratory complaints but attributes them to his history of smoking. He denies any bowel or bladder disturbances, and denies abdominal pain, nausea or vomiting. He denies any new musculoskeletal or joint aches or pains. A complete review of systems is obtained and is otherwise negative.    PHYSICAL EXAM:  Wt Readings from Last 3 Encounters:  10/22/21 171 lb (77.6 kg)  10/07/21 170 lb 4 oz (77.2 kg)  10/03/21 171 lb 3.2 oz (77.7 kg)   Temp Readings from Last 3 Encounters:  10/22/21 97.6 F (36.4 C)  10/07/21 98.1 F (36.7 C) (Oral)  10/03/21 98 F (36.7 C) (Oral)   BP Readings from Last 3 Encounters:  10/22/21 125/70  10/07/21 116/80  10/03/21 122/70   Pulse Readings from Last 3 Encounters:  10/22/21 96  10/07/21 89  10/03/21 82   Pain Assessment Pain Score: 0-No pain/10  In general this is a well appearing Caucasian male in no acute distress. He's alert and oriented x4 and appropriate throughout the examination. Cardiopulmonary assessment is negative for acute distress and he exhibits normal effort.     KPS = 100  100 - Normal; no complaints; no evidence of disease. 90   - Able to carry on normal activity; minor signs or symptoms of disease. 80   - Normal activity with effort; some signs or symptoms of disease. 81   - Cares for self; unable to carry on normal activity or to do active work. 60   - Requires occasional assistance, but is able to care for most of his personal needs. 50   - Requires considerable assistance and frequent medical care. 40   - Disabled; requires special care and assistance. 32   - Severely disabled;  hospital admission is indicated although death not imminent. 39   - Very sick; hospital admission necessary;  active supportive treatment necessary. 10   - Moribund; fatal processes progressing rapidly. 0     - Dead  Karnofsky DA, Abelmann [place], Craver LS and Burchenal [facility] [phone]) The use of the nitrogen mustards in the palliative treatment of carcinoma: with particular reference to bronchogenic carcinoma Cancer 1 634-56  LABORATORY DATA:  Lab Results  Component Value Date   WBC 8.3 10/04/2021   HGB 14.4 10/04/2021   HCT 43.8 10/04/2021   MCV 89.2 10/04/2021   PLT 269 10/04/2021   Lab Results  Component Value Date   NA 137 10/04/2021   K 5.4 (H) 10/04/2021   CL 102 10/04/2021   CO2 32 10/04/2021   Lab Results  Component Value Date   ALT 16 10/04/2021   AST 16 10/04/2021   ALKPHOS 117 10/04/2021   BILITOT 0.5 10/04/2021     RADIOGRAPHY: CT Chest W Contrast  Result Date: 10/07/2021 CLINICAL DATA:  Non-small-cell lung cancer. Squamous cell carcinoma. Left upper lobe primary. Left nephrectomy for renal cell carcinoma. Left testicular cancer. * Tracking Code: BO * EXAM: CT CHEST, ABDOMEN, AND PELVIS WITH CONTRAST TECHNIQUE: Multidetector CT imaging of the chest, abdomen and pelvis was performed following the standard protocol during bolus administration of intravenous contrast. RADIATION DOSE REDUCTION: This exam was performed according to the departmental dose-optimization program which includes automated exposure control, adjustment of the mA and/or kV according to patient size and/or use of iterative reconstruction technique. CONTRAST:  100 cc of Omnipaque 300 COMPARISON:  09/24/2021 chest CT. 06/26/2021 chest abdomen and pelvic CTs. FINDINGS: CT CHEST FINDINGS Cardiovascular: Aortic atherosclerosis. Normal heart size, with minimal anterior pericardial thickening or fluid. Lad coronary artery calcification. No central pulmonary embolism, on this non-dedicated study. Left upper lobe  pulmonary artery branches are surrounded by radiation induced consolidation. Mediastinum/Nodes: No supraclavicular adenopathy. Calcified mediastinal and right hilar nodes are likely related to old granulomatous disease. No mediastinal or hilar adenopathy. Lungs/Pleura: No pleural fluid.  Mild centrilobular emphysema. Mixed attenuation right lower lobe pulmonary nodule measures 2.2 x 1.5 cm on 79/4 and is similar to 2.1 x 1.8 cm on the prior exam (when remeasured). Adjacent biopsy or radiation clips. Central left upper lobe/suprahilar radiation induced consolidation is unchanged, without local recurrence. Musculoskeletal: No acute osseous abnormality. CT ABDOMEN PELVIS FINDINGS Hepatobiliary: Normal liver. Normal gallbladder, without biliary ductal dilatation. Pancreas: Normal, without mass or ductal dilatation. Spleen: Normal in size, without focal abnormality. Adrenals/Urinary Tract: Normal right adrenal gland. Left adrenalectomy and nephrectomy, without local recurrence. Too small to characterize lower pole right renal lesion of 11 mm measured 9 mm on the prior exam. Greater than fluid density and 9 mm craniocaudal on coronal image 97 today versus 8 mm on the prior. No hydronephrosis. Normal urinary bladder. Stomach/Bowel: Normal stomach, without wall thickening. Normal colon and terminal ileum. Appendectomy. Repair of small bowel containing ventral abdominal wall hernias in 06/26/2021. Vascular/Lymphatic: Aortic atherosclerosis. No abdominopelvic adenopathy. Reproductive: Normal prostate. Other: No significant free fluid. No free intraperitoneal air. No evidence of omental or peritoneal disease. minimal anterior abdominal wall fluid is relatively ill-defined at 5.8 x 1.1 cm on 78/2. the enhancing nodule just caudal to the xiphoid process of the sternum has been resected or resolved with mild interstitial thickening remaining on 55/2. Musculoskeletal: Osteopenia. IMPRESSION: 1. Similar size of a mixed attenuation  right lower lobe pulmonary nodule since the chest CT of 10 days ago. 2. No evidence of metastatic disease in the chest, abdomen, or pelvis. 3. Left nephrectomy and adrenalectomy. 4.  Mild enlargement of a lower pole right renal lesion which measures greater than fluid density. Considerations include complex cyst or solid neoplasm. Consider further evaluation with dedicated pre and post contrast abdominal MRI versus attention on follow-up CTs. 5. Interval ventral abdominal wall hernia repair with minimal nonspecific anterior abdominal wall fluid. 6. Aortic atherosclerosis (ICD10-I70.0), coronary artery atherosclerosis and emphysema (ICD10-J43.9). Electronically Signed   By: Abigail Miyamoto M.D.   On: 10/07/2021 14:54   CT Abdomen Pelvis W Contrast  Result Date: 10/07/2021 CLINICAL DATA:  Non-small-cell lung cancer. Squamous cell carcinoma. Left upper lobe primary. Left nephrectomy for renal cell carcinoma. Left testicular cancer. * Tracking Code: BO * EXAM: CT CHEST, ABDOMEN, AND PELVIS WITH CONTRAST TECHNIQUE: Multidetector CT imaging of the chest, abdomen and pelvis was performed following the standard protocol during bolus administration of intravenous contrast. RADIATION DOSE REDUCTION: This exam was performed according to the departmental dose-optimization program which includes automated exposure control, adjustment of the mA and/or kV according to patient size and/or use of iterative reconstruction technique. CONTRAST:  100 cc of Omnipaque 300 COMPARISON:  09/24/2021 chest CT. 06/26/2021 chest abdomen and pelvic CTs. FINDINGS: CT CHEST FINDINGS Cardiovascular: Aortic atherosclerosis. Normal heart size, with minimal anterior pericardial thickening or fluid. Lad coronary artery calcification. No central pulmonary embolism, on this non-dedicated study. Left upper lobe pulmonary artery branches are surrounded by radiation induced consolidation. Mediastinum/Nodes: No supraclavicular adenopathy. Calcified  mediastinal and right hilar nodes are likely related to old granulomatous disease. No mediastinal or hilar adenopathy. Lungs/Pleura: No pleural fluid.  Mild centrilobular emphysema. Mixed attenuation right lower lobe pulmonary nodule measures 2.2 x 1.5 cm on 79/4 and is similar to 2.1 x 1.8 cm on the prior exam (when remeasured). Adjacent biopsy or radiation clips. Central left upper lobe/suprahilar radiation induced consolidation is unchanged, without local recurrence. Musculoskeletal: No acute osseous abnormality. CT ABDOMEN PELVIS FINDINGS Hepatobiliary: Normal liver. Normal gallbladder, without biliary ductal dilatation. Pancreas: Normal, without mass or ductal dilatation. Spleen: Normal in size, without focal abnormality. Adrenals/Urinary Tract: Normal right adrenal gland. Left adrenalectomy and nephrectomy, without local recurrence. Too small to characterize lower pole right renal lesion of 11 mm measured 9 mm on the prior exam. Greater than fluid density and 9 mm craniocaudal on coronal image 97 today versus 8 mm on the prior. No hydronephrosis. Normal urinary bladder. Stomach/Bowel: Normal stomach, without wall thickening. Normal colon and terminal ileum. Appendectomy. Repair of small bowel containing ventral abdominal wall hernias in 06/26/2021. Vascular/Lymphatic: Aortic atherosclerosis. No abdominopelvic adenopathy. Reproductive: Normal prostate. Other: No significant free fluid. No free intraperitoneal air. No evidence of omental or peritoneal disease. minimal anterior abdominal wall fluid is relatively ill-defined at 5.8 x 1.1 cm on 78/2. the enhancing nodule just caudal to the xiphoid process of the sternum has been resected or resolved with mild interstitial thickening remaining on 55/2. Musculoskeletal: Osteopenia. IMPRESSION: 1. Similar size of a mixed attenuation right lower lobe pulmonary nodule since the chest CT of 10 days ago. 2. No evidence of metastatic disease in the chest, abdomen, or  pelvis. 3. Left nephrectomy and adrenalectomy. 4. Mild enlargement of a lower pole right renal lesion which measures greater than fluid density. Considerations include complex cyst or solid neoplasm. Consider further evaluation with dedicated pre and post contrast abdominal MRI versus attention on follow-up CTs. 5. Interval ventral abdominal wall hernia repair with minimal nonspecific anterior abdominal wall fluid. 6. Aortic atherosclerosis (ICD10-I70.0), coronary artery atherosclerosis and emphysema (ICD10-J43.9). Electronically Signed   By: Abigail Miyamoto  M.D.   On: 10/07/2021 14:54   DG Chest Port 1 View  Result Date: 09/24/2021 CLINICAL DATA:  Postop. EXAM: PORTABLE CHEST 1 VIEW COMPARISON:  August 18, 2016. FINDINGS: The heart size and mediastinal contours are within normal limits. Right lung is clear. Stable postsurgical changes seen in left hilar and left upper lobe regions. The visualized skeletal structures are unremarkable. IMPRESSION: Stable postsurgical post treatment changes are noted in the left hilar and upper lobe regions. No definite acute abnormality is noted. Electronically Signed   By: Marijo Conception M.D.   On: 09/24/2021 12:48   DG C-ARM BRONCHOSCOPY  Result Date: 09/24/2021 C-ARM BRONCHOSCOPY: Fluoroscopy was utilized by the requesting physician.  No radiographic interpretation.   DG C-Arm 1-60 Min-No Report  Result Date: 09/24/2021 Fluoroscopy was utilized by the requesting physician.  No radiographic interpretation.   CT Super D Chest Wo Contrast  Result Date: 09/24/2021 CLINICAL DATA:  History of left lung cancer, testicular cancer, left nephrectomy for renal cell carcinoma. Left adrenalectomy for metastatic renal cell carcinoma 02/17/2020. Enlarging hypermetabolic right lower lobe pulmonary nodule. * Tracking Code: BO * EXAM: CT CHEST WITHOUT CONTRAST TECHNIQUE: Multidetector CT imaging of the chest was performed using thin slice collimation for electromagnetic bronchoscopy  planning purposes, without intravenous contrast. RADIATION DOSE REDUCTION: This exam was performed according to the departmental dose-optimization program which includes automated exposure control, adjustment of the mA and/or kV according to patient size and/or use of iterative reconstruction technique. COMPARISON:  06/26/2021 PET-CT and CT chest, abdomen and pelvis. FINDINGS: Cardiovascular: Normal heart size. Trace pericardial effusion is similar. Left anterior descending coronary atherosclerosis. Atherosclerotic nonaneurysmal thoracic aorta. Normal caliber pulmonary arteries. Mediastinum/Nodes: No significant thyroid nodules. Unremarkable esophagus. No axillary adenopathy. Coarsely calcified subcarinal and right hilar nodes are unchanged and compatible with prior granulomatous disease. No pathologically enlarged mediastinal or discrete hilar nodes on these noncontrast images. Lungs/Pleura: No pneumothorax. No pleural effusion. Mixed cystic and solid 2.1 x 1.8 cm posterior right lower lobe pulmonary nodule (series 5/image 73), previously 2.0 x 1.7 cm on 06/26/2021 using similar measurement technique, slightly increased. Sharply marginated left upper perihilar and left upper lobe geographic consolidation with associated volume loss and distortion, compatible with radiation fibrosis. No acute consolidative airspace disease or new significant pulmonary nodules. Upper abdomen: Status post left adrenalectomy and left nephrectomy with no acute abnormality in the visualized upper abdomen. Musculoskeletal: No aggressive appearing focal osseous lesions. Mild thoracic spondylosis. IMPRESSION: 1. Continued slight growth of mixed cystic and solid 2.1 cm posterior right lower lobe pulmonary nodule, suspicious for malignancy, favoring metachronous primary bronchogenic carcinoma. 2. 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 3. Otherwise no evidence of metastatic disease in the chest. 4. One  vessel coronary atherosclerosis. 5. Stable trace pericardial effusion. 6. Aortic Atherosclerosis (ICD10-I70.0). Electronically Signed   By: Ilona Sorrel M.D.   On: 09/24/2021 09:12      IMPRESSION/PLAN: 1. 71 y.o. 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  Today, we talked to the patient and family about the findings and workup thus far. We discussed the natural history of non-small cell lung cancer and general treatment, highlighting the role of radiotherapy in the management. We discussed the available radiation techniques, and focused on the details and logistics of delivery.  The recommendation is for a 3 fraction course  of stereotactic body radiotherapy (SBRT) to the RLL nodule. We reviewed the anticipated acute and late sequelae associated with radiation in this setting. The patient was encouraged to ask questions that were answered to his stated satisfaction.  At the end of our conversation, the patient is in agreement to proceed with the recommended 3 fraction course of stereotactic body radiotherapy (SBRT) to the RLL nodule.  He has freely signed written consent to proceed today in the office and a copy of this document will be placed in his medical record.  He appears to have a good understanding of his disease and our treatment recommendations and is in agreement with the stated plan so we will share this information with Dr. Earlie Server.  He is tentatively scheduled for CT simulation at 3 PM on Thursday, 10/24/2021 in anticipation of beginning his treatments in the near future.  We enjoyed meeting with him again today and look forward to continuing to participate in his care.  We personally spent 60 minutes in this encounter including chart review, reviewing radiological studies, meeting face-to-face with the patient, entering orders and completing  documentation.    Nicholos Johns, PA-C    Tyler Pita, MD  [facility] Direct Dial: [phone]  Fax: [phone] [email or web address]  Skype  [email or web address]   This document serves as a record of services personally performed by Tyler Pita, MD and Freeman Caldron, PA-C. It was created on their behalf by Wilburn Mylar, a trained medical scribe. The creation of this record is based on the scribe's personal observations and the provider's statements to them. This document has been checked and approved by the attending provider.

## 2021-10-28 ENCOUNTER — Ambulatory Visit
Admission: RE | Admit: 2021-10-28 | Discharge: 2021-10-28 | Disposition: A | Discharge status: Home / Self Care | Payer: Medicare Other | Source: Ambulatory Visit | Attending: Radiation Oncology | Admitting: Radiation Oncology

## 2021-10-28 ENCOUNTER — Other Ambulatory Visit: Payer: Self-pay

## 2021-10-28 DIAGNOSIS — C3491 Malignant neoplasm of unspecified part of right bronchus or lung: Secondary | ICD-10-CM

## 2021-10-28 DIAGNOSIS — C3431 Malignant neoplasm of lower lobe, right bronchus or lung: Secondary | ICD-10-CM | POA: Insufficient documentation

## 2021-10-28 NOTE — Progress Notes (Signed)
  Radiation Oncology         [phone] ________________________________  Name: [name] MRN: [id number]  Date: 10/28/2021  DOB: 08/28/1950  STEREOTACTIC BODY RADIOTHERAPY SIMULATION AND TREATMENT PLANNING NOTE    ICD-10-CM   1. 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  Identity was confirmed.  All relevant records and images related to the planned course of therapy were reviewed.  The patient freely provided informed written consent to proceed with treatment after reviewing the details related to the planned course of therapy. The consent form was witnessed and verified by the simulation staff.  Then, the patient was set-up in a stable reproducible  supine position for radiation therapy.  A BodyFix immobilization pillow was fabricated for reproducible positioning.  Then I personally applied the abdominal compression paddle to limit respiratory excursion.  4D respiratoy motion management CT images were obtained.  Surface markings were placed.  The CT images were loaded into the planning software.  Then, using Cine, MIP, and standard views, the internal target volume (ITV) and planning target volumes (PTV) were delinieated, and avoidance structures were contoured.  Treatment planning then occurred.  The radiation prescription was entered and confirmed.  A total of two complex treatment devices were fabricated in the form of the BodyFix immobilization pillow and a neck accuform cushion.  I have requested : 3D Simulation  I have requested a DVH of the following structures: Heart, Lungs, Esophagus, Chest Wall, Brachial Plexus, Major Blood Vessels, and targets.  SPECIAL TREATMENT PROCEDURE:  The planned course of therapy using radiation constitutes a special treatment procedure. Special care is required in the management of this  patient for the following reasons. This treatment constitutes a Special Treatment Procedure for the following reason: [ High dose per fraction requiring special monitoring for increased toxicities of treatment including daily imaging..  The special nature of the planned course of radiotherapy will require increased physician supervision and oversight to ensure patient's safety with optimal treatment outcomes.  This requires extended time and effort.    RESPIRATORY MOTION MANAGEMENT SIMULATION:  In order to account for effect of respiratory motion on target structures and other organs in the planning and delivery of radiotherapy, this patient underwent respiratory motion management simulation.  To accomplish this, when the patient was brought to the CT simulation planning suite, 4D respiratoy motion management CT images were obtained.  The CT images were loaded into the planning software.  Then, using a variety of tools including Cine, MIP, and standard views, the target volume and planning target volumes (PTV) were delineated.  Avoidance structures were contoured.  Treatment planning then occurred.  Dose volume histograms were generated and reviewed for each of the requested structure.  The resulting plan was carefully reviewed and approved today.  PLAN:  The patient will receive 54 Gy in 3 fractions.  ________________________________  Sheral Apley Tammi Klippel, M.D.    Addendum:  The degree of respiratory motion with a 2 cm excursion of the target, despite abdominal compression was considered to large of an expansion of the high dose target region.  Following review with physics, the patient will return for additional planning, using a Deep Inspiration Breath Hold (DIBH) technique

## 2021-11-01 ENCOUNTER — Ambulatory Visit: Payer: Medicare Other | Admitting: Radiation Oncology

## 2021-11-04 ENCOUNTER — Ambulatory Visit
Admission: RE | Admit: 2021-11-04 | Discharge: 2021-11-04 | Disposition: A | Discharge status: Home / Self Care | Payer: Medicare Other | Source: Ambulatory Visit | Attending: Radiation Oncology | Admitting: Radiation Oncology

## 2021-11-04 DIAGNOSIS — C3431 Malignant neoplasm of lower lobe, right bronchus or lung: Secondary | ICD-10-CM

## 2021-11-04 NOTE — Progress Notes (Signed)
  Radiation Oncology         [phone] ________________________________  Name: [name] MRN: [id number]  Date: 11/04/2021  DOB: Jul 17, 1950  Complex SIMULATION NOTE  NARRATIVE:  The patient underwent simulation today for DIBH radiation therapy.  A new DIBH CT study set was acquired for the purpose of virtual treatment planning.  The target and avoidance structures were contoured.  Treatment planning then occurred.  The radiation prescription was entered and confirmed.  Multiple complex treatment devices were fabricated in the form of multi-leaf collimators to shape radiation around the targets while maximally excluding nearby normal structures.Marland Kitchen   PLAN:  This DIBH set up will be used for treatment.  ------------------------------------------------  Sheral Apley. Tammi Klippel, M.D.

## 2021-11-06 ENCOUNTER — Ambulatory Visit: Payer: Medicare Other | Admitting: Radiation Oncology

## 2021-11-08 ENCOUNTER — Ambulatory Visit: Payer: Medicare Other | Admitting: Radiation Oncology

## 2021-11-11 ENCOUNTER — Ambulatory Visit: Payer: Medicare Other | Admitting: Radiation Oncology

## 2021-11-12 DIAGNOSIS — C3431 Malignant neoplasm of lower lobe, right bronchus or lung: Secondary | ICD-10-CM | POA: Diagnosis not present

## 2021-11-13 ENCOUNTER — Ambulatory Visit
Admission: RE | Admit: 2021-11-13 | Discharge: 2021-11-13 | Disposition: A | Discharge status: Home / Self Care | Payer: Medicare Other | Source: Ambulatory Visit | Attending: Radiation Oncology | Admitting: Radiation Oncology

## 2021-11-13 ENCOUNTER — Other Ambulatory Visit: Payer: Self-pay

## 2021-11-13 DIAGNOSIS — C3431 Malignant neoplasm of lower lobe, right bronchus or lung: Secondary | ICD-10-CM | POA: Diagnosis not present

## 2021-11-13 LAB — RAD ONC ARIA SESSION SUMMARY
Course Elapsed Days: 0
Plan Fractions Treated to Date: 1
Plan Prescribed Dose Per Fraction: 18 Gy
Plan Total Fractions Prescribed: 3
Plan Total Prescribed Dose: 54 Gy
Reference Point Dosage Given to Date: 18 Gy
Reference Point Session Dosage Given: 18 Gy
Session Number: 1

## 2021-11-15 ENCOUNTER — Ambulatory Visit
Admission: RE | Admit: 2021-11-15 | Discharge: 2021-11-15 | Disposition: A | Discharge status: Home / Self Care | Payer: Medicare Other | Source: Ambulatory Visit | Attending: Radiation Oncology | Admitting: Radiation Oncology

## 2021-11-15 ENCOUNTER — Other Ambulatory Visit: Payer: Self-pay

## 2021-11-15 DIAGNOSIS — C3431 Malignant neoplasm of lower lobe, right bronchus or lung: Secondary | ICD-10-CM

## 2021-11-15 LAB — RAD ONC ARIA SESSION SUMMARY
Course Elapsed Days: 2
Plan Fractions Treated to Date: 2
Plan Prescribed Dose Per Fraction: 18 Gy
Plan Total Fractions Prescribed: 3
Plan Total Prescribed Dose: 54 Gy
Reference Point Dosage Given to Date: 36 Gy
Reference Point Session Dosage Given: 18 Gy
Session Number: 2

## 2021-11-18 ENCOUNTER — Encounter: Payer: Self-pay | Admitting: Urology

## 2021-11-18 ENCOUNTER — Other Ambulatory Visit: Payer: Self-pay

## 2021-11-18 ENCOUNTER — Ambulatory Visit
Admission: RE | Admit: 2021-11-18 | Discharge: 2021-11-18 | Disposition: A | Discharge status: Home / Self Care | Payer: Medicare Other | Source: Ambulatory Visit | Attending: Radiation Oncology | Admitting: Radiation Oncology

## 2021-11-18 DIAGNOSIS — C3431 Malignant neoplasm of lower lobe, right bronchus or lung: Secondary | ICD-10-CM | POA: Diagnosis not present

## 2021-11-18 LAB — RAD ONC ARIA SESSION SUMMARY
Course Elapsed Days: 5
Plan Fractions Treated to Date: 3
Plan Prescribed Dose Per Fraction: 18 Gy
Plan Total Fractions Prescribed: 3
Plan Total Prescribed Dose: 54 Gy
Reference Point Dosage Given to Date: 54 Gy
Reference Point Session Dosage Given: 18 Gy
Session Number: 3

## 2021-12-19 NOTE — Progress Notes (Signed)
                                                                                                                                                             Patient Name: [name] MRN: [id number] DOB: [birth year] Referring Physician: Dineen Kid (Profile Not Attached) Date of Service: 11/18/2021 [facility], [place]                                                        End Of Treatment Note  Diagnoses: C34.31-Malignant neoplasm of lower lobe, right bronchus or lung  Cancer Staging:  71 yo man with a new, Stage IA metachronous NSCLC, squamous cell carcinoma of the RLL lung and a history of a previously treated Stage IIIA NSCLC, squamous cell carcinoma of the LUL lung.   Intent: Curative  Radiation Treatment Dates: 11/13/2021 through 11/18/2021 Site Technique Total Dose (Gy) Dose per Fx (Gy) Completed Fx Beam Energies  Lung, Right: Lung_R IMRT 54/54 18 3/3 6XFFF   Narrative: The patient tolerated radiation therapy relatively well without any ill side effects.  Plan: The patient will receive a call in about one month from the radiation oncology department. He will continue follow up with Dr. Julien Nordmann as well. He has a scheduled follow up with Dr. Julien Nordmann on 02/18/22 with restaging CT C/A/P imaging prior to that visit. ------------------------------------------------   Tyler Pita, MD [place]: [phone]  Fax: [phone] [email or web address]  Skype  [email or web address]

## 2022-01-13 NOTE — Progress Notes (Signed)
  Radiation Oncology         [phone] ________________________________  Name: MERIT MAYBEE MRN: [id number]  Date of Service: 01/14/2022  DOB: [birth year]  Post Treatment Telephone Note  Diagnosis:  72 yo man with a new, Stage IA metachronous NSCLC, squamous cell carcinoma of the RLL lung and a history of a previously treated Stage IIIA NSCLC, squamous cell carcinoma of the LUL lung.   Intent: Curative  Radiation Treatment Dates: 11/13/2021 through 11/18/2021 Site Technique Total Dose (Gy) Dose per Fx (Gy) Completed Fx Beam Energies  Lung, Right: Lung_R       (as documented in provider EOT note)   The patient was not available for call today. A detailed voicemail was left.   The patient has scheduled follow up with his medical oncologist Dr. Curt Bears for ongoing care, and was encouraged to call if he  develops concerns or questions regarding radiation.    Leandra Kern, LPN

## 2022-01-14 ENCOUNTER — Ambulatory Visit
Admission: RE | Admit: 2022-01-14 | Discharge: 2022-01-14 | Disposition: A | Discharge status: Home / Self Care | Payer: Medicare Other | Source: Ambulatory Visit | Attending: Radiation Oncology | Admitting: Radiation Oncology

## 2022-01-14 DIAGNOSIS — C3431 Malignant neoplasm of lower lobe, right bronchus or lung: Secondary | ICD-10-CM | POA: Insufficient documentation

## 2022-02-11 ENCOUNTER — Ambulatory Visit (HOSPITAL_COMMUNITY)
Admission: RE | Admit: 2022-02-11 | Discharge: 2022-02-11 | Disposition: A | Discharge status: Home / Self Care | Payer: Medicare Other | Source: Ambulatory Visit | Attending: Internal Medicine | Admitting: Internal Medicine

## 2022-02-11 ENCOUNTER — Inpatient Hospital Stay: Payer: Medicare Other | Attending: Internal Medicine

## 2022-02-11 DIAGNOSIS — Z923 Personal history of irradiation: Secondary | ICD-10-CM | POA: Diagnosis not present

## 2022-02-11 DIAGNOSIS — C3490 Malignant neoplasm of unspecified part of unspecified bronchus or lung: Secondary | ICD-10-CM

## 2022-02-11 DIAGNOSIS — F17210 Nicotine dependence, cigarettes, uncomplicated: Secondary | ICD-10-CM | POA: Diagnosis not present

## 2022-02-11 DIAGNOSIS — Z85118 Personal history of other malignant neoplasm of bronchus and lung: Secondary | ICD-10-CM | POA: Insufficient documentation

## 2022-02-11 DIAGNOSIS — Z905 Acquired absence of kidney: Secondary | ICD-10-CM | POA: Insufficient documentation

## 2022-02-11 DIAGNOSIS — Z85528 Personal history of other malignant neoplasm of kidney: Secondary | ICD-10-CM | POA: Diagnosis not present

## 2022-02-11 DIAGNOSIS — Z9221 Personal history of antineoplastic chemotherapy: Secondary | ICD-10-CM | POA: Insufficient documentation

## 2022-02-11 LAB — CBC WITH DIFFERENTIAL (CANCER CENTER ONLY)
Abs Immature Granulocytes: 0.03 10*3/uL (ref 0.00–0.07)
Basophils Absolute: 0 10*3/uL (ref 0.0–0.1)
Basophils Relative: 0 %
Eosinophils Absolute: 0.1 10*3/uL (ref 0.0–0.5)
Eosinophils Relative: 1 %
HCT: 45.4 % (ref 39.0–52.0)
Hemoglobin: 15 g/dL (ref 13.0–17.0)
Immature Granulocytes: 0 %
Lymphocytes Relative: 16 %
Lymphs Abs: 1.2 10*3/uL (ref 0.7–4.0)
MCH: 29.6 pg (ref 26.0–34.0)
MCHC: 33 g/dL (ref 30.0–36.0)
MCV: 89.7 fL (ref 80.0–100.0)
Monocytes Absolute: 0.5 10*3/uL (ref 0.1–1.0)
Monocytes Relative: 7 %
Neutro Abs: 5.7 10*3/uL (ref 1.7–7.7)
Neutrophils Relative %: 76 %
Platelet Count: 261 10*3/uL (ref 150–400)
RBC: 5.06 MIL/uL (ref 4.22–5.81)
RDW: 13.8 % (ref 11.5–15.5)
WBC Count: 7.5 10*3/uL (ref 4.0–10.5)
nRBC: 0 % (ref 0.0–0.2)

## 2022-02-11 LAB — CMP (CANCER CENTER ONLY)
ALT: 7 U/L (ref 0–44)
AST: 11 U/L — ABNORMAL LOW (ref 15–41)
Albumin: 3.7 g/dL (ref 3.5–5.0)
Alkaline Phosphatase: 93 U/L (ref 38–126)
Anion gap: 3 — ABNORMAL LOW (ref 5–15)
BUN: 16 mg/dL (ref 8–23)
CO2: 31 mmol/L (ref 22–32)
Calcium: 9.5 mg/dL (ref 8.9–10.3)
Chloride: 105 mmol/L (ref 98–111)
Creatinine: 1.34 mg/dL — ABNORMAL HIGH (ref 0.61–1.24)
GFR, Estimated: 56 mL/min — ABNORMAL LOW (ref >60)
Glucose, Bld: 93 mg/dL (ref 70–99)
Potassium: 4.7 mmol/L (ref 3.5–5.1)
Sodium: 139 mmol/L (ref 135–145)
Total Bilirubin: 0.4 mg/dL (ref 0.3–1.2)
Total Protein: 6.4 g/dL — ABNORMAL LOW (ref 6.5–8.1)

## 2022-02-11 MED ORDER — SODIUM CHLORIDE (PF) 0.9 % IJ SOLN
INTRAMUSCULAR | Status: AC
  Filled 2022-02-11: qty 50

## 2022-02-11 MED ORDER — IOHEXOL 300 MG/ML  SOLN
100.0000 mL | Freq: Once | INTRAMUSCULAR | Status: AC | PRN
  Administered 2022-02-11: 100 mL via INTRAVENOUS

## 2022-02-18 ENCOUNTER — Other Ambulatory Visit: Payer: Self-pay

## 2022-02-18 ENCOUNTER — Inpatient Hospital Stay: Payer: Medicare Other | Admitting: Internal Medicine

## 2022-02-18 VITALS — BP 119/77 | HR 88 | Temp 98.4°F | Resp 16 | Wt 165.9 lb

## 2022-02-18 DIAGNOSIS — C3490 Malignant neoplasm of unspecified part of unspecified bronchus or lung: Secondary | ICD-10-CM

## 2022-02-18 DIAGNOSIS — Z85118 Personal history of other malignant neoplasm of bronchus and lung: Secondary | ICD-10-CM | POA: Diagnosis not present

## 2022-02-18 NOTE — Progress Notes (Signed)
[facility] [phone]   [phone]  OFFICE PROGRESS NOTE  Via, Lennette Bihari, MD [place] [address]  DIAGNOSIS:  1) Stage IIIA (T2a, N2, M0) non-small cell lung cancer consistent with squamous cell carcinoma involving the left upper lobe and AP window lymphadenopathy diagnosed in November 2014. 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  The patient had metastatic renal cell carcinoma and a subxiphoid subcutaneous nodule status post resection on 08/13/2021. 4) stage Ia (T1c, N0, M0) non-small cell lung cancer, squamous cell carcinoma presented with right lower lobe lung nodule diagnosed in September 2023.  PRIOR THERAPY:  1) Concurrent chemoradiation with weekly carboplatin for AUC of 2 and paclitaxel 45 mg/M2, status post 7 cycles, last dose was given 01/31/2013. 2) Status post left laparoscopic radical nephrectomy under the care of Dr. Louis Meckel on 04/04/2013 3) is post SBRT to the right lower lobe lung nodule under the care of Dr. Tammi Klippel.  CURRENT THERAPY: The patient is expected to undergo curative SBRT to this nodule under the care of Dr. Tammi Klippel in the next few weeks.  INTERVAL HISTORY: [name] 72 y.o. male returns to the clinic today for follow-up visit.  He was accompanied by his wife.  The patient is feeling fine today with no concerning complaints except for the baseline shortness of breath and mild cough.  He has no chest pain or hemoptysis.  He has no nausea, vomiting, diarrhea or constipation.  He has no headache or visual changes.  He has no recent weight loss or night sweats.  He had repeat CT scan of the chest, abdomen and pelvis performed recently and he is here for evaluation and discussion of his scan results.   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  MEDICATIONS:  Current Outpatient Medications  Medication Sig Dispense Refill   ASTAXANTHIN PO Take 1 capsule by mouth in the morning.     nicotine polacrilex (NICORETTE) 4 MG gum Take 4 mg by mouth as needed for smoking cessation.     Oxycodone HCl 10 MG TABS Take 1 tablet (10 mg total) by mouth 5 (five) times daily. 15 tablet 0   Probiotic Product (PROBIOTIC DAILY PO) Take 1 capsule by mouth daily. 70 Billion cfu/ 10 strains     TURMERIC CURCUMIN PO Take 1 capsule by mouth in the morning.     Vitamin D-Vitamin K (VITAMIN K2-VITAMIN D3 PO) Take 1 tablet by mouth in the morning. 125 mcg/ 180 mcg  Zinc Sulfate (ZINC 15 PO) Take 1 tablet by mouth daily. Zinc with Selenium 200 mcg     No current facility-administered medications for this visit.    SURGICAL HISTORY:  Past Surgical History:  Procedure Laterality Date   APPENDECTOMY     BRONCHIAL BIOPSY  09/24/2021   Procedure: BRONCHIAL BIOPSIES;  Surgeon: Garner Nash, DO;  Location: [facility];  Service: Pulmonary;;   COLONOSCOPY     FIDUCIAL MARKER PLACEMENT  09/24/2021   Procedure: FIDUCIAL MARKER PLACEMENT;  Surgeon: Garner Nash, DO;  Location: [facility];  Service: Pulmonary;;   HERNIA REPAIR     x 2   LAPAROSCOPIC NEPHRECTOMY Left 04/04/2013   Procedure: LAPAROSCOPIC LEFT  RADICAL NEPHRECTOMY ;  Surgeon: [name], MD;  Location: [facility];  Service: Urology;  Laterality: Left;   LUNG BIOPSY Left 08/08/2016   Procedure: LEFT LUNG BIOPSY;  Surgeon: Grace Isaac, MD;  Location: [facility];  Service: Thoracic;  Laterality: Left;   MASS EXCISION N/A 08/13/2021   Procedure: EXCISION OF SUBXIPHOID SUBCUTANEOUS  NODULE;  Surgeon: Johnathan Hausen, MD;  Location: [facility];  Service: General;  Laterality: N/A;   ROBOTIC ADRENALECTOMY Left 02/17/2020   Procedure: XI ROBOTIC LEFT ADRENALECTOMY;  Surgeon: [name], MD;  Location: [facility];  Service: Urology;  Laterality: Left;  REQUESTING 4HRS   testicular removal  2000   VENTRAL HERNIA REPAIR N/A 08/13/2021   Procedure: LAPAROSCOPIC VENTRAL HERNIA WITH MESH;  Surgeon: Johnathan Hausen, MD;  Location: [facility];  Service: General;  Laterality: N/A;   VIDEO BRONCHOSCOPY N/A 11/30/2012   Procedure: VIDEO BRONCHOSCOPY WITH FLUORO;  Surgeon: Elsie Stain, MD;  Location: [facility];  Service: Cardiopulmonary;  Laterality: N/A;   VIDEO BRONCHOSCOPY N/A 08/08/2016   Procedure: VIDEO BRONCHOSCOPY;  Surgeon: Grace Isaac, MD;  Location: [facility];  Service: Thoracic;  Laterality: N/A;    REVIEW OF SYSTEMS:  A comprehensive review of systems was negative except for: Constitutional: positive for fatigue Respiratory: positive for dyspnea on exertion   PHYSICAL EXAMINATION: General appearance: alert, cooperative, fatigued, and no distress Head: Normocephalic, without obvious abnormality, atraumatic Neck: no adenopathy, no JVD, supple, symmetrical, trachea midline, and thyroid not enlarged, symmetric, no tenderness/mass/nodules Lymph nodes: Cervical, supraclavicular, and axillary nodes normal. Resp: clear to auscultation bilaterally Back: symmetric, no curvature. ROM normal. No CVA tenderness. Cardio: regular rate and rhythm, S1, S2 normal, no murmur, click, rub or gallop GI: soft, non-tender; bowel sounds normal; no masses,   no organomegaly Extremities: extremities normal, atraumatic, no cyanosis or edema  ECOG PERFORMANCE STATUS: 1 - Symptomatic but completely ambulatory  Blood pressure 119/77, pulse 88, temperature 98.4 F (36.9 C), temperature source Oral, resp. rate 16, weight 165 lb 14.4 oz (75.3 kg), SpO2 95 %.  LABORATORY DATA: Lab Results  Component Value Date   WBC 7.5 02/11/2022   HGB 15.0 02/11/2022   HCT 45.4 02/11/2022   MCV 89.7 02/11/2022   PLT 261 02/11/2022      Chemistry      Component Value Date/Time   NA 139 02/11/2022 1455   NA 142 06/30/2016 1406   K 4.7 02/11/2022 1455   K 4.8 06/30/2016 1406   CL 105 02/11/2022 1455   CO2 31 02/11/2022 1455   CO2 29 06/30/2016 1406   BUN 16 02/11/2022 1455   BUN 14.8 06/30/2016 1406   CREATININE 1.34 (H) 02/11/2022 1455   CREATININE 1.4 (H) 06/30/2016 1406      Component Value Date/Time  CALCIUM 9.5 02/11/2022 1455   CALCIUM 9.8 06/30/2016 1406   ALKPHOS 93 02/11/2022 1455   ALKPHOS 122 06/30/2016 1406   AST 11 (L) 02/11/2022 1455   AST 11 06/30/2016 1406   ALT 7 02/11/2022 1455   ALT 13 06/30/2016 1406   BILITOT 0.4 02/11/2022 1455   BILITOT 0.40 06/30/2016 1406       RADIOGRAPHIC STUDIES: CT Chest W Contrast  Result Date: 02/11/2022 CLINICAL DATA:  Left upper lobe lung cancer restaging, additional history of left renal cell carcinoma and left testicular cancer * Tracking Code: BO * EXAM: CT CHEST, ABDOMEN, AND PELVIS WITH CONTRAST TECHNIQUE: Multidetector CT imaging of the chest, abdomen and pelvis was performed following the standard protocol during bolus administration of intravenous contrast. RADIATION DOSE REDUCTION: This exam was performed according to the departmental dose-optimization program which includes automated exposure control, adjustment of the mA and/or kV according to patient size and/or use of iterative reconstruction technique. CONTRAST:  136mL OMNIPAQUE IOHEXOL 300 MG/ML  SOLN COMPARISON:  10/04/2021  FINDINGS: CT CHEST FINDINGS Cardiovascular: Aortic atherosclerosis. Normal heart size. Left coronary artery calcifications. Small pericardial effusion unchanged. Mediastinum/Nodes: Unchanged, matted left hilar and AP window soft tissue measuring 2.7 x 1.9 cm (series 2, image 23). No discretely enlarged mediastinal or hilar lymph nodes. Thyroid gland, trachea, and esophagus demonstrate no significant findings. Lungs/Pleura: Diffuse bilateral bronchial wall thickening. Unchanged post treatment/post radiation appearance of consolidation and fibrosis of the perihilar and suprahilar left lung (series 4, image 50). Slightly diminished size of a cystic lesion of the dependent right lower lobe measuring 1.6 x 1.4 cm previously 1.8 x 1.5 cm when measured similarly (series 4, image 74). No pleural effusion or pneumothorax. Musculoskeletal: No chest wall abnormality. No acute osseous findings. CT ABDOMEN PELVIS FINDINGS Hepatobiliary: No solid liver abnormality is seen. Unchanged small hemangioma of the right lobe of the liver, benign, for which no further follow-up or characterization is required (series 2, image 60). No gallstones, gallbladder wall thickening, or biliary dilatation. Pancreas: Unremarkable. No pancreatic ductal dilatation or surrounding inflammatory changes. Spleen: Normal in size without significant abnormality. Adrenals/Urinary Tract: Right adrenal gland is normal. Status post left nephrectomy and adrenalectomy. No suspicious soft tissue or contrast in the nephrectomy bed. Unchanged simple, cyst inferior pole of the right kidney 0.2 x 0 9 cm (series 7, image 23). Right kidney is otherwise normal, without renal calculi, solid lesion, or hydronephrosis. Bladder is unremarkable. Stomach/Bowel: Stomach is within normal limits. Appendix appears normal. No evidence of bowel wall thickening, distention, or inflammatory changes. Vascular/Lymphatic: Severe mixed aortic atherosclerosis. No enlarged abdominal or  pelvic lymph nodes. Reproductive: No mass.  Status post left orchiectomy. Other: Midline ventral hernia mesh repair.  No ascites. Musculoskeletal: No acute osseous findings. IMPRESSION: 1. Unchanged post treatment/post radiation appearance of consolidation and fibrosis of the perihilar and suprahilar left lung. 2. Unchanged, matted left hilar and AP window soft tissue. No discretely enlarged mediastinal or hilar lymph nodes. 3. Slightly diminished size of a cystic lesion of the dependent right lower lobe measuring 1.6 x 1.4 cm previously 1.8 x 1.5 cm when measured similarly. 4. Status post left nephrectomy and adrenalectomy. No evidence of recurrent or metastatic disease in the abdomen or pelvis. 5. Coronary artery disease. Aortic Atherosclerosis (ICD10-I70.0). Electronically Signed   By: Delanna Ahmadi M.D.   On: 02/11/2022 23:53   CT Abdomen Pelvis W Contrast  Result Date: 02/11/2022 CLINICAL DATA:  Left upper lobe lung cancer restaging, additional history of left renal cell  carcinoma and left testicular cancer * Tracking Code: BO * EXAM: CT CHEST, ABDOMEN, AND PELVIS WITH CONTRAST TECHNIQUE: Multidetector CT imaging of the chest, abdomen and pelvis was performed following the standard protocol during bolus administration of intravenous contrast. RADIATION DOSE REDUCTION: This exam was performed according to the departmental dose-optimization program which includes automated exposure control, adjustment of the mA and/or kV according to patient size and/or use of iterative reconstruction technique. CONTRAST:  154mL OMNIPAQUE IOHEXOL 300 MG/ML  SOLN COMPARISON:  10/04/2021 FINDINGS: CT CHEST FINDINGS Cardiovascular: Aortic atherosclerosis. Normal heart size. Left coronary artery calcifications. Small pericardial effusion unchanged. Mediastinum/Nodes: Unchanged, matted left hilar and AP window soft tissue measuring 2.7 x 1.9 cm (series 2, image 23). No discretely enlarged mediastinal or hilar lymph nodes. Thyroid  gland, trachea, and esophagus demonstrate no significant findings. Lungs/Pleura: Diffuse bilateral bronchial wall thickening. Unchanged post treatment/post radiation appearance of consolidation and fibrosis of the perihilar and suprahilar left lung (series 4, image 50). Slightly diminished size of a cystic lesion of the dependent right lower lobe measuring 1.6 x 1.4 cm previously 1.8 x 1.5 cm when measured similarly (series 4, image 74). No pleural effusion or pneumothorax. Musculoskeletal: No chest wall abnormality. No acute osseous findings. CT ABDOMEN PELVIS FINDINGS Hepatobiliary: No solid liver abnormality is seen. Unchanged small hemangioma of the right lobe of the liver, benign, for which no further follow-up or characterization is required (series 2, image 60). No gallstones, gallbladder wall thickening, or biliary dilatation. Pancreas: Unremarkable. No pancreatic ductal dilatation or surrounding inflammatory changes. Spleen: Normal in size without significant abnormality. Adrenals/Urinary Tract: Right adrenal gland is normal. Status post left nephrectomy and adrenalectomy. No suspicious soft tissue or contrast in the nephrectomy bed. Unchanged simple, cyst inferior pole of the right kidney 0.2 x 0 9 cm (series 7, image 23). Right kidney is otherwise normal, without renal calculi, solid lesion, or hydronephrosis. Bladder is unremarkable. Stomach/Bowel: Stomach is within normal limits. Appendix appears normal. No evidence of bowel wall thickening, distention, or inflammatory changes. Vascular/Lymphatic: Severe mixed aortic atherosclerosis. No enlarged abdominal or pelvic lymph nodes. Reproductive: No mass.  Status post left orchiectomy. Other: Midline ventral hernia mesh repair.  No ascites. Musculoskeletal: No acute osseous findings. IMPRESSION: 1. Unchanged post treatment/post radiation appearance of consolidation and fibrosis of the perihilar and suprahilar left lung. 2. Unchanged, matted left hilar and AP  window soft tissue. No discretely enlarged mediastinal or hilar lymph nodes. 3. Slightly diminished size of a cystic lesion of the dependent right lower lobe measuring 1.6 x 1.4 cm previously 1.8 x 1.5 cm when measured similarly. 4. Status post left nephrectomy and adrenalectomy. No evidence of recurrent or metastatic disease in the abdomen or pelvis. 5. Coronary artery disease. Aortic Atherosclerosis (ICD10-I70.0). Electronically Signed   By: Delanna Ahmadi M.D.   On: 02/11/2022 23:53     ASSESSMENT AND PLAN:  This is a very pleasant 72 years old white male with history of stage IIIa non-small cell lung cancer status post concurrent chemoradiation with weekly carboplatin and paclitaxel followed by observation since the patient was noted good candidate for consolidation chemotherapy at that time. He also has a history of renal cell carcinoma status post left radical nephrectomy. The patient has been in observation since January 2015. The patient has been on observation since that time and he is feeling fine with no concerning complaints. Repeat CT scan of the chest, abdomen pelvis performed recently showed enlarging left adrenal mass but the mass has low hypermetabolic activity on  the previous PET scan in September 2020. The patient has been on observation since that time and he is feeling fine with no concerning complaints. Repeat CT scan of the chest, abdomen pelvis as well as PET scan on June 26, 2021 showed interval increase in the size of the 1.7 x 1.4 cm subsolid nodule in the right lower lobe noted to be hypermetabolic on the PET scan and suspicious for metachronous primary neoplasm.  Metastatic disease not entirely excluded.  The patient also has a supple sclerotic lesion in the left aspect of the T12 vertebral body with low-level hypermetabolism on the PET scan and a 1.6 cm enhancing subcutaneous lesion in the midline epigastric anterior abdominal wall increased in size from 0.7 cm on CT of the  abdomen pelvis in May 2022. The patient recently underwent a hernia repair with excision of the subxiphoid subcutaneous nodule and the final pathology was consistent with metastatic renal cell carcinoma. The patient underwent palliative radiotherapy to the right lower lobe lung lesion under the care of Dr. Tammi Klippel. He had repeat CT scan of the chest, abdomen and pelvis performed recently that showed no concerning findings for disease progression or recurrence. I recommended for him to continue on observation with repeat CT scan of the chest, abdomen and pelvis in 6 months. Regarding the metastatic renal cell carcinoma, we will continue to monitor him closely for any disease recurrence. For the smoking cessation I strongly encouraged the patient to quit smoking. The patient was advised to call immediately if he has any other concerning symptoms in the interval.  Disclaimer: This note was dictated with voice recognition software. Similar sounding words can inadvertently be transcribed and may not be corrected upon review.

## 2022-08-15 ENCOUNTER — Other Ambulatory Visit: Payer: Self-pay

## 2022-08-15 ENCOUNTER — Ambulatory Visit (HOSPITAL_COMMUNITY)
Admission: RE | Admit: 2022-08-15 | Discharge: 2022-08-15 | Disposition: A | Discharge status: Home / Self Care | Payer: Medicare Other | Source: Ambulatory Visit | Attending: Internal Medicine | Admitting: Internal Medicine

## 2022-08-15 ENCOUNTER — Encounter (HOSPITAL_COMMUNITY): Payer: Self-pay

## 2022-08-15 ENCOUNTER — Ambulatory Visit (HOSPITAL_COMMUNITY): Payer: Medicare Other

## 2022-08-15 ENCOUNTER — Inpatient Hospital Stay: Payer: Medicare Other | Attending: Internal Medicine

## 2022-08-15 DIAGNOSIS — Z923 Personal history of irradiation: Secondary | ICD-10-CM | POA: Insufficient documentation

## 2022-08-15 DIAGNOSIS — Z85118 Personal history of other malignant neoplasm of bronchus and lung: Secondary | ICD-10-CM | POA: Diagnosis present

## 2022-08-15 DIAGNOSIS — Z905 Acquired absence of kidney: Secondary | ICD-10-CM | POA: Insufficient documentation

## 2022-08-15 DIAGNOSIS — C3490 Malignant neoplasm of unspecified part of unspecified bronchus or lung: Secondary | ICD-10-CM | POA: Insufficient documentation

## 2022-08-15 DIAGNOSIS — F17210 Nicotine dependence, cigarettes, uncomplicated: Secondary | ICD-10-CM | POA: Diagnosis not present

## 2022-08-15 DIAGNOSIS — Z85528 Personal history of other malignant neoplasm of kidney: Secondary | ICD-10-CM | POA: Insufficient documentation

## 2022-08-15 DIAGNOSIS — Z9221 Personal history of antineoplastic chemotherapy: Secondary | ICD-10-CM | POA: Insufficient documentation

## 2022-08-15 LAB — CMP (CANCER CENTER ONLY)
ALT: 12 U/L (ref 0–44)
AST: 14 U/L — ABNORMAL LOW (ref 15–41)
Albumin: 3.9 g/dL (ref 3.5–5.0)
Alkaline Phosphatase: 101 U/L (ref 38–126)
Anion gap: 4 — ABNORMAL LOW (ref 5–15)
BUN: 18 mg/dL (ref 8–23)
CO2: 31 mmol/L (ref 22–32)
Calcium: 9.4 mg/dL (ref 8.9–10.3)
Chloride: 104 mmol/L (ref 98–111)
Creatinine: 1.37 mg/dL — ABNORMAL HIGH (ref 0.61–1.24)
GFR, Estimated: 54 mL/min — ABNORMAL LOW (ref >60)
Glucose, Bld: 98 mg/dL (ref 70–99)
Potassium: 5.2 mmol/L — ABNORMAL HIGH (ref 3.5–5.1)
Sodium: 139 mmol/L (ref 135–145)
Total Bilirubin: 0.4 mg/dL (ref 0.3–1.2)
Total Protein: 6.9 g/dL (ref 6.5–8.1)

## 2022-08-15 LAB — CBC WITH DIFFERENTIAL (CANCER CENTER ONLY)
Abs Immature Granulocytes: 0.03 10*3/uL (ref 0.00–0.07)
Basophils Absolute: 0 10*3/uL (ref 0.0–0.1)
Basophils Relative: 1 %
Eosinophils Absolute: 0.2 10*3/uL (ref 0.0–0.5)
Eosinophils Relative: 2 %
HCT: 47.1 % (ref 39.0–52.0)
Hemoglobin: 15.8 g/dL (ref 13.0–17.0)
Immature Granulocytes: 0 %
Lymphocytes Relative: 17 %
Lymphs Abs: 1.2 10*3/uL (ref 0.7–4.0)
MCH: 30.2 pg (ref 26.0–34.0)
MCHC: 33.5 g/dL (ref 30.0–36.0)
MCV: 90.1 fL (ref 80.0–100.0)
Monocytes Absolute: 0.5 10*3/uL (ref 0.1–1.0)
Monocytes Relative: 7 %
Neutro Abs: 5.3 10*3/uL (ref 1.7–7.7)
Neutrophils Relative %: 73 %
Platelet Count: 281 10*3/uL (ref 150–400)
RBC: 5.23 MIL/uL (ref 4.22–5.81)
RDW: 13.4 % (ref 11.5–15.5)
WBC Count: 7.2 10*3/uL (ref 4.0–10.5)
nRBC: 0 % (ref 0.0–0.2)

## 2022-08-20 ENCOUNTER — Inpatient Hospital Stay: Payer: Medicare Other | Admitting: Internal Medicine

## 2022-08-20 ENCOUNTER — Other Ambulatory Visit: Payer: Self-pay

## 2022-08-20 VITALS — BP 122/88 | HR 95 | Temp 98.1°F | Resp 16 | Ht 67.0 in | Wt 164.8 lb

## 2022-08-20 DIAGNOSIS — Z85118 Personal history of other malignant neoplasm of bronchus and lung: Secondary | ICD-10-CM | POA: Diagnosis not present

## 2022-08-20 DIAGNOSIS — C3490 Malignant neoplasm of unspecified part of unspecified bronchus or lung: Secondary | ICD-10-CM

## 2022-08-20 NOTE — Progress Notes (Signed)
[facility] [phone]   Fax:([phone]  OFFICE PROGRESS NOTE  Via, Caryn Bee, MD 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  The patient had metastatic renal cell carcinoma and a subxiphoid subcutaneous nodule status post resection on 08/13/2021. 4) stage Ia (T1c, N0, M0) non-small cell lung cancer, squamous cell carcinoma presented with right lower lobe lung nodule diagnosed in September 2023.  PRIOR THERAPY:  1) Concurrent chemoradiation with weekly carboplatin for AUC of 2 and paclitaxel 45 mg/M2, status post 7 cycles, last dose was given 01/31/2013. 2) Status post left laparoscopic radical nephrectomy under the care of Dr. Marlou Porch on 04/04/2013 3) is post SBRT to the right lower lobe lung nodule under the care of Dr. Kathrynn Running.  CURRENT THERAPY: The patient is expected to undergo curative SBRT to this nodule under the care of Dr. Kathrynn Running in the next few weeks.  INTERVAL HISTORY: [name] 72 y.o. male returns to the clinic today for 6 months follow-up visit.  The patient is feeling fine today with no concerning complaints except for the baseline shortness of breath.  He denied having any chest pain, cough or hemoptysis.  He has no nausea, vomiting, diarrhea or constipation.  He has no headache or visual changes.  He has no recent weight loss or night sweats.  Unfortunately he continues to smoke at regular basis.  He had repeat CT scan of the chest, abdomen and pelvis performed recently and he is here for evaluation and discussion of his scan results.  MEDICAL HISTORY: Past Medical History:   Diagnosis Date   Adrenal cancer, left (HCC)    Arthritis    Cancer of kidney (HCC)    left   Complication of anesthesia    Very little movement of neck- "self fusioning"   COPD (chronic obstructive pulmonary disease) (HCC)    Crohn's disease (HCC)    no issues since 2012   Difficulty sleeping    occasionally   Dyspnea    with a little exertion,  "smoked cigarettes all my life"   History of blood transfusion    "chron's flare"   Lung cancer (HCC)    Renal cell carcinoma of left kidney (HCC) 02/20/2015   Renal mass    Renal mass, left    Sleep apnea 05/2016   Testicular cancer (HCC) 2000   s/p resection, no recurrence    ALLERGIES:  is allergic to ativan [lorazepam].  MEDICATIONS:  Current Outpatient Medications  Medication Sig Dispense Refill   ASTAXANTHIN PO Take 1 capsule by mouth in the morning.     nicotine polacrilex (NICORETTE) 4 MG gum Take 4 mg by mouth as needed for smoking cessation.     Oxycodone HCl 10 MG TABS Take 1 tablet (10 mg total) by mouth 5 (five) times daily. 15 tablet 0   Probiotic Product (PROBIOTIC DAILY PO) Take 1 capsule by mouth daily. 70 Billion cfu/ 10 strains     TURMERIC CURCUMIN PO Take 1 capsule by mouth in the morning.     Vitamin D-Vitamin K (VITAMIN K2-VITAMIN D3 PO) Take 1 tablet by mouth in the morning. 125 mcg/ 180 mcg  Zinc Sulfate (ZINC 15 PO) Take 1 tablet by mouth daily. Zinc with Selenium 200 mcg     No current facility-administered medications for this visit.    SURGICAL HISTORY:  Past Surgical History:  Procedure Laterality Date   APPENDECTOMY     BRONCHIAL BIOPSY  09/24/2021   Procedure: BRONCHIAL BIOPSIES;  Surgeon: Josephine Igo, DO;  Location: [facility];  Service: Pulmonary;;   COLONOSCOPY     FIDUCIAL MARKER PLACEMENT  09/24/2021   Procedure: FIDUCIAL MARKER PLACEMENT;  Surgeon: Josephine Igo, DO;  Location: [facility];  Service: Pulmonary;;   HERNIA REPAIR     x 2   LAPAROSCOPIC NEPHRECTOMY Left  04/04/2013   Procedure: LAPAROSCOPIC LEFT RADICAL NEPHRECTOMY ;  Surgeon: Crist Fat, MD;  Location: [facility];  Service: Urology;  Laterality: Left;   LUNG BIOPSY Left 08/08/2016   Procedure: LEFT LUNG BIOPSY;  Surgeon: Delight Ovens, MD;  Location: [facility] OR;  Service: Thoracic;  Laterality: Left;   MASS EXCISION N/A 08/13/2021   Procedure: EXCISION OF SUBXIPHOID SUBCUTANEOUS  NODULE;  Surgeon: Luretha Murphy, MD;  Location: [facility];  Service: General;  Laterality: N/A;   ROBOTIC ADRENALECTOMY Left 02/17/2020   Procedure: XI ROBOTIC LEFT ADRENALECTOMY;  Surgeon: Crist Fat, MD;  Location: [facility];  Service: Urology;  Laterality: Left;  REQUESTING 4HRS   testicular removal  2000   VENTRAL HERNIA REPAIR N/A 08/13/2021   Procedure: LAPAROSCOPIC VENTRAL HERNIA WITH MESH;  Surgeon: Luretha Murphy, MD;  Location: [facility];  Service: General;  Laterality: N/A;   VIDEO BRONCHOSCOPY N/A 11/30/2012   Procedure: VIDEO BRONCHOSCOPY WITH FLUORO;  Surgeon: Storm Frisk, MD;  Location: [facility];  Service: Cardiopulmonary;  Laterality: N/A;   VIDEO BRONCHOSCOPY N/A 08/08/2016   Procedure: VIDEO BRONCHOSCOPY;  Surgeon: Delight Ovens, MD;  Location: [facility];  Service: Thoracic;  Laterality: N/A;    REVIEW OF SYSTEMS:  A comprehensive review of systems was negative except for: Respiratory: positive for dyspnea on exertion   PHYSICAL EXAMINATION: General appearance: alert, cooperative, fatigued, and no distress Head: Normocephalic, without obvious abnormality, atraumatic Neck: no adenopathy, no JVD, supple, symmetrical, trachea midline, and thyroid not enlarged, symmetric, no tenderness/mass/nodules Lymph nodes: Cervical, supraclavicular, and axillary nodes normal. Resp: clear to auscultation bilaterally Back: symmetric, no curvature. ROM normal. No CVA tenderness. Cardio: regular rate and rhythm, S1, S2 normal, no murmur, click, rub or gallop GI: soft, non-tender; bowel sounds normal; no  masses,  no organomegaly Extremities: extremities normal, atraumatic, no cyanosis or edema  ECOG PERFORMANCE STATUS: 1 - Symptomatic but completely ambulatory  Blood pressure 122/88, pulse 95, temperature 98.1 F (36.7 C), temperature source Oral, resp. rate 16, height 5\' 7"  (1.702 m), weight 164 lb 12.8 oz (74.8 kg), SpO2 96%.  LABORATORY DATA: Lab Results  Component Value Date   WBC 7.2 08/15/2022   HGB 15.8 08/15/2022   HCT 47.1 08/15/2022   MCV 90.1 08/15/2022   PLT 281 08/15/2022      Chemistry      Component Value Date/Time   NA 139 08/15/2022 1456   NA 142 06/30/2016 1406   K 5.2 (H) 08/15/2022 1456   K 4.8 06/30/2016 1406   CL 104 08/15/2022 1456   CO2 31 08/15/2022 1456   CO2 29 06/30/2016 1406   BUN 18 08/15/2022 1456   BUN 14.8 06/30/2016 1406   CREATININE 1.37 (H) 08/15/2022 1456   CREATININE 1.4 (H) 06/30/2016 1406      Component Value  Date/Time   CALCIUM 9.4 08/15/2022 1456   CALCIUM 9.8 06/30/2016 1406   ALKPHOS 101 08/15/2022 1456   ALKPHOS 122 06/30/2016 1406   AST 14 (L) 08/15/2022 1456   AST 11 06/30/2016 1406   ALT 12 08/15/2022 1456   ALT 13 06/30/2016 1406   BILITOT 0.4 08/15/2022 1456   BILITOT 0.40 06/30/2016 1406       RADIOGRAPHIC STUDIES: CT Chest Wo Contrast  Result Date: 08/20/2022 CLINICAL DATA:  Non-small-cell lung cancer. Restaging. Personal history of renal cell carcinoma and left testicular cancer. * Tracking Code: BO * EXAM: CT CHEST, ABDOMEN AND PELVIS WITHOUT CONTRAST TECHNIQUE: Multidetector CT imaging of the chest, abdomen and pelvis was performed following the standard protocol without IV contrast. RADIATION DOSE REDUCTION: This exam was performed according to the departmental dose-optimization program which includes automated exposure control, adjustment of the mA and/or kV according to patient size and/or use of iterative reconstruction technique. COMPARISON:  02/11/2022 FINDINGS: CT CHEST FINDINGS Cardiovascular: The heart  size is normal. No substantial pericardial effusion. Coronary artery calcification is evident. Moderate atherosclerotic calcification is noted in the wall of the thoracic aorta. Ascending thoracic aorta measures 3.9 cm diameter. Mediastinum/Nodes: No mediastinal lymphadenopathy. Calcified nodal tissue in the subcarinal station and right hilum is compatible with old granulomatous disease. Post treatment change in the left hilum is similar to prior. The esophagus has normal imaging features. There is no axillary lymphadenopathy. Lungs/Pleura: Pleuroparenchymal scarring in the anterior left apex tracking into this superior aspect of the left hilum is similar to prior. Band or platelike scarring in the posterior right lower lobe is new in the interval and incorporates the cystic lesion and localization clip. No new suspicious pulmonary nodule or mass. No focal airspace consolidation. No pleural effusion. Musculoskeletal: No worrisome lytic or sclerotic osseous abnormality. CT ABDOMEN PELVIS FINDINGS Hepatobiliary: No suspicious focal abnormality in the liver on this study without intravenous contrast. There is no evidence for gallstones, gallbladder wall thickening, or pericholecystic fluid. No intrahepatic or extrahepatic biliary dilation. Pancreas: No focal mass lesion. No dilatation of the main duct. No intraparenchymal cyst. No peripancreatic edema. Spleen: No splenomegaly. No suspicious focal mass lesion. Adrenals/Urinary Tract: Right adrenal gland unremarkable. Right kidney and ureter unremarkable. Left kidney and adrenal gland are surgically absent. No new or suspicious soft tissue in the left nephrectomy bed. The urinary bladder appears normal for the degree of distention. Stomach/Bowel: Stomach is unremarkable. No gastric wall thickening. No evidence of outlet obstruction. Duodenum is normally positioned as is the ligament of Treitz. No small bowel wall thickening. No small bowel dilatation. The terminal ileum  is normal. The appendix is not discretely visible, but there is no edema or inflammation in the region of the cecal tip to suggest appendicitis. No gross colonic mass. No colonic wall thickening. Vascular/Lymphatic: There is moderate atherosclerotic calcification of the abdominal aorta without aneurysm. There is no gastrohepatic or hepatoduodenal ligament lymphadenopathy. No retroperitoneal or mesenteric lymphadenopathy. No pelvic sidewall lymphadenopathy. Reproductive: Prostate gland upper normal for size. Other: No intraperitoneal free fluid. Musculoskeletal: No worrisome lytic or sclerotic osseous abnormality. IMPRESSION: 1. Stable exam. No new or progressive findings to suggest recurrent or metastatic disease in the chest, abdomen, or pelvis. 2. Post treatment scarring in the left apex tracking into the left suprahilar region left hilum is stable. 3. Interval development of band or platelike scarring in the posterior right lower lobe incorporating the cystic lesion and localization clip, also presumably post treatment related change. 4. Left nephrectomy and  adrenalectomy without evidence for local recurrence or metastatic disease in the abdomen/pelvis. 5.  Aortic Atherosclerosis (ICD10-I70.0). Electronically Signed   By: Kennith Center M.D.   On: 08/20/2022 10:23   CT Abdomen Pelvis Wo Contrast  Result Date: 08/20/2022 CLINICAL DATA:  Non-small-cell lung cancer. Restaging. Personal history of renal cell carcinoma and left testicular cancer. * Tracking Code: BO * EXAM: CT CHEST, ABDOMEN AND PELVIS WITHOUT CONTRAST TECHNIQUE: Multidetector CT imaging of the chest, abdomen and pelvis was performed following the standard protocol without IV contrast. RADIATION DOSE REDUCTION: This exam was performed according to the departmental dose-optimization program which includes automated exposure control, adjustment of the mA and/or kV according to patient size and/or use of iterative reconstruction technique. COMPARISON:   02/11/2022 FINDINGS: CT CHEST FINDINGS Cardiovascular: The heart size is normal. No substantial pericardial effusion. Coronary artery calcification is evident. Moderate atherosclerotic calcification is noted in the wall of the thoracic aorta. Ascending thoracic aorta measures 3.9 cm diameter. Mediastinum/Nodes: No mediastinal lymphadenopathy. Calcified nodal tissue in the subcarinal station and right hilum is compatible with old granulomatous disease. Post treatment change in the left hilum is similar to prior. The esophagus has normal imaging features. There is no axillary lymphadenopathy. Lungs/Pleura: Pleuroparenchymal scarring in the anterior left apex tracking into this superior aspect of the left hilum is similar to prior. Band or platelike scarring in the posterior right lower lobe is new in the interval and incorporates the cystic lesion and localization clip. No new suspicious pulmonary nodule or mass. No focal airspace consolidation. No pleural effusion. Musculoskeletal: No worrisome lytic or sclerotic osseous abnormality. CT ABDOMEN PELVIS FINDINGS Hepatobiliary: No suspicious focal abnormality in the liver on this study without intravenous contrast. There is no evidence for gallstones, gallbladder wall thickening, or pericholecystic fluid. No intrahepatic or extrahepatic biliary dilation. Pancreas: No focal mass lesion. No dilatation of the main duct. No intraparenchymal cyst. No peripancreatic edema. Spleen: No splenomegaly. No suspicious focal mass lesion. Adrenals/Urinary Tract: Right adrenal gland unremarkable. Right kidney and ureter unremarkable. Left kidney and adrenal gland are surgically absent. No new or suspicious soft tissue in the left nephrectomy bed. The urinary bladder appears normal for the degree of distention. Stomach/Bowel: Stomach is unremarkable. No gastric wall thickening. No evidence of outlet obstruction. Duodenum is normally positioned as is the ligament of Treitz. No small  bowel wall thickening. No small bowel dilatation. The terminal ileum is normal. The appendix is not discretely visible, but there is no edema or inflammation in the region of the cecal tip to suggest appendicitis. No gross colonic mass. No colonic wall thickening. Vascular/Lymphatic: There is moderate atherosclerotic calcification of the abdominal aorta without aneurysm. There is no gastrohepatic or hepatoduodenal ligament lymphadenopathy. No retroperitoneal or mesenteric lymphadenopathy. No pelvic sidewall lymphadenopathy. Reproductive: Prostate gland upper normal for size. Other: No intraperitoneal free fluid. Musculoskeletal: No worrisome lytic or sclerotic osseous abnormality. IMPRESSION: 1. Stable exam. No new or progressive findings to suggest recurrent or metastatic disease in the chest, abdomen, or pelvis. 2. Post treatment scarring in the left apex tracking into the left suprahilar region left hilum is stable. 3. Interval development of band or platelike scarring in the posterior right lower lobe incorporating the cystic lesion and localization clip, also presumably post treatment related change. 4. Left nephrectomy and adrenalectomy without evidence for local recurrence or metastatic disease in the abdomen/pelvis. 5.  Aortic Atherosclerosis (ICD10-I70.0). Electronically Signed   By: Kennith Center M.D.   On: 08/20/2022 10:23     ASSESSMENT  AND PLAN:  This is a very pleasant 72 years old white male with history of stage IIIa non-small cell lung cancer status post concurrent chemoradiation with weekly carboplatin and paclitaxel followed by observation since the patient was noted good candidate for consolidation chemotherapy at that time. He also has a history of renal cell carcinoma status post left radical nephrectomy. The patient has been in observation since January 2015. The patient has been on observation since that time and he is feeling fine with no concerning complaints. Repeat CT scan of the  chest, abdomen pelvis performed recently showed enlarging left adrenal mass but the mass has low hypermetabolic activity on the previous PET scan in September 2020. The patient has been on observation since that time and he is feeling fine with no concerning complaints. Repeat CT scan of the chest, abdomen pelvis as well as PET scan on June 26, 2021 showed interval increase in the size of the 1.7 x 1.4 cm subsolid nodule in the right lower lobe noted to be hypermetabolic on the PET scan and suspicious for metachronous primary neoplasm.  Metastatic disease not entirely excluded.  The patient also has a supple sclerotic lesion in the left aspect of the T12 vertebral body with low-level hypermetabolism on the PET scan and a 1.6 cm enhancing subcutaneous lesion in the midline epigastric anterior abdominal wall increased in size from 0.7 cm on CT of the abdomen pelvis in May 2022. The patient recently underwent a hernia repair with excision of the subxiphoid subcutaneous nodule and the final pathology was consistent with metastatic renal cell carcinoma. The patient underwent palliative radiotherapy to the right lower lobe lung lesion under the care of Dr. Kathrynn Running. The patient is currently on observation and he is feeling fine with no concerning complaints. He had repeat CT scan of the chest, abdomen and pelvis performed recently.  I personally and independently reviewed the scan and discussed the result with the patient today. His scan showed no concerning findings for disease recurrence or metastasis. I recommended for him to continue on observation with repeat CT scan of the chest, abdomen and pelvis without contrast in 6 months. Regarding the metastatic renal cell carcinoma, we will continue to monitor him closely for any disease recurrence. He was advised to call immediately if he has any other concerning symptoms in the interval.  Disclaimer: This note was dictated with voice recognition software.  Similar sounding words can inadvertently be transcribed and may not be corrected upon review.

## 2023-02-10 ENCOUNTER — Ambulatory Visit (HOSPITAL_COMMUNITY)
Admission: RE | Admit: 2023-02-10 | Discharge: 2023-02-10 | Disposition: A | Discharge status: Home / Self Care | Payer: Medicare Other | Source: Ambulatory Visit | Attending: Internal Medicine | Admitting: Internal Medicine

## 2023-02-10 ENCOUNTER — Other Ambulatory Visit: Payer: Self-pay | Admitting: Internal Medicine

## 2023-02-10 ENCOUNTER — Inpatient Hospital Stay: Payer: Medicare Other | Attending: Internal Medicine

## 2023-02-10 DIAGNOSIS — C3490 Malignant neoplasm of unspecified part of unspecified bronchus or lung: Secondary | ICD-10-CM

## 2023-02-10 DIAGNOSIS — Z87891 Personal history of nicotine dependence: Secondary | ICD-10-CM | POA: Diagnosis not present

## 2023-02-10 DIAGNOSIS — Z85118 Personal history of other malignant neoplasm of bronchus and lung: Secondary | ICD-10-CM | POA: Insufficient documentation

## 2023-02-10 DIAGNOSIS — Z9221 Personal history of antineoplastic chemotherapy: Secondary | ICD-10-CM | POA: Diagnosis not present

## 2023-02-10 DIAGNOSIS — Z9049 Acquired absence of other specified parts of digestive tract: Secondary | ICD-10-CM | POA: Insufficient documentation

## 2023-02-10 DIAGNOSIS — Z905 Acquired absence of kidney: Secondary | ICD-10-CM | POA: Insufficient documentation

## 2023-02-10 DIAGNOSIS — Z923 Personal history of irradiation: Secondary | ICD-10-CM | POA: Diagnosis not present

## 2023-02-10 LAB — CBC WITH DIFFERENTIAL (CANCER CENTER ONLY)
Abs Immature Granulocytes: 0.03 10*3/uL (ref 0.00–0.07)
Basophils Absolute: 0.1 10*3/uL (ref 0.0–0.1)
Basophils Relative: 1 %
Eosinophils Absolute: 0.1 10*3/uL (ref 0.0–0.5)
Eosinophils Relative: 2 %
HCT: 47.1 % (ref 39.0–52.0)
Hemoglobin: 15.4 g/dL (ref 13.0–17.0)
Immature Granulocytes: 0 %
Lymphocytes Relative: 15 %
Lymphs Abs: 1.3 10*3/uL (ref 0.7–4.0)
MCH: 29.3 pg (ref 26.0–34.0)
MCHC: 32.7 g/dL (ref 30.0–36.0)
MCV: 89.7 fL (ref 80.0–100.0)
Monocytes Absolute: 0.5 10*3/uL (ref 0.1–1.0)
Monocytes Relative: 6 %
Neutro Abs: 6.3 10*3/uL (ref 1.7–7.7)
Neutrophils Relative %: 76 %
Platelet Count: 294 10*3/uL (ref 150–400)
RBC: 5.25 MIL/uL (ref 4.22–5.81)
RDW: 13.4 % (ref 11.5–15.5)
WBC Count: 8.3 10*3/uL (ref 4.0–10.5)
nRBC: 0 % (ref 0.0–0.2)

## 2023-02-10 LAB — CMP (CANCER CENTER ONLY)
ALT: 12 U/L (ref 0–44)
AST: 17 U/L (ref 15–41)
Albumin: 4.1 g/dL (ref 3.5–5.0)
Alkaline Phosphatase: 93 U/L (ref 38–126)
Anion gap: 4 — ABNORMAL LOW (ref 5–15)
BUN: 14 mg/dL (ref 8–23)
CO2: 32 mmol/L (ref 22–32)
Calcium: 9.9 mg/dL (ref 8.9–10.3)
Chloride: 102 mmol/L (ref 98–111)
Creatinine: 1.38 mg/dL — ABNORMAL HIGH (ref 0.61–1.24)
GFR, Estimated: 54 mL/min — ABNORMAL LOW (ref >60)
Glucose, Bld: 98 mg/dL (ref 70–99)
Potassium: 4.9 mmol/L (ref 3.5–5.1)
Sodium: 138 mmol/L (ref 135–145)
Total Bilirubin: 0.5 mg/dL (ref 0.0–1.2)
Total Protein: 7 g/dL (ref 6.5–8.1)

## 2023-02-17 ENCOUNTER — Inpatient Hospital Stay: Payer: Medicare Other | Admitting: Internal Medicine

## 2023-02-17 VITALS — BP 125/80 | HR 91 | Temp 99.6°F | Resp 17 | Wt 168.6 lb

## 2023-02-17 DIAGNOSIS — Z85118 Personal history of other malignant neoplasm of bronchus and lung: Secondary | ICD-10-CM | POA: Diagnosis not present

## 2023-02-17 DIAGNOSIS — C3490 Malignant neoplasm of unspecified part of unspecified bronchus or lung: Secondary | ICD-10-CM

## 2023-02-17 NOTE — Progress Notes (Signed)
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  The patient had metastatic renal cell carcinoma and a subxiphoid subcutaneous nodule status post resection on 08/13/2021. 4) stage Ia (T1c, N0, M0) non-small cell lung cancer, squamous cell carcinoma presented with right lower lobe lung nodule diagnosed in September 2023.  PRIOR THERAPY:  1) Concurrent chemoradiation with weekly carboplatin for AUC of 2 and paclitaxel 45 mg/M2, status post 7 cycles, last dose was given 01/31/2013. 2) Status post left laparoscopic radical nephrectomy under the care of Dr. Marlou Porch on 04/04/2013 3) is post SBRT to the right lower lobe lung nodule under the care of Dr. Kathrynn Running. 4) palliative radiotherapy to metastatic renal cell carcinoma at the subxiphoid area under the care of Dr. Kathrynn Running  CURRENT THERAPY: Observation.  INTERVAL HISTORY: [name] 73 y.o. male returns to the clinic today for 55-month follow-up visit. Discussed the use of AI scribe software for clinical note transcription with the patient, who gave verbal consent to proceed.  History of Present Illness   [name] is a 73 year old male with stage III non-small cell lung cancer and metastatic renal cell carcinoma who presents for follow-up.  He has a history of stage III non-small cell lung cancer, squamous cell carcinoma, diagnosed in November 2014, treated with surgery. Since his last visit six months ago, he has experienced no new  symptoms such as chest pain, significant shortness of breath, or cough. Recent imaging shows stability with no growth or spread of the cancer.  He also has a history of metastatic renal cell carcinoma, initially diagnosed in November 2014. In August 2023, metastatic subxiphoid nodules were managed with radiation, followed by further treatment in September 2023. Recent imaging indicates no growth or spread of the cancer. His kidney function is slightly above normal, likely due to cancer involvement.  He recently quit smoking, stating he stopped 'right when I got out of the car' before this visit. He has been attempting to quit smoking for over eleven years.  No significant changes in his condition since his last visit. No chest pain, significant shortness of breath, cough, nausea, vomiting, diarrhea, headaches, recent weight loss, or night sweats. He mentions having 'the easiest shortness of breath' but nothing has really changed.        MEDICAL HISTORY: Past Medical History:  Diagnosis Date   Adrenal cancer, left (HCC)    Arthritis    Cancer of kidney (HCC)    left   Complication of anesthesia    Very little movement of neck- "self fusioning"   COPD (chronic obstructive pulmonary disease) (HCC)    Crohn's disease (HCC)    no issues since 2012   Difficulty sleeping    occasionally   Dyspnea    with a little exertion,  "smoked cigarettes all my life"   History of blood transfusion    "chron's flare"   Lung cancer (HCC)    Renal cell carcinoma of left kidney (HCC) 02/20/2015   Renal mass    Renal mass, left  Sleep apnea 05/2016   Testicular cancer (HCC) 2000   s/p resection, no recurrence    ALLERGIES:  is allergic to ativan [lorazepam].  MEDICATIONS:  Current Outpatient Medications  Medication Sig Dispense Refill   ASTAXANTHIN PO Take 1 capsule by mouth in the morning.     nicotine polacrilex (NICORETTE) 4 MG gum Take 4 mg by mouth as needed for smoking cessation.      Oxycodone HCl 10 MG TABS Take 1 tablet (10 mg total) by mouth 5 (five) times daily. 15 tablet 0   Probiotic Product (PROBIOTIC DAILY PO) Take 1 capsule by mouth daily. 70 Billion cfu/ 10 strains     TURMERIC CURCUMIN PO Take 1 capsule by mouth in the morning.     Vitamin D-Vitamin K (VITAMIN K2-VITAMIN D3 PO) Take 1 tablet by mouth in the morning. 125 mcg/ 180 mcg     Zinc Sulfate (ZINC 15 PO) Take 1 tablet by mouth daily. Zinc with Selenium 200 mcg     No current facility-administered medications for this visit.    SURGICAL HISTORY:  Past Surgical History:  Procedure Laterality Date   APPENDECTOMY     BRONCHIAL BIOPSY  09/24/2021   Procedure: BRONCHIAL BIOPSIES;  Surgeon: Josephine Igo, DO;  Location: [facility];  Service: Pulmonary;;   COLONOSCOPY     FIDUCIAL MARKER PLACEMENT  09/24/2021   Procedure: FIDUCIAL MARKER PLACEMENT;  Surgeon: Josephine Igo, DO;  Location: [facility];  Service: Pulmonary;;   HERNIA REPAIR     x 2   LAPAROSCOPIC NEPHRECTOMY Left 04/04/2013   Procedure: LAPAROSCOPIC LEFT RADICAL NEPHRECTOMY ;  Surgeon: Crist Fat, MD;  Location: [facility];  Service: Urology;  Laterality: Left;   LUNG BIOPSY Left 08/08/2016   Procedure: LEFT LUNG BIOPSY;  Surgeon: Delight Ovens, MD;  Location[facility] OR;  Service: Thoracic;  Laterality: Left;   MASS EXCISION N/A 08/13/2021   Procedure: EXCISION OF SUBXIPHOID SUBCUTANEOUS  NODULE;  Surgeon: Luretha Murphy, MD;  Location: [facility];  Service: General;  Laterality: N/A;   ROBOTIC ADRENALECTOMY Left 02/17/2020   Procedure: XI ROBOTIC LEFT ADRENALECTOMY;  Surgeon: Crist Fat, MD;  Location: [facility];  Service: Urology;  Laterality: Left;  REQUESTING 4HRS   testicular removal  2000   VENTRAL HERNIA REPAIR N/A 08/13/2021   Procedure: LAPAROSCOPIC VENTRAL HERNIA WITH MESH;  Surgeon: Luretha Murphy, MD;  Location: [facility];  Service: General;  Laterality: N/A;   VIDEO BRONCHOSCOPY N/A 11/30/2012   Procedure: VIDEO  BRONCHOSCOPY WITH FLUORO;  Surgeon: Storm Frisk, MD;  Location: [facility];  Service: Cardiopulmonary;  Laterality: N/A;   VIDEO BRONCHOSCOPY N/A 08/08/2016   Procedure: VIDEO BRONCHOSCOPY;  Surgeon: Delight Ovens, MD;  Location: [facility];  Service: Thoracic;  Laterality: N/A;    REVIEW OF SYSTEMS:  Constitutional: positive for fatigue Eyes: negative Ears, nose, mouth, throat, and face: negative Respiratory: positive for dyspnea on exertion Cardiovascular: negative Gastrointestinal: negative Genitourinary:negative Integument/breast: negative Hematologic/lymphatic: negative Musculoskeletal:negative Neurological: negative Behavioral/Psych: negative Endocrine: negative Allergic/Immunologic: negative   PHYSICAL EXAMINATION: General appearance: alert, cooperative, fatigued, and no distress Head: Normocephalic, without obvious abnormality, atraumatic Neck: no adenopathy, no JVD, supple, symmetrical, trachea midline, and thyroid not enlarged, symmetric, no tenderness/mass/nodules Lymph nodes: Cervical, supraclavicular, and axillary nodes normal. Resp: clear to auscultation bilaterally Back: symmetric, no curvature. ROM normal. No CVA tenderness. Cardio: regular rate and rhythm, S1, S2 normal, no murmur, click, rub or gallop GI: soft, non-tender; bowel sounds normal; no masses,  no  organomegaly Extremities: extremities normal, atraumatic, no cyanosis or edema Neurologic: Alert and oriented X 3, normal strength and tone. Normal symmetric reflexes. Normal coordination and gait  ECOG PERFORMANCE STATUS: 1 - Symptomatic but completely ambulatory  Blood pressure 125/80, pulse 91, temperature 99.6 F (37.6 C), temperature source Temporal, resp. rate 17, weight 168 lb 9.6 oz (76.5 kg), SpO2 96%.  LABORATORY DATA: Lab Results  Component Value Date   WBC 8.3 02/10/2023   HGB 15.4 02/10/2023   HCT 47.1 02/10/2023   MCV 89.7 02/10/2023   PLT 294 02/10/2023      Chemistry       Component Value Date/Time   NA 138 02/10/2023 1346   NA 142 06/30/2016 1406   K 4.9 02/10/2023 1346   K 4.8 06/30/2016 1406   CL 102 02/10/2023 1346   CO2 32 02/10/2023 1346   CO2 29 06/30/2016 1406   BUN 14 02/10/2023 1346   BUN 14.8 06/30/2016 1406   CREATININE 1.38 (H) 02/10/2023 1346   CREATININE 1.4 (H) 06/30/2016 1406      Component Value Date/Time   CALCIUM 9.9 02/10/2023 1346   CALCIUM 9.8 06/30/2016 1406   ALKPHOS 93 02/10/2023 1346   ALKPHOS 122 06/30/2016 1406   AST 17 02/10/2023 1346   AST 11 06/30/2016 1406   ALT 12 02/10/2023 1346   ALT 13 06/30/2016 1406   BILITOT 0.5 02/10/2023 1346   BILITOT 0.40 06/30/2016 1406       RADIOGRAPHIC STUDIES: CT CHEST ABDOMEN PELVIS WO CONTRAST Result Date: 02/10/2023 CLINICAL DATA:  Kidney cancer, staging. Also history of lung cancer and testicular cancer. * Tracking Code: BO * EXAM: CT CHEST, ABDOMEN AND PELVIS WITHOUT CONTRAST TECHNIQUE: Multidetector CT imaging of the chest, abdomen and pelvis was performed following the standard protocol without IV contrast. RADIATION DOSE REDUCTION: This exam was performed according to the departmental dose-optimization program which includes automated exposure control, adjustment of the mA and/or kV according to patient size and/or use of iterative reconstruction technique. COMPARISON:  Multiple priors including most recent CT August 15, 2022 FINDINGS: CT CHEST FINDINGS Cardiovascular: Aortic atherosclerosis. Normal size heart. Trace pericardial effusion. Coronary artery calcifications. Mediastinum/Nodes: No suspicious thyroid nodule. Calcified mediastinal and hilar lymph nodes. No pathologically enlarged mediastinal, hilar or axillary lymph nodes. Patulous esophagus. Lungs/Pleura: Pleuroparenchymal scarring in the anterior left lung apex tracking into the superior aspect of the left hilum is similar prior. Band or platelike scarring in the posterior right lower lobe adjacent to the localization  clips. No new suspicious pulmonary nodules or masses. Musculoskeletal: No aggressive lytic or blastic lesion of bone. Stable sclerotic focus in the T10 vertebral body. CT ABDOMEN PELVIS FINDINGS Hepatobiliary: No suspicious hepatic lesion on noncontrast enhanced examination. Gallbladder is unremarkable. No biliary ductal dilation. Pancreas: No pancreatic ductal dilation or evidence of acute inflammation. Spleen: Calcified splenic granulomata. Adrenals/Urinary Tract: Left adrenal gland is not identified and may be surgically absent. Right adrenal gland appears normal. Prior left nephrectomy without new suspicious nodularity in the surgical bed. Right kidney is unremarkable without hydronephrosis or nephrolithiasis. Urinary bladder is unremarkable for degree of distension. Stomach/Bowel: No radiopaque enteric contrast material was administered. Stomach is unremarkable for degree of distension. No pathologic dilation of small or large bowel. Vascular/Lymphatic: Aortic and branch vessel atherosclerosis. Normal caliber abdominal aorta. Smooth IVC contours. No pathologically enlarged abdominal or pelvic lymph nodes. Reproductive: Prostate is unremarkable. Surgical clips in the right hemiscrotum. Other: No significant abdominopelvic free fluid. Musculoskeletal: No aggressive lytic or blastic lesion  of bone. Diffuse demineralization of bone. Multilevel degenerative changes spine. IMPRESSION: 1. Stable examination without new or progressive findings to suggest recurrent or metastatic disease in the abdomen or pelvis. 2. Prior left nephrectomy and adrenalectomy. 3. Similar posttreatment scarring in the left lung apex tracking to the left hilum. 4. Similar band or platelike scarring in the posterior right lower lobe incorporating the cystic change and localization clip. 5. Stable sclerotic focus in the T10 vertebral body. 6.  Aortic Atherosclerosis (ICD10-I70.0). Electronically Signed   By: Maudry Mayhew M.D.   On: 02/10/2023  17:51     ASSESSMENT AND PLAN:  This is a very pleasant 73 years old white male with history of stage IIIa non-small cell lung cancer status post concurrent chemoradiation with weekly carboplatin and paclitaxel followed by observation since the patient was noted good candidate for consolidation chemotherapy at that time. He also has a history of renal cell carcinoma status post left radical nephrectomy. The patient has been in observation since January 2015. The patient has been on observation since that time and he is feeling fine with no concerning complaints. Repeat CT scan of the chest, abdomen pelvis performed recently showed enlarging left adrenal mass but the mass has low hypermetabolic activity on the previous PET scan in September 2020. The patient has been on observation since that time and he is feeling fine with no concerning complaints. Repeat CT scan of the chest, abdomen pelvis as well as PET scan on June 26, 2021 showed interval increase in the size of the 1.7 x 1.4 cm subsolid nodule in the right lower lobe noted to be hypermetabolic on the PET scan and suspicious for metachronous primary neoplasm.  Metastatic disease not entirely excluded.  The patient also has a supple sclerotic lesion in the left aspect of the T12 vertebral body with low-level hypermetabolism on the PET scan and a 1.6 cm enhancing subcutaneous lesion in the midline epigastric anterior abdominal wall increased in size from 0.7 cm on CT of the abdomen pelvis in May 2022. The patient underwent a hernia repair with excision of the subxiphoid subcutaneous nodule and the final pathology was consistent with metastatic renal cell carcinoma. The patient underwent palliative radiotherapy to the right lower lobe lung lesion under the care of Dr. Kathrynn Running. The patient is currently on observation and he is feeling fine. He had repeat CT scan of the chest, abdomen and pelvis performed recently.  I personally independently reviewed  the scan and discussed the results with the patient today.  His scan showed no concerning findings for disease progression.    Stage III Non-Small Cell Lung Cancer (NSCLC) - Squamous Cell Carcinoma Diagnosed in November 2014. Underwent surgery. No new symptoms. Recent chest, abdomen, and pelvis scan shows no growth or metastasis. Blood work normal except for slightly elevated kidney function, likely due to renal cancer. Emphasized importance of regular follow-up scans. - Follow-up scan in six months - Provide copy of CT scan results  Metastatic Renal Cell Carcinoma Diagnosed in November 2014. Managed metastatic subxiphoid lymph node with radiation in August 2023 and additional metastasis in September 2023. No new symptoms. Recent imaging shows no growth or metastasis. Blood work shows slightly elevated kidney function, likely due to renal cancer. Emphasized importance of regular follow-up scans. - Follow-up scan in six months - Monitor kidney function  General Health Maintenance Successfully quit smoking as of today. - Encourage continued smoking cessation  Follow-up - Schedule follow-up visit in six months.   The patient was  advised to call immediately if he has any concerning symptoms in the interval.   Regarding the metastatic renal cell carcinoma, we will continue to monitor him closely for any disease recurrence. He was advised to call immediately if he has any other concerning symptoms in the interval.  Disclaimer: This note was dictated with voice recognition software. Similar sounding words can inadvertently be transcribed and may not be corrected upon review.

## 2023-06-22 ENCOUNTER — Ambulatory Visit: Payer: Self-pay | Admitting: General Surgery

## 2023-07-22 NOTE — Progress Notes (Signed)
 Difficult airway flag reviewed with Dr. Paul at [facility]. Okay to proceed with surgery as scheduled.

## 2023-07-23 ENCOUNTER — Encounter (HOSPITAL_BASED_OUTPATIENT_CLINIC_OR_DEPARTMENT_OTHER): Payer: Self-pay | Admitting: General Surgery

## 2023-07-23 ENCOUNTER — Other Ambulatory Visit: Payer: Self-pay

## 2023-07-29 ENCOUNTER — Other Ambulatory Visit: Payer: Self-pay

## 2023-07-29 ENCOUNTER — Encounter (HOSPITAL_BASED_OUTPATIENT_CLINIC_OR_DEPARTMENT_OTHER)
Admission: RE | Disposition: A | Discharge status: Home / Self Care | Payer: Self-pay | Source: Home / Self Care | Attending: General Surgery

## 2023-07-29 ENCOUNTER — Ambulatory Visit (HOSPITAL_BASED_OUTPATIENT_CLINIC_OR_DEPARTMENT_OTHER)
Admission: RE | Admit: 2023-07-29 | Discharge: 2023-07-29 | Disposition: A | Discharge status: Home / Self Care | Attending: General Surgery | Admitting: General Surgery

## 2023-07-29 ENCOUNTER — Encounter (HOSPITAL_BASED_OUTPATIENT_CLINIC_OR_DEPARTMENT_OTHER): Payer: Self-pay | Admitting: General Surgery

## 2023-07-29 ENCOUNTER — Ambulatory Visit (HOSPITAL_BASED_OUTPATIENT_CLINIC_OR_DEPARTMENT_OTHER): Admitting: Anesthesiology

## 2023-07-29 DIAGNOSIS — J449 Chronic obstructive pulmonary disease, unspecified: Secondary | ICD-10-CM

## 2023-07-29 DIAGNOSIS — Z8547 Personal history of malignant neoplasm of testis: Secondary | ICD-10-CM | POA: Diagnosis not present

## 2023-07-29 DIAGNOSIS — G4733 Obstructive sleep apnea (adult) (pediatric): Secondary | ICD-10-CM | POA: Insufficient documentation

## 2023-07-29 DIAGNOSIS — C7989 Secondary malignant neoplasm of other specified sites: Secondary | ICD-10-CM | POA: Insufficient documentation

## 2023-07-29 DIAGNOSIS — R222 Localized swelling, mass and lump, trunk: Secondary | ICD-10-CM | POA: Diagnosis present

## 2023-07-29 DIAGNOSIS — F17210 Nicotine dependence, cigarettes, uncomplicated: Secondary | ICD-10-CM | POA: Diagnosis not present

## 2023-07-29 DIAGNOSIS — Z905 Acquired absence of kidney: Secondary | ICD-10-CM | POA: Diagnosis not present

## 2023-07-29 DIAGNOSIS — C649 Malignant neoplasm of unspecified kidney, except renal pelvis: Secondary | ICD-10-CM | POA: Diagnosis not present

## 2023-07-29 HISTORY — PX: EXCISION MASS ABDOMINAL: SHX6701

## 2023-07-29 SURGERY — EXCISION, MASS, TORSO
Anesthesia: Monitor Anesthesia Care | Site: Abdomen

## 2023-07-29 MED ORDER — CHLORHEXIDINE GLUCONATE CLOTH 2 % EX PADS: 6.0000 | MEDICATED_PAD | Freq: Once | CUTANEOUS | Status: DC

## 2023-07-29 MED ORDER — FENTANYL CITRATE (PF) 100 MCG/2ML IJ SOLN: 25.0000 ug | INTRAMUSCULAR | Status: DC | PRN

## 2023-07-29 MED ORDER — AMISULPRIDE (ANTIEMETIC) 5 MG/2ML IV SOLN: 10.0000 mg | Freq: Once | INTRAVENOUS | Status: DC | PRN

## 2023-07-29 MED ORDER — FENTANYL CITRATE (PF) 100 MCG/2ML IJ SOLN
INTRAMUSCULAR | Status: AC
  Filled 2023-07-29: qty 2

## 2023-07-29 MED ORDER — PHENYLEPHRINE 80 MCG/ML (10ML) SYRINGE FOR IV PUSH (FOR BLOOD PRESSURE SUPPORT)
PREFILLED_SYRINGE | INTRAVENOUS | Status: AC
  Filled 2023-07-29: qty 10

## 2023-07-29 MED ORDER — OXYCODONE HCL 5 MG PO TABS: 5.0000 mg | ORAL_TABLET | Freq: Once | ORAL | Status: DC | PRN

## 2023-07-29 MED ORDER — PROPOFOL 10 MG/ML IV BOLUS
INTRAVENOUS | Status: AC
Start: 2023-07-29 — End: 2023-07-29
  Filled 2023-07-29: qty 20

## 2023-07-29 MED ORDER — FENTANYL CITRATE (PF) 100 MCG/2ML IJ SOLN
INTRAMUSCULAR | Status: DC | PRN
  Administered 2023-07-29: 50 ug via INTRAVENOUS

## 2023-07-29 MED ORDER — LIDOCAINE 2% (20 MG/ML) 5 ML SYRINGE
INTRAMUSCULAR | Status: AC
  Filled 2023-07-29: qty 5

## 2023-07-29 MED ORDER — OXYCODONE HCL 5 MG/5ML PO SOLN: 5.0000 mg | Freq: Once | ORAL | Status: DC | PRN

## 2023-07-29 MED ORDER — LIDOCAINE 2% (20 MG/ML) 5 ML SYRINGE
INTRAMUSCULAR | Status: DC | PRN
  Administered 2023-07-29: 60 mg via INTRAVENOUS

## 2023-07-29 MED ORDER — PHENYLEPHRINE HCL (PRESSORS) 10 MG/ML IV SOLN
INTRAVENOUS | Status: DC | PRN
  Administered 2023-07-29 (×3): 120 ug via INTRAVENOUS

## 2023-07-29 MED ORDER — ONDANSETRON HCL 4 MG/2ML IJ SOLN: 4.0000 mg | Freq: Once | INTRAMUSCULAR | Status: DC | PRN

## 2023-07-29 MED ORDER — ACETAMINOPHEN 500 MG PO TABS
1000.0000 mg | ORAL_TABLET | Freq: Once | ORAL | Status: AC
  Administered 2023-07-29: 1000 mg via ORAL

## 2023-07-29 MED ORDER — LIDOCAINE HCL (PF) 1 % IJ SOLN
INTRAMUSCULAR | Status: DC | PRN
  Administered 2023-07-29: 10 mL

## 2023-07-29 MED ORDER — BUPIVACAINE-EPINEPHRINE 0.25% -1:200000 IJ SOLN
INTRAMUSCULAR | Status: DC | PRN
  Administered 2023-07-29: 10 mL

## 2023-07-29 MED ORDER — PROPOFOL 10 MG/ML IV BOLUS
INTRAVENOUS | Status: DC | PRN
  Administered 2023-07-29: 200 ug/kg/min via INTRAVENOUS
  Administered 2023-07-29: 30 mg via INTRAVENOUS

## 2023-07-29 MED ORDER — LACTATED RINGERS IV SOLN: INTRAVENOUS | Status: DC

## 2023-07-29 MED ORDER — ONDANSETRON HCL 4 MG/2ML IJ SOLN
INTRAMUSCULAR | Status: AC
  Filled 2023-07-29: qty 2

## 2023-07-29 MED ORDER — ACETAMINOPHEN 500 MG PO TABS
ORAL_TABLET | ORAL | Status: AC
  Filled 2023-07-29: qty 2

## 2023-07-29 SURGICAL SUPPLY — 37 items
BLADE SURG 10 STRL SS (BLADE) ×1 IMPLANT
BLADE SURG 15 STRL LF DISP TIS (BLADE) ×1 IMPLANT
CANISTER SUCT 1200ML W/VALVE (MISCELLANEOUS) IMPLANT
CHLORAPREP W/TINT 26 (MISCELLANEOUS) ×1 IMPLANT
COVER BACK TABLE 60X90IN (DRAPES) ×1 IMPLANT
COVER MAYO STAND STRL (DRAPES) ×1 IMPLANT
DERMABOND ADVANCED .7 DNX12 (GAUZE/BANDAGES/DRESSINGS) ×1 IMPLANT
DRAPE LAPAROTOMY 100X72 PEDS (DRAPES) ×1 IMPLANT
DRAPE UTILITY XL STRL (DRAPES) ×1 IMPLANT
ELECT COATED BLADE 2.86 ST (ELECTRODE) ×1 IMPLANT
ELECTRODE REM PT RTRN 9FT ADLT (ELECTROSURGICAL) ×1 IMPLANT
GAUZE 4X4 16PLY ~~LOC~~+RFID DBL (SPONGE) IMPLANT
GLOVE BIO SURGEON STRL SZ7.5 (GLOVE) ×1 IMPLANT
GOWN STRL REUS W/ TWL LRG LVL3 (GOWN DISPOSABLE) ×2 IMPLANT
NDL HYPO 25X1 1.5 SAFETY (NEEDLE) IMPLANT
NEEDLE HYPO 25X1 1.5 SAFETY (NEEDLE) ×1 IMPLANT
NS IRRIG 1000ML POUR BTL (IV SOLUTION) ×1 IMPLANT
PACK BASIN DAY SURGERY FS (CUSTOM PROCEDURE TRAY) ×1 IMPLANT
PENCIL SMOKE EVACUATOR (MISCELLANEOUS) ×1 IMPLANT
SLEEVE SCD COMPRESS KNEE MED (STOCKING) ×1 IMPLANT
SPIKE FLUID TRANSFER (MISCELLANEOUS) IMPLANT
SPONGE T-LAP 18X18 ~~LOC~~+RFID (SPONGE) ×1 IMPLANT
SUT CHROMIC 3 0 SH 27 (SUTURE) IMPLANT
SUT ETHILON 3 0 PS 1 (SUTURE) IMPLANT
SUT MON AB 4-0 PC3 18 (SUTURE) IMPLANT
SUT PROLENE 3 0 PS 2 (SUTURE) IMPLANT
SUT SILK 2 0 PERMA HAND 18 BK (SUTURE) IMPLANT
SUT VIC AB 3-0 54X BRD REEL (SUTURE) IMPLANT
SUT VIC AB 3-0 FS2 27 (SUTURE) IMPLANT
SUT VIC AB 3-0 SH 27X BRD (SUTURE) IMPLANT
SUT VIC AB 4-0 PS2 18 (SUTURE) IMPLANT
SUT VIC AB 4-0 RB1 27X BRD (SUTURE) IMPLANT
SUT VICRYL AB 3 0 TIES (SUTURE) IMPLANT
SYR CONTROL 10ML LL (SYRINGE) IMPLANT
TOWEL GREEN STERILE FF (TOWEL DISPOSABLE) ×2 IMPLANT
TUBE CONNECTING 20X1/4 (TUBING) IMPLANT
YANKAUER SUCT BULB TIP NO VENT (SUCTIONS) IMPLANT

## 2023-07-29 NOTE — Op Note (Signed)
 07/29/2023  1:26 PM  PATIENT:  [name]  73 y.o. male  PRE-OPERATIVE DIAGNOSIS:  ABDOMINAL WALL MASS  POST-OPERATIVE DIAGNOSIS:  ABDOMINAL WALL MASS  PROCEDURE:  Procedure(s) with comment:  EXCISION OF ABD WALL MASS, 2cm  SURGEON:  Surgeons and Role:    * Curvin Deward MOULD, MD - Primary  PHYSICIAN ASSISTANT:   ASSISTANTS: none   ANESTHESIA:   local and IV sedation  EBL:  3 mL   BLOOD ADMINISTERED:none  DRAINS: none   LOCAL MEDICATIONS USED:  MARCAINE     and LIDOCAINE    SPECIMEN:  Source of Specimen:  abdominal wall mass  DISPOSITION OF SPECIMEN:  PATHOLOGY  COUNTS:  YES  TOURNIQUET:  * No tourniquets in log *  DICTATION: .Dragon Dictation  After informed consent was obtained the patient was brought to the operating room and placed in the supine position on the operating table.  After adequate IV sedation had been given the patient's abdomen was prepped with ChloraPrep, allowed to dry, and draped in usual sterile manner.  An appropriate timeout was performed.  The area around the palpable mass in the epigastric region was infiltrated with a combination of 1% lidocaine  and quarter percent Marcaine  with epinephrine .  An elliptical incision was then made with a 15 blade knife around the mass.  The incision was carried through the skin and subcutaneous tissue sharply with the electrocautery until the entire mass was removed.  The dissection was carried all the way to the fascia of the abdominal wall.  The tissue removed was marked with a short stitch on the superior surface and a long stitch on the lateral right surface.  The tissue was then sent to pathology for further evaluation.  Hemostasis was achieved using the Bovie electrocautery.  The deep layer of the incision was closed with interrupted 3-0 Vicryl stitches.  The skin was then closed with a running 4-0 Monocryl subcuticular stitch.  Dermabond dressings were applied.  The patient tolerated the procedure well.  At the  end of the case all needle sponge and instrument counts were correct.  The patient was then awakened and taken to recovery in stable condition.  The mass measured approximately 2 cm in diameter.  PLAN OF CARE: Discharge to home after PACU  PATIENT DISPOSITION:  PACU - hemodynamically stable.   Delay start of Pharmacological VTE agent (>24hrs) due to surgical blood loss or risk of bleeding: not applicable

## 2023-07-29 NOTE — Anesthesia Preprocedure Evaluation (Addendum)
 Anesthesia Evaluation  Patient identified by MRN, date of birth, ID band Patient awake    Reviewed: Allergy & Precautions, NPO status , Patient's Chart, lab work & pertinent test results  History of Anesthesia Complications Negative for: history of anesthetic complications  Airway Mallampati: III  TM Distance: >3 FB Neck ROM: Limited   Comment: ankylosed Dental  (+) Dental Advisory Given   Pulmonary sleep apnea , COPD, former smoker   breath sounds clear to auscultation       Cardiovascular negative cardio ROS  Rhythm:Regular Rate:Normal     Neuro/Psych negative neurological ROS  negative psych ROS   GI/Hepatic negative GI ROS, Neg liver ROS,,,Crohn's   Endo/Other  negative endocrine ROS    Renal/GU Renal diseaseHx RCC s/p nephrectomy 2015  negative genitourinary   Musculoskeletal  (+) Arthritis , Osteoarthritis,  Chronic pain: oxy 10mg    Abdominal   Peds  Hematology negative hematology ROS (+)   Anesthesia Other Findings   Reproductive/Obstetrics negative OB ROS H/o testicular cancer                              Anesthesia Physical Anesthesia Plan  ASA: 3  Anesthesia Plan: MAC   Post-op Pain Management: Tylenol  PO (pre-op)*   Induction: Intravenous  PONV Risk Score and Plan: 2 and Propofol  infusion, TIVA and Treatment may vary due to age or medical condition  Airway Management Planned: Natural Airway and Simple Face Mask  Additional Equipment: None  Intra-op Plan:   Post-operative Plan:   Informed Consent: I have reviewed the patients History and Physical, chart, labs and discussed the procedure including the risks, benefits and alternatives for the proposed anesthesia with the patient or authorized representative who has indicated his/her understanding and acceptance.     Dental advisory given  Plan Discussed with: CRNA and Surgeon  Anesthesia Plan Comments:           Anesthesia Quick Evaluation

## 2023-07-29 NOTE — Interval H&P Note (Signed)
 History and Physical Interval Note:  07/29/2023 12:47 PM  [name]  has presented today for surgery, with the diagnosis of ABDOMINAL WALL MASS.  The various methods of treatment have been discussed with the patient and family. After consideration of risks, benefits and other options for treatment, the patient has consented to  Procedure(s) with comments: EXCISION, MASS, TORSO (N/A) - EXCISION OF ABD WALL MASS as a surgical intervention.  The patient's history has been reviewed, patient examined, no change in status, stable for surgery.  I have reviewed the patient's chart and labs.  Questions were answered to the patient's satisfaction.     Deward Null III

## 2023-07-29 NOTE — Discharge Instructions (Addendum)
 No tylenol  before 6:30 pm.   Post Anesthesia Home Care Instructions  Activity: Get plenty of rest for the remainder of the day. A responsible individual must stay with you for 24 hours following the procedure.  For the next 24 hours, DO NOT: -Drive a car -Advertising copywriter -Drink alcoholic beverages -Take any medication unless instructed by your physician -Make any legal decisions or sign important papers.  Meals: Start with liquid foods such as gelatin or soup. Progress to regular foods as tolerated. Avoid greasy, spicy, heavy foods. If nausea and/or vomiting occur, drink only clear liquids until the nausea and/or vomiting subsides. Call your physician if vomiting continues.  Special Instructions/Symptoms: Your throat may feel dry or sore from the anesthesia or the breathing tube placed in your throat during surgery. If this causes discomfort, gargle with warm salt water . The discomfort should disappear within 24 hours.  If you had a scopolamine  patch placed behind your ear for the management of post- operative nausea and/or vomiting:  1. The medication in the patch is effective for 72 hours, after which it should be removed.  Wrap patch in a tissue and discard in the trash. Wash hands thoroughly with soap and water . 2. You may remove the patch earlier than 72 hours if you experience unpleasant side effects which may include dry mouth, dizziness or visual disturbances. 3. Avoid touching the patch. Wash your hands with soap and water  after contact with the patch.

## 2023-07-29 NOTE — Transfer of Care (Signed)
 Immediate Anesthesia Transfer of Care Note  Patient: [name]  Procedure(s) Performed: EXCISION, MASS, TORSO (Abdomen)  Patient Location: PACU  Anesthesia Type:MAC  Level of Consciousness: drowsy  Airway & Oxygen Therapy: Patient Spontanous Breathing and Patient connected to face mask oxygen  Post-op Assessment: Report given to RN and Post -op Vital signs reviewed and stable  Post vital signs: Reviewed and stable  Last Vitals:  Vitals Value Taken Time  BP 89/55 07/29/23 13:34  Temp    Pulse 67 07/29/23 13:35  Resp 14 07/29/23 13:35  SpO2 97 % 07/29/23 13:35  Vitals shown include unfiled device data.  Last Pain:  Vitals:   07/29/23 1227  TempSrc: Temporal  PainSc: 0-No pain      Patients Stated Pain Goal: 4 (07/29/23 1227)  Complications: No notable events documented.

## 2023-07-29 NOTE — Anesthesia Postprocedure Evaluation (Signed)
 Anesthesia Post Note  Patient: [name]  Procedure(s) Performed: EXCISION, MASS, TORSO (Abdomen)     Patient location during evaluation: PACU Anesthesia Type: MAC Level of consciousness: awake and alert Pain management: pain level controlled Vital Signs Assessment: post-procedure vital signs reviewed and stable Respiratory status: spontaneous breathing, nonlabored ventilation and respiratory function stable Cardiovascular status: blood pressure returned to baseline and stable Postop Assessment: no apparent nausea or vomiting Anesthetic complications: no   No notable events documented.  Last Vitals:  Vitals:   07/29/23 1400 07/29/23 1431  BP: 111/61 116/69  Pulse: 69 64  Resp: 16 18  Temp:  (!) 36.2 C  SpO2: 94% 95%    Last Pain:  Vitals:   07/29/23 1431  TempSrc:   PainSc: 0-No pain                 Butler Levander Pinal

## 2023-07-29 NOTE — H&P (Signed)
 MRN: [id number] DOB: 08-Jun-1950 Subjective   Chief Complaint: New Problem (Rucurrent skin nodule)   History of Present Illness: [name]. [name] is a 73 y.o. male who is seen today for abdominal wall mass. The patient is a 73 year old white male who has a history of lung cancer and metastatic renal cell cancer who had a metastatic focus of renal cell carcinoma removed from his epigastric region a couple years ago. Over the last year he has noticed a recurrence of the mass. He denies any pain. He does continue to smoke.    Review of Systems: A complete review of systems was obtained from the patient. I have reviewed this information and discussed as appropriate with the patient. See HPI as well for other ROS.  ROS   Medical History: Past Medical History:  Diagnosis Date  Chronic kidney disease  COPD (chronic obstructive pulmonary disease) (CMS/HHS-HCC)  History of cancer   Patient Active Problem List  Diagnosis  Renal mass, left  Cancer of left lung parenchyma (CMS/HHS-HCC)  COPD with chronic bronchitis (CMS/HHS-HCC)  Dyspnea  Goals of care, counseling/discussion  Obstructive sleep apnea  Renal cell carcinoma of left kidney (CMS/HHS-HCC)  Tobacco use disorder  Lung nodule  Abdominal wall mass   Past Surgical History:  Procedure Laterality Date  APPENDECTOMY  HERNIA REPAIR  NEPHRECTOMY Left    Allergies  Allergen Reactions  Lorazepam  Other (See Comments)  Wife states  he became severally confused and combative   Current Outpatient Medications on File Prior to Visit  Medication Sig Dispense Refill  astaxanthin 4 mg Cap Take by mouth  cholecalciferol, vitamin D3, (VITAMIN D3) 125 mcg (5,000 unit) tablet Take 5,000 Units by mouth once daily  Lactobac no.41/Bifidobact no.7 (PROBIOTIC-10 ORAL) Take by mouth  multivitamin/zinc sulfate/sel (MULTIVITAMIN-ZINC-SELENIUM ORAL) Take by mouth  oxyCODONE  (DAZIDOX) 10 mg immediate release tablet TAKE ONE TABLET BY MOUTH FIVE  TIMES DAILY AS NEEDED  turmeric (CURCUMIN MISC) Use   No current facility-administered medications on file prior to visit.   History reviewed. No pertinent family history.   Social History   Tobacco Use  Smoking Status Every Day  Current packs/day: 0.50  Types: Cigarettes  Smokeless Tobacco Never    Social History   Socioeconomic History  Marital status: Married  Tobacco Use  Smoking status: Every Day  Current packs/day: 0.50  Types: Cigarettes  Smokeless tobacco: Never  Vaping Use  Vaping status: Never Used  Substance and Sexual Activity  Alcohol use: Not Currently  Drug use: Never   Social Drivers of Health   Housing Stability: Unknown (06/22/2023)  Housing Stability Vital Sign  Homeless in the Last Year: No   Objective:   Vitals:  BP: 137/79  Pulse: 91  Temp: 37 C (98.6 F)  SpO2: 97%  Weight: 74.8 kg (164 lb 12.8 oz)  Height: 170.2 cm (5' 7)  PainSc: 0-No pain   Body mass index is 25.81 kg/m.  Physical Exam Constitutional:  General: He is not in acute distress. Appearance: Normal appearance.  HENT:  Head: Normocephalic and atraumatic.  Right Ear: External ear normal.  Left Ear: External ear normal.  Nose: Nose normal.  Mouth/Throat:  Mouth: Mucous membranes are moist.  Pharynx: Oropharynx is clear.   Eyes:  General: No scleral icterus. Extraocular Movements: Extraocular movements intact.  Conjunctiva/sclera: Conjunctivae normal.  Pupils: Pupils are equal, round, and reactive to light.   Cardiovascular:  Rate and Rhythm: Normal rate and regular rhythm.  Pulses: Normal pulses.  Heart sounds: Normal heart sounds.  Pulmonary:  Effort: Pulmonary effort is normal. No respiratory distress.  Breath sounds: Normal breath sounds.  Abdominal:  General: Abdomen is flat. Bowel sounds are normal. There is no distension.  Palpations: Abdomen is soft.  Tenderness: There is no abdominal tenderness.  Comments: There is a 2 cm mass along the  epigastric transversely oriented incision   Musculoskeletal:  General: No swelling or deformity. Normal range of motion.  Cervical back: Rigidity present. No tenderness.   Skin: General: Skin is warm and dry.  Coloration: Skin is not jaundiced.   Neurological:  General: No focal deficit present.  Mental Status: He is alert and oriented to person, place, and time.   Psychiatric:  Mood and Affect: Mood normal.  Behavior: Behavior normal.     Labs, Imaging and Diagnostic Testing:  Assessment and Plan:   Diagnoses and all orders for this visit:  Abdominal wall mass - [facility] Case Posting Request; Future    The patient appears to have a recurrent mass in the epigastric region of his abdominal wall. This is most likely a recurrence of his metastatic renal cell cancer. It appears to be isolated to the skin and subcutaneous tissue at the moment. He would like to have this removed and I feel like this is a reasonable thing. I have discussed with him in detail the risks and benefits of the operation as well as some of the technical aspects and he understands and wishes to proceed. Will try to do this under just local and possibly some IV sedation. He would be a difficult intubation candidate given his neck auto fusing. We will move forward with surgical scheduling and then have him follow-up with his oncologist

## 2023-07-30 ENCOUNTER — Encounter (HOSPITAL_BASED_OUTPATIENT_CLINIC_OR_DEPARTMENT_OTHER): Payer: Self-pay | Admitting: General Surgery

## 2023-08-03 LAB — SURGICAL PATHOLOGY

## 2023-08-04 ENCOUNTER — Ambulatory Visit: Payer: Self-pay | Admitting: General Surgery

## 2023-08-10 ENCOUNTER — Ambulatory Visit (HOSPITAL_COMMUNITY)
Admission: RE | Admit: 2023-08-10 | Discharge: 2023-08-10 | Disposition: A | Discharge status: Home / Self Care | Source: Ambulatory Visit | Attending: Internal Medicine | Admitting: Internal Medicine

## 2023-08-10 ENCOUNTER — Inpatient Hospital Stay: Payer: Medicare Other | Attending: Internal Medicine

## 2023-08-10 DIAGNOSIS — Z9049 Acquired absence of other specified parts of digestive tract: Secondary | ICD-10-CM | POA: Insufficient documentation

## 2023-08-10 DIAGNOSIS — C3490 Malignant neoplasm of unspecified part of unspecified bronchus or lung: Secondary | ICD-10-CM | POA: Diagnosis present

## 2023-08-10 DIAGNOSIS — Z905 Acquired absence of kidney: Secondary | ICD-10-CM | POA: Diagnosis not present

## 2023-08-10 DIAGNOSIS — Z923 Personal history of irradiation: Secondary | ICD-10-CM | POA: Insufficient documentation

## 2023-08-10 DIAGNOSIS — Z9221 Personal history of antineoplastic chemotherapy: Secondary | ICD-10-CM | POA: Insufficient documentation

## 2023-08-10 DIAGNOSIS — Z85118 Personal history of other malignant neoplasm of bronchus and lung: Secondary | ICD-10-CM | POA: Insufficient documentation

## 2023-08-10 DIAGNOSIS — Z87891 Personal history of nicotine dependence: Secondary | ICD-10-CM | POA: Insufficient documentation

## 2023-08-10 LAB — CMP (CANCER CENTER ONLY)
ALT: 10 U/L (ref 0–44)
AST: 13 U/L — ABNORMAL LOW (ref 15–41)
Albumin: 3.8 g/dL (ref 3.5–5.0)
Alkaline Phosphatase: 89 U/L (ref 38–126)
Anion gap: 4 — ABNORMAL LOW (ref 5–15)
BUN: 14 mg/dL (ref 8–23)
CO2: 32 mmol/L (ref 22–32)
Calcium: 9.5 mg/dL (ref 8.9–10.3)
Chloride: 100 mmol/L (ref 98–111)
Creatinine: 1.27 mg/dL — ABNORMAL HIGH (ref 0.61–1.24)
GFR, Estimated: 59 mL/min — ABNORMAL LOW (ref >60)
Glucose, Bld: 95 mg/dL (ref 70–99)
Potassium: 4.5 mmol/L (ref 3.5–5.1)
Sodium: 136 mmol/L (ref 135–145)
Total Bilirubin: 0.4 mg/dL (ref 0.0–1.2)
Total Protein: 6.9 g/dL (ref 6.5–8.1)

## 2023-08-10 LAB — CBC WITH DIFFERENTIAL (CANCER CENTER ONLY)
Abs Immature Granulocytes: 0.03 K/uL (ref 0.00–0.07)
Basophils Absolute: 0 K/uL (ref 0.0–0.1)
Basophils Relative: 1 %
Eosinophils Absolute: 0.2 K/uL (ref 0.0–0.5)
Eosinophils Relative: 2 %
HCT: 45.9 % (ref 39.0–52.0)
Hemoglobin: 15 g/dL (ref 13.0–17.0)
Immature Granulocytes: 0 %
Lymphocytes Relative: 19 %
Lymphs Abs: 1.3 K/uL (ref 0.7–4.0)
MCH: 29.1 pg (ref 26.0–34.0)
MCHC: 32.7 g/dL (ref 30.0–36.0)
MCV: 89 fL (ref 80.0–100.0)
Monocytes Absolute: 0.4 K/uL (ref 0.1–1.0)
Monocytes Relative: 6 %
Neutro Abs: 5 K/uL (ref 1.7–7.7)
Neutrophils Relative %: 72 %
Platelet Count: 263 K/uL (ref 150–400)
RBC: 5.16 MIL/uL (ref 4.22–5.81)
RDW: 13.7 % (ref 11.5–15.5)
WBC Count: 6.9 K/uL (ref 4.0–10.5)
nRBC: 0 % (ref 0.0–0.2)

## 2023-08-17 ENCOUNTER — Inpatient Hospital Stay: Payer: Medicare Other | Admitting: Internal Medicine

## 2023-08-17 VITALS — BP 125/70 | HR 84 | Temp 97.2°F | Resp 17 | Ht 67.0 in | Wt 162.0 lb

## 2023-08-17 DIAGNOSIS — C3490 Malignant neoplasm of unspecified part of unspecified bronchus or lung: Secondary | ICD-10-CM

## 2023-08-17 DIAGNOSIS — Z85118 Personal history of other malignant neoplasm of bronchus and lung: Secondary | ICD-10-CM | POA: Diagnosis not present

## 2023-08-17 NOTE — Progress Notes (Signed)
 [facility] [phone]   [phone]  OFFICE PROGRESS NOTE  [place] [place] 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  The patient had metastatic renal cell carcinoma and a subxiphoid subcutaneous nodule status post resection on 08/13/2021. 4) stage Ia (T1c, N0, M0) non-small cell lung cancer, squamous cell carcinoma presented with right lower lobe lung nodule diagnosed in September 2023.  PRIOR THERAPY:  1) Concurrent chemoradiation with weekly carboplatin  for AUC of 2 and paclitaxel  45 mg/M2, status post 7 cycles, last dose was given 01/31/2013. 2) Status post left laparoscopic radical nephrectomy under the care of Dr. Cam on 04/04/2013 3) is post SBRT to the right lower lobe lung nodule under the care of Dr. Patrcia. 4) palliative radiotherapy to metastatic renal cell carcinoma at the subxiphoid area under the care of Dr. Patrcia 5) abdominal soft tissue mass excision on July 29, 2023 and was consistent with metastatic clear-cell renal cell carcinoma.  The tumor measured 1.5 cm in greatest dimension with negative resection margin.  CURRENT THERAPY: Observation.  INTERVAL HISTORY: [name] 73 y.o. male returns to the clinic today for 68-month follow-up visit.Discussed the use of AI scribe software for clinical note transcription with the patient, who gave verbal consent to proceed.  History of Present Illness [name] is a 73 year old male with stage 3A non-small cell lung cancer and metastatic renal cell carcinoma who presents for evaluation and  discussion of recent CT scan results.  He has a history of stage 3A non-small cell lung cancer, squamous cell carcinoma, diagnosed in November 2014, for which he underwent concurrent chemoradiation. A recent CT scan of the chest, abdomen, and pelvis was performed.  He also has a history of metastatic renal cell carcinoma, initially treated with laparoscopic nephrectomy in March 2015. On July 29, 2023, he underwent excision of an abdominal soft tissue mass in the epigastric area, which was consistent with metastatic renal cell carcinoma. A recent CT scan of the chest, abdomen, and pelvis was performed to evaluate his condition.  He continues to smoke and experiences occasional tiredness. He sleeps three to four hours at night, supplemented by afternoon naps, and desires to achieve five to six hours of continuous sleep.      MEDICAL HISTORY: Past Medical History:  Diagnosis Date   Adrenal cancer, left (HCC)    Arthritis    Cancer of kidney (HCC)    left   Complication of anesthesia    Very little movement of neck- self fusioning   COPD (chronic obstructive pulmonary disease) (HCC)    Crohn's disease (HCC)    no issues since 2012   Difficulty sleeping    occasionally   Dyspnea    with a little exertion,  smoked cigarettes all my life   History of blood transfusion    chron's flare   Lung cancer (HCC)    Renal cell carcinoma of left kidney (HCC) 02/20/2015   Renal mass    Renal mass, left    Sleep apnea 05/2016   Testicular cancer (HCC) 2000   s/p resection, no recurrence    ALLERGIES:  is allergic to ativan  [lorazepam ].  MEDICATIONS:  Current Outpatient Medications  Medication Sig Dispense Refill   ASTAXANTHIN PO Take 1 capsule by mouth in the morning.     nicotine  polacrilex (NICORETTE) 4 MG gum Take 4 mg by mouth as needed for smoking cessation.     Oxycodone  HCl 10 MG TABS Take 1 tablet (10 mg total) by mouth 5 (five) times daily. 15 tablet 0   Probiotic Product  (PROBIOTIC DAILY PO) Take 1 capsule by mouth daily. 70 Billion cfu/ 10 strains     TURMERIC CURCUMIN PO Take 1 capsule by mouth in the morning.     Vitamin D-Vitamin K (VITAMIN K2-VITAMIN D3 PO) Take 1 tablet by mouth in the morning. 125 mcg/ 180 mcg     Zinc Sulfate (ZINC 15 PO) Take 1 tablet by mouth daily. Zinc with Selenium 200 mcg     No current facility-administered medications for this visit.    SURGICAL HISTORY:  Past Surgical History:  Procedure Laterality Date   APPENDECTOMY     BRONCHIAL BIOPSY  09/24/2021   Procedure: BRONCHIAL BIOPSIES;  Surgeon: Brenna Adine CROME, DO;  Location: [facility];  Service: Pulmonary;;   COLONOSCOPY     EXCISION MASS ABDOMINAL N/A 07/29/2023   Procedure: EXCISION, MASS, TORSO;  Surgeon: Curvin Deward MOULD, MD;  Location: [facility];  Service: General;  Laterality: N/A;  EXCISION OF ABD WALL MASS   FIDUCIAL MARKER PLACEMENT  09/24/2021   Procedure: FIDUCIAL MARKER PLACEMENT;  Surgeon: Brenna Adine CROME, DO;  Location: [facility];  Service: Pulmonary;;   HERNIA REPAIR     x 2   LAPAROSCOPIC NEPHRECTOMY Left 04/04/2013   Procedure: LAPAROSCOPIC LEFT RADICAL NEPHRECTOMY ;  Surgeon: Morene LELON Salines, MD;  Location: [facility];  Service: Urology;  Laterality: Left;   LUNG BIOPSY Left 08/08/2016   Procedure: LEFT LUNG BIOPSY;  Surgeon: Army Dallas NOVAK, MD;  Location: [facility];  Service: Thoracic;  Laterality: Left;   MASS EXCISION N/A 08/13/2021   Procedure: EXCISION OF SUBXIPHOID SUBCUTANEOUS  NODULE;  Surgeon: Gladis Cough, MD;  Location: [facility];  Service: General;  Laterality: N/A;   ROBOTIC ADRENALECTOMY Left 02/17/2020   Procedure: XI ROBOTIC LEFT ADRENALECTOMY;  Surgeon: Salines Morene LELON, MD;  Location: [facility];  Service: Urology;  Laterality: Left;  REQUESTING 4HRS   testicular removal  2000   VENTRAL HERNIA REPAIR N/A 08/13/2021   Procedure: LAPAROSCOPIC VENTRAL HERNIA WITH MESH;  Surgeon: Gladis Cough, MD;  Location: [facility];  Service:  General;  Laterality: N/A;   VIDEO BRONCHOSCOPY N/A 11/30/2012   Procedure: VIDEO BRONCHOSCOPY WITH FLUORO;  Surgeon: Belvie FORBES Silvan, MD;  Location: [facility];  Service: Cardiopulmonary;  Laterality: N/A;   VIDEO BRONCHOSCOPY N/A 08/08/2016   Procedure: VIDEO BRONCHOSCOPY;  Surgeon: Army Dallas NOVAK, MD;  Location: [facility];  Service: Thoracic;  Laterality: N/A;    REVIEW OF SYSTEMS:  Constitutional: positive for fatigue Eyes: negative Ears, nose, mouth, throat, and face: negative Respiratory: positive for dyspnea on exertion Cardiovascular: negative Gastrointestinal: negative Genitourinary:negative Integument/breast: negative Hematologic/lymphatic: negative Musculoskeletal:negative Neurological: negative Behavioral/Psych: negative Endocrine: negative Allergic/Immunologic: negative   PHYSICAL EXAMINATION: General appearance: alert, cooperative, fatigued, and no distress Head: Normocephalic, without obvious abnormality, atraumatic Neck: no adenopathy, no JVD, supple, symmetrical, trachea midline, and thyroid not enlarged, symmetric, no tenderness/mass/nodules Lymph nodes: Cervical, supraclavicular, and axillary nodes normal. Resp: clear to auscultation bilaterally Back: symmetric, no curvature. ROM normal. No CVA tenderness. Cardio: regular rate and rhythm, S1, S2  normal, no murmur, click, rub or gallop GI: soft, non-tender; bowel sounds normal; no masses,  no organomegaly Extremities: extremities normal, atraumatic, no cyanosis or edema Neurologic: Alert and oriented X 3, normal strength and tone. Normal symmetric reflexes. Normal coordination and gait  ECOG PERFORMANCE STATUS: 1 - Symptomatic but completely ambulatory  Blood pressure 125/70, pulse 84, temperature (!) 97.2 F (36.2 C), resp. rate 17, height 5' 7 (1.702 m), weight 162 lb (73.5 kg), SpO2 97%.  LABORATORY DATA: Lab Results  Component Value Date   WBC 6.9 08/10/2023   HGB 15.0 08/10/2023   HCT 45.9  08/10/2023   MCV 89.0 08/10/2023   PLT 263 08/10/2023      Chemistry      Component Value Date/Time   NA 136 08/10/2023 1318   NA 142 06/30/2016 1406   K 4.5 08/10/2023 1318   K 4.8 06/30/2016 1406   CL 100 08/10/2023 1318   CO2 32 08/10/2023 1318   CO2 29 06/30/2016 1406   BUN 14 08/10/2023 1318   BUN 14.8 06/30/2016 1406   CREATININE 1.27 (H) 08/10/2023 1318   CREATININE 1.4 (H) 06/30/2016 1406      Component Value Date/Time   CALCIUM 9.5 08/10/2023 1318   CALCIUM 9.8 06/30/2016 1406   ALKPHOS 89 08/10/2023 1318   ALKPHOS 122 06/30/2016 1406   AST 13 (L) 08/10/2023 1318   AST 11 06/30/2016 1406   ALT 10 08/10/2023 1318   ALT 13 06/30/2016 1406   BILITOT 0.4 08/10/2023 1318   BILITOT 0.40 06/30/2016 1406       RADIOGRAPHIC STUDIES: CT Chest Wo Contrast Result Date: 08/14/2023 CLINICAL DATA:  Non-small-cell lung cancer restaging, additional history of metastatic renal cell carcinoma * Tracking Code: BO * EXAM: CT CHEST, ABDOMEN AND PELVIS WITHOUT CONTRAST TECHNIQUE: Multidetector CT imaging of the chest, abdomen and pelvis was performed following the standard protocol without IV contrast. RADIATION DOSE REDUCTION: This exam was performed according to the departmental dose-optimization program which includes automated exposure control, adjustment of the mA and/or kV according to patient size and/or use of iterative reconstruction technique. COMPARISON:  02/10/2023 FINDINGS: CT CHEST FINDINGS Cardiovascular: Aortic atherosclerosis. Normal heart size. Left coronary artery calcifications. No pericardial effusion. Mediastinum/Nodes: No enlarged mediastinal, hilar, or axillary lymph nodes. Densely calcified, benign granulomatous subcarinal and right hilar lymph nodes. Thyroid gland, trachea, and esophagus demonstrate no significant findings. Lungs/Pleura: Unchanged post treatment appearance of the left chest with dense perihilar and suprahilar left upper lobe radiation fibrosis,  consolidation, and volume loss. Unchanged bandlike scarring and ground-glass in the right lower lobe adjacent to a biopsy marking clip (series 4, image 70). New clustered, irregular nodularity in the peripheral right upper lobe (series 4, image 63). Dominant nodular components measure 1.1 x 0.7 cm anteriorly and 1.0 x 0.9 cm posteriorly (series 4, image 63). No pleural effusion or pneumothorax. Musculoskeletal: No chest wall abnormality. No acute osseous findings. Disc degenerative disease and bridging osteophytosis throughout the thoracic and upper lumbar spine, in keeping with DISH. CT ABDOMEN PELVIS FINDINGS Hepatobiliary: No solid liver abnormality is seen. No gallstones, gallbladder wall thickening, or biliary dilatation. Pancreas: Unremarkable. No pancreatic ductal dilatation or surrounding inflammatory changes. Spleen: Normal in size without significant abnormality. Adrenals/Urinary Tract: Normal right adrenal. Possible left adrenalectomy. Left nephrectomy. No suspicious soft tissue in the nephrectomy bed. Right kidney is normal, without renal calculi, solid lesion, or hydronephrosis. Bladder is unremarkable. Stomach/Bowel: Stomach is within normal limits. Appendectomy. No evidence of bowel wall thickening, distention, or  inflammatory changes. Vascular/Lymphatic: Aortic atherosclerosis. No enlarged abdominal or pelvic lymph nodes. Reproductive: No mass or other abnormality. Other: Midline ventral hernia mesh repair.  No ascites. Musculoskeletal: No acute osseous findings. IMPRESSION: 1. Unchanged post treatment appearance of the left chest with dense perihilar and suprahilar left upper lobe radiation fibrosis, consolidation, and volume loss. 2. Unchanged bandlike scarring and ground-glass in the right lower lobe adjacent to a biopsy marking clip. 3. New clustered, irregular nodularity in the peripheral right upper lobe. Dominant nodular components measure 1.1 x 0.7 cm anteriorly and 1.0 x 0.9 cm posteriorly.  Given appearance this is most likely infectious or inflammatory, however warrants attention on short-term follow-up to exclude metachronous malignancy or metastasis. 4. No noncontrast evidence of lymphadenopathy or metastatic disease in the abdomen or pelvis. 5. Left nephrectomy. No suspicious soft tissue in the nephrectomy bed. 6. Coronary artery disease. Aortic Atherosclerosis (ICD10-I70.0). Electronically Signed   By: Marolyn JONETTA Jaksch M.D.   On: 08/14/2023 17:42   CT ABDOMEN PELVIS WO CONTRAST Result Date: 08/14/2023 CLINICAL DATA:  Non-small-cell lung cancer restaging, additional history of metastatic renal cell carcinoma * Tracking Code: BO * EXAM: CT CHEST, ABDOMEN AND PELVIS WITHOUT CONTRAST TECHNIQUE: Multidetector CT imaging of the chest, abdomen and pelvis was performed following the standard protocol without IV contrast. RADIATION DOSE REDUCTION: This exam was performed according to the departmental dose-optimization program which includes automated exposure control, adjustment of the mA and/or kV according to patient size and/or use of iterative reconstruction technique. COMPARISON:  02/10/2023 FINDINGS: CT CHEST FINDINGS Cardiovascular: Aortic atherosclerosis. Normal heart size. Left coronary artery calcifications. No pericardial effusion. Mediastinum/Nodes: No enlarged mediastinal, hilar, or axillary lymph nodes. Densely calcified, benign granulomatous subcarinal and right hilar lymph nodes. Thyroid gland, trachea, and esophagus demonstrate no significant findings. Lungs/Pleura: Unchanged post treatment appearance of the left chest with dense perihilar and suprahilar left upper lobe radiation fibrosis, consolidation, and volume loss. Unchanged bandlike scarring and ground-glass in the right lower lobe adjacent to a biopsy marking clip (series 4, image 70). New clustered, irregular nodularity in the peripheral right upper lobe (series 4, image 63). Dominant nodular components measure 1.1 x 0.7 cm  anteriorly and 1.0 x 0.9 cm posteriorly (series 4, image 63). No pleural effusion or pneumothorax. Musculoskeletal: No chest wall abnormality. No acute osseous findings. Disc degenerative disease and bridging osteophytosis throughout the thoracic and upper lumbar spine, in keeping with DISH. CT ABDOMEN PELVIS FINDINGS Hepatobiliary: No solid liver abnormality is seen. No gallstones, gallbladder wall thickening, or biliary dilatation. Pancreas: Unremarkable. No pancreatic ductal dilatation or surrounding inflammatory changes. Spleen: Normal in size without significant abnormality. Adrenals/Urinary Tract: Normal right adrenal. Possible left adrenalectomy. Left nephrectomy. No suspicious soft tissue in the nephrectomy bed. Right kidney is normal, without renal calculi, solid lesion, or hydronephrosis. Bladder is unremarkable. Stomach/Bowel: Stomach is within normal limits. Appendectomy. No evidence of bowel wall thickening, distention, or inflammatory changes. Vascular/Lymphatic: Aortic atherosclerosis. No enlarged abdominal or pelvic lymph nodes. Reproductive: No mass or other abnormality. Other: Midline ventral hernia mesh repair.  No ascites. Musculoskeletal: No acute osseous findings. IMPRESSION: 1. Unchanged post treatment appearance of the left chest with dense perihilar and suprahilar left upper lobe radiation fibrosis, consolidation, and volume loss. 2. Unchanged bandlike scarring and ground-glass in the right lower lobe adjacent to a biopsy marking clip. 3. New clustered, irregular nodularity in the peripheral right upper lobe. Dominant nodular components measure 1.1 x 0.7 cm anteriorly and 1.0 x 0.9 cm posteriorly. Given appearance this is most  likely infectious or inflammatory, however warrants attention on short-term follow-up to exclude metachronous malignancy or metastasis. 4. No noncontrast evidence of lymphadenopathy or metastatic disease in the abdomen or pelvis. 5. Left nephrectomy. No suspicious soft  tissue in the nephrectomy bed. 6. Coronary artery disease. Aortic Atherosclerosis (ICD10-I70.0). Electronically Signed   By: Marolyn JONETTA Jaksch M.D.   On: 08/14/2023 17:42     ASSESSMENT AND PLAN:  This is a very pleasant 73 years old white male with history of stage IIIa non-small cell lung cancer status post concurrent chemoradiation with weekly carboplatin  and paclitaxel  followed by observation since the patient was noted good candidate for consolidation chemotherapy at that time. He also has a history of renal cell carcinoma status post left radical nephrectomy. The patient has been in observation since January 2015. The patient has been on observation since that time and he is feeling fine with no concerning complaints. Repeat CT scan of the chest, abdomen pelvis performed recently showed enlarging left adrenal mass but the mass has low hypermetabolic activity on the previous PET scan in September 2020. The patient has been on observation since that time and he is feeling fine with no concerning complaints. Repeat CT scan of the chest, abdomen pelvis as well as PET scan on June 26, 2021 showed interval increase in the size of the 1.7 x 1.4 cm subsolid nodule in the right lower lobe noted to be hypermetabolic on the PET scan and suspicious for metachronous primary neoplasm.  Metastatic disease not entirely excluded.  The patient also has a supple sclerotic lesion in the left aspect of the T12 vertebral body with low-level hypermetabolism on the PET scan and a 1.6 cm enhancing subcutaneous lesion in the midline epigastric anterior abdominal wall increased in size from 0.7 cm on CT of the abdomen pelvis in May 2022. The patient underwent a hernia repair with excision of the subxiphoid subcutaneous nodule and the final pathology was consistent with metastatic renal cell carcinoma. The patient underwent palliative radiotherapy to the right lower lobe lung lesion under the care of Dr. Patrcia. The patient is  currently on observation and he is feeling fine. He had repeat CT scan of the chest, abdomen and pelvis performed recently.  I personally independently reviewed the scan images and discussed the result with the patient today.  His scan showed no concerning findings for disease progression except for nodular opacity in the right upper lobe suspicious for inflammatory process but metastasis could not be completely excluded. Assessment and Plan Assessment & Plan Metastatic renal cell carcinoma Metastatic renal cell carcinoma with recent excision of abdominal soft tissue mass on July 29, 2023, consistent with metastatic disease. The cancer continues to spread, with potential for further metastases. Immune therapy is considered as a treatment option, which is a newer drug effective for kidney cancer. The treatment involves infusions every three weeks for up to two years if effective. Shared decision-making involved considering immune therapy versus continued observation. - Discuss immune therapy as a treatment option for metastatic renal cell carcinoma - Arrange for follow-up in three months with repeat CT scan of chest, abdomen, and pelvis  History of stage 3A non-small cell lung cancer, squamous cell carcinoma Recent CT scan shows nodules in the right lung, which could represent inflammation or potential cancer recurrence. The nodules are more suggestive of inflammation, but cancer cannot be ruled out entirely. - Arrange for follow-up in three months with repeat CT scan of chest, abdomen, and pelvis  Tobacco use disorder Ongoing  tobacco use disorder with continued smoking. He acknowledges the need to quit smoking and is considering cessation options. - Discuss smoking cessation strategies, including gum and patches The patient was advised to call immediately if he has any concerning symptoms in the interval.  Regarding the metastatic renal cell carcinoma, we will continue to monitor him closely for  any disease recurrence. He was advised to call immediately if he has any other concerning symptoms in the interval. The total time spent in the appointment was 30 minutes including review of chart and various tests results, discussions about plan of care and coordination of care plan .  Disclaimer: This note was dictated with voice recognition software. Similar sounding words can inadvertently be transcribed and may not be corrected upon review.

## 2023-08-18 ENCOUNTER — Telehealth: Payer: Self-pay | Admitting: Internal Medicine

## 2023-08-18 NOTE — Telephone Encounter (Signed)
 Left the patient a voicemail with the scheduled appointment details.

## 2023-08-19 ENCOUNTER — Telehealth: Payer: Self-pay | Admitting: Internal Medicine

## 2023-08-19 NOTE — Telephone Encounter (Signed)
 Left the patient a voicemail with the rescheduled appointment details per patients request via voicemail.

## 2023-08-24 ENCOUNTER — Encounter: Payer: Self-pay | Admitting: Podiatry

## 2023-08-24 ENCOUNTER — Ambulatory Visit (INDEPENDENT_AMBULATORY_CARE_PROVIDER_SITE_OTHER)

## 2023-08-24 ENCOUNTER — Ambulatory Visit (INDEPENDENT_AMBULATORY_CARE_PROVIDER_SITE_OTHER): Admitting: Podiatry

## 2023-08-24 ENCOUNTER — Telehealth: Payer: Self-pay | Admitting: Internal Medicine

## 2023-08-24 VITALS — Ht 67.0 in | Wt 162.0 lb

## 2023-08-24 DIAGNOSIS — R600 Localized edema: Secondary | ICD-10-CM

## 2023-08-24 DIAGNOSIS — M7752 Other enthesopathy of left foot: Secondary | ICD-10-CM

## 2023-08-24 NOTE — Telephone Encounter (Signed)
 The patient left a voicemail to NOT leave his wife a voicemail with the scheduled/ rescheduled appointment details- that is on file in the [facility] as his home phone number.

## 2023-08-24 NOTE — Progress Notes (Signed)
 Chief Complaint  Patient presents with   Foot Pain    Pt is here due to left foot pain, he states he has had swelling to the left foot for 6-8 months no injury to foot, has not treated it prior to this visit, right foot is visually swollen he is not concerned about it.    HPI: 73 y.o. 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   Renal mass    Renal mass, left    Sleep apnea 05/2016   Testicular cancer (HCC) 2000   s/p resection, no recurrence    Past Surgical History:  Procedure Laterality Date   APPENDECTOMY     BRONCHIAL BIOPSY  09/24/2021   Procedure: BRONCHIAL BIOPSIES;  Surgeon: Brenna Adine CROME, DO;  Location: [facility];  Service: Pulmonary;;   COLONOSCOPY     EXCISION MASS ABDOMINAL N/A 07/29/2023   Procedure: EXCISION, MASS, TORSO;  Surgeon: Curvin Deward MOULD, MD;  Location: [facility];  Service: General;  Laterality: N/A;  EXCISION OF ABD WALL MASS   FIDUCIAL MARKER PLACEMENT  09/24/2021   Procedure: FIDUCIAL MARKER PLACEMENT;  Surgeon: Brenna Adine CROME, DO;  Location: [facility];  Service: Pulmonary;;   HERNIA REPAIR     x 2   LAPAROSCOPIC NEPHRECTOMY Left 04/04/2013   Procedure: LAPAROSCOPIC LEFT RADICAL NEPHRECTOMY ;  Surgeon: Morene LELON Salines, MD;  Location: [facility];  Service: Urology;  Laterality: Left;   LUNG BIOPSY Left 08/08/2016   Procedure: LEFT LUNG BIOPSY;  Surgeon: Army Dallas NOVAK, MD;  Location: [facility];   Service: Thoracic;  Laterality: Left;   MASS EXCISION N/A 08/13/2021   Procedure: EXCISION OF SUBXIPHOID SUBCUTANEOUS  NODULE;  Surgeon: Gladis Cough, MD;  Location: [facility];  Service: General;  Laterality: N/A;   ROBOTIC ADRENALECTOMY Left 02/17/2020   Procedure: XI ROBOTIC LEFT ADRENALECTOMY;  Surgeon: Salines Morene LELON, MD;  Location: [facility];  Service: Urology;  Laterality: Left;  REQUESTING 4HRS   testicular removal  2000   VENTRAL HERNIA REPAIR N/A 08/13/2021   Procedure: LAPAROSCOPIC VENTRAL HERNIA WITH MESH;  Surgeon: Gladis Cough, MD;  Location: [facility];  Service: General;  Laterality: N/A;   VIDEO BRONCHOSCOPY N/A 11/30/2012   Procedure: VIDEO BRONCHOSCOPY WITH FLUORO;  Surgeon: Belvie FORBES Silvan, MD;  Location: [facility];  Service: Cardiopulmonary;  Laterality: N/A;   VIDEO BRONCHOSCOPY N/A 08/08/2016   Procedure: VIDEO BRONCHOSCOPY;  Surgeon: Army Dallas NOVAK, MD;  Location: [facility] OR;  Service: Thoracic;  Laterality: N/A;    Allergies  Allergen Reactions   Ativan  [Lorazepam ] Other (See Comments)    Wife states  he became severally confused and combative      Physical Exam: General: The patient is alert and oriented x3 in no acute distress.  Dermatology: Skin is warm, dry and supple bilateral lower extremities.  Preulcerative callus lesion noted plantar aspect of the fifth MTP bilateral  Vascular: Palpable pedal pulses bilaterally. Capillary refill within normal limits.  Bilateral chronic lower extremity edema noted  Neurological: Grossly intact via light touch  Musculoskeletal Exam: No pedal deformities noted  Radiographic Exam LT foot 08/24/2023:  Normal osseous mineralization. Joint spaces preserved.  No fractures or osseous irregularities noted.  Impression: Negative  Assessment/Plan of Care: 1.  Chronic bilateral lower extremity edema 2.  Symptomatic preulcerative callus lesion fifth MTP bilateral  -Patient evaluated.  X-rays reviewed -Excisional debridement of  the preulcerative callus was performed today using a 312 scalpel without incident or bleeding.  Patient felt significant relief -Advised against going barefoot.  Recommend good supportive shoes or sneakers even around the house -Recommend  knee-high Jobst compression -Return to clinic PRN       Thresa EMERSON Sar, DPM [facility]  Dr. Thresa EMERSON Sar, DPM    [address] [place], [place] [address]                Office [phone]  Fax [phone]

## 2023-11-09 ENCOUNTER — Other Ambulatory Visit

## 2023-11-09 ENCOUNTER — Inpatient Hospital Stay: Attending: Internal Medicine

## 2023-11-09 ENCOUNTER — Ambulatory Visit (HOSPITAL_COMMUNITY)
Admission: RE | Admit: 2023-11-09 | Discharge: 2023-11-09 | Disposition: A | Discharge status: Home / Self Care | Source: Ambulatory Visit | Attending: Internal Medicine | Admitting: Internal Medicine

## 2023-11-09 DIAGNOSIS — C3490 Malignant neoplasm of unspecified part of unspecified bronchus or lung: Secondary | ICD-10-CM | POA: Insufficient documentation

## 2023-11-09 DIAGNOSIS — Z85118 Personal history of other malignant neoplasm of bronchus and lung: Secondary | ICD-10-CM | POA: Insufficient documentation

## 2023-11-09 DIAGNOSIS — Z9221 Personal history of antineoplastic chemotherapy: Secondary | ICD-10-CM | POA: Insufficient documentation

## 2023-11-09 DIAGNOSIS — Z923 Personal history of irradiation: Secondary | ICD-10-CM | POA: Insufficient documentation

## 2023-11-09 LAB — CBC WITH DIFFERENTIAL (CANCER CENTER ONLY)
Abs Immature Granulocytes: 0.02 K/uL (ref 0.00–0.07)
Basophils Absolute: 0 K/uL (ref 0.0–0.1)
Basophils Relative: 0 %
Eosinophils Absolute: 0.2 K/uL (ref 0.0–0.5)
Eosinophils Relative: 2 %
HCT: 45 % (ref 39.0–52.0)
Hemoglobin: 14.7 g/dL (ref 13.0–17.0)
Immature Granulocytes: 0 %
Lymphocytes Relative: 19 %
Lymphs Abs: 1.5 K/uL (ref 0.7–4.0)
MCH: 29.2 pg (ref 26.0–34.0)
MCHC: 32.7 g/dL (ref 30.0–36.0)
MCV: 89.3 fL (ref 80.0–100.0)
Monocytes Absolute: 0.6 K/uL (ref 0.1–1.0)
Monocytes Relative: 7 %
Neutro Abs: 5.7 K/uL (ref 1.7–7.7)
Neutrophils Relative %: 72 %
Platelet Count: 258 K/uL (ref 150–400)
RBC: 5.04 MIL/uL (ref 4.22–5.81)
RDW: 14 % (ref 11.5–15.5)
WBC Count: 7.9 K/uL (ref 4.0–10.5)
nRBC: 0 % (ref 0.0–0.2)

## 2023-11-09 LAB — CMP (CANCER CENTER ONLY)
ALT: 10 U/L (ref 0–44)
AST: 16 U/L (ref 15–41)
Albumin: 3.8 g/dL (ref 3.5–5.0)
Alkaline Phosphatase: 86 U/L (ref 38–126)
Anion gap: 3 — ABNORMAL LOW (ref 5–15)
BUN: 19 mg/dL (ref 8–23)
CO2: 32 mmol/L (ref 22–32)
Calcium: 9.6 mg/dL (ref 8.9–10.3)
Chloride: 103 mmol/L (ref 98–111)
Creatinine: 1.31 mg/dL — ABNORMAL HIGH (ref 0.61–1.24)
GFR, Estimated: 57 mL/min — ABNORMAL LOW (ref >60)
Glucose, Bld: 100 mg/dL — ABNORMAL HIGH (ref 70–99)
Potassium: 4.8 mmol/L (ref 3.5–5.1)
Sodium: 138 mmol/L (ref 135–145)
Total Bilirubin: 0.3 mg/dL (ref 0.0–1.2)
Total Protein: 6.7 g/dL (ref 6.5–8.1)

## 2023-11-16 ENCOUNTER — Ambulatory Visit: Admitting: Internal Medicine

## 2023-11-17 ENCOUNTER — Inpatient Hospital Stay: Admitting: Internal Medicine

## 2023-11-17 VITALS — BP 129/75 | HR 114 | Temp 98.2°F | Resp 20 | Wt 154.7 lb

## 2023-11-17 DIAGNOSIS — C3490 Malignant neoplasm of unspecified part of unspecified bronchus or lung: Secondary | ICD-10-CM | POA: Diagnosis not present

## 2023-11-17 NOTE — Progress Notes (Signed)
 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  The patient had metastatic renal cell carcinoma and a subxiphoid subcutaneous nodule status post resection on 08/13/2021. 4) stage Ia (T1c, N0, M0) non-small cell lung cancer, squamous cell carcinoma presented with right lower lobe lung nodule diagnosed in September 2023.  PRIOR THERAPY:  1) Concurrent chemoradiation with weekly carboplatin  for AUC of 2 and paclitaxel  45 mg/M2, status post 7 cycles, last dose was given 01/31/2013. 2) Status post left laparoscopic radical nephrectomy under the care of Dr. Cam on 04/04/2013 3) is post SBRT to the right lower lobe lung nodule under the care of Dr. Patrcia. 4) palliative radiotherapy to metastatic renal cell carcinoma at the subxiphoid area under the care of Dr. Patrcia 5) abdominal soft tissue mass excision on July 29, 2023 and was consistent with metastatic clear-cell renal cell carcinoma.  The tumor measured 1.5 cm in greatest dimension with negative resection margin.  CURRENT THERAPY: Observation.  INTERVAL HISTORY: [name] 73 y.o. male returns to the clinic today for 15-month follow-up visit.Discussed the use of AI scribe software for clinical note transcription with the patient, who gave verbal consent to proceed.  History of Present Illness [name] is a 73 year old male with stage IIIA non-small cell lung cancer and metastatic renal cell carcinoma who presents for evaluation  and repeat CT scan for restaging of his disease.  He has a history of stage IIIA non-small cell lung cancer diagnosed in November 2014, treated with concurrent chemoradiation. In September 2023, he was diagnosed with recurrent non-small cell lung cancer, presenting as stage IA squamous cell carcinoma of the right lower lobe.  He also has a history of metastatic renal cell carcinoma diagnosed in November 2014, treated with left laparoscopic radical nephrectomy and palliative radiotherapy to bone metastases. In July 2025, he underwent abdominal soft wall mass excision, which confirmed metastatic clear cell renal cell carcinoma. No new symptoms reported.  No new symptoms, including no chest pain, breathing issues, nausea, vomiting, or weight loss. He maintains his weight within the usual range. He has a concern about a previous surgical site, noting a 'phobia' about it, but reports no significant changes.    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  MEDICATIONS:  Current Outpatient Medications  Medication Sig Dispense Refill   ASTAXANTHIN PO Take 1 capsule by mouth in the morning.     nicotine  polacrilex (NICORETTE) 4 MG gum Take 4 mg by mouth as needed for smoking cessation.     Oxycodone  HCl 10 MG TABS Take 1  tablet (10 mg total) by mouth 5 (five) times daily. 15 tablet 0   Probiotic Product (PROBIOTIC DAILY PO) Take 1 capsule by mouth daily. 70 Billion cfu/ 10 strains     TURMERIC CURCUMIN PO Take 1 capsule by mouth in the morning.     Vitamin D-Vitamin K (VITAMIN K2-VITAMIN D3 PO) Take 1 tablet by mouth in the morning. 125 mcg/ 180 mcg     Zinc Sulfate (ZINC 15 PO) Take 1 tablet by mouth daily. Zinc with Selenium 200 mcg     No current facility-administered medications for this visit.    SURGICAL HISTORY:  Past Surgical History:  Procedure Laterality Date   APPENDECTOMY     BRONCHIAL BIOPSY  09/24/2021   Procedure: BRONCHIAL BIOPSIES;  Surgeon: Brenna Adine CROME, DO;  Location: [facility];  Service: Pulmonary;;   COLONOSCOPY     EXCISION MASS ABDOMINAL N/A 07/29/2023   Procedure: EXCISION, MASS, TORSO;  Surgeon: Curvin Deward MOULD, MD;  Location: [facility];  Service: General;  Laterality: N/A;  EXCISION OF ABD WALL MASS   FIDUCIAL MARKER PLACEMENT  09/24/2021   Procedure: FIDUCIAL MARKER PLACEMENT;  Surgeon: Brenna Adine CROME, DO;  Location: [facility];  Service: Pulmonary;;   HERNIA REPAIR     x 2   LAPAROSCOPIC NEPHRECTOMY Left 04/04/2013   Procedure: LAPAROSCOPIC LEFT RADICAL NEPHRECTOMY ;  Surgeon: Morene LELON Salines, MD;  Location: [facility];  Service: Urology;  Laterality: Left;   LUNG BIOPSY Left 08/08/2016   Procedure: LEFT LUNG BIOPSY;  Surgeon: Army Dallas NOVAK, MD;  [facility] OR;  Service: Thoracic;  Laterality: Left;   MASS EXCISION N/A 08/13/2021   Procedure: EXCISION OF SUBXIPHOID SUBCUTANEOUS  NODULE;  Surgeon: Gladis Cough, MD;  Location: [facility];  Service: General;  Laterality: N/A;   ROBOTIC ADRENALECTOMY Left 02/17/2020   Procedure: XI ROBOTIC LEFT ADRENALECTOMY;  Surgeon: Salines Morene LELON, MD;  Location: [facility];  Service: Urology;  Laterality: Left;  REQUESTING 4HRS   testicular removal  2000   VENTRAL HERNIA REPAIR N/A 08/13/2021   Procedure: LAPAROSCOPIC  VENTRAL HERNIA WITH MESH;  Surgeon: Gladis Cough, MD;  Location: [facility];  Service: General;  Laterality: N/A;   VIDEO BRONCHOSCOPY N/A 11/30/2012   Procedure: VIDEO BRONCHOSCOPY WITH FLUORO;  Surgeon: Belvie FORBES Silvan, MD;  Location: [facility];  Service: Cardiopulmonary;  Laterality: N/A;   VIDEO BRONCHOSCOPY N/A 08/08/2016   Procedure: VIDEO BRONCHOSCOPY;  Surgeon: Army Dallas NOVAK, MD;  Location: [facility];  Service: Thoracic;  Laterality: N/A;    REVIEW OF SYSTEMS:  Constitutional: positive for fatigue Eyes: negative Ears, nose, mouth, throat, and face: negative Respiratory: positive for dyspnea on exertion Cardiovascular: negative Gastrointestinal: negative Genitourinary:negative Integument/breast: negative Hematologic/lymphatic: negative Musculoskeletal:negative Neurological: negative Behavioral/Psych: negative Endocrine: negative Allergic/Immunologic: negative   PHYSICAL EXAMINATION: General appearance: alert, cooperative, fatigued, and no distress Head: Normocephalic, without obvious abnormality, atraumatic Neck: no adenopathy, no JVD, supple, symmetrical, trachea midline, and thyroid not enlarged, symmetric, no tenderness/mass/nodules Lymph nodes: Cervical, supraclavicular, and axillary nodes normal. Resp: clear to auscultation bilaterally Back: symmetric, no curvature. ROM normal. No CVA tenderness.  Cardio: regular rate and rhythm, S1, S2 normal, no murmur, click, rub or gallop GI: soft, non-tender; bowel sounds normal; no masses,  no organomegaly Extremities: extremities normal, atraumatic, no cyanosis or edema Neurologic: Alert and oriented X 3, normal strength and tone. Normal symmetric reflexes. Normal coordination and gait  ECOG PERFORMANCE STATUS: 1 - Symptomatic but completely ambulatory  Blood pressure 129/75, pulse (!) 114, temperature 98.2 F (36.8 C), temperature source Oral, resp. rate 20, SpO2 97%.  LABORATORY DATA: Lab Results  Component Value Date    WBC 7.9 11/09/2023   HGB 14.7 11/09/2023   HCT 45.0 11/09/2023   MCV 89.3 11/09/2023   PLT 258 11/09/2023      Chemistry      Component Value Date/Time   NA 138 11/09/2023 1415   NA 142 06/30/2016 1406   K 4.8 11/09/2023 1415   K 4.8 06/30/2016 1406   CL 103 11/09/2023 1415   CO2 32 11/09/2023 1415   CO2 29 06/30/2016 1406   BUN 19 11/09/2023 1415   BUN 14.8 06/30/2016 1406   CREATININE 1.31 (H) 11/09/2023 1415   CREATININE 1.4 (H) 06/30/2016 1406      Component Value Date/Time   CALCIUM 9.6 11/09/2023 1415   CALCIUM 9.8 06/30/2016 1406   ALKPHOS 86 11/09/2023 1415   ALKPHOS 122 06/30/2016 1406   AST 16 11/09/2023 1415   AST 11 06/30/2016 1406   ALT 10 11/09/2023 1415   ALT 13 06/30/2016 1406   BILITOT 0.3 11/09/2023 1415   BILITOT 0.40 06/30/2016 1406       RADIOGRAPHIC STUDIES: CT CHEST ABDOMEN PELVIS WO CONTRAST Result Date: 11/15/2023 EXAM: CT CHEST, ABDOMEN AND PELVIS WITHOUT CONTRAST 11/09/2023 03:38:36 PM TECHNIQUE: CT of the chest, abdomen and pelvis was performed without the administration of intravenous contrast. Multiplanar reformatted images are provided for review. Automated exposure control, iterative reconstruction, and/or weight based adjustment of the mA/kV was utilized to reduce the radiation dose to as low as reasonably achievable. COMPARISON: CT Abdomen Pelvis without Contrast 08/10/2023. 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 FINDINGS: CHEST: MEDIASTINUM AND LYMPH NODES: The heart is normal in size. No pericardial effusion. The aorta is within normal limits in caliber. Stable atherosclerotic calcifications. Stable coronary artery calcifications. No mediastinal or hilar mass or adenopathy. Small scattered lymph nodes are stable. Stable dense calcification in the subcarinal area and in the right hilum. The esophagus is unremarkable. The thyroid gland is normal.  LUNGS AND PLEURA: Stable dense area of radiation fibrosis involving the left upper lobe and left hilar region. No new worrisome findings to suggest recurrent tumor. Stable post-treatment changes surrounding the fiducials in the right lower lobe with interstitial thickening and linear scarring. No mass is identified. Improved appearance of the patching nodularity in the right upper lobe peripherally. No significant residual solid nodularity but persistent linear scarring-type changes. This is likely postinfectious scarring. No new pulmonary nodules or pulmonary lesions. No pleural effusions or pleural nodules. No acute pulmonary process. ABDOMEN AND PELVIS: LIVER: No hepatic lesions are identified without contrast. GALLBLADDER AND BILE DUCTS: Gallbladder is unremarkable. No biliary ductal dilatation. SPLEEN: The spleen is normal in size. Small calcified granulomas. PANCREAS: No pancreatic lesions or inflammation. ADRENAL GLANDS: The adrenal glands are unremarkable. KIDNEYS, URETERS AND BLADDER: The left kidney is surgically absent. No right renal lesion, renal calculi, or hydronephrosis. The bladder is normal. GI AND BOWEL: The stomach, duodenum, small bowel, and  colon are grossly normal. No acute inflammatory process or obstructive findings. REPRODUCTIVE ORGANS: The prostate gland and seminal vesicles are unremarkable. PERITONEUM AND RETROPERITONEUM: No ascites. No free air. VASCULATURE: Stable age-advanced atherosclerotic calcifications involving the aorta, iliac arteries, and branch vessels. ABDOMINAL AND PELVIS LYMPH NODES: No mesenteric or retroperitoneal or pelvic adenopathy. BONES AND SOFT TISSUES: The bony structures are intact. No findings suspicious for osseous metastatic disease. Stable advanced degenerative changes involving the spine and hips. Surgical changes noted from prior anterior abdominal wall hernia repair with mesh. No recurrent hernia. IMPRESSION: 1. Stable post-treatment changes in the left  upper lobe and left hilar region, with no new findings to suggest recurrent tumor. 2. Improved appearance of patchy nodularity in the right upper lobe, likely postinfectious scarring. 3. No new pulmonary nodules or pulmonary lesions. 4. No acute pulmonary process. 5. No evidence of metastatic disease. Electronically signed by: Maude Stammer MD 11/15/2023 10:11 AM EST RP Workstation: [id number]     ASSESSMENT AND PLAN:  This is a very pleasant 73 years old white male with history of stage IIIa non-small cell lung cancer status post concurrent chemoradiation with weekly carboplatin  and paclitaxel  followed by observation since the patient was noted good candidate for consolidation chemotherapy at that time. He also has a history of renal cell carcinoma status post left radical nephrectomy. The patient has been in observation since January 2015. The patient has been on observation since that time and he is feeling fine with no concerning complaints. Repeat CT scan of the chest, abdomen pelvis performed recently showed enlarging left adrenal mass but the mass has low hypermetabolic activity on the previous PET scan in September 2020. The patient has been on observation since that time and he is feeling fine with no concerning complaints. Repeat CT scan of the chest, abdomen pelvis as well as PET scan on June 26, 2021 showed interval increase in the size of the 1.7 x 1.4 cm subsolid nodule in the right lower lobe noted to be hypermetabolic on the PET scan and suspicious for metachronous primary neoplasm.  Metastatic disease not entirely excluded.  The patient also has a supple sclerotic lesion in the left aspect of the T12 vertebral body with low-level hypermetabolism on the PET scan and a 1.6 cm enhancing subcutaneous lesion in the midline epigastric anterior abdominal wall increased in size from 0.7 cm on CT of the abdomen pelvis in May 2022. The patient underwent a hernia repair with excision of the  subxiphoid subcutaneous nodule and the final pathology was consistent with metastatic renal cell carcinoma. The patient underwent palliative radiotherapy to the right lower lobe lung lesion under the care of Dr. Patrcia. The patient is currently on observation and he is feeling fine. He had repeat CT scan of the chest, abdomen and pelvis performed recently.  I personally independently reviewed the scan and discussed the result with the patient today. His scan showed no concerning findings for disease progression. Assessment and Plan Assessment & Plan Surveillance for recurrent non-small cell lung cancer of the right lower lobe Recurrent non-small cell lung cancer, previously stage 1A squamous cell carcinoma of the right lower lobe, diagnosed in September 2023. Currently under observation with no new symptoms such as chest pain, dyspnea, nausea, vomiting, or weight loss. Recent CT scan of the chest, abdomen, and pelvis shows no evidence of disease progression. Previous obesity noted on scan was due to inflammation, which has resolved. - Continue observation with CT scan every six months for disease surveillance.  History of metastatic renal cell carcinoma, status post nephrectomy and radiotherapy Metastatic renal cell carcinoma treated with left laparoscopic radical nephrectomy and palliative radiotherapy. No new symptoms or evidence of disease progression on recent imaging.  Postoperative status following abdominal wall mass excision for metastatic renal cell carcinoma Postoperative status following excision of abdominal wall mass consistent with metastatic clear cell renal cell carcinoma. No new symptoms or complications reported. Mild discomfort possibly related to surgical site. - Monitor surgical site for changes or increase in size. He was advised to call immediately if he has any other concerning symptoms in the interval. Regarding the metastatic renal cell carcinoma, we will continue to  monitor him closely for any disease recurrence. He was advised to call immediately if he has any other concerning symptoms in the interval. The total time spent in the appointment was 30 minutes including review of chart and various tests results, discussions about plan of care and coordination of care plan .  Disclaimer: This note was dictated with voice recognition software. Similar sounding words can inadvertently be transcribed and may not be corrected upon review.

## 2024-05-10 ENCOUNTER — Inpatient Hospital Stay

## 2024-05-17 ENCOUNTER — Inpatient Hospital Stay: Admitting: Internal Medicine
